# Patient Record
Sex: Male | Born: 1940 | Race: White | Hispanic: No | Marital: Married | Smoking: Former smoker
Health system: Southern US, Community
[De-identification: ages and names within clinical notes are randomized; demographics above are authoritative.]

## PROBLEM LIST (undated history)

## (undated) DIAGNOSIS — Z87442 Personal history of urinary calculi: Secondary | ICD-10-CM

## (undated) DIAGNOSIS — K760 Fatty (change of) liver, not elsewhere classified: Secondary | ICD-10-CM

## (undated) DIAGNOSIS — K635 Polyp of colon: Secondary | ICD-10-CM

## (undated) DIAGNOSIS — R0600 Dyspnea, unspecified: Secondary | ICD-10-CM

## (undated) DIAGNOSIS — K7689 Other specified diseases of liver: Secondary | ICD-10-CM

## (undated) DIAGNOSIS — N281 Cyst of kidney, acquired: Secondary | ICD-10-CM

## (undated) DIAGNOSIS — K56609 Unspecified intestinal obstruction, unspecified as to partial versus complete obstruction: Secondary | ICD-10-CM

## (undated) DIAGNOSIS — E042 Nontoxic multinodular goiter: Secondary | ICD-10-CM

## (undated) DIAGNOSIS — K648 Other hemorrhoids: Secondary | ICD-10-CM

## (undated) DIAGNOSIS — Z860101 Personal history of adenomatous and serrated colon polyps: Secondary | ICD-10-CM

## (undated) DIAGNOSIS — R109 Unspecified abdominal pain: Secondary | ICD-10-CM

## (undated) DIAGNOSIS — R600 Localized edema: Secondary | ICD-10-CM

## (undated) DIAGNOSIS — I2510 Atherosclerotic heart disease of native coronary artery without angina pectoris: Secondary | ICD-10-CM

## (undated) DIAGNOSIS — K5090 Crohn's disease, unspecified, without complications: Secondary | ICD-10-CM

## (undated) DIAGNOSIS — N200 Calculus of kidney: Secondary | ICD-10-CM

## (undated) DIAGNOSIS — I471 Supraventricular tachycardia, unspecified: Secondary | ICD-10-CM

## (undated) DIAGNOSIS — D751 Secondary polycythemia: Secondary | ICD-10-CM

## (undated) DIAGNOSIS — M1990 Unspecified osteoarthritis, unspecified site: Secondary | ICD-10-CM

## (undated) DIAGNOSIS — L539 Erythematous condition, unspecified: Secondary | ICD-10-CM

## (undated) DIAGNOSIS — K5790 Diverticulosis of intestine, part unspecified, without perforation or abscess without bleeding: Secondary | ICD-10-CM

## (undated) DIAGNOSIS — E782 Mixed hyperlipidemia: Secondary | ICD-10-CM

## (undated) DIAGNOSIS — D126 Benign neoplasm of colon, unspecified: Secondary | ICD-10-CM

## (undated) DIAGNOSIS — E559 Vitamin D deficiency, unspecified: Secondary | ICD-10-CM

## (undated) DIAGNOSIS — Z86010 Personal history of colonic polyps: Secondary | ICD-10-CM

## (undated) DIAGNOSIS — N476 Balanoposthitis: Secondary | ICD-10-CM

## (undated) DIAGNOSIS — R945 Abnormal results of liver function studies: Secondary | ICD-10-CM

## (undated) DIAGNOSIS — E785 Hyperlipidemia, unspecified: Secondary | ICD-10-CM

## (undated) DIAGNOSIS — I10 Essential (primary) hypertension: Secondary | ICD-10-CM

## (undated) DIAGNOSIS — K551 Chronic vascular disorders of intestine: Secondary | ICD-10-CM

## (undated) DIAGNOSIS — Z8546 Personal history of malignant neoplasm of prostate: Secondary | ICD-10-CM

## (undated) DIAGNOSIS — N183 Chronic kidney disease, stage 3 unspecified: Secondary | ICD-10-CM

## (undated) DIAGNOSIS — C61 Malignant neoplasm of prostate: Secondary | ICD-10-CM

## (undated) HISTORY — PX: RETROPUBIC PROSTATECTOMY: SUR1055

## (undated) HISTORY — PX: COLONOSCOPY WITH PROPOFOL: SHX5780

## (undated) HISTORY — DX: Diverticulosis of intestine, part unspecified, without perforation or abscess without bleeding: K57.90

## (undated) HISTORY — DX: Secondary polycythemia: D75.1

## (undated) HISTORY — DX: Malignant neoplasm of prostate: C61

## (undated) HISTORY — PX: APPENDECTOMY: SHX54

## (undated) HISTORY — DX: Other hemorrhoids: K64.8

## (undated) HISTORY — DX: Unspecified osteoarthritis, unspecified site: M19.90

## (undated) HISTORY — DX: Vitamin D deficiency, unspecified: E55.9

## (undated) HISTORY — DX: Abnormal results of liver function studies: R94.5

## (undated) HISTORY — DX: Fatty (change of) liver, not elsewhere classified: K76.0

## (undated) HISTORY — DX: Unspecified intestinal obstruction, unspecified as to partial versus complete obstruction: K56.609

## (undated) HISTORY — PX: PROSTATECTOMY: SHX69

## (undated) HISTORY — PX: LAPAROSCOPIC ASSISTED VENTRAL HERNIA REPAIR: SHX6312

## (undated) HISTORY — PX: EXPLORATORY LAPAROTOMY W/ BOWEL RESECTION: SHX1544

## (undated) HISTORY — DX: Polyp of colon: K63.5

## (undated) HISTORY — DX: Essential (primary) hypertension: I10

## (undated) HISTORY — PX: OTHER SURGICAL HISTORY: SHX169

## (undated) HISTORY — DX: Hyperlipidemia, unspecified: E78.5

## (undated) HISTORY — PX: VENTRAL HERNIA REPAIR: SHX424

## (undated) HISTORY — DX: Crohn's disease, unspecified, without complications: K50.90

## (undated) HISTORY — DX: Other specified diseases of liver: K76.89

## (undated) HISTORY — DX: Calculus of kidney: N20.0

## (undated) HISTORY — PX: TONSILLECTOMY: SUR1361

---
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
## 1978-10-15 HISTORY — PX: LITHOTRIPSY: SUR834

## 2001-10-15 DIAGNOSIS — Z8719 Personal history of other diseases of the digestive system: Secondary | ICD-10-CM

## 2001-10-15 HISTORY — PX: EXPLORATORY LAPAROTOMY W/ BOWEL RESECTION: SHX1544

## 2001-10-15 HISTORY — PX: APPENDECTOMY: SHX54

## 2001-10-15 HISTORY — DX: Personal history of other diseases of the digestive system: Z87.19

## 2002-05-20 ENCOUNTER — Encounter (INDEPENDENT_AMBULATORY_CARE_PROVIDER_SITE_OTHER): Payer: Self-pay | Admitting: Specialist

## 2002-05-20 ENCOUNTER — Encounter: Payer: Self-pay | Admitting: Internal Medicine

## 2002-05-20 ENCOUNTER — Inpatient Hospital Stay (HOSPITAL_COMMUNITY): Admission: RE | Admit: 2002-05-20 | Discharge: 2002-05-30 | Payer: Self-pay | Admitting: Gastroenterology

## 2002-05-21 ENCOUNTER — Encounter: Payer: Self-pay | Admitting: Gastroenterology

## 2002-05-25 ENCOUNTER — Encounter: Payer: Self-pay | Admitting: General Surgery

## 2002-11-29 ENCOUNTER — Encounter: Payer: Self-pay | Admitting: Internal Medicine

## 2002-11-30 ENCOUNTER — Encounter: Payer: Self-pay | Admitting: Surgery

## 2002-11-30 ENCOUNTER — Ambulatory Visit (HOSPITAL_COMMUNITY): Admission: RE | Admit: 2002-11-30 | Discharge: 2002-11-30 | Payer: Self-pay | Admitting: Surgery

## 2002-11-30 ENCOUNTER — Encounter: Payer: Self-pay | Admitting: Internal Medicine

## 2003-05-10 ENCOUNTER — Encounter: Payer: Self-pay | Admitting: Surgery

## 2003-05-13 ENCOUNTER — Inpatient Hospital Stay (HOSPITAL_COMMUNITY): Admission: RE | Admit: 2003-05-13 | Discharge: 2003-05-14 | Payer: Self-pay | Admitting: Surgery

## 2004-11-16 ENCOUNTER — Inpatient Hospital Stay (HOSPITAL_COMMUNITY): Admission: RE | Admit: 2004-11-16 | Discharge: 2004-11-19 | Payer: Self-pay | Admitting: Urology

## 2004-11-16 ENCOUNTER — Encounter (INDEPENDENT_AMBULATORY_CARE_PROVIDER_SITE_OTHER): Payer: Self-pay | Admitting: Specialist

## 2005-07-16 ENCOUNTER — Ambulatory Visit: Payer: Self-pay | Admitting: Internal Medicine

## 2006-07-01 ENCOUNTER — Ambulatory Visit: Payer: Self-pay | Admitting: Internal Medicine

## 2007-08-26 ENCOUNTER — Ambulatory Visit: Payer: Self-pay | Admitting: Internal Medicine

## 2007-08-26 LAB — CONVERTED CEMR LAB
Basophils Absolute: 0 10*3/uL (ref 0.0–0.1)
Eosinophils Absolute: 0.1 10*3/uL (ref 0.0–0.6)
Eosinophils Relative: 1.3 % (ref 0.0–5.0)
HCT: 37.8 % — ABNORMAL LOW (ref 39.0–52.0)
Lymphocytes Relative: 18.1 % (ref 12.0–46.0)
MCHC: 34.8 g/dL (ref 30.0–36.0)
MCV: 109.3 fL — ABNORMAL HIGH (ref 78.0–100.0)
Monocytes Relative: 6.8 % (ref 3.0–11.0)
Neutrophils Relative %: 72.9 % (ref 43.0–77.0)
Platelets: 203 10*3/uL (ref 150–400)
RBC: 3.46 M/uL — ABNORMAL LOW (ref 4.22–5.81)
RDW: 17.9 % — ABNORMAL HIGH (ref 11.5–14.6)

## 2008-01-14 ENCOUNTER — Encounter: Admission: RE | Admit: 2008-01-14 | Discharge: 2008-01-14 | Payer: Self-pay | Admitting: General Surgery

## 2008-01-14 ENCOUNTER — Encounter (INDEPENDENT_AMBULATORY_CARE_PROVIDER_SITE_OTHER): Payer: Self-pay | Admitting: *Deleted

## 2008-02-06 ENCOUNTER — Ambulatory Visit: Payer: Self-pay | Admitting: Internal Medicine

## 2008-02-11 ENCOUNTER — Encounter (INDEPENDENT_AMBULATORY_CARE_PROVIDER_SITE_OTHER): Payer: Self-pay | Admitting: *Deleted

## 2008-02-12 ENCOUNTER — Inpatient Hospital Stay (HOSPITAL_COMMUNITY): Admission: AD | Admit: 2008-02-12 | Discharge: 2008-02-13 | Payer: Self-pay | Admitting: Surgery

## 2008-02-26 ENCOUNTER — Telehealth: Payer: Self-pay | Admitting: Internal Medicine

## 2008-05-17 ENCOUNTER — Encounter: Payer: Self-pay | Admitting: Internal Medicine

## 2008-05-20 ENCOUNTER — Encounter: Payer: Self-pay | Admitting: Internal Medicine

## 2008-05-20 ENCOUNTER — Telehealth: Payer: Self-pay | Admitting: Internal Medicine

## 2008-05-31 ENCOUNTER — Ambulatory Visit: Payer: Self-pay | Admitting: Internal Medicine

## 2008-06-02 LAB — CONVERTED CEMR LAB
Bilirubin, Direct: 0.2 mg/dL (ref 0.0–0.3)
Total Bilirubin: 1.5 mg/dL — ABNORMAL HIGH (ref 0.3–1.2)
Total Protein: 6.4 g/dL (ref 6.0–8.3)

## 2008-07-15 ENCOUNTER — Encounter: Admission: RE | Admit: 2008-07-15 | Discharge: 2008-07-15 | Payer: Self-pay | Admitting: Surgery

## 2008-07-15 ENCOUNTER — Encounter: Payer: Self-pay | Admitting: Internal Medicine

## 2008-07-28 DIAGNOSIS — K5730 Diverticulosis of large intestine without perforation or abscess without bleeding: Secondary | ICD-10-CM | POA: Insufficient documentation

## 2008-07-28 DIAGNOSIS — Z8719 Personal history of other diseases of the digestive system: Secondary | ICD-10-CM | POA: Insufficient documentation

## 2008-07-28 DIAGNOSIS — M109 Gout, unspecified: Secondary | ICD-10-CM | POA: Insufficient documentation

## 2008-07-28 DIAGNOSIS — Z8546 Personal history of malignant neoplasm of prostate: Secondary | ICD-10-CM | POA: Insufficient documentation

## 2008-07-28 DIAGNOSIS — I10 Essential (primary) hypertension: Secondary | ICD-10-CM | POA: Insufficient documentation

## 2008-07-28 DIAGNOSIS — E785 Hyperlipidemia, unspecified: Secondary | ICD-10-CM | POA: Insufficient documentation

## 2008-07-28 DIAGNOSIS — K5090 Crohn's disease, unspecified, without complications: Secondary | ICD-10-CM | POA: Insufficient documentation

## 2008-07-28 DIAGNOSIS — Z87442 Personal history of urinary calculi: Secondary | ICD-10-CM | POA: Insufficient documentation

## 2008-07-28 DIAGNOSIS — C61 Malignant neoplasm of prostate: Secondary | ICD-10-CM

## 2008-08-03 ENCOUNTER — Ambulatory Visit: Payer: Self-pay | Admitting: Internal Medicine

## 2008-08-10 LAB — CONVERTED CEMR LAB
AST: 44 units/L — ABNORMAL HIGH (ref 0–37)
Monocytes Relative: 11.2 % (ref 3.0–12.0)
Neutro Abs: 6 10*3/uL (ref 1.4–7.7)
Neutrophils Relative %: 71.3 % (ref 43.0–77.0)
RBC: 4.99 M/uL (ref 4.22–5.81)
Total Protein: 7 g/dL (ref 6.0–8.3)

## 2008-09-03 ENCOUNTER — Ambulatory Visit: Payer: Self-pay | Admitting: Internal Medicine

## 2008-09-03 LAB — CONVERTED CEMR LAB
AST: 48 units/L — ABNORMAL HIGH (ref 0–37)
Albumin: 3.5 g/dL (ref 3.5–5.2)
Bilirubin, Direct: 0.2 mg/dL (ref 0.0–0.3)
Eosinophils Absolute: 0.1 10*3/uL (ref 0.0–0.7)
HCT: 49.7 % (ref 39.0–52.0)
Lymphocytes Relative: 20.4 % (ref 12.0–46.0)
MCHC: 34.4 g/dL (ref 30.0–36.0)
MCV: 98.5 fL (ref 78.0–100.0)
Monocytes Relative: 10.8 % (ref 3.0–12.0)
Neutro Abs: 4 10*3/uL (ref 1.4–7.7)
Platelets: 129 10*3/uL — ABNORMAL LOW (ref 150–400)
RBC: 5.05 M/uL (ref 4.22–5.81)
Total Bilirubin: 1.6 mg/dL — ABNORMAL HIGH (ref 0.3–1.2)

## 2008-09-06 ENCOUNTER — Ambulatory Visit: Payer: Self-pay | Admitting: Internal Medicine

## 2008-09-22 ENCOUNTER — Ambulatory Visit: Payer: Self-pay | Admitting: Internal Medicine

## 2008-09-22 ENCOUNTER — Encounter: Payer: Self-pay | Admitting: Internal Medicine

## 2008-09-24 DIAGNOSIS — Z862 Personal history of diseases of the blood and blood-forming organs and certain disorders involving the immune mechanism: Secondary | ICD-10-CM | POA: Insufficient documentation

## 2008-09-24 DIAGNOSIS — Z8639 Personal history of other endocrine, nutritional and metabolic disease: Secondary | ICD-10-CM

## 2008-09-24 LAB — CONVERTED CEMR LAB
ALT: 39 units/L (ref 0–53)
AST: 42 units/L — ABNORMAL HIGH (ref 0–37)
Albumin: 3.6 g/dL (ref 3.5–5.2)
Total Protein: 7.4 g/dL (ref 6.0–8.3)

## 2008-09-29 ENCOUNTER — Ambulatory Visit (HOSPITAL_COMMUNITY): Admission: RE | Admit: 2008-09-29 | Discharge: 2008-09-29 | Payer: Self-pay | Admitting: Internal Medicine

## 2008-09-30 ENCOUNTER — Encounter: Payer: Self-pay | Admitting: Internal Medicine

## 2008-10-05 ENCOUNTER — Encounter: Payer: Self-pay | Admitting: Internal Medicine

## 2008-11-03 ENCOUNTER — Ambulatory Visit: Payer: Self-pay | Admitting: Internal Medicine

## 2008-11-03 DIAGNOSIS — D751 Secondary polycythemia: Secondary | ICD-10-CM | POA: Insufficient documentation

## 2008-11-29 ENCOUNTER — Encounter: Payer: Self-pay | Admitting: Internal Medicine

## 2008-12-16 ENCOUNTER — Ambulatory Visit: Payer: Self-pay | Admitting: Internal Medicine

## 2008-12-17 LAB — CONVERTED CEMR LAB
ALT: 32 units/L (ref 0–53)
Albumin: 3.4 g/dL — ABNORMAL LOW (ref 3.5–5.2)
Bilirubin, Direct: 0.2 mg/dL (ref 0.0–0.3)
Total Protein: 6.6 g/dL (ref 6.0–8.3)

## 2009-03-22 ENCOUNTER — Ambulatory Visit: Payer: Self-pay | Admitting: Internal Medicine

## 2009-04-26 ENCOUNTER — Ambulatory Visit: Payer: Self-pay | Admitting: Internal Medicine

## 2009-06-07 ENCOUNTER — Encounter: Payer: Self-pay | Admitting: Cardiology

## 2009-07-15 LAB — HM COLONOSCOPY

## 2009-08-01 ENCOUNTER — Ambulatory Visit: Payer: Self-pay | Admitting: Internal Medicine

## 2009-08-01 ENCOUNTER — Ambulatory Visit (HOSPITAL_COMMUNITY): Admission: RE | Admit: 2009-08-01 | Discharge: 2009-08-01 | Payer: Self-pay | Admitting: Internal Medicine

## 2009-08-01 ENCOUNTER — Ambulatory Visit: Payer: Self-pay

## 2009-08-01 ENCOUNTER — Encounter: Payer: Self-pay | Admitting: Family Medicine

## 2009-09-28 ENCOUNTER — Ambulatory Visit: Payer: Self-pay | Admitting: Internal Medicine

## 2009-09-28 LAB — CONVERTED CEMR LAB
ALT: 23 units/L (ref 0–53)
AST: 31 units/L (ref 0–37)
Albumin: 3 g/dL — ABNORMAL LOW (ref 3.5–5.2)
Alkaline Phosphatase: 50 units/L (ref 39–117)
Bilirubin, Direct: 0.3 mg/dL (ref 0.0–0.3)
Total Bilirubin: 2 mg/dL — ABNORMAL HIGH (ref 0.3–1.2)

## 2009-09-30 LAB — CONVERTED CEMR LAB
Alkaline Phosphatase: 50 units/L (ref 39–117)
Total Bilirubin: 1.9 mg/dL — ABNORMAL HIGH (ref 0.3–1.2)

## 2009-10-19 ENCOUNTER — Encounter: Payer: Self-pay | Admitting: Cardiology

## 2009-11-02 ENCOUNTER — Encounter: Payer: Self-pay | Admitting: Internal Medicine

## 2009-11-11 ENCOUNTER — Telehealth: Payer: Self-pay | Admitting: Internal Medicine

## 2009-11-28 ENCOUNTER — Ambulatory Visit: Payer: Self-pay | Admitting: Internal Medicine

## 2009-12-02 ENCOUNTER — Ambulatory Visit: Payer: Self-pay | Admitting: Cardiology

## 2010-03-07 ENCOUNTER — Encounter: Payer: Self-pay | Admitting: Internal Medicine

## 2010-03-13 IMAGING — CT CT ABD-PELV W/O CM
2 of 4 series · 17 of 46 positions shown, 19 images · non-contrast
Comparison: 07/15/2008

CLINICAL DATA: Crohn's disease.  History of small bowel
obstruction.  Renal insufficiency.

CT ABDOMEN AND PELVIS WITHOUT CONTRAST
TECHNIQUE: Multidetector CT imaging of the abdomen and pelvis was
performed following the standard protocol without intravenous
contrast.

[Series 2: ap without · axial · non-contrast · 0.90mm/px · z∈[-461,-1]mm · 14 of 102 slices shown, 16 images]
[im 5/102  soft-tissue]
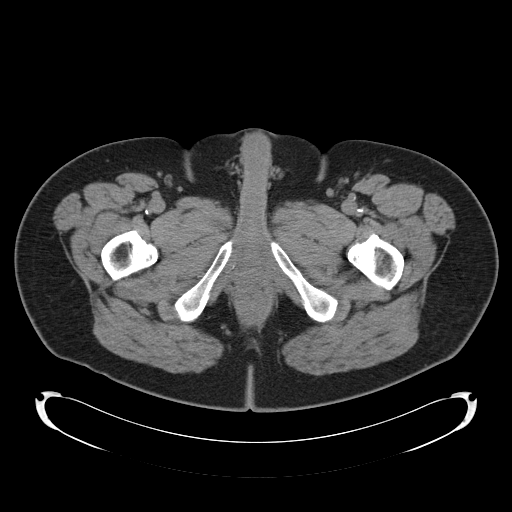
[im 5/102  bone]
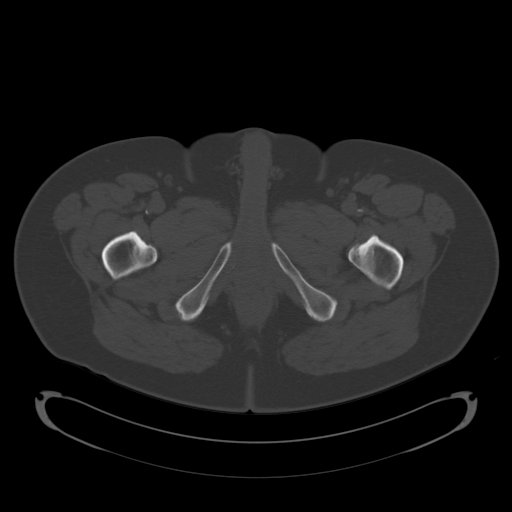
[im 14/102  soft-tissue]
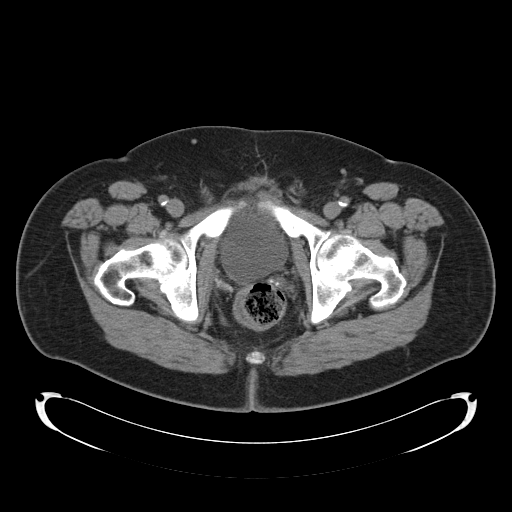
[im 18/102  soft-tissue]
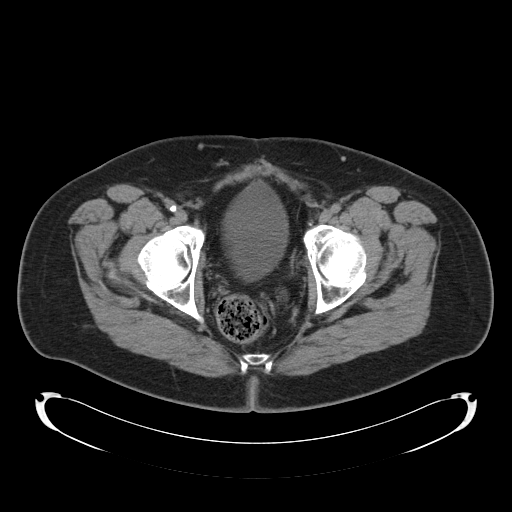
[im 27/102  soft-tissue]
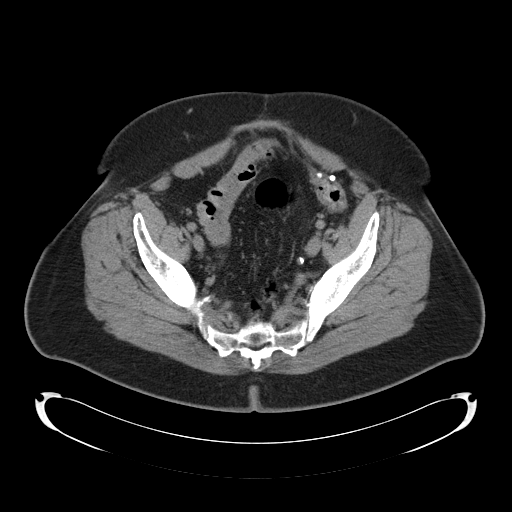
[im 36/102  soft-tissue]
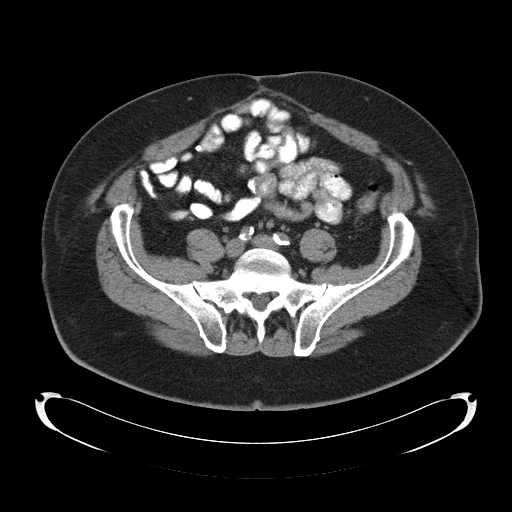
[im 40/102  soft-tissue]
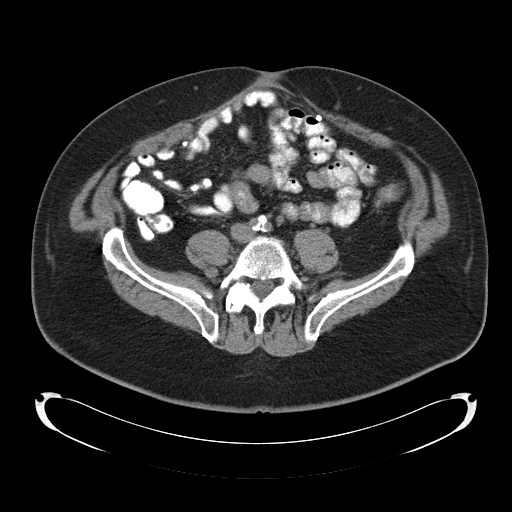
[im 49/102  soft-tissue]
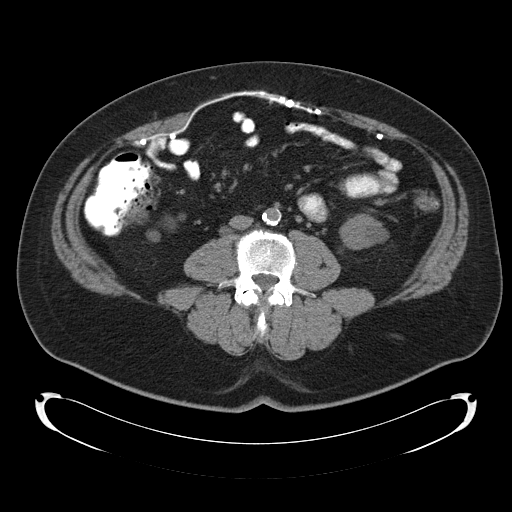
[im 53/102  soft-tissue]
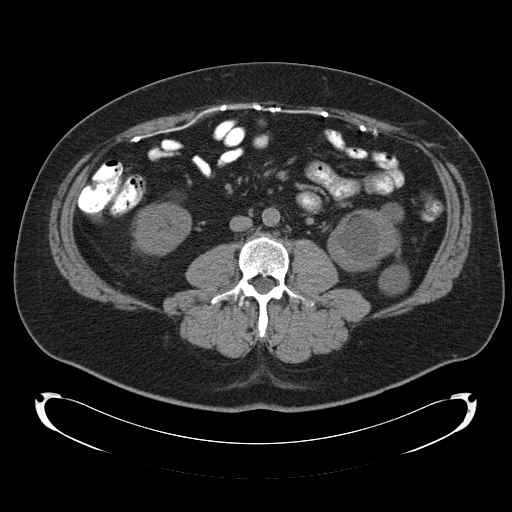
[im 62/102  soft-tissue]
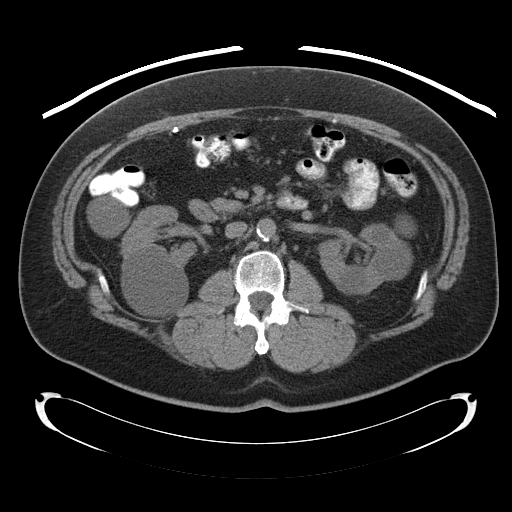
[im 62/102  bone]
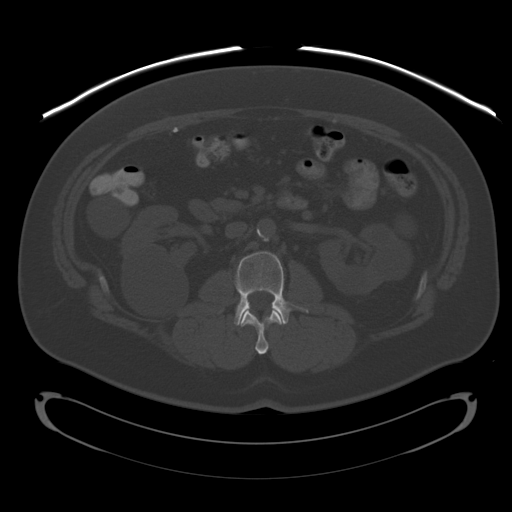
[im 66/102  soft-tissue]
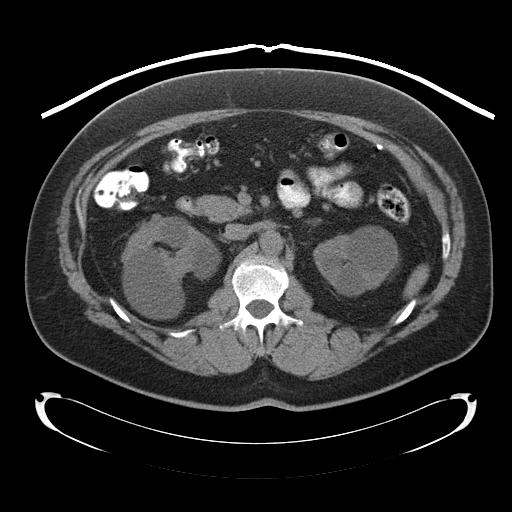
[im 75/102  soft-tissue]
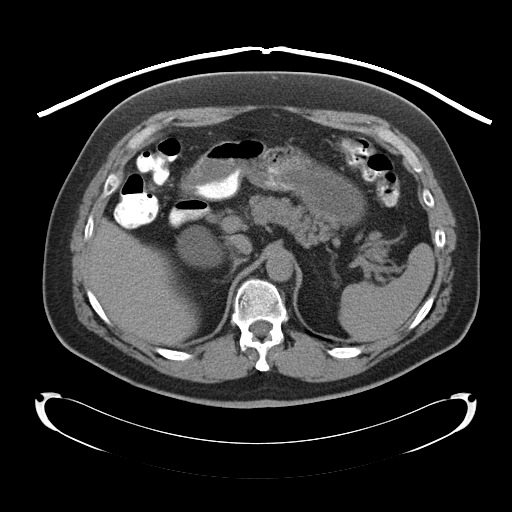
[im 84/102  soft-tissue]
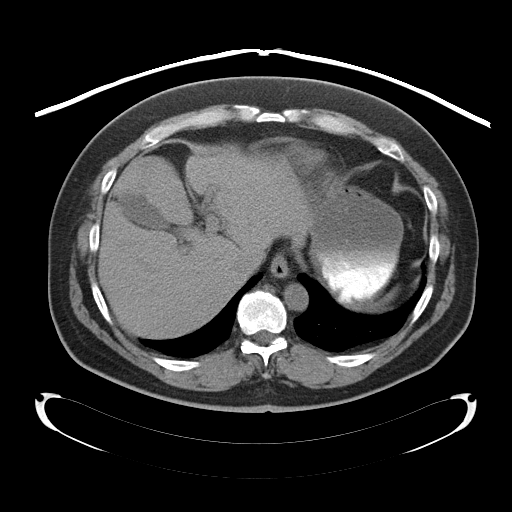
[im 88/102  soft-tissue]
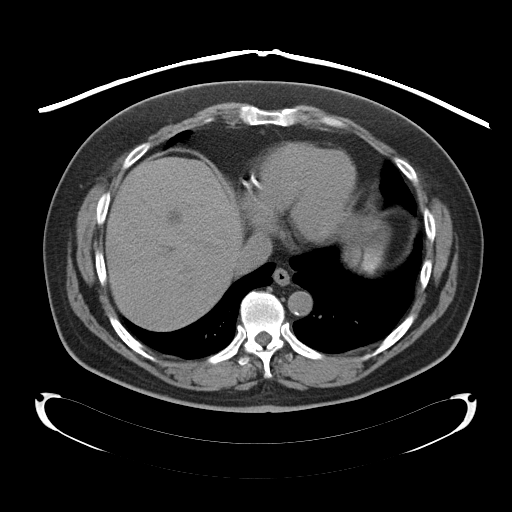
[im 97/102  soft-tissue]
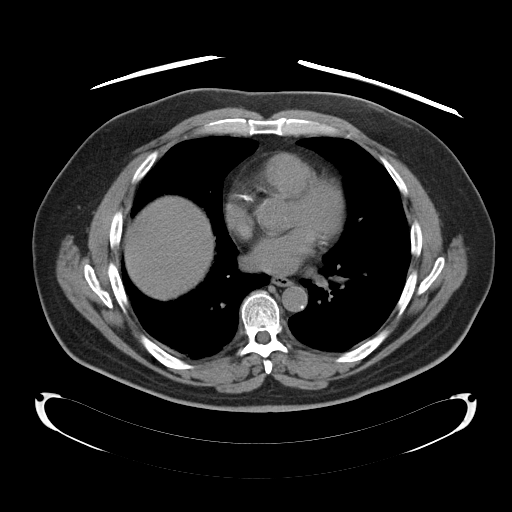

[Series 602: <mpr range> · coronal · 1.02mm/px · 3 of 168 slices shown]
[im 56/168  soft-tissue]
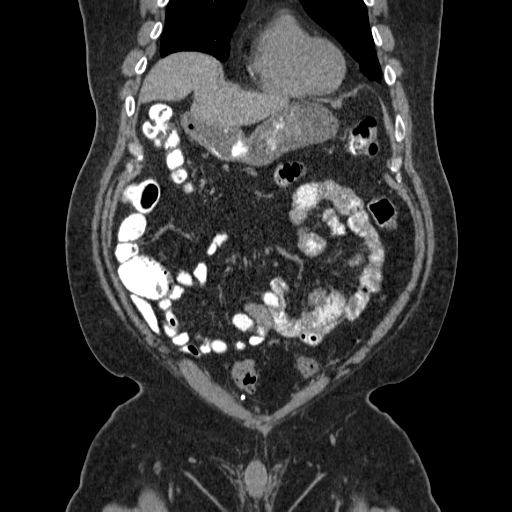
[im 75/168  soft-tissue]
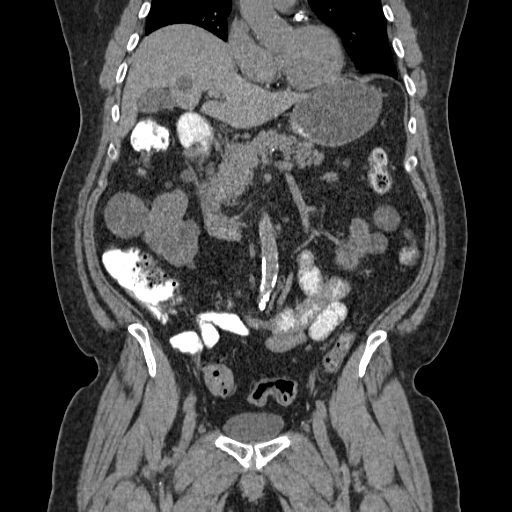
[im 93/168  soft-tissue]
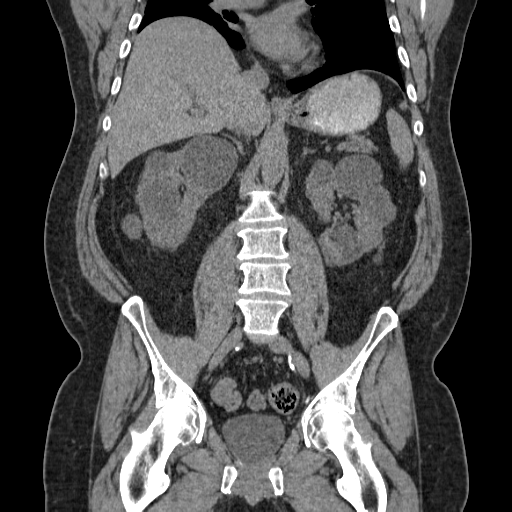

[17 of 46 positions shown; findings below may reference images not displayed]

FINDINGS: Two small liver cysts are unchanged.  The stomach,
duodenum, the pancreas, and adrenal glands are normal.  Tiny
calcified stone is seen in the fundus of the gallbladder.  Multiple
bilateral renal cysts are again identified without substantial
change.

No abdominal aortic aneurysm.  Ventral mesh is unchanged.  No free
fluid in the peritoneal cavity.  No intraperitoneal or
retroperitoneal lymphadenopathy.  No evidence for bowel
obstruction.

Despite the reported history of Crohn's disease, there is no
evidence for small bowel wall thickening.  The colon is
unremarkable aside from some scattered diverticulosis in the
sigmoid segment.

Bone windows reveal no worrisome lytic or sclerotic osseous
lesions.
IMPRESSION: Stable exam without new or acute interval findings.  Cysts are
identified in the liver and in both kidneys.  No bowel obstruction
or abnormal bowel wall thickening.  No intraperitoneal free fluid
or mesenteric edema.

## 2010-03-15 ENCOUNTER — Telehealth: Payer: Self-pay | Admitting: Internal Medicine

## 2010-03-27 ENCOUNTER — Telehealth: Payer: Self-pay | Admitting: Internal Medicine

## 2010-03-31 ENCOUNTER — Encounter (INDEPENDENT_AMBULATORY_CARE_PROVIDER_SITE_OTHER): Payer: Self-pay | Admitting: *Deleted

## 2010-05-04 ENCOUNTER — Ambulatory Visit: Payer: Self-pay | Admitting: Internal Medicine

## 2010-08-09 ENCOUNTER — Encounter: Admission: RE | Admit: 2010-08-09 | Discharge: 2010-08-09 | Payer: Self-pay | Admitting: General Surgery

## 2010-08-09 ENCOUNTER — Other Ambulatory Visit: Admission: RE | Admit: 2010-08-09 | Discharge: 2010-08-09 | Payer: Self-pay | Admitting: Diagnostic Radiology

## 2010-11-14 NOTE — Progress Notes (Signed)
Summary: TRIAGE-Abd. Pain   Phone Note Call from Patient Call back at [facility] Phone [phone]   Caller: Patient Call For: Dr. Olevia Perches Summary of Call: Pt is having a flare-up with his Crohn's. Initial call taken by: Webb Laws,  November 11, 2009 1:08 PM  Follow-up for Phone Call        On Wednesday he had an episode of severe abd. pain, mild pain today, constipation-no BM since Wed., low grade temp, but not today, some nausea and vomited x1 on wed. Denies blood. Takes 54m 25 mg daily.  Takes Hydrocodone as needed and started Cipro 2569mtwo times a day on Wednesday.   1) Full liquids x24 hours. Advance to soft,bland diet x2-4 days. Advanced as tolerated. 2) Continue above meds, try to alternate Hydrocodone w/ Tylenol 3) Milk of magnesia now. Glass of warm prune juice at bedtime.If no BM by the morning, then repeat dose of MOM. 4) Heating pad to abdomen as needed. 5) F/U w/Dr.Jodine Muchmore on 11-28-09 at 10am 6) If symptoms become worse call back immediately or go to ER. 7) I will call pt., if new orders, after MD reviews.    Follow-up by: DeVivia EwingPN,  January 28, 207681:56 PM  Additional Follow-up for Phone Call Additional follow up Details #1::        reviewed and agree. Cipro 250 mg by mouth two times a day. Additional Follow-up by: DoLafayette DragonD,  November 11, 2009 9:57 PM

## 2010-11-14 NOTE — Progress Notes (Signed)
Summary: VB12   ---- Converted from flag ---- ---- 03/15/2010 1:21 PM, Lafayette Dragon MD wrote: Hilda Blades, Mr Barcomb's B12 is  at the low limits of normal. Please , make sure he has been taking his B12 monthly. He should go weekly x 4 weeks, then monthly. Repeat 6 months. ------------------------------  Phone Note Outgoing Call Call back at [facility] Phone [phone]   Call placed by: Vivia Ewing LPN,  March 15, 7182 6:72 PM Call placed to: Patient Summary of Call: Above MD orders reviewed with patient.  Pt. wants to get VB12 injections at [facility] office [place]. I have spoken with Dr.Moore's nurse, Joaquim Lai, and given her the instructions by phone. Pt. advised to go to the office tomorrow for his 1st shot. Pt. instructed to call back as needed.  Initial call taken by: Vivia Ewing LPN,  March 15, 5499 1:64 PM

## 2010-11-14 NOTE — Letter (Signed)
Summary: Office Visit Letter  [facility]  [address] [place], [place] [address]   Phone: [phone]  Fax: [phone]      March 31, 2010 MRN: [id number]   STEWARD SAMES [address] [place] [place], [place]  [address]   Dear Mr. Guldin,   According to our records, it is time for you to schedule a follow-up office visit with [place].   At your convenience, please call [phone] (option #2)to schedule an office visit. If you have any questions, concerns, or feel that this letter is in error, we would appreciate your call.   Sincerely,  Lowella Bandy. Olevia Perches, M.D.  [facility] [phone]

## 2010-11-14 NOTE — Assessment & Plan Note (Signed)
Summary: annual f/u...as.    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 Biopsies of the terminal ileum showed ileitis. He has intermittent partial small bowel obstructions which occur every 3-4 months. They usually resolve with one hydrocodone and bowel rest. A CT scan of the abdomen and pelvis in February 2011 did not show any signs of obstruction. He has been on a low residue diet. His vitamin B12 level was low at 242 on May 2011 on blood work from Dr. Laurance Flatten. He has been on B12 supplements 1000 mcg IM monthly and we are planning to recheck the B12 level in 6 months. He is on 6MP 25 mg daily. His hemoglobin has been normal but his platelet count has been low at 113,000. He has a white cell count of 5300 most likely related to the 6 MP. He has 2 loose bowel movements a day. His weight has steadily increased. He is followed for prostate cancer by Dr.Wrenn.   GI Review of Systems    Reports abdominal pain.      Denies acid reflux, belching, bloating, chest pain, dysphagia with liquids, dysphagia with solids, heartburn, loss of appetite, nausea, vomiting, vomiting blood, weight loss, and  weight gain.        Denies anal fissure, black tarry stools, change in bowel habit, constipation, diarrhea, diverticulosis, fecal incontinence, heme positive stool, hemorrhoids, irritable bowel syndrome, jaundice, light color stool, liver problems, rectal bleeding, and  rectal pain.    Current Medications (verified): 1)  Benicar Hct 40-12.5 Mg Tabs (Olmesartan Medoxomil-Hctz) .... Take 1 Tablet By Mouth Once A  Day 2)  Aspirin 81 Mg Tbec (Aspirin) .... Take 1 Tablet By Mouth Once A Day 3)  Lipitor 10 Mg Tabs (Atorvastatin Calcium) .... One Tablet By Mouth Once Daily 4)  Mercaptopurine 50 Mg Tabs (Mercaptopurine) .... Take 1/2  Tablet By Mouth Once A Day 5)  Hydrocodone-Acetaminophen 10-650 Mg Tabs (Hydrocodone-Acetaminophen) .... Take 1 Tablet By Mouth Eveyr 4-6 Hours As Needed For Severe Pain 6)  Fish Oil  Oil (Fish Oil) .... 4000 Units Per Day    2 in The Am and 2 in The Pm 7)  Vitamin D 1000 Unit Tabs (Cholecalciferol) .Marland Kitchen.. 1 By Mouth Once Daily 8)  Cyanocobalamin 1000 Mcg/ml Soln (Cyanocobalamin) .Marland Kitchen.. 1 Injection Monthy  Allergies (verified): 1)  ! * Colchicine  Past History:  Past Medical History: Last updated: 07/28/2008 Current Problems:  PROSTATE CANCER, PERSONAL HX (ICD-V10.46) HYPERLIPIDEMIA (ICD-272.4) HYPERTENSION (ICD-401.9) DIVERTICULOSIS, COLON (ICD-562.10) NEPHROLITHIASIS, HX OF (ICD-V13.01) GOUT, UNSPECIFIED (ICD-274.9) SMALL BOWEL OBSTRUCTION, HX OF (ICD-V12.79) CROHN'S DISEASE (ICD-555.9)  Past Surgical History: Last updated: 07/28/2008 Tonsillectomy Ventral Hernia Repair Lithotripsy exploratory laparotomy with small bowel resection  Family History: Last updated: 04/26/2009 Family History of Heart Disease: Mother, Father, Maternal Uncle Family History of Cervical Cancer: Mother No FH of Colon Cancer:  Social History: Last updated: 11/03/2008 Patient is a former smoker.  Alcohol Use - no Illicit Drug Use - no Patient gets regular exercise.  Review of Systems       The patient complains of arthritis/joint pain.  The patient denies allergy/sinus, anemia, anxiety-new,  back pain, blood in urine, breast changes/lumps, change in vision, confusion, cough, coughing up blood, depression-new, fainting, fatigue, fever, headaches-new, hearing problems, heart murmur, heart rhythm changes, itching, menstrual pain, muscle pains/cramps, night sweats, nosebleeds, pregnancy  symptoms, shortness of breath, skin rash, sleeping problems, sore throat, swelling of feet/legs, swollen lymph glands, thirst - excessive , urination - excessive , urination changes/pain, urine leakage, vision changes, and voice change.         Pertinent positive and negative review of systems were noted in the above HPI. All other ROS was otherwise negative.   Vital Signs:  Patient profile:   70 year old male Height:      69 inches Weight:      235 pounds BMI:     34.83 BSA:     2.21 Pulse rate:   76 / minute Pulse rhythm:   regular BP sitting:   124 / 64  (left arm)  Vitals Entered By: [place] ([place]) (May 04, 2010 8:30 AM)  Physical Exam  General:  Alert, oriented and overweight. Eyes:  Nonicteric. Neck:  Supple; no masses or thyromegaly. Lungs:  Clear throughout to auscultation. Heart:  Regular rate and rhythm; no murmurs, rubs,  or bruits. Abdomen:  soft obese abdomen which is nontender and has normoactive bowel sounds. There is a well-healed vertical surgical scar below the umbilicus. There is no fluid wave. No palpable mass. Extremities:  No clubbing, cyanosis, edema or deformities noted. Skin:  Intact without significant lesions or rashes.   Impression & Recommendations:  Problem # 1:  CROHN'S DISEASE (ICD-555.9) Patient has Crohn's disease at the ileocolic anastomosis and is status post terminal ileal resection in 2003. He has episodes of selflimited  partial small bowel obstructions which usually resolve spontaneously without radiographic evidence of obstruction. He will continue on 6-MP and a low-residue diet. His blood tests have been monitored by Dr. Laurance Flatten. I will see him in 6 months.  Problem # 2:  LIVER FUNCTION TESTS, ABNORMAL, HX OF (ICD-V12.2) Patient's last liver function tests were normal with the exception of a mild elevation of total bilirubin at 1.4.His liver looked normal on recent CT scan, also  spleen was normal size. 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 He is followed by Dr.Wrenn.  Problem # 4:  DIVERTICULOSIS, COLON (ICD-562.10) Patient will be due for a recall colonoscopy in December 2012.  Patient Instructions: 1)  Continue 6 MP 25 mg daily. 2)  Low residue diet. 3)  Recall colonoscopy, December 2013. 4)  Continue Vitamin B12 1,000 mcg IM monthly then we will repeat B12 levels in January 2012. 5)  Followup with Dr. Laurance Flatten for general medical care and blood tests. 6)  Copy sent to : Dr Morrie Sheldon 7)  The medication list was reviewed and reconciled.  All changed / newly prescribed medications were explained.  A complete medication list was provided to the patient / caregiver.

## 2010-11-14 NOTE — Progress Notes (Signed)
Summary: med ?'s   Phone Note Call from Patient Call back at [facility] Phone [phone]   Caller: Patient Call For: Dr. Olevia Perches Reason for Call: Talk to Nurse Summary of Call: questions regarding meds for GOUT Initial call taken by: Lucien Mons,  March 27, 2010 12:03 PM  Follow-up for Phone Call        Patient has been advised that he should follow up with his primary care physician regarding his gout. Dr Kizzie Furnish prescribed indomethacin as an alternative for allopurinol since it interferes with 38m. However, he needs to discuss his gout with his PCP and advise him of the interaction potential for allopurinol and 663mand let him know that indomethacin should be okay for him to take. I have explained to him that his PCP would probably like to follow up on his uric acid count etc. Patient states he will do that. Follow-up by: DoMadlyn FrankelMA (ADeborra Medina  March 27, 2010 12:33 PM

## 2010-11-14 NOTE — Assessment & Plan Note (Signed)
Summary: F/U FROM CROHN'S FLARE              [place]    History of Present Illness Visit Type: follow up  Primary GI MD: [name] MD Primary Provider: Redge Gainer, MD Requesting Provider: n/a Chief Complaint: F/u from crohns flare. Pt denies any GI complaints  History of Present Illness:   This is a 70 year old white male with Crohn's disease at the ileocolic anastomosis. He is status post terminal ileal resection in 2003 for an obstruction. His last colonoscopy was in December 2007. He has had an episode of severe abdominal pain which lasted 12 hours and resolved spontaneously. He had one episode of vomiting and did not have any bowel movements for several days. following the acute episode. He is now back to normal. There were 2 similar episodes in the last year. Patient is status post ventral hernia repair in April 2009 with placement of mesh. The biopsies from the terminal ileum showed ileitis.   GI Review of Systems      Denies abdominal pain, acid reflux, belching, bloating, chest pain, dysphagia with liquids, dysphagia with solids, heartburn, loss of appetite, nausea, vomiting, vomiting blood, weight loss, and  weight gain.        Denies anal fissure, black tarry stools, change in bowel habit, constipation, diarrhea, diverticulosis, fecal incontinence, heme positive stool, hemorrhoids, irritable bowel syndrome, jaundice, light color stool, liver problems, rectal bleeding, and  rectal pain.    Current Medications (verified): 1)  Benicar Hct 40-12.5 Mg Tabs (Olmesartan Medoxomil-Hctz) .... Take 1 Tablet By Mouth Once A Day 2)  Aspirin 81 Mg Tbec (Aspirin) .... Take 1 Tablet By Mouth Once A Day 3)  Lipitor 10 Mg Tabs (Atorvastatin Calcium) .... One Tablet By Mouth Once Daily 4)  Mercaptopurine 50 Mg Tabs (Mercaptopurine) .... Take 1/2 Tablet By Mouth Once Daily  Allergies (verified): 1)  ! * Colchicine  Past History:  Past Medical History: Reviewed history from 07/28/2008  and no changes required. Current Problems:  PROSTATE CANCER, PERSONAL HX (ICD-V10.46) HYPERLIPIDEMIA (ICD-272.4) HYPERTENSION (ICD-401.9) DIVERTICULOSIS, COLON (ICD-562.10) NEPHROLITHIASIS, HX OF (ICD-V13.01) GOUT, UNSPECIFIED (ICD-274.9) SMALL BOWEL OBSTRUCTION, HX OF (ICD-V12.79) CROHN'S DISEASE (ICD-555.9)  Past Surgical History: Reviewed history from 07/28/2008 and no changes required. Tonsillectomy Ventral Hernia Repair Lithotripsy exploratory laparotomy with small bowel resection  Family History: Reviewed history from 04/26/2009 and no changes required. Family History of Heart Disease: Mother, Father, Maternal Uncle Family History of Cervical Cancer: Mother No FH of Colon Cancer:  Social History: Reviewed history from 11/03/2008 and no changes required. Patient is a former smoker.  Alcohol Use - no Illicit Drug Use - no Patient gets regular exercise.  Review of Systems  The patient denies allergy/sinus, anemia, anxiety-new, arthritis/joint pain, back pain, blood in urine, breast changes/lumps, change in vision, confusion, cough, coughing up blood, depression-new, fainting, fatigue, fever, headaches-new, hearing problems, heart murmur, heart rhythm changes, itching, muscle pains/cramps, night sweats, nosebleeds, shortness of breath, skin rash, sleeping problems, sore throat, swelling of feet/legs, swollen lymph glands, thirst - excessive, urination - excessive, urination changes/pain, urine leakage, vision changes, and voice change.         Pertinent positive and negative review of systems were noted in the above HPI. All other ROS was otherwise negative.   Vital Signs:  Patient profile:   70 year old male Height:      69 inches Weight:      227 pounds BMI:     33.64 BSA:  2.18 Pulse rate:   64 / minute Pulse rhythm:   regular BP sitting:   124 / 72  (left arm) Cuff size:   regular  Vitals Entered By: [place] (November 28, 2009 9:45 AM)  Physical  Exam  General:  mildly overweight, alert, oriented. Eyes:  PERRLA, no icterus. Mouth:  No deformity or lesions, dentition normal. Neck:  Supple; no masses or thyromegaly. Lungs:  Clear throughout to auscultation. Heart:  Regular rate and rhythm; no murmurs, rubs,  or bruits. Abdomen:  soft obese abdomen with minimal tenderness around the umbilical area and well-healed surgical scar with minimal weakening of the abdominal wall but no definite hernia. Bowel sounds are normoactive. Liver edge at costal margin. Extremities:  No clubbing, cyanosis, edema or deformities noted. Skin:  Intact without significant lesions or rashes. Psych:  Alert and cooperative. Normal mood and affect.   Impression & Recommendations:  Problem # 1:  LIVER FUNCTION TESTS, ABNORMAL, HX OF (ICD-V12.2) Patient had normal liver function tests as of January 21st of this year.  Problem # 2:  SMALL BOWEL OBSTRUCTION, HX OF (ICD-V12.79) Patient's abdominal pain is most likely due to a partial small bowel obstruction. This was a third episode in the last year. It resolved spontaneously. I have instructed the patient on a low fiber diet. He is to use small frequent feedings and avoid high roughage foods. He is due for a repeat colonoscopy in December 2012.Obtain CT scan of the abdomen.  Problem # 3:  CROHN'S DISEASE (ICD-555.9) Patient has an active disease at the ileocolic anastomosis. Patient will continue on 6-MP 50 mg daily and a low residue diet. We will do a CT Scan of the abdomen and pelvis to look for Crohn's disease. Orders: TLB-BUN (Urea Nitrogen) ([id number]-BUN) TLB-Creatinine, Blood ([id number]-CREA)  Patient Instructions: 1)  Renal function today to repeat creatinine which was 1.6 to see if we can use IV contrast. 2)  CT Scan of the abdomen and pelvis to rule out Crohn's disease or small bowel obstruction. 3)  Low residue diet with small frequent feedings. 4)  Continue 6 MP 50 mg daily. 5)  Refill on his pain  medications to use p.r.n. for bowel obstruction. 6)  Copy sent to : Dr Morrie Sheldon 7)  The medication list was reviewed and reconciled.  All changed / newly prescribed medications were explained.  A complete medication list was provided to the patient / caregiver. Prescriptions: HYDROCODONE-ACETAMINOPHEN 10-650 MG TABS (HYDROCODONE-ACETAMINOPHEN) Take 1 tablet by mouth eveyr 4-6 hours as needed for severe pain  #20 x 0   Entered by:   Awilda Bill CMA ([place])   Authorized by:   Lafayette Dragon MD   Signed by:   Awilda Bill CMA ([place]) on 11/28/2009   Method used:   Printed then faxed to ...       [facility] [phone]* (retail)       [place], [place]  [address]       Ph: [phone] or [phone]       Fax: [phone]   RxID:   [id number] MERCAPTOPURINE 50 MG TABS (MERCAPTOPURINE) Take 1 tablet by mouth once a day  #90 x 3   Entered by:   Awilda Bill CMA ([place])   Authorized by:   Lafayette Dragon MD   Signed by:   Awilda Bill CMA ([place]) on 11/28/2009  Method used:   Electronically to        [facility] [phone]* (retail)       [address]       [place], [place]  [address]       Ph: [phone] or [phone]       Fax: [phone]   RxID:   [id number]

## 2010-11-14 NOTE — Op Note (Signed)
Summary: Lysis of Adhesions   NAME:  Luis Deleon, Luis Deleon                ACCOUNT NO.:  [id number]      MEDICAL RECORD NO.:  [id number]          PATIENT TYPE:  AMB      LOCATION:  DAY                          FACILITY:  [facility]      PHYSICIAN:  Adin Hector, MD     DATE OF BIRTH:  1941-01-09      DATE OF PROCEDURE:  02/11/2008   DATE OF DISCHARGE:                                  OPERATIVE REPORT      PRIMARY CARE PHYSICIAN:  Dr. Dennard Schaumann of  [facility]   and also followed by Chipper Herb, M.D. in [place], [place]   and Dr. [name] in [place], [place] with gastroenterology.      SURGEON:  Adin Hector, MD      ASSISTANT:  None.      PREOPERATIVE DIAGNOSES:  1. Abdominal pain.   2. Prior ventral hernia repair, questions of hernia recurrence.      POSTOPERATIVE DIAGNOSES:   1. Numerous interloop and anterior abdominal wall small bowel and       omental adhesions.   2. Recurrent ventral hernia.      PROCEDURE PERFORMED:   1. Laparoscopic lysis of adhesions x2-1/2 hours, which was two thirds       of the case.   2. Laparoscopic ventral hernia repair with 20 x 30 cm dual sided mesh       (Proceed equals ultra light weight polypropylene/cellulose       nonadherent barrier dual sided mesh).      SPECIMEN:  None.      DRAINS:  None.      ESTIMATED BLOOD LOSS:  25 mL.      COMPLICATIONS:  None apparent.      ANESTHESIA:   1. General anesthesia.   2. Local anesthetic in a field block around all port sites.   3. On-Q pain pump using 0.25% bupivacaine to block the lower halves of       the right and left lower quadrants.      INDICATIONS:  Luis Deleon is a pleasant 58-year gentleman who has had   three prior abdominal surgeries including urgent surgery for   ileocecectomy for Crohn surgery for radical prostatectomy and he had to   have a hernia repair done with Gore-Tex mesh.  He has always had a   moderate amount of bulging around his  abdomen.  He had a workup for   abdominal pain which was negative was sent to [place] for surgical   evaluation.  He was initially seen by my partner Dr. Ronnald Collum who   based on concerns of his prior intra-abdominal surgeries wished a second   opinion with myself.  CT scan showed very thin wall near the   periumbilical region concerning for ventral hernia.  He did not improve   on conservative measures of nonsteroidals, ice pack and decreased   activity.  Based on his concerns, options were discussed and   recommendations made for diagnostic laparoscopy with  lysis of adhesions   and possible ventral hernia repair.  Risks, benefits and alternatives   were discussed in detail.  Questions were answered.  He agreed to   proceed.      On the day of surgery, he noted his pain had decreased.  We had offered   to cancel the case, but he felt strongly that he was worried that it   would come back since this has happened before and he wished to go ahead   and proceed with surgery and I agreed.      OPERATIVE FINDINGS:  He had dense adhesions of small bowel, especially   as well as omentum to his Gore-Tex mesh, especially around the rims of   the mesh but also within the center of it as well.  He had numerous   interloop small bowel adhesions as well going down to his pelvis.  He   had some dense adhesions of his omentum to his small bowel as well.  The   rest of his abdomen was unremarkable.  He did have any obvious ventral   hernia recurrence and diastases of the lower part of his abdomen   infraumbilically involving a fair amount of his anterior abdominal wall.      DESCRIPTION OF PROCEDURE:  Informed consent was confirmed.  The patient   underwent general anesthesia without any difficulty.  He had a Foley   catheter sterilely placed.  He had 2 g IV cefazolin given just prior to   surgery.  He had sequential compression devices or SCDs active during   entire case.  His abdomen was clipped,  prepped and draped in sterile   fashion.      A 5 mm laparoscopic portal was placed in the left upper quadrant of the   patient using optical entry technique with the patient in steep reverse   Trendelenburg position.  A capnoperitoneum to 15 mmHg provided good   intra-abdominal insufflation.  Camera inspection revealed no obvious   intra-abdominal injury.  Under direct visualization a 12 mm port was   placed in left flank.  A final port was placed slightly inferior to   this.  Later in the case a 5 mm port was placed in the right upper   quadrant.      Diagnostic laparoscopy revealed dense adhesions of numerous loops of   small bowel, some colon and especially omentum to the anterior abdominal   wall.  These were carefully freed off using cold scissors primarily but   occasionally some harmonic scalpel on the dense omental adhesions once   we were sure that small bowel was freed off.  This took some time and I   think freed everything off the anterior abdominal wall.  I then started   to run the small bowel from the ileocolonic anastomosis to the ligament   of Treitz.  Doing that there were numerous interloop adhesions that were   rather dense as well as adhesions down, especially in the right lower   quadrant from his prior ileocecectomy and down to his pelvis.  These   were carefully freed off using cold scissors.  Ultimately everything was   completely untwisted and the mesentery was allowed to lay better.  His   greater omentum was freed off small bowel as well.      I ran the small bowel again and there was no enterotomies or significant   serosal tears or any evidence of injury.  Diagnostic  laparoscopy   revealed the anterior abdominal wall defects especially   infraumbilically.  I chose to err on the side of using a larger piece of   mesh since he had failed with a rather moderate size Gore-Tex mesh.   Alternating #1 Ethibond and Novofil stitches were placed in the rough    side of the mesh x 12.  The mesh was rolled rough side inwards, placed   through the 12 port site defect, unrolled and attached to the anterior   abdominal wall using a laparoscopic suture passer.  The mesh laid well   and had good circumferential coverage.  There was minimal tension.  The   ties were tied down loosely with functional air-knots to avoid any   trapped or painful nerves.  Inspection again and a tacker was used to   help tack the edges of the mesh together as well as the middle part of   the mesh as well.  This provided good coverage.      Reinspection was made again and there is no evidence of significant   bleeding or leak of small bowel or enterotomy or other injury.  The   greater omentum was allowed to fall down and reach down in the pelvis   well.  The 12 mm fascial defect was closed using #1 Novofil using   laparoscopic suture passer to good results.  Capnoperitoneum was   completely evacuated.  Ports were removed.  Note that On-Q pain pump   sheath had been passed from the right flank down towards the right   suprapubic region as well as the same on the left side in a mirror-image   fashion.  This was done during diagnostic laparoscopy to make sure the   sheath was going into the preperitoneal plane.  After capnoperitoneum   was evacuated and ports had been removed, skin defects were closed using   4-0 Monocryl stitch on the port sites and Steri-Strips in the small stab   incisions for the fascial stitches and Steri-Strips used to help the On-   Q, brown catheters were passed in the sheaths and the sheaths were   peeled away.  Left side had about 40 cm On-Q and the right had about 20.   The catheter was secured to the skin using Steri-Strips and sterile   dressings and On-Q was started.      I am about to explain the operative findings to the patient's daughter.   I suspect he will need to stay at least overnight if not longer.               Adin Hector, MD     Electronically Signed            SCG/MEDQ  D:  02/11/2008  T:  02/11/2008  Job:  [id number]      cc:   Chipper Herb, M.D.   Fax: [phone]      Lowella Bandy. Olevia Perches, [place]   [place] [address]

## 2010-11-20 ENCOUNTER — Encounter: Payer: Self-pay | Admitting: Internal Medicine

## 2010-11-29 ENCOUNTER — Other Ambulatory Visit: Payer: Self-pay | Admitting: Family Medicine

## 2010-11-29 DIAGNOSIS — M542 Cervicalgia: Secondary | ICD-10-CM

## 2010-12-05 ENCOUNTER — Ambulatory Visit
Admission: RE | Admit: 2010-12-05 | Discharge: 2010-12-05 | Disposition: A | Discharge status: Home / Self Care | Payer: Medicare Other | Source: Ambulatory Visit | Attending: Family Medicine | Admitting: Family Medicine

## 2010-12-05 DIAGNOSIS — M542 Cervicalgia: Secondary | ICD-10-CM

## 2011-01-22 ENCOUNTER — Encounter: Payer: Self-pay | Admitting: Family Medicine

## 2011-01-25 ENCOUNTER — Other Ambulatory Visit: Payer: Self-pay | Admitting: Internal Medicine

## 2011-01-26 ENCOUNTER — Other Ambulatory Visit: Payer: Self-pay | Admitting: Internal Medicine

## 2011-01-26 MED ORDER — MERCAPTOPURINE 50 MG PO TABS: ORAL_TABLET | ORAL | Status: DC

## 2011-01-26 NOTE — Telephone Encounter (Signed)
rx sent to patient's pharmacy.

## 2011-02-23 ENCOUNTER — Telehealth: Payer: Self-pay | Admitting: Internal Medicine

## 2011-02-23 NOTE — Telephone Encounter (Signed)
Spoke with patient's wife. She states the patient was in pain all last night and did not sleep. He is vomiting this AM. States patient and her daughter left going to the [facility] ED about 20 minutes ago. Nicoletta Ba, PA made aware of patient and pending arrival at ED.

## 2011-02-27 NOTE — Op Note (Signed)
NAMEALMANDO, Luis Deleon NO.:  [id number]   MEDICAL RECORD NO.:  [id number]          PATIENT TYPE:  AMB   LOCATION:  DAY                          FACILITY:  [facility]   PHYSICIAN:  Adin Hector, MD     DATE OF BIRTH:  [birth year]   DATE OF PROCEDURE:  02/11/2008  DATE OF DISCHARGE:                               OPERATIVE REPORT   PRIMARY CARE PHYSICIAN:  Dr. Dennard Schaumann of  [facility]  and also followed by Chipper Herb, M.D. in [place], [place]  and Dr. [name] in [place], [place] with gastroenterology.   SURGEON:  Adin Hector, MD   ASSISTANT:  None.   PREOPERATIVE DIAGNOSES:  1. Abdominal pain.  2. Prior ventral hernia repair, questions of hernia recurrence.   POSTOPERATIVE DIAGNOSES:  1. Numerous interloop and anterior abdominal wall small bowel and      omental adhesions.  2. Recurrent ventral hernia.   PROCEDURE PERFORMED:  1. Laparoscopic lysis of adhesions x2-1/2 hours, which was two thirds      of the case.  2. Laparoscopic ventral hernia repair with 20 x 30 cm dual sided mesh      (Proceed equals ultra light weight polypropylene/cellulose      nonadherent barrier dual sided mesh).   SPECIMEN:  None.   DRAINS:  None.   ESTIMATED BLOOD LOSS:  25 mL.   COMPLICATIONS:  None apparent.   ANESTHESIA:  1. General anesthesia.  2. Local anesthetic in a field block around all port sites.  3. On-Q pain pump using 0.25% bupivacaine to block the lower halves of      the right and left lower quadrants.   INDICATIONS:  Mr. Baby is a pleasant 36-year gentleman who has had  three prior abdominal surgeries including urgent surgery for  ileocecectomy for Crohn surgery for radical prostatectomy and he had to  have a hernia repair done with Gore-Tex mesh.  He has always had a  moderate amount of bulging around his abdomen.  He had a workup for  abdominal pain which was negative was sent to [place] for surgical  evaluation.   He was initially seen by my partner Dr. Ronnald Collum who  based on concerns of his prior intra-abdominal surgeries wished a second  opinion with myself.  CT scan showed very thin wall near the  periumbilical region concerning for ventral hernia.  He did not improve  on conservative measures of nonsteroidals, ice pack and decreased  activity.  Based on his concerns, options were discussed and  recommendations made for diagnostic laparoscopy with lysis of adhesions  and possible ventral hernia repair.  Risks, benefits and alternatives  were discussed in detail.  Questions were answered.  He agreed to  proceed.   On the day of surgery, he noted his pain had decreased.  We had offered  to cancel the case, but he felt strongly that he was worried that it  would come back since this has happened before and he wished to go ahead  and proceed with surgery and I agreed.  OPERATIVE FINDINGS:  He had dense adhesions of small bowel, especially  as well as omentum to his Gore-Tex mesh, especially around the rims of  the mesh but also within the center of it as well.  He had numerous  interloop small bowel adhesions as well going down to his pelvis.  He  had some dense adhesions of his omentum to his small bowel as well.  The  rest of his abdomen was unremarkable.  He did have any obvious ventral  hernia recurrence and diastases of the lower part of his abdomen  infraumbilically involving a fair amount of his anterior abdominal wall.   DESCRIPTION OF PROCEDURE:  Informed consent was confirmed.  The patient  underwent general anesthesia without any difficulty.  He had a Foley  catheter sterilely placed.  He had 2 g IV cefazolin given just prior to  surgery.  He had sequential compression devices or SCDs active during  entire case.  His abdomen was clipped, prepped and draped in sterile  fashion.   A 5 mm laparoscopic portal was placed in the left upper quadrant of the  patient using optical entry  technique with the patient in steep reverse  Trendelenburg position.  A capnoperitoneum to 15 mmHg provided good  intra-abdominal insufflation.  Camera inspection revealed no obvious  intra-abdominal injury.  Under direct visualization a 12 mm port was  placed in left flank.  A final port was placed slightly inferior to  this.  Later in the case a 5 mm port was placed in the right upper  quadrant.   Diagnostic laparoscopy revealed dense adhesions of numerous loops of  small bowel, some colon and especially omentum to the anterior abdominal  wall.  These were carefully freed off using cold scissors primarily but  occasionally some harmonic scalpel on the dense omental adhesions once  we were sure that small bowel was freed off.  This took some time and I  think freed everything off the anterior abdominal wall.  I then started  to run the small bowel from the ileocolonic anastomosis to the ligament  of Treitz.  Doing that there were numerous interloop adhesions that were  rather dense as well as adhesions down, especially in the right lower  quadrant from his prior ileocecectomy and down to his pelvis.  These  were carefully freed off using cold scissors.  Ultimately everything was  completely untwisted and the mesentery was allowed to lay better.  His  greater omentum was freed off small bowel as well.   I ran the small bowel again and there was no enterotomies or significant  serosal tears or any evidence of injury.  Diagnostic laparoscopy  revealed the anterior abdominal wall defects especially  infraumbilically.  I chose to err on the side of using a larger piece of  mesh since he had failed with a rather moderate size Gore-Tex mesh.  Alternating #1 Ethibond and Novofil stitches were placed in the rough  side of the mesh x 12.  The mesh was rolled rough side inwards, placed  through the 12 port site defect, unrolled and attached to the anterior  abdominal wall using a laparoscopic  suture passer.  The mesh laid well  and had good circumferential coverage.  There was minimal tension.  The  ties were tied down loosely with functional air-knots to avoid any  trapped or painful nerves.  Inspection again and a tacker was used to  help tack the edges of the mesh together as  well as the middle part of  the mesh as well.  This provided good coverage.   Reinspection was made again and there is no evidence of significant  bleeding or leak of small bowel or enterotomy or other injury.  The  greater omentum was allowed to fall down and reach down in the pelvis  well.  The 12 mm fascial defect was closed using #1 Novofil using  laparoscopic suture passer to good results.  Capnoperitoneum was  completely evacuated.  Ports were removed.  Note that On-Q pain pump  sheath had been passed from the right flank down towards the right  suprapubic region as well as the same on the left side in a mirror-image  fashion.  This was done during diagnostic laparoscopy to make sure the  sheath was going into the preperitoneal plane.  After capnoperitoneum  was evacuated and ports had been removed, skin defects were closed using  4-0 Monocryl stitch on the port sites and Steri-Strips in the small stab  incisions for the fascial stitches and Steri-Strips used to help the On-  Q, brown catheters were passed in the sheaths and the sheaths were  peeled away.  Left side had about 40 cm On-Q and the right had about 20.  The catheter was secured to the skin using Steri-Strips and sterile  dressings and On-Q was started.   I am about to explain the operative findings to the patient's daughter.  I suspect he will need to stay at least overnight if not longer.      Adin Hector, MD  Electronically Signed     SCG/MEDQ  D:  02/11/2008  T:  02/11/2008  Job:  [id number]   cc:   Chipper Herb, M.D.  Fax: [phone]   Lowella Bandy. Olevia Perches, [place]  [place] [address]

## 2011-02-27 NOTE — Assessment & Plan Note (Signed)
[place]                         GASTROENTEROLOGY OFFICE NOTE   Luis Deleon, Luis Deleon                   MRN:          [id number]  DATE:08/26/2007                            DOB:          11/09/1940    Luis Deleon is a very nice 70 year old gentleman with Crohn's disease of  the terminal ileum since 1984.  Status post terminal ileal resection in  September 2003 after acute small bowel obstruction.  Last colonoscopy  was done in February 2004.  Next colonoscopy February 2009.  I have not  seen him for a year.  He has done quite well.  Crohn's disease has been  inactive.  He denies diarrhea, except for maybe once every 2 weeks.  He  is on regular diet.  His weight has increased.  He denies abdominal pain  or rectal bleeding.   MEDICATIONS:  1. Benicar hydrochlorothiazide 40/12.5 one p.o. daily.  2. Vytorin 10/40 one p.o. daily.  3. 6-Mercaptopurine 50 mg p.o. daily.  4. Aspirin 81 mg p.o. daily.  5. Niaspan.  6. Lovaza.  7. Folnate Plus.  8. Allopurinol 300 mg p.o. daily.   PHYSICAL EXAM:  Blood pressure 100/60, pulse 76, weight 222 pounds.  He was alert and oriented, no distress.  LUNGS:  Clear to auscultation.  COR:  Normal S1, normal S2.  ABDOMEN:  Soft and nontender with normoactive bowel sounds.  Well-healed  surgical scar.  No tenderness in right lower quadrant.  RECTAL:  Normal rectal tone.  Stool was Hemoccult negative.  The patient has a history of recent onset of gout.   His lab data recently showed hemoglobin 13.2, hematocrit 37.8, with MCV  of 109, platelet count 203,000, white cell count 5,000.  His creatine  was elevated at 1.4.  B12 level was normal at 704.   IMPRESSION:  66. A 70 year old gentleman with stable Crohn's disease status post      terminal ileal resection doing well.  2. New onset gout.  The patient unable to tolerate colchicine because      of severe diarrhea.   PLAN:  1. Substitute indomethacin 25 mg p.o.  t.i.d. p.r.n. acute onset of      gout.  2. Continue allopurinol 300 mg a day.  3. Due for colonoscopy in February 2009.  4. Decrease 6-Mercaptopurine to 25 mg daily.  5. Another alternative to indomethacin would be low-dose prednisone.     Luis Deleon. Luis Perches, MD  Electronically Signed    DMB/MedQ  DD: 08/26/2007  DT: 08/26/2007  Job #: [id number]   cc:   Luis Deleon, M.D.

## 2011-03-02 ENCOUNTER — Other Ambulatory Visit (INDEPENDENT_AMBULATORY_CARE_PROVIDER_SITE_OTHER): Payer: Medicare Other

## 2011-03-02 ENCOUNTER — Encounter: Payer: Self-pay | Admitting: Internal Medicine

## 2011-03-02 ENCOUNTER — Ambulatory Visit (INDEPENDENT_AMBULATORY_CARE_PROVIDER_SITE_OTHER): Payer: Medicare Other | Admitting: Internal Medicine

## 2011-03-02 VITALS — BP 144/82 | HR 68 | Ht 69.0 in | Wt 232.0 lb

## 2011-03-02 DIAGNOSIS — K5080 Crohn's disease of both small and large intestine without complications: Secondary | ICD-10-CM

## 2011-03-02 DIAGNOSIS — K5090 Crohn's disease, unspecified, without complications: Secondary | ICD-10-CM

## 2011-03-02 DIAGNOSIS — K56609 Unspecified intestinal obstruction, unspecified as to partial versus complete obstruction: Secondary | ICD-10-CM

## 2011-03-02 LAB — COMPREHENSIVE METABOLIC PANEL
BUN: 15 mg/dL (ref 6–23)
CO2: 30 mEq/L (ref 19–32)
GFR: 50.71 mL/min — ABNORMAL LOW (ref >60.00)
Glucose, Bld: 81 mg/dL (ref 70–99)
Sodium: 140 mEq/L (ref 135–145)
Total Bilirubin: 1.7 mg/dL — ABNORMAL HIGH (ref 0.3–1.2)
Total Protein: 7.3 g/dL (ref 6.0–8.3)

## 2011-03-02 LAB — CBC WITH DIFFERENTIAL/PLATELET
Basophils Relative: 0.2 % (ref 0.0–3.0)
Eosinophils Relative: 1.5 % (ref 0.0–5.0)
HCT: 53 % — ABNORMAL HIGH (ref 39.0–52.0)
Lymphs Abs: 1.5 10*3/uL (ref 0.7–4.0)
Monocytes Relative: 11.5 % (ref 3.0–12.0)
Neutrophils Relative %: 67.9 % (ref 43.0–77.0)
Platelets: 188 10*3/uL (ref 150.0–400.0)
RBC: 5.55 Mil/uL (ref 4.22–5.81)
WBC: 7.8 10*3/uL (ref 4.5–10.5)

## 2011-03-02 MED ORDER — OMEPRAZOLE 20 MG PO CPDR: DELAYED_RELEASE_CAPSULE | ORAL | Status: DC

## 2011-03-02 MED ORDER — MERCAPTOPURINE 50 MG PO TABS: ORAL_TABLET | ORAL | Status: DC

## 2011-03-02 MED ORDER — CIPROFLOXACIN HCL 500 MG PO TABS: ORAL_TABLET | ORAL | Status: DC

## 2011-03-02 NOTE — Discharge Summary (Signed)
Luis Deleon, Luis Deleon                ACCOUNT NO.:  [id number]   MEDICAL RECORD NO.:  [id number]          PATIENT TYPE:  INP   LOCATION:  3295                         FACILITY:  [facility]   PHYSICIAN:  Marshall Cork. Jeffie Pollock, M.D.    DATE OF BIRTH:  1941-07-28   DATE OF ADMISSION:  11/16/2004  DATE OF DISCHARGE:  11/19/2004                                 DISCHARGE SUMMARY   ADMITTING DIAGNOSES:  Prostate cancer.   DISCHARGE DIAGNOSES:  Prostate cancer.   PROCEDURES PERFORMED:  Bilateral nerve sparing radical retropubic  prostatectomy with bilateral pelvic lymph node dissection (November 16, 2004).   BRIEF HISTORY OF PRESENT ILLNESS:  Luis Deleon is a pleasant 70 year old male  who was initially evaluated for an elevated PSA.  A subsequent transrectal  ultrasound guided prostate biopsy showed presence of prostate cancer.  Several options for therapy were discussed with the patient including  watchful waiting, cryo surgery, chemotherapy, hormonal ablation, external  beam therapy, and brachytherapy.  The patient wishes to proceed with  surgical extirpation of his prostate.  He is admitted at this time to  undergo the aforementioned procedure.   HOSPITAL COURSE:  Following admission the patient was taken to the operating  room where he underwent a bilateral nerve sparing radical retropubic  prostatectomy with bilateral pelvic lymph node dissection.  The patient  tolerated the procedure well and there were no complications.  For a full  summary of the surgical procedure please see the dictated operative note  dated November 16, 2004.  Postoperatively the patient's hemoglobin was 13.5.  He had some tachycardia in the postanesthesia care unit which was documented  as sinus tachycardia.  Patient was transferred to the floor on telemetry  bed.  On postoperative day #1 the patient's creatinine was stable at 1.1 and  his hemoglobin was again 13.4.  He was hemodynamically stable and his  tachycardia had  improved somewhat with his heart rate in the low 100s.  The  patient was given Lasix to help him diurese.  On postoperative day #1 he was  tolerating a clear liquid diet.  Patient was ambulating well.  By  postoperative day #2 his JP drainage output was minimal at 20 mL.  His JP  drain was discontinued on postoperative day #3 and he was tolerating a p.o.  diet.  He had had a bowel movement.  The remainder of the hospital course  was uneventful.   DISCHARGE MEDICATIONS:  1.  Vicodin.  2.  Cipro.  3.  Colace.   DIET AND ACTIVITY:  The patient is discharged to home on a regular diet.  Activity level is restricted to include no bending, stooping, lifting,  moving heavy objects or doing strenuous activity.   CONDITION ON DISCHARGE/FOLLOW-UP:  On the day of discharge patient is able  to tolerate a p.o. diet, ambulate without assistance.  Has good urine output  through his Foley catheter.  He will follow up with Dr. Jeffie Pollock in one week.   He has been asked to call Dr. Ralene Muskrat office or the urology physician on-  call with any  further questions or concerns.  This includes a temperature  greater than 101.5 as well as any erythema, drainage, or opening of the  patient's surgical wounds.      EG/MEDQ  D:  11/30/2004  T:  11/30/2004  Job:  [id number]

## 2011-03-02 NOTE — Op Note (Signed)
Luis Deleon, Luis Deleon                ACCOUNT NO.:  [id number]   MEDICAL RECORD NO.:  [id number]          PATIENT TYPE:  INP   LOCATION:  3762                         FACILITY:  [facility]   PHYSICIAN:  Marshall Cork. Jeffie Pollock, M.D.    DATE OF BIRTH:  1941/07/28   DATE OF PROCEDURE:  11/16/2004  DATE OF DISCHARGE:                                 OPERATIVE REPORT   PREOPERATIVE DIAGNOSIS:  Adenocarcinoma of the prostate.   POSTOPERATIVE DIAGNOSIS:  Adenocarcinoma of the prostate.   OPERATION/PROCEDURE:  Radical retropubic prostatectomy with bilateral pelvic  lymph node dissection.   SURGEON:  Marshall Cork. Jeffie Pollock, M.D.   RESIDENT SURGEON:  Lynford Citizen, M.D.   ANESTHESIA:  General endotracheal anesthesia.   ESTIMATED BLOOD LOSS:  300 mL.   DRAINS:  22-French Foley catheter to straight drain and a 10-French Blake  drain to bulb suction.   COMPLICATIONS:  None.   SPECIMENS:  1.  Prostate.  2.  Bilateral pelvic lymph node packets.   INDICATIONS FOR PROCEDURE:  Mr. Grave is a 70 year old white male initially  evaluated for a prosthetic nodule on physical examination.  The patient had  a PSA value of 1.4 which has been stable over the last few years.  Given his  prostatic nodule, he underwent a transrectal ultrasound-guided prostate  biopsy which showed a focus of Gleason's 6 (3 + 3 adenocarcinoma of the  prostate in less than 5% of the left prostate needle biopsies).  The patient  has no lower urinary tract symptoms.  Given the patient's prostate cancer,  several treatment options have been discussed including watchful waiting,  surgical extirpation, radiation therapy including external beam and seed  implantation and cryo ablation.  The patient has elected to proceed with  surgical therapy.  All risks, benefits and alternatives of the procedure  have been described in detail and the patient is willing to proceed.   DESCRIPTION OF PROCEDURE:  Following identification by arm bracelet, the  patient  was brought to the operating room and placed in the supine position.  Here a small bump was placed beneath the patient's lumbar region.  He then  underwent successful induction of general endotracheal anesthesia and  received preoperative IV antibiotics.  His lower abdomen was then shaved.  He was prepped from above his umbilicus to his genitalia with Betadine and  draped in the usual sterile fashion.  A 22-French Foley catheter was then  inserted into the bladder and the bladder was drained.   A lower midline incision was then made approximately 8 cm in length from the  pubis to midway below the umbilicus.  Bovie electrocautery was used to carry  the incision through the subcutaneous tissues.  The anterior rectus fascia  was encountered and incised in its midline.  The rectus muscle was then  identified and parted in the midline.  The transversalis fascia was then  opened and blunt dissection was used to expose both the right and the left  pelvic fossa.  Of note, there were rather large circumflex iliac veins  noted.  We then exposed the right iliac  fossa using malleable retractors  with the Buchwalter self-retaining unit.  We then initiated our dissection  of the right-sided lymph node packet over the iliac vein.  The node packet  was rather small and there was no evidence of gross nodal disease.  The  limits of the node dissection included the external iliac vein, the  obturator nerve, the circumflex iliac vein, and the bifurcation of the iliac  artery.  Large Hem-o-Lok clips were used to control vascular and lymphatic  channels.  The obturator nerve was in plain sight and protected throughout  the dissection.  Again, there was no gross evidence of nodal disease. We  then turned our attention to the left-sided lymph node dissection which was  performed in an identical fashion, again using large clips for control of  lymphatic and vascular channels.   We then began our dissection of the  prostate.  The retractors were  readjusted and the endopelvic fascia was identified and punctured on both  the right and the left.  A right angle was used to elevate the endopelvic  fascia towards the bladder neck, further releasing the neurovascular bundles  posteriorly.  In addition, a tonsil was used to closely adhere to the  lateral aspects of the prostate and develop a plain beneath the prostate,  thereby further allowing the neurovascular bundles to fall away from our  surgical specimen.  The fat overlying the dorsal venous complex was then  gently teased away to narrow the area.  The puboprostatic ligaments were  then gently taken down at their extreme lateral attachments to the pubis.  Those in the midline were left intact.  The Hohenfellner clamp was then  passed beneath the dorsal venous complex.  A #1 Vicryl tie was passed and  tied around the dorsal venous complex.  The needle was left on the suture  which was then placed in the periosteum just above the dorsal venous complex  and tied using this to tent the complex anteriorly.  An Allis clamp was then  used to grasp the edges of the endopelvic fascia and approximate them over  the prostate near the bladder neck.  A 2-0 Vicryl suture on a UR5 needle was  then used in a figure-of-eight fashion to control any backbleeding vessels.  The Hohenfellner clamp was then again placed below the dorsal venous  complex.  The Bovie was then used to divide the dorsal venous complex.  However, during this part of the division, the Foley catheter was visible  anteriorly.  We reset the Hohenfellner clamp below the dorsal venous complex  and continued its division.  This showed that the urethra near the apex of  the prostate was in fact open showing the Foley catheter within.  It  appeared the suture used to control the dorsal venous complex had a portion of the urethra.  This suture was used to provide traction as the Foley was  then removed from  the urethra after the catheter was lubricated and brought  into the wound.  Catheter was then used to provide cephalad traction on the  prostate during the proceeding dissection.  With the urethra nicely visible  at this point, we placed our 10 and 2 o'clock anastomotic sutures in the  urethral stump from outside to inside using the [place] device in the  urethra.  These were set aside for use later.  The remainder of the urethra  was then cut using Metzenbaum scissors.  The rectourethralis attachments  were then  taken down bluntly.  Blunt dissection as well as careful  Metzenbaum scissors dissection to find the proper plane were then used to  dissect the prostate from the rectum bluntly.  This allowed excellent  exposure of the lateral pedicles.  The lateral pedicles were then taken down  successively using right angle dissection and large right angle Hem-o-Lok  clips.  Once the prostate had been sufficiently elevated, the anterior leaf  of Denonvilliers fascia was incised overlying the seminal vesicles.  This  allowed delivery of the ampulla of the vas which were dissected out using a  right angle clamp and then ligated using large Hem-o-Lok clips.  The seminal  vesicles were dissected out in a similar fashion and ligated using large  clips.   We then turned our attention anteriorly where the bladder neck was grasped  between two Allis clamps.  Using Bovie electrocautery the bladder neck was  defined at its entry to the prostate.  A tonsil clamp was used to further  dissect out the bladder neck fibers.  The urethra was then divided at its  entry into the bladder and the Foley catheter balloon was visualized.  The  catheter balloon was deflated and the Foley catheter brought from the  bladder and used to provide traction on the prostate.  Both ureteral  orifices were then easily identified, well away from the bladder neck, and  seemed to be effluxing blue urine from an early  administration of indigo  carmine.  The posterior bladder neck was then divided along with any  remaining posterior prostatic attachments.  We then everted the bladder neck  mucosa using several interrupted 4-0 chromic sutures.  The bladder neck was  then reapproximated in a tennis racquet fashion using a running 2-0 chromic  suture.  The residual bladder neck was large enough to admit the tip of the  fifth finger of the surgeon.  We then copiously irrigated the wound.  Any  further active bleeding was controlled using the Bovie and small Hem-o-Lok  clips.  At this point the prostatic specimen had been passed from the  operative field and sent for further pathologic analysis.  The [place]  device was then again passed into the urethra.  The 12, 7, and 5 o'clock  anastomotic sutures were then placed in the urethral stump from outside to  in.  The sutures were then placed in a successive fashion at their corresponding areas on the bladder neck in an inside to out fashion.  A new  22-French Foley catheter was then placed through the urethra and guided into  the bladder.  The balloon was inflated with 30 mL of sterile fluid.  We  again copiously irrigated the wound.  There was no evidence of further  active bleeding.  The bladder neck was then securely positioned against the  urethral stump, removing all slack from the anastomotic sutures.  The  sutures were then tied and trimmed.  The bladder was irrigated.  The urine  was found to be pink and the anastomosis watertight.  We placed #10 flat,  fluted Blake drain through a separate stab wound lateral to the lower left  aspect of the abdominal incision.  This was positioned over the anastomotic  area as well as the right and left pelvic fossa.  We then closed the fascia  with a running #1 PDS.  The subcutaneous tissues were irrigated with sterile  saline and the skin was closed using surgical clips.  Foley catheter was  irrigated once again and  no bleeding was noted.  The catheter was placed to  straight drainage and the Jackson-Pratt drain to bulb suction.  All sponge,  needle and instrument counts were correct x2.  The patient tolerated the  procedure well and there were no complications.   Please note that Dr. Jeffie Pollock was present and participated in the entire  procedure as he was the responsible surgeon.   DISPOSITION:  After awaking from general anesthesia, the patient was  transferred from the post anesthesia care unit in stable condition.  From  here he would be transferred to the floor for further postoperative  management.      EG/MEDQ  D:  11/18/2004  T:  11/19/2004  Job:  [id number]

## 2011-03-02 NOTE — Op Note (Signed)
Luis Deleon, DEGANTE NO.:  [id number]   MEDICAL RECORD NO.:  [id number]                   PATIENT TYPE:  INP   LOCATION:  0263                                 FACILITY:  [facility]   PHYSICIAN:  Douglas A. Ninfa Linden, M.D.           DATE OF BIRTH:  April 10, 1941   DATE OF PROCEDURE:  05/22/2002  DATE OF DISCHARGE:                                 OPERATIVE REPORT   PREOPERATIVE DIAGNOSES:  Small bowel obstruction, Crohn's disease.   POSTOPERATIVE DIAGNOSES:  Small bowel obstruction, Crohn's disease.   PROCEDURES:  1. Exploratory laparotomy.  2. Small bowel resection.   SURGEON:  Douglas A. Ninfa Linden, M.D.   ASSISTANT:  Merri Ray. Grandville Silos, MD   ANESTHESIA:  General endotracheal.   ESTIMATED BLOOD LOSS:  Minimal.   INDICATIONS:  The patient is a 70 year old gentleman with known Crohn's  disease and history of bowel obstruction in the past, who present with  another SBO.  This has failed conservative management, and therefore the  decision has been made to proceed to the operating room for exploration.   FINDINGS:  The patient was found to have about one and a half feet of distal  ileum which had multiple areas of stricturing and creeping fat causing the  obstruction.  The rest of the abdominal cavity appeared free of disease.   PROCEDURE IN DETAIL:  The patient was brought to the operating room and  identified correctly.  He was placed supine on the operating table, and  general anesthesia was induced.  His abdomen was then prepped and draped in  the usual sterile fashion.  Using a 10 blade, a small midline incision then  created.  Incision was carried down through the fascia with the  electrocautery.  The peritoneum was then opened the entire length of the  incision.  Upon entering the abdomen the patient was found to have multiple  dilated loops of small bowel as well as free intra-abdominal fluid.  The  small bowel was eviscerated, and the area of  disease was found in the distal  ileum.  The patient was found to have creeping fat with areas of stricturing  in about the last foot and one half of distal ileum to the ileocecal valve.  No other intra-abdominal pathology was identified.  The entire small bowel  was run from the ligament of Treitz to the terminal ileum and again, no  further evidence of Crohn's disease was identified.  At this point, decision  was made to proceed with a small bowel resection.  The cecum and right colon  was mobilized along the white line of Toldt.  An area of ileum just proximal  to the area of obstruction was then identified and transected with GIA 75  stapler.  An area of the cecum was then identified and transected with the  GIA 75 stapler as well.  The mesentery was then taken down with Claiborne Billings  clamps  and 2-0 silk ties.  The entire specimen of small bowel, cecum, and appendix  were then entirely removed and sent to pathology for identification.  Again,  all bleeding in the mesentery was easily controlled.  Next, an enterotomy  was created, and a large amount of fluid was sucked out of the proximal  small intestines.  Next, a side-to-side ileocolonic anastomosis was created  by first creating a colotomy and then approximating the two pieces of bowel  together with the GIA 75 stapler, creating the anastomosis.  The staple line  was examined and found to be free of any bleeding.  A TA 60 was then used to  close the remaining open end of bowel.  A large anastomosis appeared to be  created.  The mesenteric defect was then closed with interrupted 3-0 silks  sutures.  The abdomen was then copiously irrigated with normal saline.  Again, hemostasis appeared to be achieved.  The midline fascia was then  closed with running #1 PDS suture.  Skin was then irrigated and closed with  skin staples.  The patient tolerated the procedure well.  All sponge,  needle, and instrument counts were correct at the end of the  procedure.  The  patient was then extubated in the operating room and taken in stable  condition to the recovery room.                                               Douglas A. Ninfa Linden, M.D.    DAB/MEDQ  D:  05/22/2002  T:  05/25/2002  Job:  [id number]

## 2011-03-02 NOTE — H&P (Signed)
NAMEJASMOND, Luis Deleon                ACCOUNT NO.:  [id number]   MEDICAL RECORD NO.:  [id number]          PATIENT TYPE:  INP   LOCATION:  4917                         [facility]ACILITY:  [facility]   PHYSICIAN:  Marshall Cork. Jeffie Pollock, M.D.    DATE OF BIRTH:  10-11-1941   DATE OF ADMISSION:  11/16/2004  DATE OF DISCHARGE:                                HISTORY & PHYSICAL   CHIEF COMPLAINT:  Prostate cancer.   HISTORY OF PRESENT ILLNESS:  This is a pleasant, 70 year old male initially  seen in evaluation for a prostate nodule.  The patient's serum PSA was 1.4  and has been stable over the last few years.  The patient denies any  irritative or obstructive voiding symptoms.  There is no family history of  prostate cancer and the patient has never had genitourinary surgery.   PAST MEDICAL HISTORY:  Significant for Crohn's disease for which he has  occasional bloody stools and diarrhea.  He denies any fevers or chills,  nausea or vomiting or night sweats.  1.  Hypertension.  2.  Crohn's disease.  3.  Nephrolithiasis (status post lithotripsy).  4.  Hypercholesterolemia.   PAST SURGICAL HISTORY:  1.  Exploratory laparotomy for small bowel obstruction (August of 2003).  2.  Inguinal hernia repair (July of 2004).   REVIEW OF SYSTEMS:  As per the history of present illness.  All other  systems were reviewed and negative.   FAMILY HISTORY:  The patient denies any family history of genitourinary  malignancy.  Family history is pertinent for heart disease and hypertension.   SOCIAL HISTORY:  The patient was a 30 pack-year smoker.  He quit in 1993.  He denies any use of alcohol.  He works in a [employer].   MEDICATIONS:  1.  Aspirin 81 mg q day.  2.  Mercaptopurine 50 mg q day.  3.  FA-Cyancoba-Pyridoxine.  4.  Vytorin 10/40 p.o. q day.  5.  Niaspan 1000 mg q day.  6.  Benicar 40/12.5 mg p.o. q day.   ALLERGIES:  No known drug allergies.   PHYSICAL EXAMINATION:  VITAL SIGNS:   Temperature 98.6, heart rate 83, blood pressure 134/80.  GENERAL:  A well-developed, well-nourished, in no apparent distress.  HEENT:  Normocephalic, atraumatic.  Pupils equal, round, react to light and  accommodation.  Extraocular movements are intact.  NECK:  Supple.  No lymphadenopathy.  There is no JVD.  There are no carotid  bruits.  CARDIOVASCULAR:  Regular rate and rhythm.  No murmurs, gallops or rubs.  PULMONARY:  Clear to auscultation bilaterally.  ABDOMEN:  Soft, non-tender, nondistended, positive bowel sounds.  There is  no hepatosplenomegaly.  BACK:  No CVA tenderness bilaterally.  GENITOURINARY:  Normal-appearing uncircumcised male phallus.  The testicles  are bilaterally descended without masses or nodules.  The epididymis are  unremarkable.  DIGITAL RECTAL EXAM:  1-2+ prostate with a small nodule in the right mid  portion of the gland.  The seminal vesicles are not palpable.  Sphincter  tone is normal.  EXTREMITIES:  No cyanosis, clubbing  or edema.  NEUROLOGIC:  Cranial nerves II-XII are intact.  Sensation is grossly  nonfocal.  Motor is 5/5 throughout.  SKIN:  Warm and dry.   IMPRESSION:  This is a pleasant 70 year old male with a prostatic nodule.   PROSTATE BIOPSY RESULTS:  Prostate biopsy performed in December of 2005  shows 5% of the left biopsy is consistent with a Gleason 6 (3 + 3)  adenocarcinoma.  There is also some atypia on the right.  This represents a  stage TIIA disease.   IMPRESSION:  This is a 69 year old male with a Gleason 6 (3 + 3)  adenocarcinoma of the prostate involving approximately 5% of the left-sided  biopsies.  Several forms of therapy have been discussed with [name]  including radiation, seed implantation, cryoablation as well as watchful  waiting.  [name] has decided to proceed with surgical management of his  disease.  He understands all of the risks and benefits and alternatives of  proceeding with a radical retropubic  prostatectomy and is willing to  proceed.  He is admitted at this time to undergo the aforementioned  procedure.   Dictated by Lynford Citizen, MD for Marshall Cork. Jeffie Pollock, M.D.      EG/MEDQ  D:  11/16/2004  T:  11/16/2004  Job:  [id number]

## 2011-03-02 NOTE — Progress Notes (Signed)
Luis Deleon 06/06/1941 MRN [id number]     History of Present Illness:  This is a 70 year old white male with Crohn's disease of the terminal ileum who is status post terminal ileal resection and recurrence of Crohn's disease at the ileocolic anastomoses as of  last colonoscopy in December 2007. He has a low-grade small bowel obstruction which usually resolves spontaneously. He had a severe attack of abdominal pain last week which was associated with vomiting and hiccups. He has been on full liquids and feels improved today. There has been no fever or rectal bleeding. He has chronic diarrhea. His medications include 6-MP 25 mg daily. There is a history of a ventral hernia repair. He had prostate cancer and kidney stones. His last CT scan of the abdomen in February 2011 showed a ventral hernia with mesh. The small bowel was normal.   Past Medical History  Diagnosis Date  . Prostate cancer   . Hyperlipemia   . Hypertension   . Diverticulosis   . Nephrolithiasis   . Gout   . Small bowel obstruction   . Crohn disease   . Polycythemia   . Abnormal LFTs (liver function tests)    Past Surgical History  Procedure Date  . Tonsillectomy   . Ventral hernia repair   . Lithotripsy   . Exploratory laparotomy w/ bowel resection     reports that he has quit smoking. He does not have any smokeless tobacco history on file. He reports that he does not drink alcohol or use illicit drugs. family history includes Cervical cancer in his mother and Heart disease in his father, maternal uncle, and mother.  There is no history of Colon cancer. Allergies  Allergen Reactions  . Colchicine     REACTION: nausea, diarrhea and interactions with 82m        Review of Systems: Positive for gastroesophageal reflux and hiccups, negative for shortness of breath or chest pain. Positive for diarrhea.  The remainder of the 10  point ROS is negative except as outlined in H&P   Physical Exam: General  appearance  Well developed, in no distress, overweight. Eyes- non icteric. HEENT nontraumatic, normocephalic. Mouth no lesions, tongue papillated, no cheilosis. Neck supple without adenopathy, thyroid not enlarged, no carotid bruits, no JVD. Lungs Clear to auscultation bilaterally. Cor normal S1 normal S2, regular rhythm , no murmur,  quiet precordium. Abdomen soft abdomen with tenderness to the right of the umbilicus. No mass affect. Liver edge at costal margin. Rectal: Soft Hemoccult negative stool. Extremities no pedal edema. Skin no lesions. Neurological alert and oriented x 3. Psychological normal mood and affect.  Assessment and Plan:   Problem #1 Acute abdominal pain which has now resolved consistent with a partial small bowel obstruction. I suspect either due to  recurrent Crohn's disease at the neoileum  or adhesive disease due to a prior ventral hernia repair. We will proceed with a CT scan of the abdomen and pelvis. He will need a complete metabolic panel and blood count prior to the exam. We will start him on Cipro 500 mg twice a day for one week. He would like to hold off on using steroids until we have the results of the CT scan. He will be due for a repeat colonoscopy later this year.  Problem #2 Crohn's disease at the ileocolic anastomosis. He is due for a colonoscopy 09/2011..Marland KitchenHe needs to continue 6-MP 25 mg daily. We may consider Entocort 9 mg daily although it may not be covered  by his insurance. Therefore, we will hold off.     03/02/2011 [name]

## 2011-03-02 NOTE — Patient Instructions (Addendum)
We will schedule your CT scan of the abdomen and pelvis. Please follow written instructions given to you. We have sent 28m to your pharmacy. Please continue 1/2 tablet by mouth once daily. Please take Prilosec 20 mg by mouth twice daily. We have sent a prescription to your pharmacy for this. We have sent a prescription for Cipro 500 mg to your pharmacy. Please take 1 tablet by mouth twice daily x 14 days. Your physician has requested that you go to the basement for the following lab work before leaving today: CMET, CBC, Sedimentation rate  DR D.MLaurance Flatten

## 2011-03-02 NOTE — Assessment & Plan Note (Signed)
 HEALTHCARE                           GASTROENTEROLOGY OFFICE NOTE   DINH, AYOTTE                   MRN:          [id number]  DATE:07/01/2006                            DOB:          [birth year]    Mr. 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  He has been on 6-  mecaptopurine 50 mg a day on daily basis without any side effects.  Last  appointment one year ago.  He is here today for a 1-year followup.  He  denies and abdominal pain.  He has occasional diarrhea once or twice a week,  no rectal bleeding.  His weight has been stable.  He has been able to work.   MEDICATIONS:  1. Benicar 40/12.5 daily.  2. Niaspan 1000 mg daily.  3. Vytorin 10/40 one p.o. daily.  4. Purinethol 50 mg p.o. daily.  5. Pyridoxine.  6. Aspirin 81 mg p.o. daily.   PHYSICAL EXAMINATION:  Blood pressure 106/72, pulse 72 and weight 222  pounds.  Patient was not examined.   We discussed his Crohn's disease, which currently is in remission.  He needs  to have blood tests at least every year, because he is on 6-MP, specifically  his B-12 level and his CBC.  He sees Dr. Laurance Flatten every 3 months.  Next time he  sees [place] in November, I have written him a prescription of the blood tests he  ought to have, with the results to be faxed to our office at [phone].  I  will see him again in one year.                                   Lowella Bandy. Olevia Perches, MD   DMB/MedQ  DD:  07/01/2006  DT:  07/02/2006  Job #:  [id number]   cc:   Chipper Herb, M.D.

## 2011-03-02 NOTE — Op Note (Signed)
NAME:  Luis Deleon, Luis Deleon                          ACCOUNT NO.:  [id number]   MEDICAL RECORD NO.:  [id number]                   PATIENT TYPE:  OBV   LOCATION:  1423                                 FACILITY:  [facility]   PHYSICIAN:  Coralie Keens, M.D.              DATE OF BIRTH:  23-Apr-1941   DATE OF PROCEDURE:  05/12/2003  DATE OF DISCHARGE:                                 OPERATIVE REPORT   PREOPERATIVE DIAGNOSIS:  Ventral incisional hernia.   POSTOPERATIVE DIAGNOSIS:  Ventral incisional hernia.   PROCEDURE:  Laparoscopic ventral hernia repair with mesh.   SURGEON:  Douglas A. Ninfa Linden, M.D.   ANESTHESIA:  General endotracheal.   ESTIMATED BLOOD LOSS:  Minimal.   FINDINGS:  The patient was found to have a moderate-sized ventral hernia in  the upper abdomen.  It was repaired by an 18 x 24 cm piece of Gore-Tex dual  mesh.  It was bolstered on the left lateral edge with 10 x 5 cm piece of  mesh.   PROCEDURE IN DETAIL:  The patient was brought to the operating room and  identified as Luis Deleon.  He was placed supine on the operating table,  and general anesthesia was induced.  His abdomen was then prepped and draped  in the usual sterile fashion.  Using a #15 blade, a small transverse  incision was made in the patient's left flank.  The incision was then  carried down to the fascia which was then opened with a scalpel.  The  underlying muscle fibers were then dissected bluntly, and the peritoneum was  identified and opened further with the scalpel.  Next, a 0 Vicryl  pursestring suture was placed around the fascial opening.  The Hasson port  was placed into the opening and insufflation of the abdomen was begun.  The  patient was found to have a moderate-sized ventral hernia containing some  omentum and small bowel.  A 5 mm port was then placed in the patient's left  upper quadrant under direct vision.  Using blunt dissection, the omentum and  bowel were easily pulled out of the  hernia defect.  The edges of the fascial  defect were then identified circumferentially.  Next, a separate 5 mm port  was placed in the patient's right lower quadrant.  An 18 x 24 piece of Gore-  Tex dual mesh was then brought onto the field and fashioned.  Four separate  0 Novofil pop-off sutures were placed in the corners of the mesh.  The mesh  was then rolled up and placed into the abdomen through the camera port.  The  mesh was then unrolled under direct vision.  A suture passer was then used  to pull each of the sutures in the four quadrants up through the abdominal  wall and fascia and out through the skin.  This was done under direct vision  after incision with a #  11 blade.  Once all of the pieces of suture were  pulled up, the sutures were pulled tight, and the mesh was pulled up to the  abdominal wall covering the fascial defect.  The sutures were then tied in  place.  A surgical tacker was then used to circumferentially tack the mesh  to the abdominal wall.  __________ the second 5 mm port was placed in the  patient's right upper quadrant under direct vision.  Once the mesh was  tacked in place circumferentially, it was found to be closer than expected  to the left lateral fascial edge.  A piece of 8 x 5 cm left over piece of  Gore-Tex dual mesh was then brought onto the field, placed through the port,  and then tacked up to this left lateral edge to bolster the edge more.  At  this point, excellent coverage of the hernia defect appeared to be achieved.  The abdomen was then slowly deflated as the 5 mm ports were removed.  The  mesh appeared to collapse appropriately with the abdominal wall.  The Hasson  port was then removed, and then the 0 Vicryl was tied in place there,  closing the fascial defect tightly.  All port sites and incisions were  anesthetized with 0.25% Marcaine and then closed with 4-0 Monocryl  subcuticular sutures.  Steri-Strips, gauze, and tape were then  applied.  The  patient tolerated the procedure well.  All sponge, needle, and instrument  counts were correct at the end of the procedure.  The patient was then  extubated in the operating room and taken in stable condition to the  recovery room.                                               Coralie Keens, M.D.    DB/MEDQ  D:  05/12/2003  T:  05/12/2003  Job:  [id number]

## 2011-03-02 NOTE — Discharge Summary (Signed)
NAME:  Luis Deleon, Luis Deleon                          ACCOUNT NO.:  [id number]   MEDICAL RECORD NO.:  [id number]                   PATIENT TYPE:  INP   LOCATION:  5409                                 FACILITY:  [facility]   PHYSICIAN:  Douglas A. Ninfa Linden, M.D.           DATE OF BIRTH:  12/03/1940   DATE OF ADMISSION:  05/20/2002  DATE OF DISCHARGE:  05/30/2002                                 DISCHARGE SUMMARY   DISCHARGE DIAGNOSIS:  Small bowel obstruction from Crohn's disease, status  post exploratory laparotomy and small bowel resection.   HISTORY OF PRESENT ILLNESS:  The patient is a 70 year old gentleman known to  Dr. [name] as well as Dr. Morrie Sheldon with a history of Crohn's ileitis  who presented with abdominal pain, nausea, and vomiting.  He was admitted by  Dr. Michela Pitcher with a diagnosis of small bowel obstruction.  He was  admitted from Dr. Cleaster Corin office.  He was then seen by Dr. Scarlette Shorts.  Nasogastric tube was placed, and surgical consultation was obtained.  Dr.  Neldon Mc saw the patient on 05/21/02.  The decision at that time was  made to proceed to the operating room for exploration if the small bowel  obstruction did not improve.   HOSPITAL COURSE:  The patient was admitted.  NG tube was placed, and  nasogastric suctioning was instituted.  By 05/22/02, the patient showed  worsening of the small bowel obstruction on plain x-rays.  So the decision  was made to proceed to the operating room.  Dr. Margot Chimes passed the patient  off to myself who took the patient to the operating room and did an  exploratory laparotomy with a small bowel resection.  The patient tolerated  the procedure well, was taken in stable condition to the surgical floor.  By  postoperative day #1, he had no nausea, he did have a few bowel sounds, and  at that time he was started on sips and chips.  Again, a nasogastric tube  actually was not placed secondary to deviated nasal septum.  On  postoperative day #3, he did have a temperature of 102 and increased white  blood cell count.  He also had some erythema around his wound, so his wound  was opened up and a small wound infection was found.  On postoperative day  #4, after changing to Unasyn his white blood cell count and temperatures  were down.  Dressing changes again were instituted, and he was started on a  clear liquid diet.  On postoperative day #5, he was continuing to tolerate  clear liquids without problems, although he was having no flatus.  Over the  next several days he was advanced to a full liquid diet, but did not begin  passing flatus until late on postoperative day #7, after having  suppositories placed.  He indicated having multiple bowel movements.  By  postoperative day #  8, was doing much better.  His wound drainage was being  controlled with dry dressing changes.  He was having bowel movements.  The  decision was made to discharge the patient home.   DISCHARGE DIAGNOSIS:  Small bowel obstruction, status post exploratory  laparotomy with small bowel resection for Crohn's disease.   DIET:  Regular.   ACTIVITY:  He is to do no heavy lifting greater than 20 pounds for six  weeks.  He may shower.   WOUND CARE:  He will be undergoing b.i.d. wet-to-dry dressing changes.   DISCHARGE MEDICATIONS:  1. Keflex one p.o. t.i.d. x5 days.  2. Tylox p.r.n. pain.  3.     Ibuprofen p.r.n.  4. He will resume his home medications.   FOLLOWUP:  He will follow up in my office in one week post discharge.                                               Douglas A. Ninfa Linden, M.D.    DAB/MEDQ  D:  06/18/2002  T:  06/19/2002  Job:  [id number]

## 2011-03-02 NOTE — Consult Note (Signed)
NAME:  Luis Deleon, Luis Deleon                          ACCOUNT NO.:  [id number]   MEDICAL RECORD NO.:  [id number]                   PATIENT TYPE:  INP   LOCATION:  5409                                 FACILITY:  [facility]   PHYSICIAN:  Haywood Lasso, M.D.           DATE OF BIRTH:  1941-04-08   DATE OF CONSULTATION:  05/21/2002  DATE OF DISCHARGE:                           GENERAL SURGERY CONSULTATION   REASON FOR CONSULTATION:  Small bowel obstruction.   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  About a week ago he  developed abdominal pain which was crampy, across his entire abdomen, but  more in the middle, would wax and wane, and was associated with some nausea,  but no significant vomiting.  He really quit having bowel movements shortly  after his pain began, he has not had any since then, but has not eaten  anything except for a few clear liquids since the onset of his pain.  He  presented to the [place] office yesterday, and was admitted with  abdominal distention and what was thought to be a small bowel obstruction.   The patient feels a little bit better today.  Attempted placement of a NG  tube was not successful.  He has passed a little bit of gas overnight.   PAST MEDICAL HISTORY:  1. Tonsillectomy.  2. Lithotripsy.   CURRENT MEDICATIONS:  1. Hytrin.  2. Zocor.  3. Vitamin E.  4. Aspirin.   ALLERGIES:  No known drug allergies.   REVIEW OF SYMPTOMS:  Positive for hypertension and hyperlipidemia, in  addition to his kidney stones.   FAMILY HISTORY:  Father dying of congestive failure.  Mother has also had  heart disease.   PHYSICAL EXAMINATION:  GENERAL:  The patient is a healthy-appearing  gentleman who does not appear  acutely ill today.  VITAL SIGNS:  Stable since admission.  He has been afebrile.  HEENT:  Normocephalic.  Eyes are nonicteric.  Pupils are round and regular.  NECK:  Supple, no masses or thyromegaly.  LUNGS:  Clear to auscultation.  HEART:  Regular, no murmurs, rubs, or gallops.  ABDOMEN:  Soft, but perhaps a little distended.  He has minimal tenderness  and this seems to be just a little bit to the left of the umbilicus.  There  is no rebound.  Bowel sounds are a little bit hyperactive at times, and  other times quiet.  RECTAL:  Not done today.  EXTREMITIES:  No cyanosis or edema.   LABORATORY DATA:  White blood cell count of 8000.  His initial hemoglobin is  18, suggesting some volume depletion.  Potassium today has drifted down to  2.9.  I reviewed the films, and he has marked small bowel distention on  yesterday's film, perhaps a little bit more small bowel distention and a  little less colon gas on today's films.  No perforations.   IMPRESSION:  1. Small bowel obstruction, probably secondary to Crohn's.  2. A long history of Crohn's.  3. Volume depletion secondary to #1.  4. Hypokalemia secondary to #1.  5. History of hypertension.  6. History of hyperlipidemia.  7. History of kidney stones.   RECOMMENDATIONS:  I think at this point this man is going to need surgical  intervention.  My assumption is that his obstruction is secondary to his  Crohn's disease, although he could have another cause, either neoplastic or  another area of Crohn's different from what had been previously diagnosed in  the terminal ileum.  His hypokalemia is currently being treated.  We could  also offer him some non-operative therapy for a day or two to see if he  improves, but I think overall he is going to require some sort of surgical  intervention, and it would be appropriate to think about proceeding in the  next day or two once his potassium is stabilized.  He is agreeable to doing  that,  and I will follow up with the GI service prior to making a final  decision to see if there is any thought that he ought to be treated  preoperatively with some steroids or other anti-Crohn's therapy prior to  embarking on a surgical intervention.                                               Haywood Lasso, M.D.    CJS/MEDQ  D:  05/21/2002  T:  05/26/2002  Job:  [id number]   cc:   Clarene Reamer, M.D. [facility]

## 2011-03-02 NOTE — H&P (Signed)
NAME:  Luis Deleon, Luis Deleon                          ACCOUNT NO.:  [id number]   MEDICAL RECORD NO.:  [id number]                   PATIENT TYPE:  INP   LOCATION:  2774                                 FACILITY:  [facility]   PHYSICIAN:  Clarene Reamer, M.D.             DATE OF BIRTH:  30-Jun-1941   DATE OF ADMISSION:  05/20/2002  DATE OF DISCHARGE:                                HISTORY & PHYSICAL   CHIEF COMPLAINT:  Acute abdominal pain mid abdomen times six days with  nausea.   HISTORY OF PRESENT ILLNESS:  The patient is a 70 year old white male well  known to Dr. [name] and Dr. Morrie Sheldon with history of Crohn's ileitis  who was hospitalized twice in 1984 with partial small bowel obstructions,  but did not require surgery. He also has a history of ureterolithiasis and  is status post lithotripsy and ureteral stenting in late 1980s. The patient  was last seen by Dr. [name] approximately three years ago and has been  in remission and off medication over the past three years from a Crohn's  standpoint. His last colonoscopy was in 1995; this was consistent with mild  active terminal ileitis. He also had small bowel follow-through in 1995  which showed an abnormal segment approximately 25 cm in the distal ileum  with narrowing, felt secondary to burned out Crohn's. The terminal ileum  itself was also narrowed at the ileocecal valve.   The patient had been feeling well; says that his energy level has been good.  He has had no ongoing complaints of abdominal pain. He normally has 2-3  bowel movements per day. He had the acute onset of current symptoms on  Friday, August 1st with midabdominal crampy pain which became progressively  more severe and wave-like varying in intensity. He initially had some  diarrhea that day, no melena or hematochezia and has not had a bowel  movement since that time. He has been having some sweats off and on, but no  documented fever. He says he has been  nauseated but has not had any vomiting  and has not been eating over the past five days, just drinking small amounts  of water and one Frosty.   The patient was seen in Dr. Timmothy Sours Moore's office two days ago and then seen as  an urgent add-on in our office and noted to be acutely ill, and admitted for  further diagnostic workup and hydration with probable small bowel  obstruction.   CURRENT MEDICATIONS:  1. Hytrin 5 mg p.o. q.d.  2. Zocor 20 mg p.o. q.d.  3. Vitamin E daily.  4. Aspirin 81 mg q.d.  5. Vicodin p.r.n. over the past few days.   ALLERGIES:  No known drug allergies.   PAST MEDICAL HISTORY:  1. Pertinent for remote tonsillectomy.  2. Ureterolithiasis.  3. Crohn's ileitis.  4. Hypertension.  5. Hyperlipidemia.   PAST  SURGICAL HISTORY:  No prior surgeries.   FAMILY HISTORY:  Father deceased at 94 with congestive heart failure.  Paternal uncle is also deceased with heart disease.   SOCIAL HISTORY:  The patient is married. He is employed currently with [employer] in [place]. He is a retired Engineer, structural. He is an ex-  smoker over the past ten years; no ETOH.   REVIEW OF SYMPTOMS:  CARDIOVASCULAR:  He denies any chest pain or anginal  symptoms. PULMONARY:  Negative for cough, shortness of breath or sputum  production. GENITOURINARY:  Denies any dysuria, urgency or frequency.  MUSCULOSKELETAL:  Denies any myalgias or arthralgias.  GI:  As above.   PHYSICAL EXAMINATION:  GENERAL:  Well-developed white male, ill-appearing,  alert and oriented x3.  VITAL SIGNS:  Temperature is 98.4, blood pressure 122/80, pulse in the 90s.  HEENT:  Nontraumatic, normocephalic. EOMI. PERRLA. Sclerae anicteric. Buccal  mucosa is dry.  CARDIOVASCULAR:  Regular rate and rhythm with S1 and S2, no murmur, rub or  gallop.  PULMONARY:  Clear to A&P.  ABDOMEN:  Abdomen is somewhat distended, bowel sounds are high pitched with  occasional rushes. He is tender rather diffusely  especially in the mid  abdomen. He does have rebound and early guarding. No palpable mass or  hepatosplenomegaly.  RECTAL:  Exam not done today.  EXTREMITIES:  No clubbing, cyanosis or edema.  SKIN:  Turgor is poor.  NEUROLOGIC:  Grossly nonfocal.   LABORATORY AND ACCESSORY DATA:  Laboratory studies were pending on  admission. Labs per Dr. Zachery Dauer office on May 15, 2002 showed a WBC of  8.2, hemoglobin 18.2, hematocrit 54.6 and an MCV of 94.7.   IMPRESSION:  1. 70 year old white male with acute abdominal pain times six days     associated with nausea and anorexia, rule out small bowel obstruction     secondary to active Crohn's versus stricture.  2. Dehydration secondary to above.  3. History of Crohn's disease since 1984, no prior surgeries, in remission     times three years.  4. History of ureterolithiasis.  5. Hypertension.  6. Hyperlipidemia.   PLAN:  The patient is admitted to the service of Dr. Scarlette Shorts who is  covering the hospital. He will be rehydrated. Will obtain baseline labs,  keep him NPO. Check abdominal films and possible CT of the abdomen and  pelvis. I suspect he will require surgical intervention. For details, please  see the orders.        Nicoletta Ba, [facility]                    Clarene Reamer, M.D.    AE/MEDQ  D:  05/22/2002  T:  05/27/2002  Job:  [id number]

## 2011-03-05 ENCOUNTER — Telehealth: Payer: Self-pay | Admitting: *Deleted

## 2011-03-05 LAB — SEDIMENTATION RATE: Sed Rate: 7 mm/hr (ref 0–22)

## 2011-03-05 NOTE — Telephone Encounter (Signed)
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 I think I sent him to somebody, if not, then please set up an appointment for himt to see a hematologist.

## 2011-03-05 NOTE — Telephone Encounter (Signed)
Received a fax from [facility] that patient is scheduled with Dr. Jamse Arn on 03/07/11 at 10:30 AM for appointment  Re: increased RBC

## 2011-03-05 NOTE — Telephone Encounter (Signed)
Spoke with patient and gave him lab results. Per patient he has not seen a hematologist. Referred and records sent to [facility]

## 2011-03-06 ENCOUNTER — Ambulatory Visit (INDEPENDENT_AMBULATORY_CARE_PROVIDER_SITE_OTHER)
Admission: RE | Admit: 2011-03-06 | Discharge: 2011-03-06 | Disposition: A | Discharge status: Home / Self Care | Payer: Medicare Other | Source: Ambulatory Visit | Attending: Internal Medicine | Admitting: Internal Medicine

## 2011-03-06 DIAGNOSIS — K5090 Crohn's disease, unspecified, without complications: Secondary | ICD-10-CM

## 2011-03-06 MED ORDER — IOHEXOL 300 MG/ML  SOLN
100.0000 mL | Freq: Once | INTRAMUSCULAR | Status: AC | PRN
  Administered 2011-03-06: 100 mL via INTRAVENOUS

## 2011-03-06 NOTE — Telephone Encounter (Signed)
Reviewed and agree.

## 2011-03-07 ENCOUNTER — Other Ambulatory Visit: Payer: Self-pay | Admitting: Hematology and Oncology

## 2011-03-07 ENCOUNTER — Encounter: Payer: Medicare Other | Admitting: Hematology and Oncology

## 2011-03-07 ENCOUNTER — Encounter (HOSPITAL_BASED_OUTPATIENT_CLINIC_OR_DEPARTMENT_OTHER): Payer: Medicare Other | Admitting: Hematology and Oncology

## 2011-03-07 DIAGNOSIS — K5000 Crohn's disease of small intestine without complications: Secondary | ICD-10-CM

## 2011-03-07 DIAGNOSIS — D751 Secondary polycythemia: Secondary | ICD-10-CM

## 2011-03-07 DIAGNOSIS — Z9049 Acquired absence of other specified parts of digestive tract: Secondary | ICD-10-CM

## 2011-03-07 DIAGNOSIS — Z8546 Personal history of malignant neoplasm of prostate: Secondary | ICD-10-CM

## 2011-03-07 LAB — CBC WITH DIFFERENTIAL/PLATELET
Basophils Absolute: 0 10*3/uL (ref 0.0–0.1)
EOS%: 0.9 % (ref 0.0–7.0)
HCT: 49.7 % (ref 38.4–49.9)
HGB: 17.2 g/dL — ABNORMAL HIGH (ref 13.0–17.1)
MCH: 32.7 pg (ref 27.2–33.4)
MCV: 94.5 fL (ref 79.3–98.0)
NEUT%: 83 % — ABNORMAL HIGH (ref 39.0–75.0)
lymph#: 0.9 10*3/uL (ref 0.9–3.3)

## 2011-03-07 LAB — MORPHOLOGY: PLT EST: ADEQUATE

## 2011-03-08 ENCOUNTER — Encounter (INDEPENDENT_AMBULATORY_CARE_PROVIDER_SITE_OTHER): Payer: Self-pay | Admitting: General Surgery

## 2011-03-08 LAB — COMPREHENSIVE METABOLIC PANEL
ALT: 28 U/L (ref 0–53)
Alkaline Phosphatase: 49 U/L (ref 39–117)
Creatinine, Ser: 1.66 mg/dL — ABNORMAL HIGH (ref 0.40–1.50)
Sodium: 141 mEq/L (ref 135–145)
Total Bilirubin: 1.2 mg/dL (ref 0.3–1.2)
Total Protein: 6.5 g/dL (ref 6.0–8.3)

## 2011-03-08 LAB — FERRITIN: Ferritin: 118 ng/mL (ref 22–322)

## 2011-03-08 LAB — IRON AND TIBC
%SAT: 19 % — ABNORMAL LOW (ref 20–55)
TIBC: 383 ug/dL (ref 215–435)
UIBC: 309 ug/dL

## 2011-03-08 LAB — LACTATE DEHYDROGENASE: LDH: 132 U/L (ref 94–250)

## 2011-03-09 ENCOUNTER — Telehealth: Payer: Self-pay | Admitting: *Deleted

## 2011-03-09 ENCOUNTER — Other Ambulatory Visit: Payer: Self-pay | Admitting: Hematology and Oncology

## 2011-03-09 ENCOUNTER — Encounter (HOSPITAL_BASED_OUTPATIENT_CLINIC_OR_DEPARTMENT_OTHER): Payer: Medicare Other | Admitting: Hematology and Oncology

## 2011-03-09 DIAGNOSIS — D751 Secondary polycythemia: Secondary | ICD-10-CM

## 2011-03-09 NOTE — Telephone Encounter (Signed)
Message copied by Hulan Saas on Fri Mar 09, 2011  9:31 AM ------      Message from: Lafayette Dragon      Created: Thu Mar 08, 2011 10:19 PM       CT scan shows long segment of sigmoid colon which appears thickened, ? Crohn's vs spasm?. Please call pt with results and my recommendation to proceed with a colonoscopy to  Evaluate  abnormal appearing sigmoid colon.

## 2011-03-09 NOTE — Telephone Encounter (Signed)
Spoke with patient's wife and gave her the results. She states the patient will call back next week to schedule his colonoscopy.

## 2011-03-14 LAB — JAK-2 V617F

## 2011-03-15 ENCOUNTER — Telehealth: Payer: Self-pay | Admitting: *Deleted

## 2011-03-15 DIAGNOSIS — R933 Abnormal findings on diagnostic imaging of other parts of digestive tract: Secondary | ICD-10-CM

## 2011-03-15 NOTE — Telephone Encounter (Signed)
Spoke with patient's wife and scheduled the colonoscopy on 03/22/11 with 9:30 AM arrival for 10:30 AM procedure. Pre visit on 03/16/11 at 2:00 PM.

## 2011-03-15 NOTE — Telephone Encounter (Signed)
Message copied by Hulan Saas on Thu Mar 15, 2011  9:48 AM ------      Message from: Hulan Saas      Created: Fri Mar 09, 2011  9:35 AM       Did patient call and schedule colonoscopy?

## 2011-03-19 ENCOUNTER — Ambulatory Visit (AMBULATORY_SURGERY_CENTER): Payer: Medicare Other | Admitting: *Deleted

## 2011-03-19 VITALS — Ht 69.0 in | Wt 235.1 lb

## 2011-03-19 DIAGNOSIS — R933 Abnormal findings on diagnostic imaging of other parts of digestive tract: Secondary | ICD-10-CM

## 2011-03-19 MED ORDER — PEG-KCL-NACL-NASULF-NA ASC-C 100 G PO SOLR: ORAL | Status: DC

## 2011-03-22 ENCOUNTER — Encounter: Payer: Self-pay | Admitting: Internal Medicine

## 2011-03-22 ENCOUNTER — Ambulatory Visit (AMBULATORY_SURGERY_CENTER): Payer: Medicare Other | Admitting: Internal Medicine

## 2011-03-22 VITALS — HR 73 | Temp 97.9°F | Resp 14 | Ht 69.0 in | Wt 230.0 lb

## 2011-03-22 DIAGNOSIS — K620 Anal polyp: Secondary | ICD-10-CM

## 2011-03-22 DIAGNOSIS — K5010 Crohn's disease of large intestine without complications: Secondary | ICD-10-CM

## 2011-03-22 DIAGNOSIS — K5730 Diverticulosis of large intestine without perforation or abscess without bleeding: Secondary | ICD-10-CM

## 2011-03-22 DIAGNOSIS — D126 Benign neoplasm of colon, unspecified: Secondary | ICD-10-CM

## 2011-03-22 DIAGNOSIS — R109 Unspecified abdominal pain: Secondary | ICD-10-CM

## 2011-03-22 DIAGNOSIS — K5000 Crohn's disease of small intestine without complications: Secondary | ICD-10-CM

## 2011-03-22 MED ORDER — SODIUM CHLORIDE 0.9 % IV SOLN: 500.0000 mL | INTRAVENOUS | Status: DC

## 2011-03-22 NOTE — Progress Notes (Signed)
Pressure applied to the abdomen to reach the cecum

## 2011-03-23 ENCOUNTER — Telehealth: Payer: Self-pay

## 2011-03-23 NOTE — Telephone Encounter (Signed)

## 2011-03-27 ENCOUNTER — Encounter: Payer: Self-pay | Admitting: Internal Medicine

## 2011-04-02 ENCOUNTER — Other Ambulatory Visit: Payer: Self-pay | Admitting: Internal Medicine

## 2011-07-10 LAB — BASIC METABOLIC PANEL
BUN: 14
CO2: 27
Calcium: 9.2
Creatinine, Ser: 1.21
GFR calc non Af Amer: >60
Glucose, Bld: 98

## 2011-08-15 ENCOUNTER — Encounter (HOSPITAL_BASED_OUTPATIENT_CLINIC_OR_DEPARTMENT_OTHER): Payer: Medicare Other | Admitting: Hematology and Oncology

## 2011-08-15 ENCOUNTER — Other Ambulatory Visit: Payer: Self-pay | Admitting: Hematology and Oncology

## 2011-08-15 DIAGNOSIS — Z8546 Personal history of malignant neoplasm of prostate: Secondary | ICD-10-CM

## 2011-08-15 DIAGNOSIS — D751 Secondary polycythemia: Secondary | ICD-10-CM

## 2011-08-15 LAB — CBC WITH DIFFERENTIAL/PLATELET
BASO%: 0 % (ref 0.0–2.0)
HCT: 49.4 % (ref 38.4–49.9)
LYMPH%: 16.3 % (ref 14.0–49.0)
MCH: 31.9 pg (ref 27.2–33.4)
MCHC: 33.8 g/dL (ref 32.0–36.0)
MCV: 94.3 fL (ref 79.3–98.0)
MONO#: 0.8 10*3/uL (ref 0.1–0.9)
MONO%: 11.2 % (ref 0.0–14.0)
NEUT%: 71.2 % (ref 39.0–75.0)
Platelets: 172 10*3/uL (ref 140–400)
RBC: 5.24 10*6/uL (ref 4.20–5.82)
WBC: 7.4 10*3/uL (ref 4.0–10.3)

## 2011-08-22 ENCOUNTER — Telehealth (INDEPENDENT_AMBULATORY_CARE_PROVIDER_SITE_OTHER): Payer: Self-pay

## 2011-08-22 ENCOUNTER — Other Ambulatory Visit (INDEPENDENT_AMBULATORY_CARE_PROVIDER_SITE_OTHER): Payer: Self-pay

## 2011-08-22 DIAGNOSIS — E042 Nontoxic multinodular goiter: Secondary | ICD-10-CM

## 2011-08-22 NOTE — Telephone Encounter (Signed)
PT NOTIFIED THAT PER LAST NOTE PT DUE FOR THYROID ULTRASOUND PRIOR TO 12-10 OV. ORDER PLACED AND SENT TO [facility] TO SCHEDULE.

## 2011-09-19 ENCOUNTER — Ambulatory Visit
Admission: RE | Admit: 2011-09-19 | Discharge: 2011-09-19 | Disposition: A | Discharge status: Home / Self Care | Payer: Medicare Other | Source: Ambulatory Visit | Attending: General Surgery | Admitting: General Surgery

## 2011-09-19 DIAGNOSIS — E042 Nontoxic multinodular goiter: Secondary | ICD-10-CM

## 2011-09-24 ENCOUNTER — Encounter (INDEPENDENT_AMBULATORY_CARE_PROVIDER_SITE_OTHER): Payer: Self-pay | Admitting: General Surgery

## 2011-09-24 ENCOUNTER — Ambulatory Visit (INDEPENDENT_AMBULATORY_CARE_PROVIDER_SITE_OTHER): Payer: Medicare Other | Admitting: General Surgery

## 2011-09-24 VITALS — BP 132/88 | HR 94 | Temp 98.8°F | Resp 16 | Ht 69.0 in | Wt 240.4 lb

## 2011-09-24 DIAGNOSIS — E042 Nontoxic multinodular goiter: Secondary | ICD-10-CM

## 2011-09-24 NOTE — Patient Instructions (Signed)
Your thyroid ultrasound shows that the nodules in your thyroid gland are stable or slightly smaller in size. None of the nodules has enlarged. 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 There is no indication for surgery at this point.  Return to see me in 2 years and we will get blood work and a thyroid ultrasound at that time. Please tell me or Dr. Laurance Flatten if you develop any increasing pain or pressure in your neck.

## 2011-09-24 NOTE — Progress Notes (Signed)
Patient ID: Luis Deleon, male   DOB: [birth year], 70 y.o.   MRN: [id number]  Chief Complaint  Patient presents with  . Follow-up    f/u thyroid    HPI Luis Deleon is a 70 y.o. male.  He is followed by Dr. Morrie Sheldon in [facility]. I am seeing him for a one-year long-term followup regarding multinodular goiter.  Since I last saw Mr. Formisano, there are no new neck or thyroid symptoms. He occasionally feels a little bit of discomfort in his neck, but has noticed no pressure on his trachea or esophagus. He is swallowing normally. He has normal voice.  Followup ultrasound of the thyroid was performed on September 19, 2011. This shows multiple bilateral thyroid nodules which are stable. The larger lesion in the left lobe measures 3.5 x 1.6 x 1.7 cm which is minimally enlarged. A solid nodule right measures 1.9 x 1.4 x 1.5 cm which is perhaps slightly smaller. No adenopathy is visualized.  In terms of his other medical problems he continues to get periodic colonoscopy and followup with Dr. [name] regarding his Crohn's disease. This is not causing much problems at present.  He also apparently has been seen by Dr. Jamse Arn for polycythemia and has donated blood a couple times for this and  his blood count has come back to normal.   Dr. Laurance Flatten has adjusted his Benicar dose downward but otherwise usual noted medications. HPI  Past Medical History  Diagnosis Date  . Prostate cancer   . Hyperlipemia   . Hypertension   . Diverticulosis   . Nephrolithiasis   . Gout   . Small bowel obstruction   . Crohn disease   . Polycythemia   . Abnormal LFTs (liver function tests)   . Arthritis     Past Surgical History  Procedure Date  . Tonsillectomy   . Ventral hernia repair     2 times  . Lithotripsy   . Exploratory laparotomy w/ bowel resection   . Prostatectomy   . Appendectomy     Family History  Problem Relation Age of Onset  . Heart disease Mother   . Cervical cancer  Mother   . Cancer Mother     cervical  . Heart disease Father   . Heart disease Maternal Uncle   . Colon cancer Neg Hx     Social History History  Substance Use Topics  . Smoking status: Former Smoker    Quit date: 03/19/1991  . Smokeless tobacco: Current User    Types: Chew  . Alcohol Use: No    Allergies  Allergen Reactions  . Colchicine     REACTION: nausea, diarrhea and interactions with 7m    Current Outpatient Prescriptions  Medication Sig Dispense Refill  . aspirin 81 MG EC tablet Take 81 mg by mouth daily.        . Cholecalciferol (VITAMIN D3) 1000 UNITS CAPS Take 1 capsule by mouth daily.        .Marland Kitchengemfibrozil (LOPID) 600 MG tablet Take 600 mg by mouth. One every three days       . LIPITOR 40 MG tablet 20 mg daily.       . mercaptopurine (PURINETHOL) 50 MG tablet 25 mg daily. Take 1/2 tablet by mouth daily.       .Marland Kitchenolmesartan-hydrochlorothiazide (BENICAR HCT) 40-12.5 MG per tablet Take 0.5 tablets by mouth daily.        . potassium chloride SA (K-DUR,KLOR-CON) 20 MEQ tablet  Take 20 mEq by mouth daily.         Current Facility-Administered Medications  Medication Dose Route Frequency Provider Last Rate Last Dose  . 0.9 %  sodium chloride infusion  500 mL Intravenous Continuous Lafayette Dragon, MD        Review of Systems Review of Systems  Constitutional: Negative for fever, chills and unexpected weight change.  HENT: Positive for neck pain. Negative for hearing loss, congestion, sore throat, trouble swallowing and voice change.   Eyes: Negative for visual disturbance.  Respiratory: Negative for cough and wheezing.   Cardiovascular: Negative for chest pain, palpitations and leg swelling.  Gastrointestinal: Negative for nausea, vomiting, abdominal pain, diarrhea, constipation, blood in stool, abdominal distention, anal bleeding and rectal pain.  Genitourinary: Negative for hematuria and difficulty urinating.  Musculoskeletal: Negative for arthralgias.  Skin:  Negative for rash and wound.  Neurological: Negative for seizures, syncope, weakness and headaches.  Hematological: Negative for adenopathy. Does not bruise/bleed easily.  Psychiatric/Behavioral: Negative for confusion.    Blood pressure 132/88, pulse 94, temperature 98.8 F (37.1 C), temperature source Temporal, resp. rate 16, height 5' 9"  (1.753 m), weight 240 lb 6.4 oz (109.045 kg).  Physical Exam Physical Exam  Constitutional: He is oriented to person, place, and time. He appears well-developed and well-nourished. No distress.  HENT:  Head: Normocephalic.  Nose: Nose normal.  Neck: Normal range of motion. Neck supple. No JVD present. No tracheal deviation present. No thyromegaly present.  Cardiovascular: Normal rate, regular rhythm, normal heart sounds and intact distal pulses.   No murmur heard. Pulmonary/Chest: Effort normal and breath sounds normal. No stridor. No respiratory distress. He has no wheezes. He has no rales. He exhibits no tenderness.  Abdominal: Soft.  Lymphadenopathy:    He has no cervical adenopathy.  Neurological: He is alert and oriented to person, place, and time. He has normal reflexes.  Skin: Skin is warm and dry. No rash noted. He is not diaphoretic. No erythema. No pallor.  Psychiatric: He has a normal mood and affect. His behavior is normal. Judgment and thought content normal.    Data Reviewed I reviewed all of my old records. I have reviewed his recent thyroid ultrasound.  Assessment    Nontoxic, benign multinodular goiter.  Physical exam and thyroid ultrasound stable. There is no indication for surgical intervention.  Prior fine needle aspiration cytology benign.  Hypertension  Crohn's disease.    Plan    The patient will return to see me in 2 years, and we will perform physical exam, TSH level, and thyroid ultrasound at that time.       Aixa Corsello M 09/24/2011, 10:00 AM

## 2011-10-16 DIAGNOSIS — C801 Malignant (primary) neoplasm, unspecified: Secondary | ICD-10-CM

## 2011-10-16 HISTORY — DX: Malignant (primary) neoplasm, unspecified: C80.1

## 2011-11-23 ENCOUNTER — Other Ambulatory Visit: Payer: Self-pay | Admitting: Internal Medicine

## 2011-11-27 ENCOUNTER — Other Ambulatory Visit: Payer: Self-pay | Admitting: Internal Medicine

## 2011-11-27 DIAGNOSIS — K5090 Crohn's disease, unspecified, without complications: Secondary | ICD-10-CM

## 2011-11-28 ENCOUNTER — Encounter: Payer: Self-pay | Admitting: *Deleted

## 2011-12-07 ENCOUNTER — Ambulatory Visit: Payer: Medicare Other | Admitting: Internal Medicine

## 2011-12-18 ENCOUNTER — Other Ambulatory Visit (INDEPENDENT_AMBULATORY_CARE_PROVIDER_SITE_OTHER): Payer: Medicare Other

## 2011-12-18 ENCOUNTER — Ambulatory Visit (INDEPENDENT_AMBULATORY_CARE_PROVIDER_SITE_OTHER): Payer: Medicare Other | Admitting: Internal Medicine

## 2011-12-18 ENCOUNTER — Encounter: Payer: Self-pay | Admitting: Internal Medicine

## 2011-12-18 DIAGNOSIS — K56609 Unspecified intestinal obstruction, unspecified as to partial versus complete obstruction: Secondary | ICD-10-CM

## 2011-12-18 DIAGNOSIS — K5090 Crohn's disease, unspecified, without complications: Secondary | ICD-10-CM

## 2011-12-18 DIAGNOSIS — K56600 Partial intestinal obstruction, unspecified as to cause: Secondary | ICD-10-CM

## 2011-12-18 DIAGNOSIS — D849 Immunodeficiency, unspecified: Secondary | ICD-10-CM

## 2011-12-18 MED ORDER — MERCAPTOPURINE 50 MG PO TABS: 50.0000 mg | ORAL_TABLET | ORAL | Status: DC

## 2011-12-18 MED ORDER — HYDROMORPHONE HCL 2 MG PO TABS: 2.0000 mg | ORAL_TABLET | Freq: Four times a day (QID) | ORAL | Status: AC | PRN

## 2011-12-18 NOTE — Progress Notes (Signed)
Luis Deleon 10-20-1940 MRN [id number]   History of Present Illness:  This is a 72 year old white male with episodes of severe cramping, abdominal pain which come and goes and lasts about 12 hours, suggestive of partial small bowel obstruction. He has a history of Crohn's disease since 69. He is status post terminal ileal resection in 2001, status post repair of ventral abdominal hernia with mesh in 2003 and again in 2011 by Dr Luis Deleon. The abdominal pain is severe and is associated with nausea but no vomiting. He usually stops having bowel movements and then has diarrhea as the obstruction gets relieved. His last colonoscopy in December 2007 and again in June 2012 showed a widely open ileocolic anastomosis with no evidence of active Crohn's disease in the small bowel or in the colon. He had a hyperplastic polyp. He has a history of polycythemia followed by Dr.Oodogwu. He receives phlebotomies every 3 months. He had prostate cancer and a multi nodular goiter. He is on 6 MP 25 mg daily. He has remained on a low roughage diet with small frequent feedings. He does not associate his attacks with any specific food.   Past Medical History  Diagnosis Date  . Prostate cancer   . Hyperlipemia   . Hypertension   . Diverticulosis   . Nephrolithiasis   . Gout   . Small bowel obstruction   . Crohn disease   . Polycythemia   . Abnormal LFTs (liver function tests)   . Arthritis   . Hyperplastic colon polyp 2012   Past Surgical History  Procedure Date  . Tonsillectomy   . Ventral hernia repair     2 times  . Lithotripsy   . Exploratory laparotomy w/ bowel resection   . Prostatectomy   . Appendectomy     reports that he quit smoking about 20 years ago. His smokeless tobacco use includes Chew. He reports that he does not drink alcohol or use illicit drugs. family history includes Cancer in his mother; Cervical cancer in his mother; and Heart disease in his father, maternal uncle, and mother.   There is no history of Colon cancer. Allergies  Allergen Reactions  . Colchicine     REACTION: nausea, diarrhea and interactions with 47m        Review of Systems: denies heartburn dysphagia odynophagia or weight loss negative for rectal bleeding  The remainder of the 10 point ROS is negative except as outlined in H&P   Physical Exam: General appearance  Well developed, in no distress. Mildly overweight Eyes- non icteric. HEENT nontraumatic, normocephalic. Mouth no lesions, tongue papillated, no cheilosis. Neck supple without adenopathy, thyroid not enlarged, no carotid bruits, no JVD. Lungs Clear to auscultation bilaterally. Cor normal S1, normal S2, regular rhythm, no murmur,  quiet precordium. Abdomen: Protuberant mildly obese abdomen with normal active bowel sounds. There is no tympany. There are normal bowel sounds. On deep pressure, right lower quadrant is tender but there is no fullness. Rectal: Not done. Extremities no pedal edema. Skin no lesions. Neurological alert and oriented x 3. Psychological normal mood and affect.  Assessment and Plan:  Problem #1 Crohn's disease of the terminal ileum. Patient is status post resection in the past with no current evidence of active Crohn's disease. He is to continue 6-MP as prescribed.  Problem #2 Abdominal pain likely related to adhesions suggesting partial small bowel obstruction. The episodes have been getting closer together and are becoming more severe. We will obtain a CT enterography. I  have spent a lot of time with him today instructing him in a low roughage diet with small frequent feedings. He is naturally reluctant to undergo another surgery. We will obtain a CT enterography to look for a transition point. I will give him Dilaudid 13m, #20 to take as needed for severe abdominal pain.  Problem #3 Polycythemia, followed by Dr.Odogwu.    12/18/2011 Luis Deleon

## 2011-12-18 NOTE — Patient Instructions (Addendum)
You have been given a separate informational sheet regarding your tobacco use, the importance of quitting and local resources to help you quit. We have sent the following medications to your pharmacy for you to pick up at your convenience: Dilaudid 84m  You have been scheduled for a CT enterography at [facility]([address]this is in the same building as [facility].   You are scheduled on 12/21/11 at 11:00 am. You should arrive 1 hour (10 am) prior to your appointment time for registration. Please follow the written instructions below on the day of your exam:  WARNING: IF YOU ARE ALLERGIC TO IODINE/X-RAY DYE, PLEASE NOTIFY RADIOLOGY IMMEDIATELY AT [phone] YOU WILL BE GIVEN A 13 HOUR PREMEDICATION PREP.  1) Do not eat or drink anything after 7:00 am (4 hours prior to your test)  You may take any medications as prescribed with a small amount of water except for the following: Metformin, Glucophage, Glucovance, Avandamet, Riomet, Fortamet, Actoplus Met, Janumet, Glumetza or Metaglip. The above medications must be held the day of the exam AND 48 hours after the exam.  Before your exam is started, you will be given a small amount of fluid to drink. Depending on your individual set of symptoms, you may also receive an intravenous injection of x-ray contrast/dye. [facility], depending on the type of exam you are having performed.  If you have any questions regarding your exam or if you need to reschedule, you may call the CT department at [phone] the hours of 8:00 am and 5:00 pm, Monday-Friday.  ________________________________________________________________________ Your physician has requested that you go to the basement for the following lab work before leaving today: BUN, Creatinine CC: Dr DRedge Gainer

## 2011-12-21 ENCOUNTER — Ambulatory Visit (INDEPENDENT_AMBULATORY_CARE_PROVIDER_SITE_OTHER)
Admission: RE | Admit: 2011-12-21 | Discharge: 2011-12-21 | Disposition: A | Discharge status: Home / Self Care | Payer: Medicare Other | Source: Ambulatory Visit | Attending: Internal Medicine | Admitting: Internal Medicine

## 2011-12-21 DIAGNOSIS — K5090 Crohn's disease, unspecified, without complications: Secondary | ICD-10-CM

## 2011-12-21 DIAGNOSIS — K56609 Unspecified intestinal obstruction, unspecified as to partial versus complete obstruction: Secondary | ICD-10-CM

## 2011-12-21 DIAGNOSIS — K56600 Partial intestinal obstruction, unspecified as to cause: Secondary | ICD-10-CM

## 2011-12-21 MED ORDER — IOHEXOL 300 MG/ML  SOLN
100.0000 mL | Freq: Once | INTRAMUSCULAR | Status: AC | PRN
  Administered 2011-12-21: 100 mL via INTRAVENOUS

## 2012-04-29 ENCOUNTER — Telehealth: Payer: Self-pay | Admitting: Hematology and Oncology

## 2012-04-29 NOTE — Telephone Encounter (Signed)
Moved 7/24 appt to 7/30 w/J[facility] per LO (PAL). lmonvm for pt and mailed schedule.

## 2012-05-07 ENCOUNTER — Other Ambulatory Visit: Payer: Medicare Other | Admitting: Lab

## 2012-05-07 ENCOUNTER — Ambulatory Visit: Payer: Medicare Other | Admitting: Hematology and Oncology

## 2012-05-13 ENCOUNTER — Other Ambulatory Visit (HOSPITAL_BASED_OUTPATIENT_CLINIC_OR_DEPARTMENT_OTHER): Payer: Medicare Other | Admitting: Lab

## 2012-05-13 ENCOUNTER — Ambulatory Visit (HOSPITAL_BASED_OUTPATIENT_CLINIC_OR_DEPARTMENT_OTHER): Payer: Medicare Other | Admitting: Physician Assistant

## 2012-05-13 ENCOUNTER — Ambulatory Visit: Payer: Medicare Other | Admitting: Family

## 2012-05-13 ENCOUNTER — Telehealth: Payer: Self-pay | Admitting: Hematology and Oncology

## 2012-05-13 ENCOUNTER — Encounter: Payer: Self-pay | Admitting: Physician Assistant

## 2012-05-13 ENCOUNTER — Other Ambulatory Visit: Payer: Self-pay | Admitting: Physician Assistant

## 2012-05-13 VITALS — BP 128/78 | HR 78 | Temp 96.7°F | Ht 69.0 in | Wt 231.9 lb

## 2012-05-13 DIAGNOSIS — Z8546 Personal history of malignant neoplasm of prostate: Secondary | ICD-10-CM

## 2012-05-13 DIAGNOSIS — D751 Secondary polycythemia: Secondary | ICD-10-CM

## 2012-05-13 DIAGNOSIS — K5090 Crohn's disease, unspecified, without complications: Secondary | ICD-10-CM

## 2012-05-13 DIAGNOSIS — F17200 Nicotine dependence, unspecified, uncomplicated: Secondary | ICD-10-CM

## 2012-05-13 LAB — CBC WITH DIFFERENTIAL/PLATELET
BASO%: 0.4 % (ref 0.0–2.0)
Eosinophils Absolute: 0.1 10*3/uL (ref 0.0–0.5)
HCT: 48 % (ref 38.4–49.9)
LYMPH%: 16.4 % (ref 14.0–49.0)
MCHC: 34.2 g/dL (ref 32.0–36.0)
MCV: 93.8 fL (ref 79.3–98.0)
MONO#: 0.8 10*3/uL (ref 0.1–0.9)
MONO%: 12.8 % (ref 0.0–14.0)
NEUT%: 69.3 % (ref 39.0–75.0)
Platelets: 168 10*3/uL (ref 140–400)
WBC: 6.5 10*3/uL (ref 4.0–10.3)

## 2012-05-13 LAB — BASIC METABOLIC PANEL
CO2: 27 mEq/L (ref 19–32)
Calcium: 9.5 mg/dL (ref 8.4–10.5)
Creatinine, Ser: 1.46 mg/dL — ABNORMAL HIGH (ref 0.50–1.35)
Glucose, Bld: 82 mg/dL (ref 70–99)

## 2012-05-13 NOTE — Progress Notes (Signed)
No images are attached to the encounter. No scans are attached to the encounter. No scans are attached to the encounter. [facility] OFFICE PROGRESS NOTE  Redge Gainer, MD [place] [id number]  DIAGNOSIS: Polycythemia  PRIOR THERAPY: None  CURRENT THERAPY: 1. Phlebotomy via [facility] 2. Observation  INTERVAL HISTORY: Luis Deleon 71 y.o. male returns for a scheduled regular office visit for followup of his polycythemia. He states that he is feeling fine today. He denies any problems with decreased mentation or headaches. He is a former smoker, smoking a pack of cigarettes daily from approximately age 15 until he was 65 were 71 years old. He no longer smokes but he does chew tobacco 2-3 times a day. He is followed by Dr. [name] for his Crohn's disease which is currently stable. He has been on periodic prednisone tapers for his Crohn's disease but not recently. His biggest issue is that of periodic pain thought to be related to a revised hernia repair with intermittent severe pain and cramping. He was instructed by Dr. Jamse Arn to give blood on a regular basis and he last gave blood to [facility] approximately 6-8 weeks ago.  MEDICAL HISTORY: Past Medical History  Diagnosis Date  . Prostate cancer   . Hyperlipemia   . Hypertension   . Diverticulosis   . Nephrolithiasis   . Gout   . Small bowel obstruction   . Crohn disease   . Polycythemia   . Abnormal LFTs (liver function tests)   . Arthritis   . Hyperplastic colon polyp 2012    ALLERGIES:  is allergic to colchicine.  MEDICATIONS:  Current Outpatient Prescriptions  Medication Sig Dispense Refill  . aspirin 81 MG EC tablet Take 81 mg by mouth daily.        . Cholecalciferol (VITAMIN D3) 1000 UNITS CAPS Take 1 capsule by mouth daily.        Marland Kitchen gemfibrozil (LOPID) 600 MG tablet Take 600 mg by mouth. One every three days       . LIPITOR 40 MG tablet 20 mg  daily.       . mercaptopurine (PURINETHOL) 50 MG tablet Take 1 tablet (50 mg total) by mouth as directed. Give on an empty stomach 1 hour before or 2 hours after meals. Caution: Chemotherapy.  30 tablet  2  . olmesartan-hydrochlorothiazide (BENICAR HCT) 40-12.5 MG per tablet Take 0.5 tablets by mouth daily.        . OMEGA-3 1000 MG CAPS Take 2 capsules by mouth 2 (two) times daily.      . potassium chloride SA (K-DUR,KLOR-CON) 20 MEQ tablet Take 20 mEq by mouth daily.         Current Facility-Administered Medications  Medication Dose Route Frequency Provider Last Rate Last Dose  . 0.9 %  sodium chloride infusion  500 mL Intravenous Continuous Lafayette Dragon, MD        SURGICAL HISTORY:  Past Surgical History  Procedure Date  . Tonsillectomy   . Ventral hernia repair     2 times  . Lithotripsy   . Exploratory laparotomy w/ bowel resection   . Prostatectomy   . Appendectomy     REVIEW OF SYSTEMS:  Pertinent items are noted in HPI.   PHYSICAL EXAMINATION: General appearance: alert, cooperative, appears stated age, no distress and moderately obese Head: Normocephalic, without obvious abnormality, atraumatic Neck: no adenopathy, no carotid bruit, no JVD, supple,  symmetrical, trachea midline and thyroid not enlarged, symmetric, no tenderness/mass/nodules Lymph nodes: Cervical, supraclavicular, and axillary nodes normal. Resp: clear to auscultation bilaterally Cardio: regular rate and rhythm, S1, S2 normal, no murmur, click, rub or gallop GI: soft, non-tender; bowel sounds normal; no masses,  no organomegaly and Obese Extremities: extremities normal, atraumatic, no cyanosis or edema Neurologic: Alert and oriented X 3, normal strength and tone. Normal symmetric reflexes. Normal coordination and gait  ECOG PERFORMANCE STATUS: 0 - Asymptomatic  There were no vitals taken for this visit.  LABORATORY DATA: Lab Results  Component Value Date   WBC 6.5 05/13/2012   HGB 16.4 05/13/2012   HCT  48.0 05/13/2012   MCV 93.8 05/13/2012   PLT 168 05/13/2012      Chemistry      Component Value Date/Time   NA 143 05/13/2012 1104   K 3.8 05/13/2012 1104   CL 107 05/13/2012 1104   CO2 27 05/13/2012 1104   BUN 16 05/13/2012 1104   CREATININE 1.46* 05/13/2012 1104      Component Value Date/Time   CALCIUM 9.5 05/13/2012 1104   ALKPHOS 49 03/07/2011 1132   ALKPHOS 49 03/07/2011 1132   ALKPHOS 49 03/07/2011 1132   ALKPHOS 49 03/07/2011 1132   AST 28 03/07/2011 1132   AST 28 03/07/2011 1132   AST 28 03/07/2011 1132   AST 28 03/07/2011 1132   ALT 28 03/07/2011 1132   ALT 28 03/07/2011 1132   ALT 28 03/07/2011 1132   ALT 28 03/07/2011 1132   BILITOT 1.2 03/07/2011 1132   BILITOT 1.2 03/07/2011 1132   BILITOT 1.2 03/07/2011 1132   BILITOT 1.2 03/07/2011 1132       RADIOGRAPHIC STUDIES:  No results found.   ASSESSMENT/PLAN: The patient is a pleasant 71 year old white male with history of polycythemia. This is being well controlled with periodic voluntary blood donations to the [employer]. Patient was discussed with Dr. Jamse Arn. He'll continue with periodic schedule blood donations to [facility] as advised by Dr. Jamse Arn. He will followup with Dr. Jamse Arn in 9 months on 01/08/2003 team with repeat CBC differential. He was encouraged to discontinue chewing tobacco.    Wynetta Emery, Mozelle Remlinger E, PA-C     All questions were answered. The patient knows to call the clinic with any problems, questions or concerns. We can certainly see the patient much sooner if necessary.  I spent 20 minutes counseling the patient face to face. The total time spent in the appointment was 30 minutes.

## 2012-07-21 ENCOUNTER — Other Ambulatory Visit: Payer: Self-pay | Admitting: Internal Medicine

## 2012-07-21 NOTE — Telephone Encounter (Signed)
-----   Message -----    From: Lafayette Dragon, MD    Sent: 07/21/2012   2:44 PM      To: Larina Bras, CMA  Last CBC 05/13/2012 was normal. Please refill 6MP 50 mg po qd, #30 ----- Message -----    From: Larina Bras, CMA    Sent: 07/21/2012  11:09 AM      To: Lafayette Dragon, MD  Dr Olevia Perches- Mr Takilma requests refills on 89m. However, per our records, he should have been out 03/19/12 if taking 50 mg daily as prescribed. Does he need labs before I send refills or is he okay to continue getting refills without?

## 2012-10-28 ENCOUNTER — Telehealth: Payer: Self-pay | Admitting: Internal Medicine

## 2012-10-29 ENCOUNTER — Telehealth: Payer: Self-pay | Admitting: *Deleted

## 2012-10-29 NOTE — Telephone Encounter (Signed)
Received a voice mail from [place] at Dr. Tawanna Sat office ([place]) [phone]. Patient is having a gout flare. He is on 6 MP for Crohn's. They would like to give him Allopurinol. Allopurinol interacts with 6 MP(increases toxicity) and they want to know if patient can decrease his 6 MP. Please, advise

## 2012-10-29 NOTE — Telephone Encounter (Signed)
Spoke with Dr Laurance Flatten, stop 6MP,

## 2012-10-29 NOTE — Telephone Encounter (Signed)
I have spoken to Dr Laurance Flatten, pt will stop 6MP  So he can start on gout medication ( Allopurinol)

## 2012-10-29 NOTE — Telephone Encounter (Signed)
Received a voice mail from [place] at Dr. Tawanna Sat office ([place]) [phone]. Patient is having a gout flare. He is on 6 MP for Crohn's. They would like to give him Allopurinol. Allopurinol interacts with 6 MP(increases toxicity) and they want to know if patient can decrease his 6 MP. Please, advise

## 2013-01-01 ENCOUNTER — Other Ambulatory Visit (INDEPENDENT_AMBULATORY_CARE_PROVIDER_SITE_OTHER): Payer: Medicare Other

## 2013-01-01 DIAGNOSIS — E785 Hyperlipidemia, unspecified: Secondary | ICD-10-CM

## 2013-01-01 DIAGNOSIS — M109 Gout, unspecified: Secondary | ICD-10-CM

## 2013-01-01 LAB — HEPATIC FUNCTION PANEL
Albumin: 3.7 g/dL (ref 3.5–5.2)
Total Bilirubin: 1.2 mg/dL (ref 0.3–1.2)
Total Protein: 6.2 g/dL (ref 6.0–8.3)

## 2013-01-01 NOTE — Progress Notes (Signed)
Patient came in for labs only.

## 2013-01-07 ENCOUNTER — Ambulatory Visit: Payer: Medicare Other | Admitting: Hematology and Oncology

## 2013-01-07 ENCOUNTER — Other Ambulatory Visit: Payer: Self-pay | Admitting: Family Medicine

## 2013-01-07 ENCOUNTER — Other Ambulatory Visit (HOSPITAL_BASED_OUTPATIENT_CLINIC_OR_DEPARTMENT_OTHER): Payer: Medicare Other | Admitting: Lab

## 2013-01-07 DIAGNOSIS — D751 Secondary polycythemia: Secondary | ICD-10-CM

## 2013-01-07 LAB — CBC WITH DIFFERENTIAL/PLATELET
Eosinophils Absolute: 0.1 10*3/uL (ref 0.0–0.5)
MONO#: 0.8 10*3/uL (ref 0.1–0.9)
MONO%: 11.4 % (ref 0.0–14.0)
NEUT#: 4.7 10*3/uL (ref 1.5–6.5)
RBC: 5.29 10*6/uL (ref 4.20–5.82)
RDW: 14.7 % — ABNORMAL HIGH (ref 11.0–14.6)
WBC: 7 10*3/uL (ref 4.0–10.3)
lymph#: 1.4 10*3/uL (ref 0.9–3.3)
nRBC: 0 % (ref 0–0)

## 2013-01-15 ENCOUNTER — Telehealth: Payer: Self-pay | Admitting: Family Medicine

## 2013-01-15 ENCOUNTER — Telehealth: Payer: Self-pay | Admitting: *Deleted

## 2013-01-15 DIAGNOSIS — M2550 Pain in unspecified joint: Secondary | ICD-10-CM

## 2013-01-15 NOTE — Telephone Encounter (Signed)
Pt called with complaints of redness and swelling in the L great toe. Currently taking Allopurinol. Please call in RX for gout to [facility]

## 2013-01-15 NOTE — Telephone Encounter (Signed)
This patient has not been seen in a good while in this office. At least have him come in get a CBC, BMP, and uric acid Find out what other medications he is taking He really probably should come in for a quick visit

## 2013-01-16 ENCOUNTER — Encounter: Payer: Self-pay | Admitting: Family Medicine

## 2013-01-16 ENCOUNTER — Ambulatory Visit (INDEPENDENT_AMBULATORY_CARE_PROVIDER_SITE_OTHER): Payer: Medicare Other | Admitting: Family Medicine

## 2013-01-16 VITALS — BP 82/54 | HR 107 | Temp 97.8°F | Ht 68.0 in | Wt 238.0 lb

## 2013-01-16 DIAGNOSIS — M109 Gout, unspecified: Secondary | ICD-10-CM

## 2013-01-16 DIAGNOSIS — M2550 Pain in unspecified joint: Secondary | ICD-10-CM

## 2013-01-16 DIAGNOSIS — K5090 Crohn's disease, unspecified, without complications: Secondary | ICD-10-CM

## 2013-01-16 DIAGNOSIS — I10 Essential (primary) hypertension: Secondary | ICD-10-CM

## 2013-01-16 LAB — POCT CBC
HCT, POC: 46.7 % (ref 43.5–53.7)
Lymph, poc: 1.2 (ref 0.6–3.4)
MCHC: 33.6 g/dL (ref 31.8–35.4)
POC Granulocyte: 8.6 — AB (ref 2–6.9)
POC LYMPH PERCENT: 12.5 %L (ref 10–50)
RDW, POC: 15.2 %
WBC: 9.9 10*3/uL (ref 4.6–10.2)

## 2013-01-16 LAB — BASIC METABOLIC PANEL WITH GFR
CO2: 27 mEq/L (ref 19–32)
Calcium: 8.9 mg/dL (ref 8.4–10.5)
Creat: 1.51 mg/dL — ABNORMAL HIGH (ref 0.50–1.35)

## 2013-01-16 LAB — URIC ACID: Uric Acid, Serum: 5.9 mg/dL (ref 4.0–6.0)

## 2013-01-16 NOTE — Patient Instructions (Signed)
Elevate foot Take Tylenol for aches pains and fever Take prescribed medications as directed Patient was forewarned that Colcrys could cause diarrhea He is to call back in the morning with some additional blood pressure readings

## 2013-01-16 NOTE — Telephone Encounter (Signed)
Pt will come in for OV

## 2013-01-16 NOTE — Telephone Encounter (Signed)
Canceled due to reorder in OV

## 2013-01-16 NOTE — Progress Notes (Signed)
  Subjective:    Patient ID: Luis Deleon, male    DOB: 1941-03-13, 72 y.o.   MRN: [id number]  HPI Luis Deleon comes in today with an episode of acute gout going on for 48 hours. This is in the left dorsal foot and great toe   Review of Systems  Constitutional: Positive for fever and chills.  Respiratory: Negative for shortness of breath and wheezing.   Cardiovascular: Negative for chest pain.  Gastrointestinal: Positive for diarrhea. Negative for abdominal pain.       He has a history of Crohn's disease with intermittent diarrhea. This has been stable.  Musculoskeletal: Positive for myalgias, joint swelling, arthralgias and gait problem.       Since the onset of gout he has had myalgias, chills, and swollen left foot over the great toe.  Skin: Positive for color change.       Skin color over the great toe is red and inflamed  Neurological: Negative for dizziness.       Objective:   Physical Exam  Vitals reviewed. Constitutional: He is oriented to person, place, and time. He appears well-developed and well-nourished.  HENT:  Head: Normocephalic and atraumatic.  Eyes: Conjunctivae are normal.  Neck: Normal range of motion. Neck supple.  Cardiovascular: Regular rhythm.   Musculoskeletal: He exhibits edema and tenderness.  He is limping because of the pain and swelling in his left foot. Has edema and tenderness over his great toe.  Neurological: He is alert and oriented to person, place, and time.  Skin: Skin is warm and dry. There is erythema (left great toe).  Psychiatric: He has a normal mood and affect. His behavior is normal. Thought content normal.          Assessment & Plan:    Acute gout attack History of Crohn's disease hypotension  CBC, uric acid, and BMP were drawn He was started on prednisone 30m taper #20,                                colcrys .623m# 30, 2 stat then 1 more pill in 1 hour, then 1 daily Continue allopurinol 100 mg,  Two daily Elevate foot as  much as possible and take Tylenol for pain

## 2013-01-22 ENCOUNTER — Other Ambulatory Visit (INDEPENDENT_AMBULATORY_CARE_PROVIDER_SITE_OTHER): Payer: Medicare Other

## 2013-01-22 DIAGNOSIS — E876 Hypokalemia: Secondary | ICD-10-CM

## 2013-01-22 DIAGNOSIS — M109 Gout, unspecified: Secondary | ICD-10-CM

## 2013-01-22 LAB — BASIC METABOLIC PANEL
BUN: 20 mg/dL (ref 6–23)
Calcium: 9.4 mg/dL (ref 8.4–10.5)
Glucose, Bld: 79 mg/dL (ref 70–99)
Sodium: 141 mEq/L (ref 135–145)

## 2013-01-22 LAB — URIC ACID: Uric Acid, Serum: 5 mg/dL (ref 4.0–6.0)

## 2013-01-22 NOTE — Progress Notes (Unsigned)
Labs only

## 2013-01-28 ENCOUNTER — Encounter: Payer: Self-pay | Admitting: Internal Medicine

## 2013-01-28 ENCOUNTER — Ambulatory Visit (HOSPITAL_BASED_OUTPATIENT_CLINIC_OR_DEPARTMENT_OTHER): Payer: Medicare Other | Admitting: Internal Medicine

## 2013-01-28 VITALS — BP 138/90 | HR 86 | Temp 97.2°F | Resp 18 | Ht 68.0 in | Wt 238.1 lb

## 2013-01-28 DIAGNOSIS — D751 Secondary polycythemia: Secondary | ICD-10-CM

## 2013-01-28 NOTE — Patient Instructions (Signed)
Hemoglobin and hematocrit are normal today. Follow up with your primary care physician as previously scheduled. Followup with me on as-needed basis.

## 2013-01-28 NOTE — Progress Notes (Signed)
[place] [phone]   [phone]  OFFICE PROGRESS NOTE  Redge Gainer, MD [place] [address]  DIAGNOSIS: Polycythemia   PRIOR THERAPY: None   CURRENT THERAPY:  1. Phlebotomy via scheduled blood donations [facility] 2. Observation  INTERVAL HISTORY: HAYZEN LORENSON 72 y.o. male returns to the clinic today for followup visit. He is a former patient Dr. Jamse Arn who is  seeing me today to establish care after she left the practice. 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 He is feeling fine today with no specific complaints. He denied having any significant headache or bleeding vision. He denied having any neurological abnormalities. Has no weight loss or night sweats. He has no chest pain, shortness breath, cough or hemoptysis. CBC performed recently showed normal hemoglobin and hematocrit.  MEDICAL HISTORY: Past Medical History  Diagnosis Date  . Prostate cancer   . Hyperlipemia   . Hypertension   . Diverticulosis   . Nephrolithiasis   . Gout   . Small bowel obstruction   . Crohn disease   . Polycythemia   . Abnormal LFTs (liver function tests)   . Arthritis   . Hyperplastic colon polyp 2012    ALLERGIES:  is allergic to colchicine.  MEDICATIONS:  Current Outpatient Prescriptions  Medication Sig Dispense Refill  . allopurinol (ZYLOPRIM) 100 MG tablet 200 mg daily.      Marland Kitchen amLODipine (NORVASC) 5 MG tablet 10 mg daily.      Marland Kitchen aspirin 81 MG EC tablet Take 81 mg by mouth daily.        Marland Kitchen atorvastatin (LIPITOR) 40 MG tablet TAKE ONE TABLET BY MOUTH EVERY DAY AS DIRECTED  30 tablet  1  . Cholecalciferol (VITAMIN D3) 1000 UNITS CAPS Take 1 capsule by mouth daily.        Marland Kitchen gemfibrozil (LOPID) 600 MG tablet Take 600 mg by mouth. One every three days       . olmesartan-hydrochlorothiazide (BENICAR HCT) 40-12.5 MG  per tablet Take 0.5 tablets by mouth daily.        . OMEGA-3 1000 MG CAPS Take 2 capsules by mouth 2 (two) times daily.      . potassium chloride SA (K-DUR,KLOR-CON) 20 MEQ tablet Take 20 mEq by mouth daily.        Marland Kitchen COLCRYS 0.6 MG tablet 0.6 mg.       Current Facility-Administered Medications  Medication Dose Route Frequency Provider Last Rate Last Dose  . 0.9 %  sodium chloride infusion  500 mL Intravenous Continuous Lafayette Dragon, MD        SURGICAL HISTORY:  Past Surgical History  Procedure Laterality Date  . Tonsillectomy    . Ventral hernia repair      2 times  . Lithotripsy    . Exploratory laparotomy w/ bowel resection    . Prostatectomy    . Appendectomy      REVIEW OF SYSTEMS:  A comprehensive review of systems was negative.   PHYSICAL EXAMINATION: General appearance: alert, cooperative and no distress Head: Normocephalic, without obvious abnormality, atraumatic Neck: no adenopathy Lymph nodes: Cervical, supraclavicular, and axillary nodes normal. Resp: clear to auscultation bilaterally Cardio: regular rate and rhythm, S1, S2 normal, no murmur, click, rub or gallop GI: soft, non-tender; bowel sounds normal; no masses,  no organomegaly Extremities: extremities normal,  atraumatic, no cyanosis or edema  ECOG PERFORMANCE STATUS: 0 - Asymptomatic  Blood pressure 138/90, pulse 86, temperature 97.2 F (36.2 C), temperature source Oral, resp. rate 18, height 5' 8"  (1.727 m), weight 238 lb 1.6 oz (108.001 kg).  LABORATORY DATA: Lab Results  Component Value Date   WBC 9.9 01/16/2013   HGB 15.7 01/16/2013   HCT 46.7 01/16/2013   MCV 93.1 01/16/2013   PLT 160 01/07/2013      Chemistry      Component Value Date/Time   NA 141 01/22/2013 1237   K 4.2 01/22/2013 1237   CL 105 01/22/2013 1237   CO2 29 01/22/2013 1237   BUN 20 01/22/2013 1237   CREATININE 1.19 01/22/2013 1237   CREATININE 1.46* 05/13/2012 1104      Component Value Date/Time   CALCIUM 9.4 01/22/2013 1237   ALKPHOS  62 01/01/2013 0937   AST 24 01/01/2013 0937   ALT 21 01/01/2013 0937   BILITOT 1.2 01/01/2013 0937       RADIOGRAPHIC STUDIES: No results found.  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   PLAN: I discussed the lab result with the patient today. I recommended for him to continue on observation with routine followup visit with his primary care physician. I would see the patient as-needed basis at this point.  All questions were answered. The patient knows to call the clinic with any problems, questions or concerns. We can certainly see the patient much sooner if necessary.

## 2013-03-02 ENCOUNTER — Other Ambulatory Visit: Payer: Self-pay | Admitting: Family Medicine

## 2013-03-17 ENCOUNTER — Other Ambulatory Visit: Payer: Self-pay | Admitting: Family Medicine

## 2013-03-31 NOTE — Telephone Encounter (Signed)
Done on 01/22/2013.

## 2013-04-08 ENCOUNTER — Ambulatory Visit (INDEPENDENT_AMBULATORY_CARE_PROVIDER_SITE_OTHER): Payer: Medicare Other | Admitting: Family Medicine

## 2013-04-08 ENCOUNTER — Encounter: Payer: Self-pay | Admitting: Family Medicine

## 2013-04-08 VITALS — BP 123/78 | HR 91 | Temp 97.6°F | Ht 68.0 in | Wt 240.0 lb

## 2013-04-08 DIAGNOSIS — E785 Hyperlipidemia, unspecified: Secondary | ICD-10-CM

## 2013-04-08 DIAGNOSIS — D751 Secondary polycythemia: Secondary | ICD-10-CM

## 2013-04-08 DIAGNOSIS — Z8546 Personal history of malignant neoplasm of prostate: Secondary | ICD-10-CM

## 2013-04-08 DIAGNOSIS — M109 Gout, unspecified: Secondary | ICD-10-CM

## 2013-04-08 DIAGNOSIS — H6123 Impacted cerumen, bilateral: Secondary | ICD-10-CM

## 2013-04-08 DIAGNOSIS — I10 Essential (primary) hypertension: Secondary | ICD-10-CM

## 2013-04-08 DIAGNOSIS — E559 Vitamin D deficiency, unspecified: Secondary | ICD-10-CM

## 2013-04-08 DIAGNOSIS — H6120 Impacted cerumen, unspecified ear: Secondary | ICD-10-CM

## 2013-04-08 DIAGNOSIS — K5090 Crohn's disease, unspecified, without complications: Secondary | ICD-10-CM

## 2013-04-08 LAB — BASIC METABOLIC PANEL WITH GFR
Calcium: 9.3 mg/dL (ref 8.4–10.5)
GFR, Est African American: 55 mL/min — ABNORMAL LOW
GFR, Est Non African American: 48 mL/min — ABNORMAL LOW
Potassium: 4.1 mEq/L (ref 3.5–5.3)
Sodium: 139 mEq/L (ref 135–145)

## 2013-04-08 LAB — HEPATIC FUNCTION PANEL
ALT: 18 U/L (ref 0–53)
Bilirubin, Direct: <0.1 mg/dL (ref 0.0–0.3)
Total Bilirubin: 0.7 mg/dL (ref 0.3–1.2)

## 2013-04-08 LAB — POCT CBC
HCT, POC: 46 % (ref 43.5–53.7)
Hemoglobin: 16.3 g/dL (ref 14.1–18.1)
MPV: 9.3 fL (ref 0–99.8)
POC Granulocyte: 4.6 (ref 2–6.9)
POC LYMPH PERCENT: 24.6 %L (ref 10–50)
RBC: 5 M/uL (ref 4.69–6.13)

## 2013-04-08 LAB — URIC ACID: Uric Acid, Serum: 5.1 mg/dL (ref 4.0–6.0)

## 2013-04-08 MED ORDER — COLCHICINE 0.6 MG PO TABS: 0.6000 mg | ORAL_TABLET | Freq: Every day | ORAL | Status: DC

## 2013-04-08 NOTE — Patient Instructions (Signed)
Continue current medications. Continue aggressive therapeutic lifestyle changes May consider using Imodium when taking colcrys Depending upon uric acid level may consider increasing allopurinol to 300 mg daily Because of persistent ear cerumen consider periodic use of debrox to keep your wax softer

## 2013-04-08 NOTE — Addendum Note (Signed)
Addended by: Angelina Ok on: 04/08/2013 08:57 AM   Modules accepted: Orders

## 2013-04-08 NOTE — Progress Notes (Signed)
  Subjective:    Patient ID: Luis Deleon, male    DOB: May 28, 1941, 72 y.o.   MRN: [id number]  HPI Patient returns to clinic for followup of chronic medical problems. These include hypertension hyperlipidemia gout and polycythemia. He has a history of prostate cancer and has had a radical prostatectomy. He sees Dr. Elicia Lamp for his Crohn's disease. Also see the review of systems. Health maintenance is up-to-date except for the shingles shot . We usually do his annual prostate/rectal exam. He no longer sees the hematologist for his polycythemia.   Review of Systems  Constitutional: Negative.   HENT: Negative.   Eyes: Positive for pain (burning late in the afternoon).  Respiratory: Negative.   Cardiovascular: Negative.   Gastrointestinal: Positive for diarrhea (intermitent).  Endocrine: Negative.   Genitourinary: Negative.   Musculoskeletal: Positive for arthralgias (L thumb).  Skin: Negative.   Allergic/Immunologic: Negative.   Neurological: Negative.   Hematological: Negative.   Psychiatric/Behavioral: Negative.        Objective:   Physical Exam  BP 123/78  Pulse 91  Temp(Src) 97.6 F (36.4 C) (Oral)  Ht 5' 8"  (1.727 m)  Wt 240 lb (108.863 kg)  BMI 36.5 kg/m2  The patient appeared well nourished and normally developed, slightly overweight. He was alert and oriented to time and place. Speech, behavior and judgement appear normal. Vital signs as documented.  Head exam is unremarkable. No scleral icterus or pallor noted. There is some nasal congestion bilaterally. He has bilateral ear cerumen. This obscures the eardrums from being visualized appear Neck is without jugular venous distension, thyromegally, or carotid bruits. Carotid upstrokes are brisk bilaterally. No cervical adenopathy. Lungs are clear anteriorly and posteriorly to auscultation. Normal respiratory effort. Cardiac exam reveals regular rate and rhythm at 84 per minute. First and second heart sounds normal.  No  murmurs, rubs or gallops.  Abdominal exam reveals obesity, normal bowl sounds, no masses, no organomegaly and no aortic enlargement. No inguinal adenopathy. There was generalized tenderness but no different from the past. Extremities are nonedematous and both femoral and pedal pulses are normal. Skin without pallor or jaundice.  Warm and more dry than usual, without rash. Neurologic exam reveals normal deep tendon reflexes and normal sensation.          Assessment & Plan:  1. CROHN'S DISEASE  2. HYPERLIPIDEMIA - NMR Lipoprofile with Lipids; Standing - Hepatic function panel; Standing  3. HYPERTENSION - BASIC METABOLIC PANEL WITH GFR; Standing  4. PROSTATE CANCER, PERSONAL HX -We will recheck this in November  5. POLYCYTHEMIA - POCT CBC; Standing  6. Gout, unspecified -Refill: Colcrys - Uric acid -May need to increase allopurinol based on uric acid level  7. Vitamin D deficiency disease - Vitamin D 25 hydroxy; Standing  Patient Instructions  Continue current medications. Continue aggressive therapeutic lifestyle changes May consider using Imodium when taking colcrys Depending upon uric acid level may consider increasing allopurinol to 300 mg daily Because of persistent ear cerumen consider periodic use of debrox to keep your wax softer

## 2013-04-09 LAB — VITAMIN D 25 HYDROXY (VIT D DEFICIENCY, FRACTURES): Vit D, 25-Hydroxy: 42 ng/mL (ref 30–89)

## 2013-04-09 LAB — NMR LIPOPROFILE WITH LIPIDS
Cholesterol, Total: 140 mg/dL (ref <200)
HDL Size: 8.4 nm — ABNORMAL LOW (ref >=9.2)
HDL-C: 33 mg/dL — ABNORMAL LOW (ref >=40)
LDL Particle Number: 1236 nmol/L — ABNORMAL HIGH (ref <1000)
LP-IR Score: 71 — ABNORMAL HIGH (ref <=45)
Triglycerides: 202 mg/dL — ABNORMAL HIGH (ref <150)
VLDL Size: 45.1 nm (ref <=46.6)

## 2013-04-10 ENCOUNTER — Telehealth: Payer: Self-pay | Admitting: *Deleted

## 2013-04-10 NOTE — Telephone Encounter (Signed)
Message copied by Marin Olp on Fri Apr 10, 2013 12:47 PM ------      Message from: Chipper Herb      Created: Thu Apr 09, 2013  1:57 PM       Uric acid one day ago was excellent at 5.1.      The. total LDL particle number was elevated at 1236. Triglycerides are elevated at 202. HDL particle number is low at 27.2.      Vitamin D is good at 54.      Electrolytes were good. Blood sugar was good at 87. Creatinine slightly elevated at 1.47. It has been in this range in the past. We will continue to monitor the creatinine in future blood draws.      All reported liver function tests were within normal limits.            Please confirm current cholesterol medications. Question atorvastatin, question gemfibrozil, question fish oil, also the doses of each of these medications??? ------

## 2013-04-10 NOTE — Telephone Encounter (Signed)
Pt's wife notified of results Pt has cut down on fish oil due to cost, currently only taking 2 daily instead of 4 a day as in the past

## 2013-05-15 ENCOUNTER — Other Ambulatory Visit: Payer: Self-pay | Admitting: Family Medicine

## 2013-05-18 ENCOUNTER — Other Ambulatory Visit: Payer: Self-pay | Admitting: Family Medicine

## 2013-06-29 ENCOUNTER — Other Ambulatory Visit: Payer: Self-pay | Admitting: Family Medicine

## 2013-07-23 ENCOUNTER — Ambulatory Visit: Payer: Medicare Other

## 2013-08-04 ENCOUNTER — Ambulatory Visit (INDEPENDENT_AMBULATORY_CARE_PROVIDER_SITE_OTHER): Payer: Medicare Other

## 2013-08-04 ENCOUNTER — Encounter: Payer: Self-pay | Admitting: Family Medicine

## 2013-08-04 ENCOUNTER — Ambulatory Visit (INDEPENDENT_AMBULATORY_CARE_PROVIDER_SITE_OTHER): Payer: Medicare Other | Admitting: Family Medicine

## 2013-08-04 ENCOUNTER — Encounter (INDEPENDENT_AMBULATORY_CARE_PROVIDER_SITE_OTHER): Payer: Self-pay

## 2013-08-04 VITALS — BP 137/76 | HR 89 | Temp 97.7°F | Ht 68.0 in | Wt 238.0 lb

## 2013-08-04 DIAGNOSIS — R059 Cough, unspecified: Secondary | ICD-10-CM

## 2013-08-04 DIAGNOSIS — R0609 Other forms of dyspnea: Secondary | ICD-10-CM

## 2013-08-04 DIAGNOSIS — R0600 Dyspnea, unspecified: Secondary | ICD-10-CM

## 2013-08-04 DIAGNOSIS — Z23 Encounter for immunization: Secondary | ICD-10-CM

## 2013-08-04 DIAGNOSIS — R0989 Other specified symptoms and signs involving the circulatory and respiratory systems: Secondary | ICD-10-CM

## 2013-08-04 DIAGNOSIS — R05 Cough: Secondary | ICD-10-CM

## 2013-08-04 DIAGNOSIS — R0602 Shortness of breath: Secondary | ICD-10-CM

## 2013-08-04 DIAGNOSIS — D751 Secondary polycythemia: Secondary | ICD-10-CM

## 2013-08-04 DIAGNOSIS — I10 Essential (primary) hypertension: Secondary | ICD-10-CM

## 2013-08-04 LAB — POCT CBC
Hemoglobin: 16.1 g/dL (ref 14.1–18.1)
Lymph, poc: 1.7 (ref 0.6–3.4)
MCH, POC: 30.1 pg (ref 27–31.2)
MCHC: 33 g/dL (ref 31.8–35.4)
MCV: 91.1 fL (ref 80–97)
MPV: 8.9 fL (ref 0–99.8)
POC Granulocyte: 5.4 (ref 2–6.9)
Platelet Count, POC: 189 10*3/uL (ref 142–424)
RBC: 5.4 M/uL (ref 4.69–6.13)
WBC: 7.4 10*3/uL (ref 4.6–10.2)

## 2013-08-04 NOTE — Progress Notes (Signed)
Subjective:    Patient ID: Luis Deleon, male    DOB: 1941-09-22, 72 y.o.   MRN: [id number]  HPI Patient here today due to shortness of breath for 2 weeks, worse with exertion. Patient comes in today with his daughter. Patient typically has slept in a recliner at night time so this is nothing new for him even though these problems of shortness of breath had been noticed for just the past couple of weeks. He has had a recent chest x-ray but this was not done during the time he has had more trouble with his breathing. He denies any chest pain or discomfort. He describes his symptoms of shortness of breath occurring every day. The night spells are more sporadic. His daughter who is with him today says he has excessive snoring. He describes these shortness of breath episodes as just hard to take a deep breath. He indicates when he has the episodes at nighttime that he feels like he wakes up and he is drowning.    Patient Active Problem List   Diagnosis Date Noted  . 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 POLYCYTHEMIA 11/03/2008  . LIVER FUNCTION TESTS, ABNORMAL, HX OF 09/24/2008  . HYPERLIPIDEMIA 07/28/2008  . GOUT, UNSPECIFIED 07/28/2008  . HYPERTENSION 07/28/2008  . CROHN'S DISEASE 07/28/2008  . DIVERTICULOSIS, COLON 07/28/2008  . PROSTATE CANCER, PERSONAL HX 07/28/2008  . SMALL BOWEL OBSTRUCTION, HX OF 07/28/2008  . NEPHROLITHIASIS, HX OF 07/28/2008   Outpatient Encounter Prescriptions as of 08/04/2013  Medication Sig Dispense Refill  . allopurinol (ZYLOPRIM) 100 MG tablet 200 mg daily.      Marland Kitchen amLODipine (NORVASC) 5 MG tablet Take 5 mg by mouth daily.       Marland Kitchen aspirin 81 MG EC tablet Take 81 mg by mouth daily.        Marland Kitchen atorvastatin (LIPITOR) 40 MG tablet TAKE 1 TABLET EVERY DAY AS DIRECTED  30 tablet  2  . BENICAR HCT 40-12.5 MG per tablet TAKE 1 TABLET BY MOUTH ONCE A DAY  30 tablet  4  . Cholecalciferol (VITAMIN D3) 1000 UNITS CAPS Take 1 capsule by mouth daily.        .  OMEGA-3 1000 MG CAPS Take 2 capsules by mouth 2 (two) times daily.      . potassium chloride SA (K-DUR,KLOR-CON) 20 MEQ tablet One po qd  30 tablet  2  . [DISCONTINUED] gemfibrozil (LOPID) 600 MG tablet TAKE 1 TABLET BY MOUTH EVERY DAY AS DIRECTED  30 tablet  4  . colchicine (COLCRYS) 0.6 MG tablet Take 1 tablet (0.6 mg total) by mouth daily.  30 tablet  11  . [DISCONTINUED] 0.9 %  sodium chloride infusion        No facility-administered encounter medications on file as of 08/04/2013.    Review of Systems  Constitutional: Negative.  Negative for fatigue.  HENT: Negative.   Eyes: Negative.   Respiratory: Positive for cough (little) and shortness of breath (worse with excertion / x 2 weeks).   Cardiovascular: Negative.   Gastrointestinal: Negative.   Endocrine: Negative.   Genitourinary: Negative.   Musculoskeletal: Negative.   Skin: Negative.   Allergic/Immunologic: Negative.   Neurological: Negative.  Negative for dizziness and headaches.  Hematological: Negative.   Psychiatric/Behavioral: Negative.        Objective:   Physical Exam  Nursing note and vitals reviewed. Constitutional: He is oriented to person, place, and time. He appears well-developed and well-nourished. No distress.  HENT:  Head: Normocephalic and  atraumatic.  Right Ear: External ear normal.  Left Ear: External ear normal.  Nose: Nose normal.  Mouth/Throat: Oropharynx is clear and moist. No oropharyngeal exudate.  There is some nasal congestion bilaterally.  Eyes: Conjunctivae and EOM are normal. Right eye exhibits no discharge. Left eye exhibits no discharge. No scleral icterus.  Neck: Normal range of motion. Neck supple. No thyromegaly present.  Cardiovascular: Normal rate, regular rhythm, normal heart sounds and intact distal pulses.  Exam reveals no gallop and no friction rub.   No murmur heard. At 84 per minute  Pulmonary/Chest: Effort normal and breath sounds normal. No respiratory distress. He has no  wheezes. He has no rales. He exhibits no tenderness.  Abdominal: Soft. Bowel sounds are normal. He exhibits no mass. There is no tenderness. There is no rebound and no guarding.  Musculoskeletal: Normal range of motion. He exhibits no edema and no tenderness.  Lymphadenopathy:    He has no cervical adenopathy.  Neurological: He is alert and oriented to person, place, and time. No cranial nerve deficit.  Skin: Skin is warm and dry. No rash noted. He is not diaphoretic.  Psychiatric: He has a normal mood and affect. His behavior is normal. Judgment and thought content normal.   BP 137/76  Pulse 89  Temp(Src) 97.7 F (36.5 C) (Oral)  Ht 5' 8"  (1.727 m)  Wt 238 lb (107.956 kg)  BMI 36.2 kg/m2  SpO2 98%  Pulse ox 98% [facility] reading (PRIMARY) by  Dr. Laurance Flatten: Chest x-ray    --- the chest x-ray was unchanged from the previous exam and within normal                                    Assessment & Plan:   1. SOB (shortness of breath)   2. Need for prophylactic vaccination and inoculation against influenza   3. Cough   4. POLYCYTHEMIA   5. HYPERTENSION   6. DOE (dyspnea on exertion)    Orders Placed This Encounter  Procedures  . DG Chest 2 View    Standing Status: Future     Number of Occurrences: 1     Standing Expiration Date: 10/04/2014    Order Specific Question:  Reason for Exam (SYMPTOM  OR DIAGNOSIS REQUIRED)    Answer:  cough, sob    Order Specific Question:  Preferred imaging location?    Answer:  Internal  . D-dimer, quantitative  . BMP8+EGFR  . Ambulatory referral to Pulmonology    Referral Priority:  Urgent    Referral Type:  Consultation    Referral Reason:  Specialty Services Required    Requested Specialty:  Pulmonary Disease    Number of Visits Requested:  1  . POCT CBC  . EKG 12-Lead    We will also schedule a 24-hour Holter monitor and do a referral to the cardiologist for further evaluation with possibly a stress test and or an echo  Patient Instructions   Shortness of Breath Shortness of breath means you have trouble breathing. Shortness of breath may indicate that you have a medical problem. You should seek immediate medical care for shortness of breath. CAUSES   Not enough oxygen in the air (as with high altitudes or a smoke-filled room).  Short-term (acute) lung disease, including:  Infections, such as pneumonia.  Fluid in the lungs, such as heart failure.  A blood clot in the lungs (pulmonary embolism).  Long-term (chronic) lung diseases.  Heart disease (heart attack, angina, heart failure, and others).  Low red blood cells (anemia).  Poor physical fitness. This can cause shortness of breath when you exercise.  Chest or back injuries or stiffness.  Being overweight.  Smoking.  Anxiety. This can make you feel like you are not getting enough air. DIAGNOSIS  Serious medical problems can usually be found during your physical exam. Tests may also be done to determine why you are having shortness of breath. Tests may include:  Chest X-rays.  Lung function tests.  Blood tests.  Electrocardiography.  Exercise testing.  Echocardiography.  Imaging scans. Your caregiver may not be able to find a cause for your shortness of breath after your exam. In this case, it is important to have a follow-up exam with your caregiver as directed.  TREATMENT  Treatment for shortness of breath depends on the cause of your symptoms and can vary greatly. HOME CARE INSTRUCTIONS   Do not smoke. Smoking is a common cause of shortness of breath. If you smoke, ask for help to quit.  Avoid being around chemicals or things that may bother your breathing, such as paint fumes and dust.  Rest as needed. Slowly resume your usual activities.  If medicines were prescribed, take them as directed for the full length of time directed. This includes oxygen and any inhaled medicines.  Keep all follow-up appointments as directed by your  caregiver. SEEK MEDICAL CARE IF:   Your condition does not improve in the time expected.  You have a hard time doing your normal activities even with rest.  You have any side effects or problems with the medicines prescribed.  You develop any new symptoms. SEEK IMMEDIATE MEDICAL CARE IF:   Your shortness of breath gets worse.  You feel lightheaded, faint, or develop a cough not controlled with medicines.  You start coughing up blood.  You have pain with breathing.  You have chest pain or pain in your arms, shoulders, or abdomen.  You have a fever.  You are unable to walk up stairs or exercise the way you normally do. MAKE SURE YOU:  Understand these instructions.  Will watch your condition.  Will get help right away if you are not doing well or get worse. Document Released: 06/26/2001 Document Revised: 04/01/2012 Document Reviewed: 12/17/2011 [facility] Patient Information 2014 [place].   We will make arrangements for you to see the pulmonologist and be evaluated for possible sleep apnea Also do a referral to the cardiologist where he may want to do a stress test and a echocardiogram. Prior to the visit with the cardiologist we will do a 24-hour Holter monitor Results of the lab is done today In a couple weeks     Arrie Senate MD

## 2013-08-04 NOTE — Patient Instructions (Addendum)
Shortness of Breath Shortness of breath means you have trouble breathing. Shortness of breath may indicate that you have a medical problem. You should seek immediate medical care for shortness of breath. CAUSES   Not enough oxygen in the air (as with high altitudes or a smoke-filled room).  Short-term (acute) lung disease, including:  Infections, such as pneumonia.  Fluid in the lungs, such as heart failure.  A blood clot in the lungs (pulmonary embolism).  Long-term (chronic) lung diseases.  Heart disease (heart attack, angina, heart failure, and others).  Low red blood cells (anemia).  Poor physical fitness. This can cause shortness of breath when you exercise.  Chest or back injuries or stiffness.  Being overweight.  Smoking.  Anxiety. This can make you feel like you are not getting enough air. DIAGNOSIS  Serious medical problems can usually be found during your physical exam. Tests may also be done to determine why you are having shortness of breath. Tests may include:  Chest X-rays.  Lung function tests.  Blood tests.  Electrocardiography.  Exercise testing.  Echocardiography.  Imaging scans. Your caregiver may not be able to find a cause for your shortness of breath after your exam. In this case, it is important to have a follow-up exam with your caregiver as directed.  TREATMENT  Treatment for shortness of breath depends on the cause of your symptoms and can vary greatly. HOME CARE INSTRUCTIONS   Do not smoke. Smoking is a common cause of shortness of breath. If you smoke, ask for help to quit.  Avoid being around chemicals or things that may bother your breathing, such as paint fumes and dust.  Rest as needed. Slowly resume your usual activities.  If medicines were prescribed, take them as directed for the full length of time directed. This includes oxygen and any inhaled medicines.  Keep all follow-up appointments as directed by your caregiver. SEEK  MEDICAL CARE IF:   Your condition does not improve in the time expected.  You have a hard time doing your normal activities even with rest.  You have any side effects or problems with the medicines prescribed.  You develop any new symptoms. SEEK IMMEDIATE MEDICAL CARE IF:   Your shortness of breath gets worse.  You feel lightheaded, faint, or develop a cough not controlled with medicines.  You start coughing up blood.  You have pain with breathing.  You have chest pain or pain in your arms, shoulders, or abdomen.  You have a fever.  You are unable to walk up stairs or exercise the way you normally do. MAKE SURE YOU:  Understand these instructions.  Will watch your condition.  Will get help right away if you are not doing well or get worse. Document Released: 06/26/2001 Document Revised: 04/01/2012 Document Reviewed: 12/17/2011 [facility] Patient Information 2014 [place].   We will make arrangements for you to see the pulmonologist and be evaluated for possible sleep apnea Also do a referral to the cardiologist where he may want to do a stress test and a echocardiogram. Prior to the visit with the cardiologist we will do a 24-hour Holter monitor Results of the lab is done today In a couple weeks

## 2013-08-04 NOTE — Addendum Note (Signed)
Addended by: Zannie Cove on: 08/04/2013 12:55 PM   Modules accepted: Orders

## 2013-08-04 NOTE — Progress Notes (Signed)
24 hour holter monitor applied to pt today at office visit. Placed at 1:45 today- be be removed tomorrow at 1:45 pm.

## 2013-08-05 LAB — BMP8+EGFR
BUN/Creatinine Ratio: 9 — ABNORMAL LOW (ref 10–22)
Calcium: 9.3 mg/dL (ref 8.6–10.2)
Creatinine, Ser: 1.26 mg/dL (ref 0.76–1.27)
GFR calc non Af Amer: 58 mL/min/{1.73_m2} — ABNORMAL LOW (ref >59)
Potassium: 4.2 mmol/L (ref 3.5–5.2)
Sodium: 144 mmol/L (ref 134–144)

## 2013-08-05 LAB — D-DIMER, QUANTITATIVE: D-Dimer: 0.39 mg/L FEU (ref 0.00–0.49)

## 2013-08-07 ENCOUNTER — Telehealth: Payer: Self-pay | Admitting: Family Medicine

## 2013-08-10 ENCOUNTER — Ambulatory Visit: Payer: Medicare Other

## 2013-08-11 ENCOUNTER — Encounter: Payer: Self-pay | Admitting: Cardiovascular Disease

## 2013-08-11 ENCOUNTER — Encounter: Payer: Self-pay | Admitting: *Deleted

## 2013-08-11 ENCOUNTER — Other Ambulatory Visit: Payer: Self-pay | Admitting: *Deleted

## 2013-08-11 ENCOUNTER — Ambulatory Visit (INDEPENDENT_AMBULATORY_CARE_PROVIDER_SITE_OTHER): Payer: Medicare Other | Admitting: Cardiovascular Disease

## 2013-08-11 VITALS — BP 110/69 | HR 89 | Ht 69.0 in | Wt 239.0 lb

## 2013-08-11 DIAGNOSIS — R0602 Shortness of breath: Secondary | ICD-10-CM

## 2013-08-11 DIAGNOSIS — R0609 Other forms of dyspnea: Secondary | ICD-10-CM | POA: Insufficient documentation

## 2013-08-11 DIAGNOSIS — R0600 Dyspnea, unspecified: Secondary | ICD-10-CM | POA: Insufficient documentation

## 2013-08-11 DIAGNOSIS — I10 Essential (primary) hypertension: Secondary | ICD-10-CM

## 2013-08-11 DIAGNOSIS — R0989 Other specified symptoms and signs involving the circulatory and respiratory systems: Secondary | ICD-10-CM

## 2013-08-11 DIAGNOSIS — E785 Hyperlipidemia, unspecified: Secondary | ICD-10-CM

## 2013-08-11 DIAGNOSIS — R5383 Other fatigue: Secondary | ICD-10-CM

## 2013-08-11 DIAGNOSIS — R5381 Other malaise: Secondary | ICD-10-CM

## 2013-08-11 LAB — BRAIN NATRIURETIC PEPTIDE: Brain Natriuretic Peptide: <2 pg/mL (ref 0.0–100.0)

## 2013-08-11 NOTE — Patient Instructions (Addendum)
Your physician recommends that you schedule a follow-up appointment in: [place] has requested that you have an echocardiogram. Echocardiography is a painless test that uses sound waves to create images of your heart. It provides your doctor with information about the size and shape of your heart and how well your heart's chambers and valves are working. This procedure takes approximately one hour. There are no restrictions for this procedure.  Your physician has requested that you have en exercise stress myoview. For further information please visit [email or web address]. Please follow instruction sheet, as given.  Your physician recommends that you return for lab work in: TODAY (Secretary D-DIMER, BNP)  WE WILL CALL YOU WITH YOUR TEST RESULTS/INSTRUCTIONS/NEXT STEPS ONCE RECEIVED BY THE PROVIDER  PLEASE BE ADVISED YOU WILL STILL NEED TO KEEP YOUR FOLLOW UP APPOINTMENT TO DISCUSS FURTHER DETAILS OF YOUR TEST RESULTS/NEXT STEPS/FUTURE PLAN OF CARE WITH YOUR PROVIDER DESPITE THE FACT THAT YOU MAY HAVE NORMAL TEST RESULTS

## 2013-08-11 NOTE — Telephone Encounter (Signed)
Pt called yesterday

## 2013-08-11 NOTE — Progress Notes (Signed)
Patient ID: Luis Deleon, male   DOB: [birth year], 72 y.o.   MRN: [id number]       CARDIOLOGY CONSULT NOTE  Patient ID: Luis Deleon MRN: [id number] DOB/AGE: 08-May-1941 72 y.o.  Admit date: (Not on file) Primary Physician Redge Gainer, MD  Reason for Consultation: dyspnea on exertion  HPI: [name] is a 72 yr old male with HTN, hyperlipidemia, polycythemia, and recent onset of dyspnea on exertion. A chest xray was normal and an ECG on 10/21 showed normal sinus rhythm. He has noticed for the past 2 weeks, he's had difficulty taking a deep breath. He denies associated palpitations, chest discomfort, lightheadedness, leg swelling, orthopnea, and PND per se. He does say that once every 6 months, he has a dream where he feels like he can't breathe and then he wakes up "gasping for air". However, this is exceedingly infrequent. He denies a fever, chills, and cough. He does a lot of yardwork and has had no limitations whatsoever. He did notice that about 2 weeks ago, he was walking uphill and felt quite fatigued and was short of breath.  2 yrs ago, he would walk 1 mile daily on a treadmill but has stopped due to "laziness", not symptoms.  SocHx: used to smoke 1 ppd from age 36 until 70 years ago.    Allergies  Allergen Reactions  . Colchicine     REACTION: nausea, diarrhea and interactions with 4m    Current Outpatient Prescriptions  Medication Sig Dispense Refill  . allopurinol (ZYLOPRIM) 100 MG tablet 200 mg daily.      .Marland KitchenamLODipine (NORVASC) 5 MG tablet Take 10 mg by mouth daily.       .Marland Kitchenaspirin 81 MG EC tablet Take 81 mg by mouth daily.        .Marland Kitchenatorvastatin (LIPITOR) 40 MG tablet TAKE 1 TABLET EVERY DAY AS DIRECTED  30 tablet  2  . Cholecalciferol (VITAMIN D3) 1000 UNITS CAPS Take 1 capsule by mouth daily.        . colchicine (COLCRYS) 0.6 MG tablet Take 1 tablet (0.6 mg total) by mouth daily.  30 tablet  11  . gemfibrozil (LOPID) 600 MG tablet Take 600 mg by mouth 3  (three) times a week.       . olmesartan-hydrochlorothiazide (BENICAR HCT) 40-12.5 MG per tablet TAKE HALF TABLET BY MOUTH ONCE A DAY      . OMEGA-3 1000 MG CAPS Take 2 capsules by mouth 2 (two) times daily.      . potassium chloride SA (K-DUR,KLOR-CON) 20 MEQ tablet One po qd  30 tablet  2   No current facility-administered medications for this visit.    Past Medical History  Diagnosis Date  . Prostate cancer   . Hyperlipemia   . Hypertension   . Diverticulosis   . Nephrolithiasis   . Gout   . Small bowel obstruction   . Crohn disease   . Polycythemia   . Abnormal LFTs (liver function tests)   . Arthritis   . Hyperplastic colon polyp 2012    Past Surgical History  Procedure Laterality Date  . Tonsillectomy    . Ventral hernia repair      2 times  . Lithotripsy    . Exploratory laparotomy w/ bowel resection    . Prostatectomy    . Appendectomy      History   Social History  . Marital Status: Married    Spouse Name: N/A    Number of  Children: N/A  . Years of Education: N/A   Occupational History  . Not on file.   Social History Main Topics  . Smoking status: Former Smoker -- 1.00 packs/day for 38 years    Quit date: 03/19/1991  . Smokeless tobacco: Current User    Types: Chew  . Alcohol Use: No  . Drug Use: No  . Sexual Activity: Not on file   Other Topics Concern  . Not on file   Social History Narrative  . No narrative on file     Family History  Problem Relation Age of Onset  . Heart disease Mother   . Cervical cancer Mother   . Cancer Mother     cervical  . Heart disease Father   . Heart disease Maternal Uncle   . Colon cancer Neg Hx      Prior to Admission medications   Medication Sig Start Date End Date Taking? Authorizing Provider  allopurinol (ZYLOPRIM) 100 MG tablet 200 mg daily. 01/22/13  Yes Historical Provider, MD  amLODipine (NORVASC) 5 MG tablet Take 10 mg by mouth daily.  01/22/13  Yes Historical Provider, MD  aspirin 81 MG EC  tablet Take 81 mg by mouth daily.     Yes Historical Provider, MD  atorvastatin (LIPITOR) 40 MG tablet TAKE 1 TABLET EVERY DAY AS DIRECTED 05/18/13  Yes Chipper Herb, MD  Cholecalciferol (VITAMIN D3) 1000 UNITS CAPS Take 1 capsule by mouth daily.     Yes Historical Provider, MD  colchicine (COLCRYS) 0.6 MG tablet Take 1 tablet (0.6 mg total) by mouth daily. 04/08/13  Yes Chipper Herb, MD  gemfibrozil (LOPID) 600 MG tablet Take 600 mg by mouth 3 (three) times a week.  07/20/13  Yes Historical Provider, MD  olmesartan-hydrochlorothiazide (BENICAR HCT) 40-12.5 MG per tablet TAKE HALF TABLET BY MOUTH ONCE A DAY 03/02/13  Yes Chipper Herb, MD  OMEGA-3 1000 MG CAPS Take 2 capsules by mouth 2 (two) times daily.   Yes Historical Provider, MD  potassium chloride SA (K-DUR,KLOR-CON) 20 MEQ tablet One po qd 06/29/13  Yes Chipper Herb, MD     Review of systems complete and found to be negative unless listed above in HPI     Physical exam Blood pressure 110/69, pulse 89, height 5' 9"  (1.753 m), weight 239 lb (108.41 kg). General: NAD, obese Neck: No JVD, no thyromegaly or thyroid nodule.  Lungs: Clear to auscultation bilaterally with normal respiratory effort. CV: Nondisplaced PMI.  Heart regular S1/S2, no S3/S4, no murmur.  No peripheral edema.  No carotid bruit.  Normal pedal pulses.  Abdomen: Soft, nontender, no hepatosplenomegaly, no distention.  Skin: Intact without lesions or rashes.  Neurologic: Alert and oriented x 3.  Psych: Normal affect. Extremities: No clubbing or cyanosis.  HEENT: Normal.   Labs:   Lab Results  Component Value Date   WBC 7.4 08/04/2013   HGB 16.1 08/04/2013   HCT 48.9 08/04/2013   MCV 91.1 08/04/2013   PLT 160 01/07/2013    Recent Labs Lab 08/04/13 1227  NA 144  K 4.2  CL 103  CO2 26  BUN 11  CREATININE 1.26  CALCIUM 9.3  GLUCOSE 81   No results found for this basename: CKTOTAL, CKMB, CKMBINDEX, TROPONINI    No results found for this basename:  CHOL   No results found for this basename: HDL   Lab Results  Component Value Date   LDLCALC 67 04/08/2013   Lab Results  Component  Value Date   TRIG 202* 04/08/2013   No results found for this basename: CHOLHDL   No results found for this basename: LDLDIRECT       EKG: See HPI   ASSESSMENT AND PLAN: 1. Dyspnea on exertion and fatigue: other than the one episode of walking uphill and experiencing fatigue and SOB, his other symptoms are atypical for both heart failure and CAD. His risk factors for occult ischemic heart disease are HTN and hyperlipidemia. I will obtain an echocardiogram to evaluate for structural heart disease, as well as a treadmill Cardiolite stress test to evaluate for inducible ischemia. I will additionally obtain a BNP and a d-dimer.  Dispo: f/u 1 month.  Signed: Kate Sable, M.D., F.A.C.C.  08/11/2013, 8:28 AM

## 2013-08-12 ENCOUNTER — Encounter: Payer: Self-pay | Admitting: *Deleted

## 2013-08-18 ENCOUNTER — Telehealth: Payer: Self-pay | Admitting: *Deleted

## 2013-08-18 NOTE — Telephone Encounter (Signed)
Pt is not able to have stress test or 2-d echo done. He has canceled them along with f/u appt for Dr Bronson Ing. Preauthorization was got for testing and his out of pocket expense was going to be over $700 he can not afford this.

## 2013-08-18 NOTE — Telephone Encounter (Signed)
FYI

## 2013-08-20 ENCOUNTER — Encounter (HOSPITAL_COMMUNITY)
Admission: RE | Admit: 2013-08-20 | Discharge: 2013-08-20 | Disposition: A | Discharge status: Home / Self Care | Payer: Medicare Other | Source: Ambulatory Visit | Attending: Cardiovascular Disease | Admitting: Cardiovascular Disease

## 2013-08-20 ENCOUNTER — Encounter (HOSPITAL_COMMUNITY): Payer: Self-pay

## 2013-08-20 ENCOUNTER — Encounter (HOSPITAL_COMMUNITY): Payer: Medicare Other

## 2013-08-20 ENCOUNTER — Ambulatory Visit (HOSPITAL_COMMUNITY)
Admission: RE | Admit: 2013-08-20 | Discharge: 2013-08-20 | Disposition: A | Discharge status: Home / Self Care | Payer: Medicare Other | Source: Ambulatory Visit | Attending: Cardiovascular Disease | Admitting: Cardiovascular Disease

## 2013-08-20 ENCOUNTER — Inpatient Hospital Stay (HOSPITAL_COMMUNITY): Admission: RE | Admit: 2013-08-20 | Payer: Medicare Other | Source: Ambulatory Visit

## 2013-08-20 DIAGNOSIS — I10 Essential (primary) hypertension: Secondary | ICD-10-CM

## 2013-08-20 DIAGNOSIS — R0600 Dyspnea, unspecified: Secondary | ICD-10-CM

## 2013-08-20 DIAGNOSIS — R0609 Other forms of dyspnea: Secondary | ICD-10-CM | POA: Insufficient documentation

## 2013-08-20 DIAGNOSIS — R0602 Shortness of breath: Secondary | ICD-10-CM

## 2013-08-20 DIAGNOSIS — R0989 Other specified symptoms and signs involving the circulatory and respiratory systems: Secondary | ICD-10-CM | POA: Insufficient documentation

## 2013-08-20 DIAGNOSIS — R5381 Other malaise: Secondary | ICD-10-CM | POA: Insufficient documentation

## 2013-08-20 DIAGNOSIS — I319 Disease of pericardium, unspecified: Secondary | ICD-10-CM

## 2013-08-20 DIAGNOSIS — E785 Hyperlipidemia, unspecified: Secondary | ICD-10-CM | POA: Insufficient documentation

## 2013-08-20 DIAGNOSIS — Z87891 Personal history of nicotine dependence: Secondary | ICD-10-CM | POA: Insufficient documentation

## 2013-08-20 DIAGNOSIS — D45 Polycythemia vera: Secondary | ICD-10-CM | POA: Insufficient documentation

## 2013-08-20 MED ORDER — REGADENOSON 0.4 MG/5ML IV SOLN
INTRAVENOUS | Status: AC
  Filled 2013-08-20: qty 5

## 2013-08-20 MED ORDER — TECHNETIUM TC 99M SESTAMIBI - CARDIOLITE
10.0000 | Freq: Once | INTRAVENOUS | Status: AC | PRN
  Administered 2013-08-20: 10 via INTRAVENOUS

## 2013-08-20 MED ORDER — SODIUM CHLORIDE 0.9 % IJ SOLN
INTRAMUSCULAR | Status: AC
  Administered 2013-08-20: 10 mL via INTRAVENOUS
  Filled 2013-08-20: qty 10

## 2013-08-20 MED ORDER — TECHNETIUM TC 99M SESTAMIBI GENERIC - CARDIOLITE
30.0000 | Freq: Once | INTRAVENOUS | Status: AC | PRN
  Administered 2013-08-20: 30 via INTRAVENOUS

## 2013-08-20 NOTE — Progress Notes (Signed)
Stress Lab Nurses Notes - [facility] 08/20/2013 Reason for doing test: DOE Type of test: Stress Cardiolite Nurse performing test: Gerrit Halls, RN Nuclear Medicine Tech: Dyanne Carrel Echo Tech: Not Applicable MD performing test: Koneswaran/K.Lawrence NP Family MD: Laurance Flatten Test explained and consent signed: yes IV started: 22g jelco, Saline lock flushed, No redness or edema and Saline lock started in radiology Symptoms: SOB & Fatigue Treatment/Intervention: None Reason test stopped: fatigue and reached target HR After recovery IV was: Discontinued via X-ray tech and No redness or edema Patient to return to [facility]. Med at : 11:00 Patient discharged: Home Patient's Condition upon discharge was: stable Comments: During test peak BP 166/106 & HR 142 .  Recovery BP 96/59 & HR 99.  Symptoms resolved in recovery. Geanie Cooley T

## 2013-08-20 NOTE — Progress Notes (Signed)
*  PRELIMINARY RESULTS* Echocardiogram 2D Echocardiogram has been performed.  [place], [place] 08/20/2013, 9:20 AM

## 2013-08-21 ENCOUNTER — Telehealth: Payer: Self-pay | Admitting: *Deleted

## 2013-08-21 NOTE — Telephone Encounter (Signed)
Pt left message with the front desk to advise someone from our office left him a vm yesterday but he could not make out who the message was left by, this nurse contacted the pt after chart review to find the pt echo was normal and HD had left him a message to advise, PT WIFE ADVISED RESULTS AND WILL INFORM PT

## 2013-08-24 ENCOUNTER — Other Ambulatory Visit: Payer: Self-pay | Admitting: Family Medicine

## 2013-08-31 ENCOUNTER — Encounter: Payer: Self-pay | Admitting: Cardiovascular Disease

## 2013-08-31 ENCOUNTER — Ambulatory Visit (INDEPENDENT_AMBULATORY_CARE_PROVIDER_SITE_OTHER): Payer: Medicare Other | Admitting: Cardiovascular Disease

## 2013-08-31 VITALS — BP 157/84 | HR 95 | Ht 69.0 in | Wt 240.0 lb

## 2013-08-31 DIAGNOSIS — R5383 Other fatigue: Secondary | ICD-10-CM

## 2013-08-31 DIAGNOSIS — R0600 Dyspnea, unspecified: Secondary | ICD-10-CM

## 2013-08-31 DIAGNOSIS — R0989 Other specified symptoms and signs involving the circulatory and respiratory systems: Secondary | ICD-10-CM

## 2013-08-31 DIAGNOSIS — E785 Hyperlipidemia, unspecified: Secondary | ICD-10-CM

## 2013-08-31 DIAGNOSIS — I10 Essential (primary) hypertension: Secondary | ICD-10-CM

## 2013-08-31 DIAGNOSIS — IMO0001 Reserved for inherently not codable concepts without codable children: Secondary | ICD-10-CM

## 2013-08-31 DIAGNOSIS — R0602 Shortness of breath: Secondary | ICD-10-CM

## 2013-08-31 DIAGNOSIS — R0609 Other forms of dyspnea: Secondary | ICD-10-CM

## 2013-08-31 DIAGNOSIS — R5381 Other malaise: Secondary | ICD-10-CM

## 2013-08-31 DIAGNOSIS — Z136 Encounter for screening for cardiovascular disorders: Secondary | ICD-10-CM

## 2013-08-31 NOTE — Progress Notes (Signed)
Patient ID: Luis Deleon, male   DOB: 1941/01/30, 72 y.o.   MRN: [id number]      SUBJECTIVE: The patient is here to followup on the results of cardiovascular testing. He had a d-dimer and a BNP which were both normal. A nuclear cardiac stress test was normal as well, with no evidence of ischemia or scar. The calculated LVEF was 67%. An echocardiogram revealed normal left ventricular systolic function with an ejection fraction of 60-65%. Wall motion was normal. There was grade 1 diastolic dysfunction and a small posterior pericardial effusion. He's had no further recurrences of dyspnea. He also denies chest pain. He mulches his leaves with a push mower on a very routine basis without any limitations whatsoever. He lost his way getting here and got stuck in traffic, and blames his blood pressure elevation today on this.    Allergies  Allergen Reactions  . Colchicine     REACTION: nausea, diarrhea and interactions with 35m    Current Outpatient Prescriptions  Medication Sig Dispense Refill  . allopurinol (ZYLOPRIM) 100 MG tablet 200 mg daily.      .Marland KitchenamLODipine (NORVASC) 5 MG tablet Take 10 mg by mouth daily.       .Marland Kitchenaspirin 81 MG EC tablet Take 81 mg by mouth daily.        .Marland Kitchenatorvastatin (LIPITOR) 40 MG tablet TAKE 1 TABLET EVERY DAY AS DIRECTED  30 tablet  4  . Cholecalciferol (VITAMIN D3) 1000 UNITS CAPS Take 1 capsule by mouth daily.        . colchicine (COLCRYS) 0.6 MG tablet Take 1 tablet (0.6 mg total) by mouth daily.  30 tablet  11  . gemfibrozil (LOPID) 600 MG tablet Take 600 mg by mouth 3 (three) times a week.       . olmesartan-hydrochlorothiazide (BENICAR HCT) 40-12.5 MG per tablet TAKE HALF TABLET BY MOUTH ONCE A DAY      . OMEGA-3 1000 MG CAPS Take 2 capsules by mouth 2 (two) times daily.      . potassium chloride SA (K-DUR,KLOR-CON) 20 MEQ tablet One po qd  30 tablet  2   No current facility-administered medications for this visit.    Past Medical History  Diagnosis  Date  . Prostate cancer   . Hyperlipemia   . Hypertension   . Diverticulosis   . Nephrolithiasis   . Gout   . Small bowel obstruction   . Crohn disease   . Polycythemia   . Abnormal LFTs (liver function tests)   . Arthritis   . Hyperplastic colon polyp 2012    Past Surgical History  Procedure Laterality Date  . Tonsillectomy    . Ventral hernia repair      2 times  . Lithotripsy    . Exploratory laparotomy w/ bowel resection    . Prostatectomy    . Appendectomy      History   Social History  . Marital Status: Married    Spouse Name: N/A    Number of Children: N/A  . Years of Education: N/A   Occupational History  . Not on file.   Social History Main Topics  . Smoking status: Former Smoker -- 1.00 packs/day for 38 years    Quit date: 03/19/1991  . Smokeless tobacco: Current User    Types: Chew  . Alcohol Use: No  . Drug Use: No  . Sexual Activity: Not on file   Other Topics Concern  . Not on file  Social History Narrative  . No narrative on file     Filed Vitals:   08/31/13 0832  BP: 157/84  Pulse: 95  Height: 5' 9"  (1.753 m)  Weight: 240 lb (108.863 kg)    PHYSICAL EXAM General: NAD Neck: No JVD, no thyromegaly or thyroid nodule.  Lungs: Clear to auscultation bilaterally with normal respiratory effort. CV: Nondisplaced PMI.  Heart regular S1/S2, no S3/S4, no murmur.  No peripheral edema.  No carotid bruit.  Normal pedal pulses.  Abdomen: Soft, nontender, no hepatosplenomegaly, no distention.  Neurologic: Alert and oriented x 3.  Psych: Normal affect. Extremities: No clubbing or cyanosis.   ECG: reviewed and available in electronic records.   - Left ventricle: Mild to moderate concentric left ventricular hypertrophy. Systolic function was normal. The estimated ejection fraction was in the range of 60% to 65%. Wall motion was normal; there were no regional wall motion abnormalities. Doppler parameters are consistent with abnormal left  ventricular relaxation (grade 1 diastolic dysfunction). - Aortic valve: Trileaflet; mildly thickened, mildly calcified leaflets. There was no stenosis. - Mitral valve: Calcified annulus. - Pericardium, extracardiac: A small posterior pericardial effusion was identified. No evidence of hemodynamic compromise.    ASSESSMENT AND PLAN: 1. Dyspnea on exertion and fatigue: other than the one episode of walking uphill and experiencing fatigue and SOB, his other symptoms are atypical for both heart failure and CAD. His risk factors for occult ischemic heart disease are HTN and hyperlipidemia. His stress test was normal and his echocardiogram showed normal left ventricle systolic function and no regional wall motion abnormalities. I would recommend that he proceed with pulmonary function testing to exclude a pulmonary etiology. He did tell me that he is being scheduled for a sleep study as well. He has a very low likelihood of having ischemic heart disease based on the aforementioned testing. If he were to have a recurrence of symptoms, I have asked him to contact me to see if any further cardiovascular testing is indicated.   Kate Sable, M.D., F.A.C.C.

## 2013-08-31 NOTE — Patient Instructions (Signed)
Your physician wants you to follow-up as needed

## 2013-09-02 ENCOUNTER — Ambulatory Visit (INDEPENDENT_AMBULATORY_CARE_PROVIDER_SITE_OTHER): Payer: Medicare Other | Admitting: Family Medicine

## 2013-09-02 ENCOUNTER — Other Ambulatory Visit: Payer: Self-pay | Admitting: Family Medicine

## 2013-09-02 VITALS — BP 129/69 | HR 95 | Temp 98.7°F | Ht 69.0 in | Wt 237.8 lb

## 2013-09-02 DIAGNOSIS — E559 Vitamin D deficiency, unspecified: Secondary | ICD-10-CM

## 2013-09-02 DIAGNOSIS — Z23 Encounter for immunization: Secondary | ICD-10-CM

## 2013-09-02 DIAGNOSIS — I10 Essential (primary) hypertension: Secondary | ICD-10-CM

## 2013-09-02 DIAGNOSIS — R0600 Dyspnea, unspecified: Secondary | ICD-10-CM

## 2013-09-02 DIAGNOSIS — R0609 Other forms of dyspnea: Secondary | ICD-10-CM

## 2013-09-02 DIAGNOSIS — E785 Hyperlipidemia, unspecified: Secondary | ICD-10-CM

## 2013-09-02 DIAGNOSIS — K5090 Crohn's disease, unspecified, without complications: Secondary | ICD-10-CM

## 2013-09-02 DIAGNOSIS — M109 Gout, unspecified: Secondary | ICD-10-CM

## 2013-09-02 DIAGNOSIS — R0989 Other specified symptoms and signs involving the circulatory and respiratory systems: Secondary | ICD-10-CM

## 2013-09-02 DIAGNOSIS — D751 Secondary polycythemia: Secondary | ICD-10-CM

## 2013-09-02 DIAGNOSIS — Z8546 Personal history of malignant neoplasm of prostate: Secondary | ICD-10-CM

## 2013-09-02 LAB — POCT CBC
Granulocyte percent: 64.2 %G (ref 37–80)
HCT, POC: 49.5 % (ref 43.5–53.7)
Hemoglobin: 16.1 g/dL (ref 14.1–18.1)
MCH, POC: 29.8 pg (ref 27–31.2)
MCHC: 32.6 g/dL (ref 31.8–35.4)
POC LYMPH PERCENT: 29.2 %L (ref 10–50)
Platelet Count, POC: 152 10*3/uL (ref 142–424)
RDW, POC: 15.5 %
WBC: 6.6 10*3/uL (ref 4.6–10.2)

## 2013-09-02 LAB — BASIC METABOLIC PANEL WITH GFR
CO2: 27 mEq/L (ref 19–32)
Creat: 1.56 mg/dL — ABNORMAL HIGH (ref 0.50–1.35)
Glucose, Bld: 89 mg/dL (ref 70–99)
Potassium: 3.6 mEq/L (ref 3.5–5.3)
Sodium: 140 mEq/L (ref 135–145)

## 2013-09-02 LAB — HEPATIC FUNCTION PANEL
ALT: 24 U/L (ref 0–53)
AST: 28 U/L (ref 0–37)
Bilirubin, Direct: 0.2 mg/dL (ref 0.0–0.3)
Indirect Bilirubin: 1 mg/dL — ABNORMAL HIGH (ref 0.0–0.9)

## 2013-09-03 ENCOUNTER — Encounter: Payer: Self-pay | Admitting: Family Medicine

## 2013-09-03 LAB — NMR LIPOPROFILE WITH LIPIDS
Cholesterol, Total: 128 mg/dL (ref <200)
LDL Particle Number: 1122 nmol/L — ABNORMAL HIGH (ref <1000)
Large HDL-P: <1.3 umol/L — ABNORMAL LOW (ref >=4.8)
Large VLDL-P: 4.2 nmol/L — ABNORMAL HIGH (ref <=2.7)
Triglycerides: 205 mg/dL — ABNORMAL HIGH (ref <150)

## 2013-09-03 NOTE — Patient Instructions (Signed)
Continue current medication Keep followup visit with the pulmonologist, Always be careful and not putting herself at risk for falling We will let you know when the lab results or back We will make sure that Dr. Jeffie Pollock gets a copy of all of this information You will get your Prevnar shot today

## 2013-09-03 NOTE — Progress Notes (Signed)
Subjective:    Patient ID: Luis Deleon, male    DOB: Jul 04, 1941, 72 y.o.   MRN: [id number]  HPI The patient comes in today for followup from his episode of shortness of breath. First episode lasted for 4-5 days and fortunately now has resolved. He did see the cardiologist and was evaluated by him and was told that everything was okay. In this evaluation he had a Holter monitor, echocardiogram, and a stress test. All of these were good. He has continued to monitor his home blood pressures and they have been good He does have a plan to see the pulmonologist. He is doing well with his Crohn's disease. He also comes in to have a rectal exam and PSA for which these results will be sent to Dr. Jeffie Pollock.   Review of Systems Review of systems. He has had no fever fatigue or weakness. He's had no visual problems head congestion ear pain or sore throat. He shortness of breath is better. He denies coughing wheezing or sputum production. He denies chest pain or palpitations. With his GI system he has no complaints and the diarrhea seems to be under good control. He denies anxiety. He denies trouble passing his water. He did have a prostatectomy in the past the cause of prostate cancer and he wears a pad because of incontinence. He has had no dizziness or numbness or tingling. He denies any kind of skin rash.    Objective:   Physical Exam  Nursing note and vitals reviewed. Constitutional: He is oriented to person, place, and time. He appears well-developed and well-nourished. No distress.  HENT:  Head: Normocephalic and atraumatic.  Right Ear: External ear normal.  Left Ear: External ear normal.  Nose: Nose normal.  Mouth/Throat: Oropharynx is clear and moist. No oropharyngeal exudate.  Eyes: Conjunctivae and EOM are normal. Pupils are equal, round, and reactive to light. Right eye exhibits no discharge. Left eye exhibits no discharge. No scleral icterus.  Neck: Normal range of motion. Neck supple. No  tracheal deviation present. No thyromegaly present.  Cardiovascular: Normal rate, regular rhythm, normal heart sounds and intact distal pulses.  Exam reveals no gallop and no friction rub.   No murmur heard. At 84 per minute  Pulmonary/Chest: Effort normal and breath sounds normal. No respiratory distress. He has no wheezes. He has no rales. He exhibits no tenderness.  Abdominal: Soft. Bowel sounds are normal. He exhibits no mass. There is no tenderness. There is no rebound and no guarding.  Genitourinary: Rectum normal and penis normal.  Rectal exam the prostate vault was empty and there were no signs of any lumps or masses per rectum. There were no rectal masses. The testicles were normal without any hernia. And there were no inguinal nodes.  Musculoskeletal: Normal range of motion. He exhibits no edema and no tenderness.  Lymphadenopathy:    He has no cervical adenopathy.  Neurological: He is alert and oriented to person, place, and time. He has normal reflexes. No cranial nerve deficit.  Skin: Skin is warm and dry. No rash noted. No erythema. No pallor.  Psychiatric: He has a normal mood and affect. His behavior is normal. Judgment and thought content normal.          Assessment & Plan:   1. HYPERTENSION   2. POLYCYTHEMIA   3. HYPERLIPIDEMIA   4. Vitamin D deficiency disease   5. Gout, unspecified   6. DOE (dyspnea on exertion)   7. CROHN'S DISEASE   8. History  of prostate cancer    Orders Placed This Encounter  Procedures  . Thyroid Panel With TSH  . PSA, total and free   In addition it looks like he also had a CBC advanced lipid testing liver function test BMP and vitamin D as well as a PSA  Patient Instructions  Continue current medication Keep followup visit with the pulmonologist, Always be careful and not putting herself at risk for falling We will let you know when the lab results or back We will make sure that Dr. Jeffie Pollock gets a copy of all of this  information You will get your Prevnar shot today   Arrie Senate MD

## 2013-09-04 LAB — THYROID PANEL WITH TSH
Free Thyroxine Index: 3.3 (ref 1.0–3.9)
T3 Uptake: 35.9 % (ref 22.5–37.0)
T4, Total: 9.1 ug/dL (ref 5.0–12.5)
TSH: 2.535 u[IU]/mL (ref 0.350–4.500)

## 2013-09-04 LAB — PSA, TOTAL AND FREE

## 2013-09-05 LAB — PSA, TOTAL AND FREE

## 2013-09-07 ENCOUNTER — Ambulatory Visit: Payer: Medicare Other | Admitting: Cardiovascular Disease

## 2013-09-09 ENCOUNTER — Telehealth: Payer: Self-pay | Admitting: Family Medicine

## 2013-09-09 NOTE — Telephone Encounter (Signed)
Patient aware.

## 2013-09-21 ENCOUNTER — Telehealth (INDEPENDENT_AMBULATORY_CARE_PROVIDER_SITE_OTHER): Payer: Self-pay | Admitting: *Deleted

## 2013-09-21 ENCOUNTER — Other Ambulatory Visit: Payer: Self-pay | Admitting: Family Medicine

## 2013-09-21 ENCOUNTER — Other Ambulatory Visit (INDEPENDENT_AMBULATORY_CARE_PROVIDER_SITE_OTHER): Payer: Self-pay

## 2013-09-21 DIAGNOSIS — E042 Nontoxic multinodular goiter: Secondary | ICD-10-CM

## 2013-09-21 NOTE — Telephone Encounter (Signed)
LMOM for pt to return my call.  I was calling to notify him of his appt for his follow up thyroid US on 12/9 with an arrival time of 1:30pm at [id number].

## 2013-09-21 NOTE — Telephone Encounter (Signed)
Pt called back and I gave him appt information below.

## 2013-09-22 ENCOUNTER — Other Ambulatory Visit: Payer: Medicare Other

## 2013-09-22 ENCOUNTER — Ambulatory Visit
Admission: RE | Admit: 2013-09-22 | Discharge: 2013-09-22 | Disposition: A | Discharge status: Home / Self Care | Payer: Medicare Other | Source: Ambulatory Visit | Attending: General Surgery | Admitting: General Surgery

## 2013-09-22 DIAGNOSIS — E042 Nontoxic multinodular goiter: Secondary | ICD-10-CM

## 2013-09-24 ENCOUNTER — Encounter (INDEPENDENT_AMBULATORY_CARE_PROVIDER_SITE_OTHER): Payer: Self-pay

## 2013-09-24 ENCOUNTER — Ambulatory Visit (INDEPENDENT_AMBULATORY_CARE_PROVIDER_SITE_OTHER): Payer: Medicare Other | Admitting: General Surgery

## 2013-09-24 ENCOUNTER — Encounter (INDEPENDENT_AMBULATORY_CARE_PROVIDER_SITE_OTHER): Payer: Self-pay | Admitting: General Surgery

## 2013-09-24 VITALS — BP 126/78 | HR 78 | Temp 97.0°F | Resp 12 | Ht 69.0 in | Wt 237.5 lb

## 2013-09-24 DIAGNOSIS — E042 Nontoxic multinodular goiter: Secondary | ICD-10-CM

## 2013-09-24 NOTE — Patient Instructions (Signed)
Examination of your thyroid gland today is unchanged. There is no significant enlargement and no abnormal lymph nodes.  Your recent ultrasound shows that your thyroid nodules on the right and the  left remain unchanged. This is very reassuring.  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 We have biopsied this and followed  this for 3 years. This is a very low risk finding. Nothing further needs to be done.  Return to see Dr. [name] as needed.

## 2013-09-24 NOTE — Progress Notes (Signed)
Patient ID: Luis Deleon, male   DOB: 03-24-1941, 72 y.o.   MRN: [id number]  History: This gentleman returns for long-term followup regarding multinodular goiter. He was initially evaluated in 2011 for a nodule on the right and the nodule the left. Fine needle aspiration cytology revealed a benign nonneoplastic goiter bilaterally, no atypia. I last saw him in 2012 at which time his ultrasound was stable. He had a recent ultrasound which is also stable. The nodule the right is 1.9 cm in maximum dimension. The nodule on the left is 3.4 cm in dimension. He's had recent thyroid function tests which are normal. He's had no change in his voice or swallowing.  Past history, family history, social history, and review of systems are documented on the chart, unchanged and noncontributory except as noted above  Exam: Patient is very pleasant. Friendly. No distress. Neck reveals no adenopathy anteriorly or posteriorly. Thyroid gland may be slightly enlarged but no discrete nodules detected. Lungs clear to auscultation bilaterally Heart regular rate and rhythm. No ectopy. No tachycardia. No murmur  Assessment: Benign, nontoxic, multinodular goiter. This has remained stable and no further active followup is necessary at other than annual physical exam  Plan: Continue annual thyroid exam as part of his annual physical with Dr. Laurance Flatten Return to see me as needed.   Luis Deleon. Luis Deleon, M.D., [facility] General and Minimally invasive Surgery Breast and Colorectal Surgery Office:   [phone] Pager:   [phone]

## 2013-09-25 ENCOUNTER — Ambulatory Visit: Payer: Medicare Other | Admitting: Cardiovascular Disease

## 2013-09-28 ENCOUNTER — Ambulatory Visit (INDEPENDENT_AMBULATORY_CARE_PROVIDER_SITE_OTHER): Payer: Medicare Other | Admitting: Nurse Practitioner

## 2013-09-28 ENCOUNTER — Institutional Professional Consult (permissible substitution): Payer: Medicare Other | Admitting: Internal Medicine

## 2013-09-28 VITALS — BP 142/81 | HR 105 | Temp 99.0°F | Ht 69.0 in | Wt 235.0 lb

## 2013-09-28 DIAGNOSIS — J019 Acute sinusitis, unspecified: Secondary | ICD-10-CM

## 2013-09-28 DIAGNOSIS — J209 Acute bronchitis, unspecified: Secondary | ICD-10-CM

## 2013-09-28 MED ORDER — AZITHROMYCIN 250 MG PO TABS: ORAL_TABLET | ORAL | Status: DC

## 2013-09-28 MED ORDER — HYDROCODONE-HOMATROPINE 5-1.5 MG/5ML PO SYRP: 5.0000 mL | ORAL_SOLUTION | Freq: Three times a day (TID) | ORAL | Status: DC | PRN

## 2013-09-28 NOTE — Patient Instructions (Signed)

## 2013-09-28 NOTE — Progress Notes (Signed)
   Subjective:    Patient ID: Luis Deleon, male    DOB: 04/02/1941, 72 y.o.   MRN: [id number]  HPI Patient here today with C/O cough and congestion- started 5 days ago- OTC meds include mucinex with very little relief.    Review of Systems  Constitutional: Positive for fever (low grade). Negative for chills and appetite change.  HENT: Positive for congestion, postnasal drip, rhinorrhea, sinus pressure and sore throat. Negative for ear pain and trouble swallowing.   Respiratory: Positive for cough (productive- off white and thick).   Cardiovascular: Negative.   Gastrointestinal: Negative.   Genitourinary: Negative.   Musculoskeletal: Negative.   All other systems reviewed and are negative.       Objective:   Physical Exam  Constitutional: He appears well-developed and well-nourished. No distress.  HENT:  Right Ear: Hearing, tympanic membrane, external ear and ear canal normal.  Left Ear: Hearing, tympanic membrane, external ear and ear canal normal.  Nose: Mucosal edema and rhinorrhea present. Right sinus exhibits maxillary sinus tenderness (mild). Right sinus exhibits no frontal sinus tenderness. Left sinus exhibits maxillary sinus tenderness (mild).  Mouth/Throat: Uvula is midline, oropharynx is clear and moist and mucous membranes are normal.  Eyes: Conjunctivae and EOM are normal. Pupils are equal, round, and reactive to light.  Neck: Normal range of motion. Neck supple.  Cardiovascular: Normal rate, regular rhythm and normal heart sounds.   Pulmonary/Chest: Breath sounds normal. No respiratory distress. He has no wheezes.  Deep dry cough  Lymphadenopathy:    He has no cervical adenopathy.  Skin: Skin is warm. He is not diaphoretic.  Psychiatric: He has a normal mood and affect. His behavior is normal. Judgment and thought content normal.    BP 142/81  Pulse 105  Temp(Src) 99 F (37.2 C) (Oral)  Ht 5' 9"  (1.753 m)  Wt 235 lb (106.595 kg)  BMI 34.69 kg/m2         Assessment & Plan:   1. Bronchitis, acute   2. Sinusitis, acute    Meds ordered this encounter  Medications  . azithromycin (ZITHROMAX Z-PAK) 250 MG tablet    Sig: As directed    Dispense:  6 each    Refill:  0    Order Specific Question:  Supervising Provider    Answer:  Chipper Herb [[id number]]  . HYDROcodone-homatropine (HYCODAN) 5-1.5 MG/5ML syrup    Sig: Take 5 mLs by mouth every 8 (eight) hours as needed for cough.    Dispense:  120 mL    Refill:  0    Order Specific Question:  Supervising Provider    Answer:  Chipper Herb [[id number]]   1. Take meds as prescribed 2. Use a cool mist humidifier especially during the winter months and when heat has  been humid. 3. Use saline nose sprays frequently 4. Saline irrigations of the nose can be very helpful if done frequently.  * 4X daily for 1 week*  * Use of a nettie pot can be helpful with this. Follow directions with this* 5. Drink plenty of fluids 6. Keep thermostat turn down low 7.For any cough or congestion  Use plain Mucinex- regular strength or max strength is fine   * Children- consult with Pharmacist for dosing 8. For fever or aces or pains- take tylenol or ibuprofen appropriate for age and weight.  * for fevers greater than 101 orally you may alternate ibuprofen and tylenol every  3 hours.   Mary-Margaret Hassell Done, FNP

## 2013-12-09 ENCOUNTER — Encounter: Payer: Self-pay | Admitting: Family Medicine

## 2013-12-09 ENCOUNTER — Ambulatory Visit (INDEPENDENT_AMBULATORY_CARE_PROVIDER_SITE_OTHER): Payer: Medicare Other | Admitting: Family Medicine

## 2013-12-09 ENCOUNTER — Telehealth: Payer: Self-pay | Admitting: Internal Medicine

## 2013-12-09 VITALS — BP 138/80 | HR 85 | Temp 97.1°F | Ht 69.0 in | Wt 238.0 lb

## 2013-12-09 DIAGNOSIS — K130 Diseases of lips: Secondary | ICD-10-CM

## 2013-12-09 DIAGNOSIS — I10 Essential (primary) hypertension: Secondary | ICD-10-CM

## 2013-12-09 DIAGNOSIS — Z8546 Personal history of malignant neoplasm of prostate: Secondary | ICD-10-CM

## 2013-12-09 DIAGNOSIS — H6123 Impacted cerumen, bilateral: Secondary | ICD-10-CM

## 2013-12-09 DIAGNOSIS — E785 Hyperlipidemia, unspecified: Secondary | ICD-10-CM

## 2013-12-09 DIAGNOSIS — K5090 Crohn's disease, unspecified, without complications: Secondary | ICD-10-CM

## 2013-12-09 DIAGNOSIS — M109 Gout, unspecified: Secondary | ICD-10-CM

## 2013-12-09 DIAGNOSIS — E559 Vitamin D deficiency, unspecified: Secondary | ICD-10-CM

## 2013-12-09 DIAGNOSIS — H6120 Impacted cerumen, unspecified ear: Secondary | ICD-10-CM

## 2013-12-09 LAB — POCT CBC
GRANULOCYTE PERCENT: 66.9 % (ref 37–80)
HCT, POC: 49.4 % (ref 43.5–53.7)
HEMOGLOBIN: 15.9 g/dL (ref 14.1–18.1)
Lymph, poc: 2.2 (ref 0.6–3.4)
MCH, POC: 29.7 pg (ref 27–31.2)
MCHC: 32.2 g/dL (ref 31.8–35.4)
MCV: 92.1 fL (ref 80–97)
MPV: 8.7 fL (ref 0–99.8)
PLATELET COUNT, POC: 160 10*3/uL (ref 142–424)
POC Granulocyte: 5.5 (ref 2–6.9)
POC LYMPH PERCENT: 26.5 %L (ref 10–50)
RBC: 5.4 M/uL (ref 4.69–6.13)
RDW, POC: 15.8 %
WBC: 8.2 10*3/uL (ref 4.6–10.2)

## 2013-12-09 MED ORDER — OLMESARTAN MEDOXOMIL-HCTZ 40-12.5 MG PO TABS: ORAL_TABLET | ORAL | Status: DC

## 2013-12-09 NOTE — Telephone Encounter (Signed)
It is OK for him to take Shingles injection now. He is no longer on 6MP

## 2013-12-09 NOTE — Patient Instructions (Addendum)
Continue current medications. Continue good therapeutic lifestyle changes which include good diet and exercise. Fall precautions discussed with patient. If an FOBT was given today- please return it to our front desk. If you are over 71 years old - you may need Prevnar 67 or the adult Pneumonia vaccine. Called with the results of the lab work once these results are available. Please arrange a visit with the dentist to evaluate the recent dental work to see if this could be causing irritation with the lower lid up Use saline nose spray frequently for the nasal congestion Take Mucinex maximum strength one twice daily for cough and congestion over-the-counter You can purchase and earwax softener, debrox, over-the-counter and use this nightly for 3 nights in her low in the right ear canal. Repeat this process in one week, this will soften the ear wax so that it will come out of the ear canal without causing impaction

## 2013-12-09 NOTE — Telephone Encounter (Signed)
PCP wants to know if patient can have shingles shot. Please, advise.

## 2013-12-09 NOTE — Telephone Encounter (Signed)
Spoke with Roselyn Reef at Dr. Tawanna Sat office and gave her Dr. Nichola Sizer recommendation.

## 2013-12-09 NOTE — Progress Notes (Signed)
Subjective:    Patient ID: Luis Deleon, male    DOB: 1941/04/09, 73 y.o.   MRN: [id number]  HPI Pt here for follow up and management of chronic medical problems. The patient indicates he has had several calls this winter. And he is recovering from one nail. He also describes this irritation on the inner surface of the lower lip on the left date first became noticeable after some dental work. It has come and gone for several weeks. The patient also questions his need for a shingle shot and this was deferred in the past because of weak faint some medication that he was taking which he is no longer taking. We will check with the gastroenterologist on the shingle shot and whether he is a candidate for this.      Patient Active Problem List   Diagnosis Date Noted  . DOE (dyspnea on exertion) 08/11/2013  . 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 POLYCYTHEMIA 11/03/2008  . LIVER FUNCTION TESTS, ABNORMAL, HX OF 09/24/2008  . HYPERLIPIDEMIA 07/28/2008  . GOUT, UNSPECIFIED 07/28/2008  . HYPERTENSION 07/28/2008  . CROHN'S DISEASE 07/28/2008  . DIVERTICULOSIS, COLON 07/28/2008  . PROSTATE CANCER, PERSONAL HX 07/28/2008  . SMALL BOWEL OBSTRUCTION, HX OF 07/28/2008  . NEPHROLITHIASIS, HX OF 07/28/2008   Outpatient Encounter Prescriptions as of 12/09/2013  Medication Sig  . allopurinol (ZYLOPRIM) 100 MG tablet 200 mg daily.  Marland Kitchen amLODipine (NORVASC) 5 MG tablet TAKE 2 TABLETS BY MOUTH DAILY AS DIRECTED  . aspirin 81 MG EC tablet Take 81 mg by mouth daily.    Marland Kitchen atorvastatin (LIPITOR) 40 MG tablet TAKE 1 TABLET EVERY DAY AS DIRECTED  . Cholecalciferol (VITAMIN D3) 1000 UNITS CAPS Take 1 capsule by mouth daily.    . colchicine (COLCRYS) 0.6 MG tablet Take 1 tablet (0.6 mg total) by mouth daily.  Marland Kitchen gemfibrozil (LOPID) 600 MG tablet Take 600 mg by mouth 3 (three) times a week.   Marland Kitchen KLOR-CON M20 20 MEQ tablet TAKE 1 TABLET EVERY DAY  . olmesartan-hydrochlorothiazide (BENICAR HCT) 40-12.5 MG per  tablet TAKE HALF TABLET BY MOUTH ONCE A DAY  . OMEGA-3 1000 MG CAPS Take 2 capsules by mouth 2 (two) times daily.  . [DISCONTINUED] azithromycin (ZITHROMAX Z-PAK) 250 MG tablet As directed  . [DISCONTINUED] HYDROcodone-homatropine (HYCODAN) 5-1.5 MG/5ML syrup Take 5 mLs by mouth every 8 (eight) hours as needed for cough.    Review of Systems  Constitutional: Negative.   HENT: Negative.   Eyes: Negative.   Respiratory: Negative.   Cardiovascular: Negative.   Gastrointestinal: Negative.   Endocrine: Negative.   Genitourinary: Negative.   Musculoskeletal: Negative.   Skin: Negative.        Blister on inside of lip  Allergic/Immunologic: Negative.   Neurological: Negative.   Hematological: Negative.   Psychiatric/Behavioral: Negative.        Objective:   Physical Exam  Nursing note and vitals reviewed. Constitutional: He is oriented to person, place, and time. He appears well-developed and well-nourished. No distress.  HENT:  Head: Normocephalic and atraumatic.  Right Ear: External ear normal.  Left Ear: External ear normal.  Mouth/Throat: No oropharyngeal exudate.  Small area of erythema left lower lip Turbinate swelling and edema laterally  Eyes: Conjunctivae and EOM are normal. Pupils are equal, round, and reactive to light. Right eye exhibits no discharge. Left eye exhibits no discharge. No scleral icterus.  Neck: Normal range of motion. Neck supple. No thyromegaly present.  Cardiovascular: Normal rate, regular rhythm, normal  heart sounds and intact distal pulses.  Exam reveals no gallop and no friction rub.   No murmur heard. At 84 per minute  Pulmonary/Chest: Effort normal and breath sounds normal. No respiratory distress. He has no wheezes. He has no rales. He exhibits no tenderness.  Slight upper airway irritation with coughing  Abdominal: Soft. Bowel sounds are normal. He exhibits no mass. There is tenderness (slightabdominal tenderness secondary to previous surgery).  There is no rebound and no guarding.  Musculoskeletal: Normal range of motion. He exhibits no edema and no tenderness.  Bilateral heel calluses  Lymphadenopathy:    He has no cervical adenopathy.  Neurological: He is alert and oriented to person, place, and time. He has normal reflexes. No cranial nerve deficit.  Skin: Skin is warm and dry. No rash noted. No erythema. No pallor.  Psychiatric: He has a normal mood and affect. His behavior is normal. Judgment and thought content normal.   BP 138/80  Pulse 85  Temp(Src) 97.1 F (36.2 C) (Oral)  Ht 5' 9"  (1.753 m)  Wt 238 lb (107.956 kg)  BMI 35.13 kg/m2        Assessment & Plan:   1. HYPERLIPIDEMIA - POCT CBC - NMR, lipoprofile  2. HYPERTENSION - POCT CBC - BMP8+EGFR - Hepatic function panel  3. PROSTATE CANCER, PERSONAL HX - POCT CBC  4. Vitamin D deficiency - Vit D  25 hydroxy (rtn osteoporosis monitoring)  5. Inflammation of lips -Make appointment with the dentist  6. Gout, unspecified -Continue current management  7. Impacted cerumen of both ears -Debrox  8. Regional enteritis of unspecified site -Continue to followup with a gastroenterologist  Meds ordered this encounter  Medications  . olmesartan-hydrochlorothiazide (BENICAR HCT) 40-12.5 MG per tablet    Sig: TAKE HALF TABLET BY MOUTH ONCE A DAY    Dispense:  30 tablet    Refill:  6     Patient Instructions  Continue current medications. Continue good therapeutic lifestyle changes which include good diet and exercise. Fall precautions discussed with patient. If an FOBT was given today- please return it to our front desk. If you are over 43 years old - you may need Prevnar 90 or the adult Pneumonia vaccine. Called with the results of the lab work once these results are available. Please arrange a visit with the dentist to evaluate the recent dental work to see if this could be causing irritation with the lower lid up Use saline nose spray frequently  for the nasal congestion Take Mucinex maximum strength one twice daily for cough and congestion over-the-counter You can purchase and earwax softener, debrox, over-the-counter and use this nightly for 3 nights in her low in the right ear canal. Repeat this process in one week, this will soften the ear wax so that it will come out of the ear canal without causing impaction     Arrie Senate MD

## 2013-12-11 LAB — HEPATIC FUNCTION PANEL
ALBUMIN: 3.9 g/dL (ref 3.5–4.8)
ALT: 27 IU/L (ref 0–44)
AST: 31 IU/L (ref 0–40)
Alkaline Phosphatase: 69 IU/L (ref 39–117)
Bilirubin, Direct: 0.26 mg/dL (ref 0.00–0.40)
TOTAL PROTEIN: 6.5 g/dL (ref 6.0–8.5)
Total Bilirubin: 1.3 mg/dL — ABNORMAL HIGH (ref 0.0–1.2)

## 2013-12-11 LAB — BMP8+EGFR
BUN/Creatinine Ratio: 11 (ref 10–22)
BUN: 16 mg/dL (ref 8–27)
CO2: 26 mmol/L (ref 18–29)
CREATININE: 1.42 mg/dL — AB (ref 0.76–1.27)
Calcium: 9.5 mg/dL (ref 8.6–10.2)
Chloride: 101 mmol/L (ref 97–108)
GFR calc Af Amer: 57 mL/min/{1.73_m2} — ABNORMAL LOW (ref >59)
GFR calc non Af Amer: 50 mL/min/{1.73_m2} — ABNORMAL LOW (ref >59)
GLUCOSE: 93 mg/dL (ref 65–99)
Potassium: 3.4 mmol/L — ABNORMAL LOW (ref 3.5–5.2)
SODIUM: 143 mmol/L (ref 134–144)

## 2013-12-11 LAB — VITAMIN D 25 HYDROXY (VIT D DEFICIENCY, FRACTURES): VIT D 25 HYDROXY: 31.2 ng/mL (ref 30.0–100.0)

## 2013-12-11 LAB — NMR, LIPOPROFILE
Cholesterol: 138 mg/dL (ref <200)
HDL CHOLESTEROL BY NMR: 39 mg/dL — AB (ref >=40)
HDL Particle Number: 27.8 umol/L — ABNORMAL LOW (ref >=30.5)
LDL Particle Number: 1041 nmol/L — ABNORMAL HIGH (ref <1000)
LDL Size: 20.4 nm — ABNORMAL LOW (ref >20.5)
LDLC SERPL CALC-MCNC: 63 mg/dL (ref <100)
LP-IR Score: 62 — ABNORMAL HIGH (ref <=45)
Small LDL Particle Number: 582 nmol/L — ABNORMAL HIGH (ref <=527)
Triglycerides by NMR: 178 mg/dL — ABNORMAL HIGH (ref <150)

## 2013-12-14 ENCOUNTER — Telehealth: Payer: Self-pay | Admitting: Family Medicine

## 2013-12-14 NOTE — Telephone Encounter (Signed)
Luis Deleon, please call Dr. Nichola Sizer office and make sure that the shingle shot is okay to give to him and let patient know this

## 2013-12-14 NOTE — Telephone Encounter (Signed)
Did you find out if it was okay to get his Zostavax shot from Dr.Brody?

## 2013-12-14 NOTE — Telephone Encounter (Signed)
Pt aware brodie approves zostavax

## 2013-12-21 ENCOUNTER — Other Ambulatory Visit: Payer: Self-pay | Admitting: Family Medicine

## 2013-12-24 ENCOUNTER — Ambulatory Visit (INDEPENDENT_AMBULATORY_CARE_PROVIDER_SITE_OTHER): Payer: Medicare Other | Admitting: *Deleted

## 2013-12-24 DIAGNOSIS — Z23 Encounter for immunization: Secondary | ICD-10-CM

## 2013-12-24 NOTE — Patient Instructions (Signed)
Shingles Vaccine What You Need to Know WHAT IS SHINGLES?  Shingles is a painful skin rash, often with blisters. It is also called Herpes Zoster or just Zoster.  A shingles rash usually appears on one side of the face or body and lasts from 2 to 4 weeks. Its main symptom is pain, which can be quite severe. Other symptoms of shingles can include fever, headache, chills, and upset stomach. Very rarely, a shingles infection can lead to pneumonia, hearing problems, blindness, brain inflammation (encephalitis), or death.  For about 1 person in 5, severe pain can continue even after the rash clears up. This is called post-herpetic neuralgia.  Shingles is caused by the Varicella Zoster virus. This is the same virus that causes chickenpox. Only someone who has had a case of chickenpox or rarely, has gotten chickenpox vaccine, can get shingles. The virus stays in your body. It can reappear many years later to cause a case of shingles.  You cannot catch shingles from another person with shingles. However, a person who has never had chickenpox (or chickenpox vaccine) could get chickenpox from someone with shingles. This is not very common.  Shingles is far more common in people 18 and older than in younger people. It is also more common in people whose immune systems are weakened because of a disease such as cancer or drugs such as steroids or chemotherapy.  At least 1 million people get shingles per year in the [place]. SHINGLES VACCINE  A vaccine for shingles was licensed in 7673. In clinical trials, the vaccine reduced the risk of shingles by 50%. It can also reduce the pain in people who still get shingles after being vaccinated.  A single dose of shingles vaccine is recommended for adults 53 years of age and older. SOME PEOPLE SHOULD NOT GET SHINGLES VACCINE OR SHOULD WAIT A person should not get shingles vaccine if he or she:  Has ever had a life-threatening allergic reaction to gelatin, the  antibiotic neomycin, or any other component of shingles vaccine. Tell your caregiver if you have any severe allergies.  Has a weakened immune system because of current:  AIDS or another disease that affects the immune system.  Treatment with drugs that affect the immune system, such as prolonged use of high-dose steroids.  Cancer treatment, such as radiation or chemotherapy.  Cancer affecting the bone marrow or lymphatic system, such as leukemia or lymphoma.  Is pregnant, or might be pregnant. Women should not become pregnant until at least 4 weeks after getting shingles vaccine. Someone with a minor illness, such as a cold, may be vaccinated. Anyone with a moderate or severe acute illness should usually wait until he or she recovers before getting the vaccine. This includes anyone with a temperature of 101.3 F (38 C) or higher. WHAT ARE THE RISKS FROM SHINGLES VACCINE?  A vaccine, like any medicine, could possibly cause serious problems, such as severe allergic reactions. However, the risk of a vaccine causing serious harm, or death, is extremely small.  No serious problems have been identified with shingles vaccine. Mild Problems  Redness, soreness, swelling, or itching at the site of the injection (about 1 person in 3).  Headache (about 1 person in 50). Like all vaccines, shingles vaccine is being closely monitored for unusual or severe problems. WHAT IF THERE IS A MODERATE OR SEVERE REACTION? What should I look for? Any unusual condition, such as a severe allergic reaction or a high fever. If a severe allergic reaction  occurred, it would be within a few minutes to an hour after the shot. Signs of a serious allergic reaction can include difficulty breathing, weakness, hoarseness or wheezing, a fast heartbeat, hives, dizziness, paleness, or swelling of the throat. What should I do?  Call your caregiver, or get the person to a caregiver right away.  Tell the caregiver what  happened, the date and time it happened, and when the vaccination was given.  Ask the caregiver to report the reaction by filing a Vaccine Adverse Event Reporting System (VAERS) form. Or, you can file this report through the VAERS web site at www.vaers.SamedayNews.es or by calling 570-063-1670. VAERS does not provide medical advice. HOW CAN I LEARN MORE?  Ask your caregiver. He or she can give you the vaccine package insert or suggest other sources of information.  Contact the Centers for Disease Control and Prevention (CDC):  Call (938)486-5514 (1-800-CDC-INFO).  Visit the CDC website at http://hunter.com/ CDC Shingles Vaccine VIS (07/20/08) Document Released: 07/29/2006 Document Revised: 12/24/2011 Document Reviewed: 01/21/2013 [employer] Patient Information 2014 [place].

## 2013-12-24 NOTE — Progress Notes (Signed)
zostavax given and tolerated well

## 2014-01-04 ENCOUNTER — Telehealth: Payer: Self-pay | Admitting: Family Medicine

## 2014-01-04 ENCOUNTER — Other Ambulatory Visit (INDEPENDENT_AMBULATORY_CARE_PROVIDER_SITE_OTHER): Payer: Medicare Other

## 2014-01-04 DIAGNOSIS — R7989 Other specified abnormal findings of blood chemistry: Secondary | ICD-10-CM

## 2014-01-04 DIAGNOSIS — K1379 Other lesions of oral mucosa: Secondary | ICD-10-CM

## 2014-01-04 NOTE — Telephone Encounter (Signed)
Please arrange for the patient to see an oral surgeon because of this persistent lesion

## 2014-01-04 NOTE — Telephone Encounter (Signed)
dwm - i put in referral for oral surgeon in [place].

## 2014-01-05 LAB — POTASSIUM: Potassium: 3.4 mmol/L — ABNORMAL LOW (ref 3.5–5.2)

## 2014-01-07 ENCOUNTER — Other Ambulatory Visit: Payer: Self-pay | Admitting: *Deleted

## 2014-01-07 DIAGNOSIS — E876 Hypokalemia: Secondary | ICD-10-CM

## 2014-01-07 MED ORDER — POTASSIUM CHLORIDE CRYS ER 20 MEQ PO TBCR: 20.0000 meq | EXTENDED_RELEASE_TABLET | ORAL | Status: DC

## 2014-01-07 NOTE — Addendum Note (Signed)
Addended by: Ilean China on: 01/07/2014 10:31 AM   Modules accepted: Orders

## 2014-02-03 ENCOUNTER — Other Ambulatory Visit: Payer: Self-pay | Admitting: Family Medicine

## 2014-02-04 ENCOUNTER — Encounter: Payer: Self-pay | Admitting: *Deleted

## 2014-03-14 ENCOUNTER — Other Ambulatory Visit: Payer: Self-pay | Admitting: Family Medicine

## 2014-03-31 ENCOUNTER — Ambulatory Visit: Payer: Medicare Other | Admitting: Family Medicine

## 2014-04-02 ENCOUNTER — Encounter: Payer: Self-pay | Admitting: Family Medicine

## 2014-04-02 ENCOUNTER — Ambulatory Visit (INDEPENDENT_AMBULATORY_CARE_PROVIDER_SITE_OTHER): Payer: Medicare Other | Admitting: Family Medicine

## 2014-04-02 VITALS — BP 133/72 | HR 78 | Temp 99.0°F | Ht 69.0 in | Wt 239.0 lb

## 2014-04-02 DIAGNOSIS — I10 Essential (primary) hypertension: Secondary | ICD-10-CM

## 2014-04-02 DIAGNOSIS — M109 Gout, unspecified: Secondary | ICD-10-CM

## 2014-04-02 DIAGNOSIS — E785 Hyperlipidemia, unspecified: Secondary | ICD-10-CM

## 2014-04-02 DIAGNOSIS — E8881 Metabolic syndrome: Secondary | ICD-10-CM

## 2014-04-02 DIAGNOSIS — E559 Vitamin D deficiency, unspecified: Secondary | ICD-10-CM

## 2014-04-02 LAB — POCT GLYCOSYLATED HEMOGLOBIN (HGB A1C): HEMOGLOBIN A1C: 5.3

## 2014-04-02 LAB — POCT CBC
Granulocyte percent: 71 %G (ref 37–80)
HCT, POC: 49.7 % (ref 43.5–53.7)
Hemoglobin: 16.1 g/dL (ref 14.1–18.1)
LYMPH, POC: 1.8 (ref 0.6–3.4)
MCH, POC: 30.5 pg (ref 27–31.2)
MCHC: 32.4 g/dL (ref 31.8–35.4)
MCV: 94.2 fL (ref 80–97)
MPV: 8.7 fL (ref 0–99.8)
POC Granulocyte: 5 (ref 2–6.9)
POC LYMPH %: 25.6 % (ref 10–50)
Platelet Count, POC: 169 10*3/uL (ref 142–424)
RBC: 5.3 M/uL (ref 4.69–6.13)
RDW, POC: 14.9 %
WBC: 7 10*3/uL (ref 4.6–10.2)

## 2014-04-02 NOTE — Progress Notes (Signed)
Subjective:    Patient ID: Luis Deleon, male    DOB: January 03, 1941, 73 y.o.   MRN: [id number]  HPI Pt here for follow up and management of chronic medical problems. The patient is here today for routine followup. He is due to get lab work. He has no complaints. He is up-to-date on all of his health maintenance issues. No refills are necessary on his medication today. The patient denies chest pain shortness of breath or any problems with his Crohn's disease. He basically has no significant complaints appear       Patient Active Problem List   Diagnosis Date Noted  . Metabolic syndrome 99/24/2683  . DOE (dyspnea on exertion) 08/11/2013  . 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 POLYCYTHEMIA 11/03/2008  . LIVER FUNCTION TESTS, ABNORMAL, HX OF 09/24/2008  . HYPERLIPIDEMIA 07/28/2008  . GOUT, UNSPECIFIED 07/28/2008  . HYPERTENSION 07/28/2008  . CROHN'S DISEASE 07/28/2008  . DIVERTICULOSIS, COLON 07/28/2008  . PROSTATE CANCER, PERSONAL HX 07/28/2008  . SMALL BOWEL OBSTRUCTION, HX OF 07/28/2008  . NEPHROLITHIASIS, HX OF 07/28/2008   Outpatient Encounter Prescriptions as of 04/02/2014  Medication Sig  . allopurinol (ZYLOPRIM) 100 MG tablet TAKE 2 TABLETS DAILY AS DIRECTED  . amLODipine (NORVASC) 5 MG tablet TAKE 2 TABLETS BY MOUTH DAILY AS DIRECTED  . aspirin 81 MG EC tablet Take 81 mg by mouth daily.    Marland Kitchen atorvastatin (LIPITOR) 40 MG tablet TAKE 1 TABLET EVERY DAY AS DIRECTED  . Cholecalciferol (VITAMIN D3) 1000 UNITS CAPS Take 1 capsule by mouth daily.    Marland Kitchen gemfibrozil (LOPID) 600 MG tablet Take 600 mg by mouth 3 (three) times a week.   Marland Kitchen KLOR-CON M20 20 MEQ tablet 1 TAB TWICE DAILY ON MON, WED AND FRIDAY-DAILY EVERY OTHER DAY  . olmesartan-hydrochlorothiazide (BENICAR HCT) 40-12.5 MG per tablet TAKE HALF TABLET BY MOUTH ONCE A DAY  . OMEGA-3 1000 MG CAPS Take 2 capsules by mouth 2 (two) times daily.  . colchicine (COLCRYS) 0.6 MG tablet Take 1 tablet (0.6 mg total) by mouth  daily.    Review of Systems  Constitutional: Negative.   HENT: Negative.   Eyes: Negative.   Respiratory: Negative.   Cardiovascular: Negative.   Gastrointestinal: Negative.   Endocrine: Negative.   Genitourinary: Negative.   Musculoskeletal: Negative.   Skin: Negative.   Allergic/Immunologic: Negative.   Neurological: Negative.   Hematological: Negative.   Psychiatric/Behavioral: Negative.        Objective:   Physical Exam  Nursing note and vitals reviewed. Constitutional: He is oriented to person, place, and time. He appears well-developed and well-nourished. No distress.  HENT:  Head: Normocephalic and atraumatic.  Right Ear: External ear normal.  Left Ear: External ear normal.  Mouth/Throat: Oropharynx is clear and moist. No oropharyngeal exudate.  Nasal turbinate congestion and drainage  Eyes: Conjunctivae and EOM are normal. Pupils are equal, round, and reactive to light. Right eye exhibits no discharge. Left eye exhibits no discharge. No scleral icterus.  Neck: Normal range of motion. Neck supple. No tracheal deviation present. No thyromegaly present.  Carotid bruits are not present.  Cardiovascular: Normal rate, regular rhythm, normal heart sounds and intact distal pulses.  Exam reveals no gallop and no friction rub.   No murmur heard. At 72 per minute  Pulmonary/Chest: Effort normal and breath sounds normal. No respiratory distress. He has no wheezes. He has no rales. He exhibits no tenderness.  No axillary adenopathy  Abdominal: Soft. Bowel sounds are normal. He exhibits  no mass. There is no tenderness. There is no rebound and no guarding.  Obesity without masses or tenderness.  Musculoskeletal: Normal range of motion. He exhibits no edema and no tenderness.  Lymphadenopathy:    He has no cervical adenopathy.  Neurological: He is alert and oriented to person, place, and time. He has normal reflexes. No cranial nerve deficit.  Skin: Skin is warm and dry. Rash  noted. No erythema. No pallor.  Patient has tinea cruris right inguinal area, he is treating this with medication he has at home and indicates it is improving.  Psychiatric: He has a normal mood and affect. His behavior is normal. Judgment and thought content normal.   BP 133/72  Pulse 78  Temp(Src) 99 F (37.2 C) (Oral)  Ht 5' 9"  (1.753 m)  Wt 239 lb (108.41 kg)  BMI 35.28 kg/m2        Assessment & Plan:  1. HYPERLIPIDEMIA - POCT CBC - NMR, lipoprofile  2. HYPERTENSION - POCT CBC - BMP8+EGFR - Hepatic function panel  3. Gout, unspecified - POCT CBC  4. Metabolic syndrome - POCT CBC - POCT glycosylated hemoglobin (Hb A1C)  5. Vitamin D deficiency - Vit D  25 hydroxy (rtn osteoporosis monitoring)   Patient Instructions                       Medicare Annual Wellness Visit  [facility] and the medical providers at [facility] strive to bring you the best medical care.  In doing so we not only want to address your current medical conditions and concerns but also to detect new conditions early and prevent illness, disease and health-related problems.    Medicare offers a yearly Wellness Visit which allows our clinical staff to assess your need for preventative services including immunizations, lifestyle education, counseling to decrease risk of preventable diseases and screening for fall risk and other medical concerns.    This visit is provided free of charge (no copay) for all Medicare recipients. The clinical pharmacists at [facility] have begun to conduct these Wellness Visits which will also include a thorough review of all your medications.    As you primary medical [facility]ker[facility] recommend that you make an appointment for your Annual Wellness Visit if you have not done so already this year.  You may set up this appointment before you leave today or you may call back [phone]) and schedule an appointment.  Please make sure when  you call that you mention that you are scheduling your Annual Wellness Visit with the clinical pharmacist so that the appointment may be made for the proper length of time.      Continue current medications. Continue good therapeutic lifestyle changes which include good diet and exercise. Fall precautions discussed with patient. If an FOBT was given today- please return it to our front desk. If you are over 68 years old - you may need Prevnar 64 or the adult Pneumonia vaccine.     Arrie Senate MD

## 2014-04-02 NOTE — Patient Instructions (Signed)
Medicare Annual Wellness Visit  [facility] and the medical providers at [facility] strive to bring you the best medical care.  In doing so we not only want to address your current medical conditions and concerns but also to detect new conditions early and prevent illness, disease and health-related problems.    Medicare offers a yearly Wellness Visit which allows our clinical staff to assess your need for preventative services including immunizations, lifestyle education, counseling to decrease risk of preventable diseases and screening for fall risk and other medical concerns.    This visit is provided free of charge (no copay) for all Medicare recipients. The clinical pharmacists at [facility] have begun to conduct these Wellness Visits which will also include a thorough review of all your medications.    As you primary medical provider recommend that you make an appointment for your Annual Wellness Visit if you have not done so already this year.  You may set up this appointment before you leave today or you may call back [phone]) and schedule an appointment.  Please make sure when you call that you mention that you are scheduling your Annual Wellness Visit with the clinical pharmacist so that the appointment may be made for the proper length of time.      Continue current medications. Continue good therapeutic lifestyle changes which include good diet and exercise. Fall precautions discussed with patient. If an FOBT was given today- please return it to our front desk. If you are over 14 years old - you may need Prevnar 46 or the adult Pneumonia vaccine.

## 2014-04-03 LAB — BMP8+EGFR
BUN/Creatinine Ratio: 8 — ABNORMAL LOW (ref 10–22)
BUN: 12 mg/dL (ref 8–27)
CO2: 24 mmol/L (ref 18–29)
CREATININE: 1.56 mg/dL — AB (ref 0.76–1.27)
Calcium: 9.6 mg/dL (ref 8.6–10.2)
Chloride: 100 mmol/L (ref 97–108)
GFR calc non Af Amer: 44 mL/min/{1.73_m2} — ABNORMAL LOW (ref >59)
GFR, EST AFRICAN AMERICAN: 51 mL/min/{1.73_m2} — AB (ref >59)
Glucose: 89 mg/dL (ref 65–99)
Potassium: 4.3 mmol/L (ref 3.5–5.2)
Sodium: 142 mmol/L (ref 134–144)

## 2014-04-03 LAB — NMR, LIPOPROFILE
CHOLESTEROL: 128 mg/dL (ref 100–199)
HDL CHOLESTEROL BY NMR: 37 mg/dL — AB (ref >39)
HDL PARTICLE NUMBER: 27.9 umol/L — AB (ref >=30.5)
LDL Particle Number: 897 nmol/L (ref <1000)
LDL Size: 20.4 nm (ref >20.5)
LDLC SERPL CALC-MCNC: 59 mg/dL (ref 0–99)
LP-IR Score: 65 — ABNORMAL HIGH (ref <=45)
SMALL LDL PARTICLE NUMBER: 458 nmol/L (ref <=527)
Triglycerides by NMR: 158 mg/dL — ABNORMAL HIGH (ref 0–149)

## 2014-04-03 LAB — VITAMIN D 25 HYDROXY (VIT D DEFICIENCY, FRACTURES): Vit D, 25-Hydroxy: 33.6 ng/mL (ref 30.0–100.0)

## 2014-04-03 LAB — HEPATIC FUNCTION PANEL
ALBUMIN: 4.1 g/dL (ref 3.5–4.8)
ALK PHOS: 67 IU/L (ref 39–117)
ALT: 24 IU/L (ref 0–44)
AST: 32 IU/L (ref 0–40)
Bilirubin, Direct: 0.32 mg/dL (ref 0.00–0.40)
TOTAL PROTEIN: 6.5 g/dL (ref 6.0–8.5)
Total Bilirubin: 1.6 mg/dL — ABNORMAL HIGH (ref 0.0–1.2)

## 2014-05-10 ENCOUNTER — Other Ambulatory Visit: Payer: Self-pay | Admitting: Family Medicine

## 2014-05-23 ENCOUNTER — Other Ambulatory Visit: Payer: Self-pay | Admitting: Family Medicine

## 2014-06-04 ENCOUNTER — Encounter: Payer: Self-pay | Admitting: Internal Medicine

## 2014-06-06 ENCOUNTER — Other Ambulatory Visit: Payer: Self-pay | Admitting: Family Medicine

## 2014-06-16 ENCOUNTER — Other Ambulatory Visit: Payer: Self-pay | Admitting: Family Medicine

## 2014-07-16 ENCOUNTER — Encounter: Payer: Self-pay | Admitting: Internal Medicine

## 2014-07-19 ENCOUNTER — Ambulatory Visit (INDEPENDENT_AMBULATORY_CARE_PROVIDER_SITE_OTHER): Payer: Medicare Other | Admitting: Family Medicine

## 2014-07-19 ENCOUNTER — Encounter: Payer: Self-pay | Admitting: Family Medicine

## 2014-07-19 VITALS — BP 142/82 | HR 73 | Temp 97.9°F | Ht 69.0 in | Wt 237.0 lb

## 2014-07-19 DIAGNOSIS — E8881 Metabolic syndrome: Secondary | ICD-10-CM

## 2014-07-19 DIAGNOSIS — R1013 Epigastric pain: Secondary | ICD-10-CM

## 2014-07-19 DIAGNOSIS — G8929 Other chronic pain: Secondary | ICD-10-CM

## 2014-07-19 DIAGNOSIS — Z23 Encounter for immunization: Secondary | ICD-10-CM

## 2014-07-19 DIAGNOSIS — I10 Essential (primary) hypertension: Secondary | ICD-10-CM

## 2014-07-19 DIAGNOSIS — Z8546 Personal history of malignant neoplasm of prostate: Secondary | ICD-10-CM

## 2014-07-19 DIAGNOSIS — E785 Hyperlipidemia, unspecified: Secondary | ICD-10-CM

## 2014-07-19 DIAGNOSIS — E559 Vitamin D deficiency, unspecified: Secondary | ICD-10-CM

## 2014-07-19 DIAGNOSIS — K50919 Crohn's disease, unspecified, with unspecified complications: Secondary | ICD-10-CM

## 2014-07-19 LAB — POCT CBC
Granulocyte percent: 71.1 %G (ref 37–80)
HCT, POC: 48.8 % (ref 43.5–53.7)
HEMOGLOBIN: 16.1 g/dL (ref 14.1–18.1)
LYMPH, POC: 1.6 (ref 0.6–3.4)
MCH: 30 pg (ref 27–31.2)
MCHC: 33 g/dL (ref 31.8–35.4)
MCV: 90.9 fL (ref 80–97)
MPV: 9.9 fL (ref 0–99.8)
PLATELET COUNT, POC: 145 10*3/uL (ref 142–424)
POC Granulocyte: 4.6 (ref 2–6.9)
POC LYMPH PERCENT: 24.4 %L (ref 10–50)
RBC: 5.4 M/uL (ref 4.69–6.13)
RDW, POC: 15 %
WBC: 6.4 10*3/uL (ref 4.6–10.2)

## 2014-07-19 MED ORDER — OLMESARTAN MEDOXOMIL-HCTZ 40-12.5 MG PO TABS: 1.0000 | ORAL_TABLET | Freq: Every day | ORAL | Status: DC

## 2014-07-19 MED ORDER — AMLODIPINE BESYLATE 5 MG PO TABS: ORAL_TABLET | ORAL | Status: DC

## 2014-07-19 NOTE — Patient Instructions (Addendum)
Medicare Annual Wellness Visit  [facility] and the medical providers at [facility] strive to bring you the best medical care.  In doing so we not only want to address your current medical conditions and concerns but also to detect new conditions early and prevent illness, disease and health-related problems.    Medicare offers a yearly Wellness Visit which allows our clinical staff to assess your need for preventative services including immunizations, lifestyle education, counseling to decrease risk of preventable diseases and screening for fall risk and other medical concerns.    This visit is provided free of charge (no copay) for all Medicare recipients. The clinical pharmacists at [facility] have begun to conduct these Wellness Visits which will also include a thorough review of all your medications.    As you primary medical provider recommend that you make an appointment for your Annual Wellness Visit if you have not done so already this year.  You may set up this appointment before you leave today or you may call back ([phone]) and schedule an appointment.  Please make sure when you call that you mention that you are scheduling your Annual Wellness Visit with the clinical pharmacist so that the appointment may be made for the proper length of time.      Continue current medications. Continue good therapeutic lifestyle changes which include good diet and exercise. Fall precautions discussed with patient. If an FOBT was given today- please return it to our front desk. If you are over 5 years old - you may need Prevnar 14 or the adult Pneumonia vaccine.  Flu Shots will be available at our office starting mid- September. Please call and schedule a FLU CLINIC APPOINTMENT.   We will appointment for you in November to do a PSA and rectal exam Please call the gastroenterologist and arrange for an appointment to see her about  your abdominal pain

## 2014-07-19 NOTE — Progress Notes (Signed)
Subjective:    Patient ID: Luis Deleon, male    DOB: [birth year], 73 y.o.   MRN: [id number]  HPI Pt here for follow up and management of chronic medical problems. The patient's significant history is that he's had more problems with his Crohn's disease and he is being followed more closely by the gastroenterologist for this. He does request refills on Benicar and amlodipine. He needs an FOBT lab work and a flu shot today.        Patient Active Problem List   Diagnosis Date Noted  . Metabolic syndrome 17/00/1749  . DOE (dyspnea on exertion) 08/11/2013  . 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 POLYCYTHEMIA 11/03/2008  . LIVER FUNCTION TESTS, ABNORMAL, HX OF 09/24/2008  . Hyperlipidemia 07/28/2008  . GOUT, UNSPECIFIED 07/28/2008  . HTN (hypertension) 07/28/2008  . Regional enteritis 07/28/2008  . DIVERTICULOSIS, COLON 07/28/2008  . PROSTATE CANCER, PERSONAL HX 07/28/2008  . SMALL BOWEL OBSTRUCTION, HX OF 07/28/2008  . NEPHROLITHIASIS, HX OF 07/28/2008   Outpatient Encounter Prescriptions as of 07/19/2014  Medication Sig  . allopurinol (ZYLOPRIM) 100 MG tablet TAKE 2 TABLETS DAILY AS DIRECTED  . aspirin 81 MG EC tablet Take 81 mg by mouth daily.    Marland Kitchen atorvastatin (LIPITOR) 40 MG tablet TAKE 1 TABLET EVERY DAY AS DIRECTED  . Cholecalciferol (VITAMIN D3) 1000 UNITS CAPS Take 1 capsule by mouth daily.    . colchicine (COLCRYS) 0.6 MG tablet Take 1 tablet (0.6 mg total) by mouth daily.  Marland Kitchen gemfibrozil (LOPID) 600 MG tablet TAKE 1 TABLET BY MOUTH EVERY DAY AS DIRECTED  . KLOR-CON M20 20 MEQ tablet TAKE 1 TAB TWICE DAILY ON MON, WED AND FRIDAY-DAILY EVERY OTHER DAY  . olmesartan-hydrochlorothiazide (BENICAR HCT) 40-12.5 MG per tablet Take 1 tablet by mouth daily. As directed  . OMEGA-3 1000 MG CAPS Take 2 capsules by mouth 2 (two) times daily.  . [DISCONTINUED] gemfibrozil (LOPID) 600 MG tablet Take 600 mg by mouth 3 (three) times a week.   . [DISCONTINUED]  olmesartan-hydrochlorothiazide (BENICAR HCT) 40-12.5 MG per tablet TAKE HALF TABLET BY MOUTH ONCE A DAY  . amLODipine (NORVASC) 5 MG tablet TAKE 2 TABLETS BY MOUTH DAILY AS DIRECTED    Review of Systems  Constitutional: Negative.   HENT: Negative.   Eyes: Negative.   Respiratory: Negative.   Cardiovascular: Negative.   Gastrointestinal: Positive for abdominal pain (more frequent pains- Dr Olevia Perches).  Endocrine: Negative.   Genitourinary: Negative.   Musculoskeletal: Negative.   Skin: Negative.   Allergic/Immunologic: Negative.   Neurological: Negative.   Hematological: Negative.   Psychiatric/Behavioral: Negative.        Objective:   Physical Exam  Nursing note and vitals reviewed. Constitutional: He is oriented to person, place, and time. He appears well-developed and well-nourished. No distress.  The patient is alert and cooperative.  HENT:  Head: Normocephalic and atraumatic.  Right Ear: External ear normal.  Left Ear: External ear normal.  Mouth/Throat: Oropharynx is clear and moist. No oropharyngeal exudate.  There is nasal congestion bilateral  Eyes: Conjunctivae and EOM are normal. Pupils are equal, round, and reactive to light. Right eye exhibits no discharge. Left eye exhibits no discharge. No scleral icterus.  Neck: Normal range of motion. Neck supple. No thyromegaly present.  No carotid bruits or thyromegaly  Cardiovascular: Normal rate, regular rhythm, normal heart sounds and intact distal pulses.  Exam reveals no gallop and no friction rub.   No murmur heard. The heart has a regular rate  and rhythm at 72 per min  Pulmonary/Chest: Effort normal and breath sounds normal. No respiratory distress. He has no wheezes. He has no rales. He exhibits no tenderness.  Abdominal: Soft. Bowel sounds are normal. He exhibits no mass. There is tenderness. There is no rebound and no guarding.  There is slight epigastric tenderness, bowel sounds are normal, and there are no abdominal  bruits  Musculoskeletal: Normal range of motion. He exhibits no edema and no tenderness.  Lymphadenopathy:    He has no cervical adenopathy.  Neurological: He is alert and oriented to person, place, and time. He has normal reflexes. No cranial nerve deficit.  Skin: Skin is warm and dry. No rash noted. No erythema. No pallor.  Psychiatric: He has a normal mood and affect. His behavior is normal. Judgment and thought content normal.   BP 142/82  Pulse 73  Temp(Src) 97.9 F (36.6 C) (Oral)  Ht 5' 9"  (1.753 m)  Wt 237 lb (107.502 kg)  BMI 34.98 kg/m2        Assessment & Plan:  1. Regional enteritis, unspecified complication - POCT CBC  2. Hyperlipidemia - POCT CBC - NMR, lipoprofile  3. Essential hypertension - POCT CBC - BMP8+EGFR - Hepatic function panel  4. Metabolic syndrome - POCT CBC  5. Vitamin D deficiency - Vit D  25 hydroxy (rtn osteoporosis monitoring)  6. History of prostate cancer -Rectal exam is due in November  7. Abdominal pain, chronic, epigastric -Patient will make an appointment to see the gastroenterologist   Meds ordered this encounter  Medications  . DISCONTD: olmesartan-hydrochlorothiazide (BENICAR HCT) 40-12.5 MG per tablet    Sig: Take 1 tablet by mouth daily. As directed  . olmesartan-hydrochlorothiazide (BENICAR HCT) 40-12.5 MG per tablet    Sig: Take 1 tablet by mouth daily. As directed    Dispense:  30 tablet    Refill:  5  . amLODipine (NORVASC) 5 MG tablet    Sig: TAKE 2 TABLETS BY MOUTH DAILY AS DIRECTED    Dispense:  60 tablet    Refill:  5   Patient Instructions                       Medicare Annual Wellness Visit  [facility] and the medical providers at [facility] strive to bring you the best medical care.  In doing so we not only want to address your current medical conditions and concerns but also to detect new conditions early and prevent illness, disease and health-related problems.    Medicare  offers a yearly Wellness Visit which allows our clinical staff to assess your need for preventative services including immunizations, lifestyle education, counseling to decrease risk of preventable diseases and screening for fall risk and other medical concerns.    This visit is provided free of charge (no copay) for all Medicare recipients. The clinical pharmacists at [facility] have begun to conduct these Wellness Visits which will also include a thorough review of all your medications.    As you primary medical provider recommend that you make an appointment for your Annual Wellness Visit if you have not done so already this year.  You may set up this appointment before you leave today or you may call back ([phone]) and schedule an appointment.  Please make sure when you call that you mention that you are scheduling your Annual Wellness Visit with the clinical pharmacist so that the appointment may be made for  the proper length of time.      Continue current medications. Continue good therapeutic lifestyle changes which include good diet and exercise. Fall precautions discussed with patient. If an FOBT was given today- please return it to our front desk. If you are over 49 years old - you may need Prevnar 88 or the adult Pneumonia vaccine.  Flu Shots will be available at our office starting mid- September. Please call and schedule a FLU CLINIC APPOINTMENT.   We will appointment for you in November to do a PSA and rectal exam Please call the gastroenterologist and arrange for an appointment to see her about your abdominal pain   Arrie Senate MD

## 2014-07-20 LAB — HEPATIC FUNCTION PANEL
ALT: 28 IU/L (ref 0–44)
AST: 31 IU/L (ref 0–40)
Albumin: 4 g/dL (ref 3.5–4.8)
Alkaline Phosphatase: 62 IU/L (ref 39–117)
BILIRUBIN DIRECT: 0.24 mg/dL (ref 0.00–0.40)
Total Bilirubin: 1 mg/dL (ref 0.0–1.2)
Total Protein: 6.3 g/dL (ref 6.0–8.5)

## 2014-07-20 LAB — BMP8+EGFR
BUN / CREAT RATIO: 10 (ref 10–22)
BUN: 14 mg/dL (ref 8–27)
CO2: 24 mmol/L (ref 18–29)
Calcium: 9.3 mg/dL (ref 8.6–10.2)
Chloride: 99 mmol/L (ref 97–108)
Creatinine, Ser: 1.44 mg/dL — ABNORMAL HIGH (ref 0.76–1.27)
GFR calc Af Amer: 56 mL/min/{1.73_m2} — ABNORMAL LOW (ref >59)
GFR, EST NON AFRICAN AMERICAN: 49 mL/min/{1.73_m2} — AB (ref >59)
Glucose: 90 mg/dL (ref 65–99)
Potassium: 3.5 mmol/L (ref 3.5–5.2)
SODIUM: 139 mmol/L (ref 134–144)

## 2014-07-20 LAB — NMR, LIPOPROFILE
Cholesterol: 132 mg/dL (ref 100–199)
HDL Cholesterol by NMR: 33 mg/dL — ABNORMAL LOW (ref >39)
HDL Particle Number: 27.9 umol/L — ABNORMAL LOW (ref >=30.5)
LDL Particle Number: 864 nmol/L (ref <1000)
LDL SIZE: 20.1 nm (ref >20.5)
LDLC SERPL CALC-MCNC: 62 mg/dL (ref 0–99)
LP-IR SCORE: 65 — AB (ref <=45)
SMALL LDL PARTICLE NUMBER: 531 nmol/L — AB (ref <=527)
Triglycerides by NMR: 186 mg/dL — ABNORMAL HIGH (ref 0–149)

## 2014-07-20 LAB — VITAMIN D 25 HYDROXY (VIT D DEFICIENCY, FRACTURES): VIT D 25 HYDROXY: 29.8 ng/mL — AB (ref 30.0–100.0)

## 2014-08-04 ENCOUNTER — Other Ambulatory Visit: Payer: Medicare Other

## 2014-08-04 DIAGNOSIS — Z1212 Encounter for screening for malignant neoplasm of rectum: Secondary | ICD-10-CM

## 2014-08-05 LAB — FECAL OCCULT BLOOD, IMMUNOCHEMICAL: FECAL OCCULT BLD: NEGATIVE

## 2014-08-27 ENCOUNTER — Telehealth: Payer: Self-pay | Admitting: Internal Medicine

## 2014-08-27 NOTE — Telephone Encounter (Signed)
Patient calling to report he had an "attack" last night. States is was not as bad as he has had in the past. He had abdominal pain that is wave like cramping. He took a Dilaudid tablet and today he is feeling fine. He wants to schedule an OV with Dr. Olevia Perches since he has not seen her in several years. Offered OV with extender if he needs an urgent OV but patient declines. Scheduled with Dr. Olevia Perches on 09/28/14 at 1:45 PM. He understands to call us if he needs an earlier OV due to recurring symptoms.

## 2014-08-29 ENCOUNTER — Other Ambulatory Visit: Payer: Self-pay | Admitting: Family Medicine

## 2014-09-07 ENCOUNTER — Encounter: Payer: Self-pay | Admitting: *Deleted

## 2014-09-20 ENCOUNTER — Encounter: Payer: Self-pay | Admitting: Family Medicine

## 2014-09-20 ENCOUNTER — Ambulatory Visit (INDEPENDENT_AMBULATORY_CARE_PROVIDER_SITE_OTHER): Payer: Medicare Other | Admitting: Family Medicine

## 2014-09-20 VITALS — BP 145/76 | HR 90 | Temp 97.7°F | Ht 69.0 in | Wt 237.8 lb

## 2014-09-20 DIAGNOSIS — Z9079 Acquired absence of other genital organ(s): Secondary | ICD-10-CM

## 2014-09-20 DIAGNOSIS — Z9889 Other specified postprocedural states: Secondary | ICD-10-CM

## 2014-09-20 DIAGNOSIS — N400 Enlarged prostate without lower urinary tract symptoms: Secondary | ICD-10-CM

## 2014-09-20 DIAGNOSIS — N39498 Other specified urinary incontinence: Secondary | ICD-10-CM

## 2014-09-20 NOTE — Patient Instructions (Signed)
Continue current medications continue to drink plenty of fluids Continue follow-up with specialist as planned

## 2014-09-20 NOTE — Progress Notes (Signed)
   Subjective:    Patient ID: Luis Deleon, male    DOB: [birth year], 73 y.o.   MRN: [id number]  HPI Pt is here today for a prostate check and PSA. The patient has a history of a prostatectomy 8 or 9 years ago and was released by the urologist for [place] to just check his PSA yearly. He is having no problems except occasional incontinence mostly when he is tired.        Review of Systems  Constitutional: Negative.   HENT: Negative.   Eyes: Negative.   Respiratory: Negative.   Cardiovascular: Negative.   Gastrointestinal: Negative.   Endocrine: Negative.   Genitourinary: Negative.   Neurological: Negative.   Psychiatric/Behavioral: Negative.    Patient Active Problem List   Diagnosis Date Noted  . Metabolic syndrome 62/95/2841  . DOE (dyspnea on exertion) 08/11/2013  . 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 POLYCYTHEMIA 11/03/2008  . LIVER FUNCTION TESTS, ABNORMAL, HX OF 09/24/2008  . Hyperlipidemia 07/28/2008  . GOUT, UNSPECIFIED 07/28/2008  . HTN (hypertension) 07/28/2008  . Regional enteritis 07/28/2008  . DIVERTICULOSIS, COLON 07/28/2008  . PROSTATE CANCER, PERSONAL HX 07/28/2008  . SMALL BOWEL OBSTRUCTION, HX OF 07/28/2008  . NEPHROLITHIASIS, HX OF 07/28/2008   Outpatient Encounter Prescriptions as of 09/20/2014  Medication Sig  . allopurinol (ZYLOPRIM) 100 MG tablet TAKE 2 TABLETS DAILY AS DIRECTED  . amLODipine (NORVASC) 5 MG tablet TAKE 2 TABLETS BY MOUTH DAILY AS DIRECTED  . aspirin 81 MG EC tablet Take 81 mg by mouth daily.    Marland Kitchen atorvastatin (LIPITOR) 40 MG tablet TAKE 1 TABLET EVERY DAY AS DIRECTED  . Cholecalciferol (VITAMIN D3) 1000 UNITS CAPS Take 1 capsule by mouth daily.    . colchicine (COLCRYS) 0.6 MG tablet Take 1 tablet (0.6 mg total) by mouth daily.  Marland Kitchen gemfibrozil (LOPID) 600 MG tablet TAKE 1 TABLET BY MOUTH EVERY DAY AS DIRECTED  . KLOR-CON M20 20 MEQ tablet TAKE 1 TAB TWICE DAILY ON MON, WED AND FRIDAY-DAILY EVERY OTHER DAY  .  olmesartan-hydrochlorothiazide (BENICAR HCT) 40-12.5 MG per tablet Take 1 tablet by mouth daily. As directed  . OMEGA-3 1000 MG CAPS Take 2 capsules by mouth 2 (two) times daily.      Objective:   Physical Exam  Constitutional: He is oriented to person, place, and time. He appears well-developed and well-nourished. No distress.  Genitourinary: Rectum normal and penis normal.  The external genitalia were normal and there was no hernia. The epididymis is slightly prominent on the left side. The rectal exam was negative for rectal masses and the prostate vault was felt without any lumps or masses.  Musculoskeletal: Normal range of motion.  Neurological: He is alert and oriented to person, place, and time.  Psychiatric: He has a normal mood and affect. His behavior is normal. Thought content normal.  Nursing note and vitals reviewed.  BP 145/76 mmHg  Pulse 90  Temp(Src) 97.7 F (36.5 C) (Oral)  Ht 5' 9"  (1.753 m)  Wt 237 lb 12.8 oz (107.865 kg)  BMI 35.10 kg/m2        Assessment & Plan:  1. Prostate cancer - PSA, total and free  2. H/O prostatectomy  3. Other urinary incontinence Patient Instructions  Continue current medications continue to drink plenty of fluids Continue follow-up with specialist as planned    Arrie Senate MD

## 2014-09-21 LAB — PSA, TOTAL AND FREE
PSA, Free: 0.01 ng/mL
PSA: <0.1 ng/mL (ref 0.0–4.0)

## 2014-09-28 ENCOUNTER — Ambulatory Visit: Payer: Medicare Other | Admitting: Internal Medicine

## 2014-10-01 ENCOUNTER — Ambulatory Visit (INDEPENDENT_AMBULATORY_CARE_PROVIDER_SITE_OTHER): Payer: Medicare Other | Admitting: Internal Medicine

## 2014-10-01 ENCOUNTER — Encounter: Payer: Self-pay | Admitting: Internal Medicine

## 2014-10-01 VITALS — BP 130/76 | HR 76 | Ht 69.0 in | Wt 234.0 lb

## 2014-10-01 DIAGNOSIS — K50919 Crohn's disease, unspecified, with unspecified complications: Secondary | ICD-10-CM

## 2014-10-01 DIAGNOSIS — R109 Unspecified abdominal pain: Secondary | ICD-10-CM

## 2014-10-01 MED ORDER — HYDROMORPHONE HCL 2 MG PO TABS: ORAL_TABLET | ORAL | Status: DC

## 2014-10-01 MED ORDER — METRONIDAZOLE 250 MG PO TABS: 250.0000 mg | ORAL_TABLET | Freq: Three times a day (TID) | ORAL | Status: DC

## 2014-10-01 NOTE — Patient Instructions (Addendum)
You have been scheduled for a small bowel capsule endoscopy. Please follow written instructions given to you at your office visit today.  We have given you a prescription for Dilaudid to take to your pharmacy.  We have sent the following medications to your pharmacy for you to pick up at your convenience: Flagyl   CC:Dr Redge Gainer.

## 2014-10-01 NOTE — Progress Notes (Signed)
Luis Deleon 11/23/1940 [id number]  Note: This dictation was prepared with Dragon digital system. Any transcriptional errors that result from this procedure are unintentional.   History of Present Illness:  This is a 73 year old white male with Crohn's colitis since 1984. His last office visit  was in  March 2013. Pt underwent  terminal resection in 2001. He also has a history of prostate cancer, polycythemia and multinodular goiter. He had repair of a ventral hernia with mesh in 2003 and again in 2011. A prior colonoscopy in December 2009 showed active chronic ileitis. His last colonoscopy in June 2012 did not show any evidence of active Crohn's disease. He had a hyperplastic polyp removed at that time.. Patient was on 6-mercaptopurine until recently but this was d/c'ed  because of gout treatment ( allopurinol.) . He has mild chronic renal insufficiency. He has episodes of severe abdominal pain lasting 8-10 hours suggestive of a small bowel obstruction, last episode about a month ago.CT enterography in March 2013 showed no evidence of small bowel obstruction.     Past Medical History  Diagnosis Date  . Prostate cancer   . Hyperlipemia   . Hypertension   . Diverticulosis   . Gout   . Small bowel obstruction   . Crohn disease   . Polycythemia   . Abnormal LFTs (liver function tests)   . Arthritis   . Hyperplastic colon polyp 2012  . Nephrolithiasis   . Hepatic cyst     Past Surgical History  Procedure Laterality Date  . Tonsillectomy    . Ventral hernia repair      2 times  . Lithotripsy    . Exploratory laparotomy w/ bowel resection    . Prostatectomy    . Appendectomy      Allergies  Allergen Reactions  . Colchicine     REACTION: nausea, diarrhea and interactions with 4m    Family history and social history have been reviewed.  Review of Systems: Denies heartburn dysphagia positive for intermittent abdominal pain  The remainder of the 10 point ROS is negative  except as outlined in the H&P  Physical Exam: General Appearance Well developed, in no distress, overweight Eyes  Non icteric  HEENT  Non traumatic, normocephalic  Mouth No lesion, tongue papillated, no cheilosis Neck Supple without adenopathy, thyroid not enlarged, no carotid bruits, no JVD Lungs Clear to auscultation bilaterally COR Normal S1, normal S2, regular rhythm, no murmur, quiet precordium Abdomen protuberant soft, nontender. Normoactive bowel sounds. No tympany. Healed surgical scar Rectal not done Extremities  No pedal edema Skin No lesions Neurological Alert and oriented x 3 Psychological Normal mood and affect  Assessment and Plan:   Problem #[id number] old white male with Crohn's disease of the small bowel who had a prior terminal ileum resection and is having episodic severe abdominal  months resembling small bowel obstruction, but CT enteroclysis is negative for transition point.. We will proceed with a small bowel capsule endoscopy to look for active Crohn's disease. If positive, then I would consider Entocort or biologicals. He also needs a refill on Dilaudid 2 mg which he takes very sparingly for severe abdominal pain.    DDelfin Edis12/18/2015

## 2014-10-11 ENCOUNTER — Other Ambulatory Visit: Payer: Self-pay | Admitting: Family Medicine

## 2014-10-24 ENCOUNTER — Other Ambulatory Visit: Payer: Self-pay | Admitting: Family Medicine

## 2014-11-25 ENCOUNTER — Encounter: Payer: Self-pay | Admitting: Family Medicine

## 2014-11-25 ENCOUNTER — Ambulatory Visit (INDEPENDENT_AMBULATORY_CARE_PROVIDER_SITE_OTHER): Payer: Medicare Other

## 2014-11-25 ENCOUNTER — Ambulatory Visit (INDEPENDENT_AMBULATORY_CARE_PROVIDER_SITE_OTHER): Payer: Medicare Other | Admitting: Family Medicine

## 2014-11-25 VITALS — BP 128/80 | HR 88 | Temp 97.3°F | Ht 69.0 in | Wt 241.0 lb

## 2014-11-25 DIAGNOSIS — Z9889 Other specified postprocedural states: Secondary | ICD-10-CM | POA: Diagnosis not present

## 2014-11-25 DIAGNOSIS — E559 Vitamin D deficiency, unspecified: Secondary | ICD-10-CM

## 2014-11-25 DIAGNOSIS — J302 Other seasonal allergic rhinitis: Secondary | ICD-10-CM

## 2014-11-25 DIAGNOSIS — Z9079 Acquired absence of other genital organ(s): Secondary | ICD-10-CM

## 2014-11-25 DIAGNOSIS — E785 Hyperlipidemia, unspecified: Secondary | ICD-10-CM

## 2014-11-25 DIAGNOSIS — R109 Unspecified abdominal pain: Secondary | ICD-10-CM

## 2014-11-25 DIAGNOSIS — E8881 Metabolic syndrome: Secondary | ICD-10-CM

## 2014-11-25 DIAGNOSIS — M545 Low back pain: Secondary | ICD-10-CM

## 2014-11-25 DIAGNOSIS — N400 Enlarged prostate without lower urinary tract symptoms: Secondary | ICD-10-CM

## 2014-11-25 DIAGNOSIS — I700 Atherosclerosis of aorta: Secondary | ICD-10-CM | POA: Insufficient documentation

## 2014-11-25 DIAGNOSIS — I10 Essential (primary) hypertension: Secondary | ICD-10-CM

## 2014-11-25 DIAGNOSIS — M25551 Pain in right hip: Secondary | ICD-10-CM

## 2014-11-25 DIAGNOSIS — R10A1 Flank pain, right side: Secondary | ICD-10-CM

## 2014-11-25 LAB — POCT CBC
Granulocyte percent: 65.7 %G (ref 37–80)
HEMATOCRIT: 53.9 % — AB (ref 43.5–53.7)
Hemoglobin: 16.5 g/dL (ref 14.1–18.1)
Lymph, poc: 1.9 (ref 0.6–3.4)
MCH, POC: 28.2 pg (ref 27–31.2)
MCHC: 30.6 g/dL — AB (ref 31.8–35.4)
MCV: 92.3 fL (ref 80–97)
MPV: 8.7 fL (ref 0–99.8)
PLATELET COUNT, POC: 158 10*3/uL (ref 142–424)
POC Granulocyte: 4.5 (ref 2–6.9)
POC LYMPH PERCENT: 28.5 %L (ref 10–50)
RBC: 5.8 M/uL (ref 4.69–6.13)
RDW, POC: 15.3 %
WBC: 6.8 10*3/uL (ref 4.6–10.2)

## 2014-11-25 LAB — POCT URINALYSIS DIPSTICK
Bilirubin, UA: NEGATIVE
Glucose, UA: NEGATIVE
Ketones, UA: NEGATIVE
Leukocytes, UA: NEGATIVE
Nitrite, UA: NEGATIVE
PROTEIN UA: NEGATIVE
Spec Grav, UA: 1.02
UROBILINOGEN UA: NEGATIVE
pH, UA: 6

## 2014-11-25 LAB — POCT UA - MICROSCOPIC ONLY
Bacteria, U Microscopic: NEGATIVE
CASTS, UR, LPF, POC: NEGATIVE
Crystals, Ur, HPF, POC: NEGATIVE
Epithelial cells, urine per micros: NEGATIVE
MUCUS UA: NEGATIVE
RBC, urine, microscopic: NEGATIVE
Yeast, UA: NEGATIVE

## 2014-11-25 MED ORDER — LOSARTAN POTASSIUM 100 MG PO TABS
100.0000 mg | ORAL_TABLET | Freq: Every day | ORAL | Status: DC
Start: 2014-11-25 — End: 2015-05-17

## 2014-11-25 MED ORDER — LOSARTAN POTASSIUM 100 MG PO TABS: 100.0000 mg | ORAL_TABLET | Freq: Every day | ORAL | Status: DC

## 2014-11-25 MED ORDER — HYDROCHLOROTHIAZIDE 12.5 MG PO TABS: 12.5000 mg | ORAL_TABLET | Freq: Every day | ORAL | Status: DC

## 2014-11-25 NOTE — Progress Notes (Signed)
Subjective:    Patient ID: Luis Deleon, male    DOB: 1941/01/08, 74 y.o.   MRN: [id number]  HPI Pt here for follow up and management of chronic medical problems which includes history of prostate cancer, hypertension, hyperlipidemia. He is taking medications regularly. 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 He has a flare of this with severe pain every 3-4 months and has medication that he takes from her that settles this down. He is having some problems with right flank pain and he has had a history of kidney stones in the past. He has not had any burning with passing his water or has he seen any blood in the urine. The flank pain hits him and it is worse with movement. He does have a history of prostate cancer. But this is remote. He denies chest pain or shortness of breath and he does not check his blood pressures regularly at home. He is feeling well other than this flank pain and he is doing well with his regional enteritis other than the bouts every 3 or 4 months which she is used to having. He is up-to-date on his health maintenance issues. He did take his blood pressure medicine this morning.          Patient Active Problem List   Diagnosis Date Noted  . Metabolic syndrome 27/25/3664  . DOE (dyspnea on exertion) 08/11/2013  . 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 POLYCYTHEMIA 11/03/2008  . LIVER FUNCTION TESTS, ABNORMAL, HX OF 09/24/2008  . Hyperlipidemia 07/28/2008  . GOUT, UNSPECIFIED 07/28/2008  . HTN (hypertension) 07/28/2008  . Regional enteritis 07/28/2008  . DIVERTICULOSIS, COLON 07/28/2008  . PROSTATE CANCER, PERSONAL HX 07/28/2008  . SMALL BOWEL OBSTRUCTION, HX OF 07/28/2008  . NEPHROLITHIASIS, HX OF 07/28/2008   Outpatient Encounter Prescriptions as of 11/25/2014  Medication Sig  . allopurinol (ZYLOPRIM) 100 MG tablet TAKE 2 TABLETS DAILY AS DIRECTED  . amLODipine (NORVASC) 5 MG tablet TAKE 2 TABLETS BY MOUTH DAILY  AS DIRECTED  . aspirin 81 MG EC tablet Take 81 mg by mouth daily.    Marland Kitchen atorvastatin (LIPITOR) 40 MG tablet TAKE 1 TABLET EVERY DAY AS DIRECTED  . Cholecalciferol (VITAMIN D3) 1000 UNITS CAPS Take 1 capsule by mouth daily.    Marland Kitchen gemfibrozil (LOPID) 600 MG tablet TAKE 1 TABLET BY MOUTH EVERY DAY AS DIRECTED  . KLOR-CON M20 20 MEQ tablet TAKE 1 TAB TWICE DAILY ON MON, WED AND FRIDAY-DAILY EVERY OTHER DAY  . olmesartan-hydrochlorothiazide (BENICAR HCT) 40-12.5 MG per tablet Take 1 tablet by mouth daily. As directed  . OMEGA-3 1000 MG CAPS Take 2 capsules by mouth 2 (two) times daily.  . colchicine (COLCRYS) 0.6 MG tablet Take 1 tablet (0.6 mg total) by mouth daily. (Patient not taking: Reported on 11/25/2014)  . [DISCONTINUED] HYDROmorphone (DILAUDID) 2 MG tablet Take 1 tablet by mouth every 6-8 hours as needed for severe abdominal pain  . [DISCONTINUED] metroNIDAZOLE (FLAGYL) 250 MG tablet Take 1 tablet (250 mg total) by mouth 3 (three) times daily.    Review of Systems  Constitutional: Negative.   HENT: Negative.   Eyes: Negative.   Respiratory: Negative.   Cardiovascular: Negative.   Gastrointestinal: Negative.   Endocrine: Negative.   Genitourinary: Positive for flank pain (right side flank pain- has an occasional pain).  Musculoskeletal: Negative.   Skin: Negative.   Allergic/Immunologic: Negative.   Neurological: Negative.   Hematological: Negative.   Psychiatric/Behavioral: Negative.  Objective:   Physical Exam  Constitutional: He is oriented to person, place, and time. He appears well-developed and well-nourished. No distress.  The patient is pleasant and alert  HENT:  Head: Normocephalic and atraumatic.  Right Ear: External ear normal.  Left Ear: External ear normal.  Mouth/Throat: Oropharynx is clear and moist. No oropharyngeal exudate.  Nasal congestion bilaterally  Eyes: Conjunctivae and EOM are normal. Pupils are equal, round, and reactive to light. Right eye  exhibits no discharge. Left eye exhibits no discharge. No scleral icterus.  Neck: Normal range of motion. Neck supple. No thyromegaly present.  There is no anterior cervical adenopathy and no carotid bruits were audible  Cardiovascular: Normal rate, regular rhythm, normal heart sounds and intact distal pulses.  Exam reveals no gallop and no friction rub.   No murmur heard. The heart has a regular rate and rhythm at 96/m  Pulmonary/Chest: Effort normal and breath sounds normal. No respiratory distress. He has no wheezes. He has no rales. He exhibits no tenderness.  There is no axillary adenopathy. The chest is clear anteriorly and posteriorly  Abdominal: Soft. Bowel sounds are normal. He exhibits no mass. There is no tenderness. There is no rebound and no guarding.  There was no abdominal tenderness, bruits or masses. There was no inguinal adenopathy.  Musculoskeletal: Normal range of motion. He exhibits no edema or tenderness.  There was no obvious discomfort with laying on the table or sitting back up and leg movements appeared to be good without pain.  Lymphadenopathy:    He has no cervical adenopathy.  Neurological: He is alert and oriented to person, place, and time. He has normal reflexes. No cranial nerve deficit.  Skin: Skin is warm and dry. No rash noted. No erythema. No pallor.  Psychiatric: He has a normal mood and affect. His behavior is normal. Judgment and thought content normal.  Nursing note and vitals reviewed.  BP 146/91 mmHg  Pulse 88  Temp(Src) 97.3 F (36.3 C) (Oral)  Ht 5' 9" (1.753 m)  Wt 241 lb (109.317 kg)  BMI 35.57 kg/m2  Repeat blood pressure left arm 128/80   [facility] reading (PRIMARY) by  Dr. Governor Rooks hip and LS spine--- degenerative changes of LS spine and both hips                                 Results for orders placed or performed in visit on 11/25/14  POCT UA - Microscopic Only  Result Value Ref Range   WBC, Ur, HPF, POC 1-3    RBC, urine,  microscopic neg    Bacteria, U Microscopic neg    Mucus, UA neg    Epithelial cells, urine per micros neg    Crystals, Ur, HPF, POC neg    Casts, Ur, LPF, POC neg    Yeast, UA neg   POCT urinalysis dipstick  Result Value Ref Range   Color, UA yellow    Clarity, UA clear    Glucose, UA neg    Bilirubin, UA neg    Ketones, UA neg    Spec Grav, UA 1.020    Blood, UA small    pH, UA 6.0    Protein, UA neg    Urobilinogen, UA negative    Nitrite, UA neg    Leukocytes, UA Negative   POCT CBC  Result Value Ref Range   WBC 6.8 4.6 - 10.2 K/uL   Lymph,  poc 1.9 0.6 - 3.4   POC LYMPH PERCENT 28.5 10 - 50 %L   POC Granulocyte 4.5 2 - 6.9   Granulocyte percent 65.7 37 - 80 %G   RBC 5.8 4.69 - 6.13 M/uL   Hemoglobin 16.5 14.1 - 18.1 g/dL   HCT, POC 53.9 (A) 43.5 - 53.7 %   MCV 92.3 80 - 97 fL   MCH, POC 28.2 27 - 31.2 pg   MCHC 30.6 (A) 31.8 - 35.4 g/dL   RDW, POC 15.3 %   Platelet Count, POC 158.0 142 - 424 K/uL   MPV 8.7 0 - 99.8 fL        Assessment & Plan:  1. Prostate cancer -We will continue to monitor this with rectal exams at the appropriate time. - POCT UA - Microscopic Only - POCT urinalysis dipstick - Urine culture - POCT CBC  2. H/O prostatectomy -Rectal exams yearly - POCT UA - Microscopic Only - POCT urinalysis dipstick - Urine culture - POCT CBC  3. Hyperlipidemia -For now continue current treatment contingent upon blood work to be reviewed - POCT CBC - NMR, lipoprofile  4. Essential hypertension -Continue current treatment, when this treatment is completed we will change to losartan 100 and HCTZ 12.5 because of cost - POCT CBC - BMP8+EGFR - Hepatic function panel  5. Metabolic syndrome -Continue with aggressive therapeutic lifestyle changes which include diet and exercise - POCT CBC - BMP8+EGFR  6. Vitamin D deficiency -Continue with current treatment contingent upon lab work results her vitamin D today. - POCT CBC - Vit D  25 hydroxy (rtn  osteoporosis monitoring)  7. Right flank pain - POCT UA - Microscopic Only - POCT urinalysis dipstick - Urine culture - POCT CBC - DG Lumbar Spine 2-3 Views; Future - DG HIP UNILAT WITH PELVIS 2-3 VIEWS RIGHT; Future  8. Right hip pain - DG Lumbar Spine 2-3 Views; Future - DG HIP UNILAT WITH PELVIS 2-3 VIEWS RIGHT; Future  9. Low back pain, unspecified back pain laterality, with sciatica presence unspecified - DG Lumbar Spine 2-3 Views; Future - DG HIP UNILAT WITH PELVIS 2-3 VIEWS RIGHT; Future  10. Other seasonal allergic rhinitis -Nasal saline -Good pulmonary hygiene  Meds ordered this encounter  Medications  . losartan (COZAAR) 100 MG tablet    Sig: Take 1 tablet (100 mg total) by mouth daily. As directed    Dispense:  90 tablet    Refill:  3  . hydrochlorothiazide (HYDRODIURIL) 12.5 MG tablet    Sig: Take 1 tablet (12.5 mg total) by mouth daily. As directed    Dispense:  90 tablet    Refill:  3   Patient Instructions                       Medicare Annual Wellness Visit  [facility] and the medical providers at [facility] strive to bring you the best medical care.  In doing so we not only want to address your current medical conditions and concerns but also to detect new conditions early and prevent illness, disease and health-related problems.    Medicare offers a yearly Wellness Visit which allows our clinical staff to assess your need for preventative services including immunizations, lifestyle education, counseling to decrease risk of preventable diseases and screening for fall risk and other medical concerns.    This visit is provided free of charge (no copay) for all Medicare recipients. The clinical pharmacists at [facility] have begun to conduct these Wellness Visits which will also include a thorough review of all your medications.    As you primary medical provider recommend that you make an appointment for your  Annual Wellness Visit if you have not done so already this year.  You may set up this appointment before you leave today or you may call back ([phone]) and schedule an appointment.  Please make sure when you call that you mention that you are scheduling your Annual Wellness Visit with the clinical pharmacist so that the appointment may be made for the proper length of time.     Continue current medications. Continue good therapeutic lifestyle changes which include good diet and exercise. Fall precautions discussed with patient. If an FOBT was given today- please return it to our front desk. If you are over 52 years old - you may need Prevnar 9 or the adult Pneumonia vaccine.  Flu Shots are still available at our office. If you still haven't had one please call to set up a nurse visit to get one.   After your visit with [place] today you will receive a survey in the mail or online from [employer] regarding your care with [place]. Please take a moment to fill this out. Your feedback is very important to [place] as you can help [place] better understand your patient needs as well as improve your experience and satisfaction. WE CARE ABOUT YOU!!!   We will call you with the results of the x-rays of your back and right hip. You should watch your sodium intake closely and try to monitor your blood pressures more regularly at home Practice good pulmonary hygiene, use nasal saline for congestion and keep the house as cool as possible and drink as many fluids as possible and also use Mucinex if needed for cough and congestion When you finish your Benicar HCT, you can start the losartan 100 and the HCTZ 12.5 and he would take a half of each one of these pills. Once again please monitor your blood pressure before you go on this new medicine and after you go on this new medicine and bring those readings in when you come to see [place] to the next visit.   Arrie Senate MD

## 2014-11-25 NOTE — Patient Instructions (Addendum)
Medicare Annual Wellness Visit  [facility] and the medical providers at [facility] strive to bring you the best medical care.  In doing so we not only want to address your current medical conditions and concerns but also to detect new conditions early and prevent illness, disease and health-related problems.    Medicare offers a yearly Wellness Visit which allows our clinical staff to assess your need for preventative services including immunizations, lifestyle education, counseling to decrease risk of preventable diseases and screening for fall risk and other medical concerns.    This visit is provided free of charge (no copay) for all Medicare recipients. The clinical pharmacists at [facility] have begun to conduct these Wellness Visits which will also include a thorough review of all your medications.    As you primary medical provider recommend that you make an appointment for your Annual Wellness Visit if you have not done so already this year.  You may set up this appointment before you leave today or you may call back ([phone]) and schedule an appointment.  Please make sure when you call that you mention that you are scheduling your Annual Wellness Visit with the clinical pharmacist so that the appointment may be made for the proper length of time.     Continue current medications. Continue good therapeutic lifestyle changes which include good diet and exercise. Fall precautions discussed with patient. If an FOBT was given today- please return it to our front desk. If you are over 56 years old - you may need Prevnar 62 or the adult Pneumonia vaccine.  Flu Shots are still available at our office. If you still haven't had one please call to set up a nurse visit to get one.   After your visit with [place] today you will receive a survey in the mail or online from [employer] regarding your care with [place]. Please take a moment to  fill this out. Your feedback is very important to [place] as you can help [place] better understand your patient needs as well as improve your experience and satisfaction. WE CARE ABOUT YOU!!!   We will call you with the results of the x-rays of your back and right hip. You should watch your sodium intake closely and try to monitor your blood pressures more regularly at home Practice good pulmonary hygiene, use nasal saline for congestion and keep the house as cool as possible and drink as many fluids as possible and also use Mucinex if needed for cough and congestion When you finish your Benicar HCT, you can start the losartan 100 and the HCTZ 12.5 and he would take a half of each one of these pills. Once again please monitor your blood pressure before you go on this new medicine and after you go on this new medicine and bring those readings in when you come to see [place] to the next visit.

## 2014-11-26 LAB — NMR, LIPOPROFILE
Cholesterol: 136 mg/dL (ref 100–199)
HDL Cholesterol by NMR: 37 mg/dL — ABNORMAL LOW (ref >39)
HDL PARTICLE NUMBER: 29.7 umol/L — AB (ref >=30.5)
LDL PARTICLE NUMBER: 932 nmol/L (ref <1000)
LDL SIZE: 20.5 nm (ref >20.5)
LDL-C: 70 mg/dL (ref 0–99)
LP-IR SCORE: 62 — AB (ref <=45)
SMALL LDL PARTICLE NUMBER: 459 nmol/L (ref <=527)
Triglycerides by NMR: 144 mg/dL (ref 0–149)

## 2014-11-26 LAB — BMP8+EGFR
BUN / CREAT RATIO: 11 (ref 10–22)
BUN: 14 mg/dL (ref 8–27)
CHLORIDE: 102 mmol/L (ref 97–108)
CO2: 24 mmol/L (ref 18–29)
Calcium: 9.2 mg/dL (ref 8.6–10.2)
Creatinine, Ser: 1.32 mg/dL — ABNORMAL HIGH (ref 0.76–1.27)
GFR calc Af Amer: 62 mL/min/{1.73_m2} (ref >59)
GFR calc non Af Amer: 54 mL/min/{1.73_m2} — ABNORMAL LOW (ref >59)
Glucose: 91 mg/dL (ref 65–99)
Potassium: 4 mmol/L (ref 3.5–5.2)
SODIUM: 142 mmol/L (ref 134–144)

## 2014-11-26 LAB — VITAMIN D 25 HYDROXY (VIT D DEFICIENCY, FRACTURES): Vit D, 25-Hydroxy: 33.8 ng/mL (ref 30.0–100.0)

## 2014-11-26 LAB — HEPATIC FUNCTION PANEL
ALBUMIN: 3.8 g/dL (ref 3.5–4.8)
ALT: 25 IU/L (ref 0–44)
AST: 29 IU/L (ref 0–40)
Alkaline Phosphatase: 65 IU/L (ref 39–117)
BILIRUBIN, DIRECT: 0.2 mg/dL (ref 0.00–0.40)
Bilirubin Total: 0.9 mg/dL (ref 0.0–1.2)
Total Protein: 6.4 g/dL (ref 6.0–8.5)

## 2014-11-29 ENCOUNTER — Telehealth: Payer: Self-pay | Admitting: *Deleted

## 2014-11-29 NOTE — Telephone Encounter (Signed)
-----   Message from Chipper Herb, MD sent at 11/26/2014  9:43 PM EST ----- The blood sugar is good at 91. The creatinine, the most important kidney function test remains slightly elevated but less so than 4 months ago and this is good. It is now 1.32. The electrolytes including potassium are within normal limits. All liver function tests are within normal limits. Cholesterol numbers with advanced lipid testing have a total LDL particle number at 932. The LDL C is good at 70. The LDL particle number by the way is good and at goal of less than 1000. The triglycerides are good at 144 and this is improved from the last check whenever 186.------ the patient should continue with his current cholesterol treatment of atorvastatin and Lopid since he has been on this for a good while. The vitamin D level is good at 33.8 and he should continue with his current treatment

## 2014-11-29 NOTE — Telephone Encounter (Signed)
Pt notified of results Verbalizes understanding 

## 2014-11-29 NOTE — Telephone Encounter (Signed)
Labs- [email or web address]

## 2015-01-13 ENCOUNTER — Other Ambulatory Visit: Payer: Self-pay | Admitting: Family Medicine

## 2015-01-16 ENCOUNTER — Other Ambulatory Visit: Payer: Self-pay | Admitting: Family Medicine

## 2015-01-21 ENCOUNTER — Telehealth: Payer: Self-pay | Admitting: Family Medicine

## 2015-01-21 NOTE — Telephone Encounter (Signed)
HCTZ came in capsule form instead of tablet so he isn't able to take 1/2 like instructed. Dosage is 12.24m capsule. Advised that he can take a whole capsule and to monitor blood pressure. He will take 1/2 tablet of Losartan 1071mas prescribed.

## 2015-01-21 NOTE — Telephone Encounter (Signed)
Please call the patient in HCTZ 25 mg tablets one daily as directed and not capsules. That when he can take a half a tablet a day.

## 2015-01-26 ENCOUNTER — Ambulatory Visit: Payer: Medicare Other | Admitting: *Deleted

## 2015-02-02 ENCOUNTER — Encounter: Payer: Self-pay | Admitting: *Deleted

## 2015-02-02 ENCOUNTER — Ambulatory Visit (INDEPENDENT_AMBULATORY_CARE_PROVIDER_SITE_OTHER): Payer: Medicare Other | Admitting: *Deleted

## 2015-02-02 ENCOUNTER — Encounter (INDEPENDENT_AMBULATORY_CARE_PROVIDER_SITE_OTHER): Payer: Self-pay

## 2015-02-02 VITALS — BP 121/73 | HR 99 | Resp 20 | Ht 68.5 in | Wt 230.4 lb

## 2015-02-02 DIAGNOSIS — Z0000 Encounter for general adult medical examination without abnormal findings: Secondary | ICD-10-CM | POA: Diagnosis not present

## 2015-02-02 NOTE — Patient Instructions (Signed)
Advance Directive Advance directives are the legal documents that allow you to make choices about your health care and medical treatment if you cannot speak for yourself. Advance directives are a way for you to communicate your wishes to family, friends, and health care providers. The specified people can then convey your decisions about end-of-life care to avoid confusion if you should become unable to communicate. Ideally, the process of discussing and writing advance directives should happen over time rather than making decisions all at once. Advance directives can be modified as your situation changes, and you can change your mind at any time, even after you have signed the advance directives. Each state has its own laws regarding advance directives. You may want to check with your health care provider, attorney, or state representative about the law in your state. Below are some examples of advance directives. LIVING WILL A living will is a set of instructions documenting your wishes about medical care when you cannot care for yourself. It is used if you become:  Terminally ill.  Incapacitated.  Unable to communicate.  Unable to make decisions. Items to consider in your living will include:  The use or non-use of life-sustaining equipment, such as dialysis machines and breathing machines (ventilators).  A do not resuscitate (DNR) order, which is the instruction not to use cardiopulmonary resuscitation (CPR) if breathing or heartbeat stops.  Tube feeding.  Withholding of food and fluids.  Comfort (palliative) care when the goal becomes comfort rather than a cure.  Organ and tissue donation. A living will does not give instructions about distribution of your money and property if you should pass away. It is advisable to seek the expert advice of a lawyer in drawing up a will regarding your possessions. Decisions about taxes, beneficiaries, and asset distribution will be legally binding.  This process can relieve your family and friends of any burdens surrounding disputes or questions that may come up about the allocation of your assets. DO NOT RESUSCITATE (DNR) A do not resuscitate (DNR) order is a request to not have CPR in the event that your heart stops beating or you stop breathing. Unless given other instructions, a health care provider will try to help any patient whose heart has stopped or who has stopped breathing.  HEALTH CARE PROXY AND DURABLE POWER OF ATTORNEY FOR HEALTH CARE A health care proxy is a person (agent) appointed to make medical decisions for you if you cannot. Generally, people choose someone they know well and trust to represent their preferences when they can no longer do so. You should be sure to ask this person for agreement to act as your agent. An agent may have to exercise judgment in the event of a medical decision for which your wishes are not known. The durable power of attorney for health care is the legal document that names your health care proxy. Once written, it should be:  Signed.  Notarized.  Dated.  Copied.  Witnessed.  Incorporated into your medical record. You may also want to appoint someone to manage your financial affairs if you cannot. This is called a durable power of attorney for finances. It is a separate legal document from the durable power of attorney for health care. You may choose the same person or someone different from your health care proxy to act as your agent in financial matters. Document Released: 01/08/2008 Document Revised: 10/06/2013 Document Reviewed: 02/18/2013 [facility] Patient Information 2015 [place], [place]. This information is not intended to replace advice  given to you by your health care provider. Make sure you discuss any questions you have with your health care provider. DASH Eating Plan DASH stands for "Dietary Approaches to Stop Hypertension." The DASH eating plan is a healthy eating plan that has  been shown to reduce high blood pressure (hypertension). Additional health benefits may include reducing the risk of type 2 diabetes mellitus, heart disease, and stroke. The DASH eating plan may also help with weight loss. WHAT DO I NEED TO KNOW ABOUT THE DASH EATING PLAN? For the DASH eating plan, you will follow these general guidelines:  Choose foods with a percent daily value for sodium of less than 5% (as listed on the food label).  Use salt-free seasonings or herbs instead of table salt or sea salt.  Check with your health care provider or pharmacist before using salt substitutes.  Eat lower-sodium products, often labeled as "lower sodium" or "no salt added."  Eat fresh foods.  Eat more vegetables, fruits, and low-fat dairy products.  Choose whole grains. Look for the word "whole" as the first word in the ingredient list.  Choose fish and skinless chicken or [place] more often than red meat. Limit fish, poultry, and meat to 6 oz (170 g) each day.  Limit sweets, desserts, sugars, and sugary drinks.  Choose heart-healthy fats.  Limit cheese to 1 oz (28 g) per day.  Eat more home-cooked food and less restaurant, buffet, and fast food.  Limit fried foods.  Cook foods using methods other than frying.  Limit canned vegetables. If you do use them, rinse them well to decrease the sodium.  When eating at a restaurant, ask that your food be prepared with less salt, or no salt if possible. WHAT FOODS CAN I EAT? Seek help from a dietitian for individual calorie needs. Grains Whole grain or whole wheat bread. Brown rice. Whole grain or whole wheat pasta. Quinoa, bulgur, and whole grain cereals. Low-sodium cereals. Corn or whole wheat flour tortillas. Whole grain cornbread. Whole grain crackers. Low-sodium crackers. Vegetables Fresh or frozen vegetables (raw, steamed, roasted, or grilled). Low-sodium or reduced-sodium tomato and vegetable juices. Low-sodium or reduced-sodium tomato  sauce and paste. Low-sodium or reduced-sodium canned vegetables.  Fruits All fresh, canned (in natural juice), or frozen fruits. Meat and Other Protein Products Ground beef (85% or leaner), grass-fed beef, or beef trimmed of fat. Skinless chicken or [place]. Ground chicken or [place]. Pork trimmed of fat. All fish and seafood. Eggs. Dried beans, peas, or lentils. Unsalted nuts and seeds. Unsalted canned beans. Dairy Low-fat dairy products, such as skim or 1% milk, 2% or reduced-fat cheeses, low-fat ricotta or cottage cheese, or plain low-fat yogurt. Low-sodium or reduced-sodium cheeses. Fats and Oils Tub margarines without trans fats. Light or reduced-fat mayonnaise and salad dressings (reduced sodium). Avocado. Safflower, olive, or canola oils. Natural peanut or almond butter. Other Unsalted popcorn and pretzels. The items listed above may not be a complete list of recommended foods or beverages. Contact your dietitian for more options. WHAT FOODS ARE NOT RECOMMENDED? Grains White bread. White pasta. White rice. Refined cornbread. Bagels and croissants. Crackers that contain trans fat. Vegetables Creamed or fried vegetables. Vegetables in a cheese sauce. Regular canned vegetables. Regular canned tomato sauce and paste. Regular tomato and vegetable juices. Fruits Dried fruits. Canned fruit in light or heavy syrup. Fruit juice. Meat and Other Protein Products Fatty cuts of meat. Ribs, chicken wings, bacon, sausage, bologna, salami, chitterlings, fatback, hot dogs, bratwurst, and packaged luncheon meats. Salted  nuts and seeds. Canned beans with salt. Dairy Whole or 2% milk, cream, half-and-half, and cream cheese. Whole-fat or sweetened yogurt. Full-fat cheeses or blue cheese. Nondairy creamers and whipped toppings. Processed cheese, cheese spreads, or cheese curds. Condiments Onion and garlic salt, seasoned salt, table salt, and sea salt. Canned and packaged gravies. Worcestershire sauce. Tartar  sauce. Barbecue sauce. Teriyaki sauce. Soy sauce, including reduced sodium. Steak sauce. Fish sauce. Oyster sauce. Cocktail sauce. Horseradish. Ketchup and mustard. Meat flavorings and tenderizers. Bouillon cubes. Hot sauce. Tabasco sauce. Marinades. Taco seasonings. Relishes. Fats and Oils Butter, stick margarine, lard, shortening, ghee, and bacon fat. Coconut, palm kernel, or palm oils. Regular salad dressings. Other Pickles and olives. Salted popcorn and pretzels. The items listed above may not be a complete list of foods and beverages to avoid. Contact your dietitian for more information. WHERE CAN I FIND MORE INFORMATION? National [employer], [employer], [employer] Institute: travelstabloid.com Document Released: 09/20/2011 Document Revised: 02/15/2014 Document Reviewed: 08/05/2013 [facility] Patient Information 2015 [place], [place]. This information is not intended to replace advice given to you by your health care provider. Make sure you discuss any questions you have with your health care provider. Preventive Care for Adults A healthy lifestyle and preventive care can promote health and wellness. Preventive health guidelines for men include the following key practices:  A routine yearly physical is a good way to check with your health care provider about your health and preventative screening. It is a chance to share any concerns and updates on your health and to receive a thorough exam.  Visit your dentist for a routine exam and preventative care every 6 months. Brush your teeth twice a day and floss once a day. Good oral hygiene prevents tooth decay and gum disease.  The frequency of eye exams is based on your age, health, family medical history, use of contact lenses, and other factors. Follow your health care provider's recommendations for frequency of eye exams.  Eat a healthy diet. Foods such as vegetables, fruits, whole grains, low-fat dairy products, and lean  protein foods contain the nutrients you need without too many calories. Decrease your intake of foods high in solid fats, added sugars, and salt. Eat the right amount of calories for you.Get information about a proper diet from your health care provider, if necessary.  Regular physical exercise is one of the most important things you can do for your health. Most adults should get at least 150 minutes of moderate-intensity exercise (any activity that increases your heart rate and causes you to sweat) each week. In addition, most adults need muscle-strengthening exercises on 2 or more days a week.  Maintain a healthy weight. The body mass index (BMI) is a screening tool to identify possible weight problems. It provides an estimate of body fat based on height and weight. Your health care provider can find your BMI and can help you achieve or maintain a healthy weight.For adults 20 years and older:  A BMI below 18.5 is considered underweight.  A BMI of 18.5 to 24.9 is normal.  A BMI of 25 to 29.9 is considered overweight.  A BMI of 30 and above is considered obese.  Maintain normal blood lipids and cholesterol levels by exercising and minimizing your intake of saturated fat. Eat a balanced diet with plenty of fruit and vegetables. Blood tests for lipids and cholesterol should begin at age 55 and be repeated every 5 years. If your lipid or cholesterol levels are high, you are over 50,  or you are at high risk for heart disease, you may need your cholesterol levels checked more frequently.Ongoing high lipid and cholesterol levels should be treated with medicines if diet and exercise are not working.  If you smoke, find out from your health care provider how to quit. If you do not use tobacco, do not start.  Lung cancer screening is recommended for adults aged 18-80 years who are at high risk for developing lung cancer because of a history of smoking. A yearly low-dose CT scan of the lungs is  recommended for people who have at least a 30-pack-year history of smoking and are a current smoker or have quit within the past 15 years. A pack year of smoking is smoking an average of 1 pack of cigarettes a day for 1 year (for example: 1 pack a day for 30 years or 2 packs a day for 15 years). Yearly screening should continue until the smoker has stopped smoking for at least 15 years. Yearly screening should be stopped for people who develop a health problem that would prevent them from having lung cancer treatment.  If you choose to drink alcohol, do not have more than 2 drinks per day. One drink is considered to be 12 ounces (355 mL) of beer, 5 ounces (148 mL) of wine, or 1.5 ounces (44 mL) of liquor.  Avoid use of street drugs. Do not share needles with anyone. Ask for help if you need support or instructions about stopping the use of drugs.  High blood pressure causes heart disease and increases the risk of stroke. Your blood pressure should be checked at least every 1-2 years. Ongoing high blood pressure should be treated with medicines, if weight loss and exercise are not effective.  If you are 59-56 years old, ask your health care provider if you should take aspirin to prevent heart disease.  Diabetes screening involves taking a blood sample to check your fasting blood sugar level. This should be done once every 3 years, after age 29, if you are within normal weight and without risk factors for diabetes. Testing should be considered at a younger age or be carried out more frequently if you are overweight and have at least 1 risk factor for diabetes.  Colorectal cancer can be detected and often prevented. Most routine colorectal cancer screening begins at the age of 70 and continues through age 65. However, your health care provider may recommend screening at an earlier age if you have risk factors for colon cancer. On a yearly basis, your health care provider may provide home test kits to check  for hidden blood in the stool. Use of a small camera at the end of a tube to directly examine the colon (sigmoidoscopy or colonoscopy) can detect the earliest forms of colorectal cancer. Talk to your health care provider about this at age 37, when routine screening begins. Direct exam of the colon should be repeated every 5-10 years through age 8, unless early forms of precancerous polyps or small growths are found.  People who are at an increased risk for hepatitis B should be screened for this virus. You are considered at high risk for hepatitis B if:  You were born in a country where hepatitis B occurs often. Talk with your health care provider about which countries are considered high risk.  Your parents were born in a high-risk country and you have not received a shot to protect against hepatitis B (hepatitis B vaccine).  You have HIV  or AIDS.  You use needles to inject street drugs.  You live with, or have sex with, someone who has hepatitis B.  You are a man who has sex with other men (MSM).  You get hemodialysis treatment.  You take certain medicines for conditions such as cancer, organ transplantation, and autoimmune conditions.  Hepatitis C blood testing is recommended for all people born from [birth year] through 1963 and any individual with known risks for hepatitis C.  Practice safe sex. Use condoms and avoid high-risk sexual practices to reduce the spread of sexually transmitted infections (STIs). STIs include gonorrhea, chlamydia, syphilis, trichomonas, herpes, HPV, and human immunodeficiency virus (HIV). Herpes, HIV, and HPV are viral illnesses that have no cure. They can result in disability, cancer, and death.  If you are at risk of being infected with HIV, it is recommended that you take a prescription medicine daily to prevent HIV infection. This is called preexposure prophylaxis (PrEP). You are considered at risk if:  You are a man who has sex with other men (MSM) and have  other risk factors.  You are a heterosexual man, are sexually active, and are at increased risk for HIV infection.  You take drugs by injection.  You are sexually active with a partner who has HIV.  Talk with your health care provider about whether you are at high risk of being infected with HIV. If you choose to begin PrEP, you should first be tested for HIV. You should then be tested every 3 months for as long as you are taking PrEP.  A one-time screening for abdominal aortic aneurysm (AAA) and surgical repair of large AAAs by ultrasound are recommended for men ages 61 to 59 years who are current or former smokers.  Healthy men should no longer receive prostate-specific antigen (PSA) blood tests as part of routine cancer screening. Talk with your health care provider about prostate cancer screening.  Testicular cancer screening is not recommended for adult males who have no symptoms. Screening includes self-exam, a health care provider exam, and other screening tests. Consult with your health care provider about any symptoms you have or any concerns you have about testicular cancer.  Use sunscreen. Apply sunscreen liberally and repeatedly throughout the day. You should seek shade when your shadow is shorter than you. Protect yourself by wearing long sleeves, pants, a wide-brimmed hat, and sunglasses year round, whenever you are outdoors.  Once a month, do a whole-body skin exam, using a mirror to look at the skin on your back. Tell your health care provider about new moles, moles that have irregular borders, moles that are larger than a pencil eraser, or moles that have changed in shape or color.  Stay current with required vaccines (immunizations).  Influenza vaccine. All adults should be immunized every year.  Tetanus, diphtheria, and acellular pertussis (Td, Tdap) vaccine. An adult who has not previously received Tdap or who does not know his vaccine status should receive 1 dose of Tdap.  This initial dose should be followed by tetanus and diphtheria toxoids (Td) booster doses every 10 years. Adults with an unknown or incomplete history of completing a 3-dose immunization series with Td-containing vaccines should begin or complete a primary immunization series including a Tdap dose. Adults should receive a Td booster every 10 years.  Varicella vaccine. An adult without evidence of immunity to varicella should receive 2 doses or a second dose if he has previously received 1 dose.  Human papillomavirus (HPV) vaccine. Males aged 13-21  years who have not received the vaccine previously should receive the 3-dose series. Males aged 22-26 years may be immunized. Immunization is recommended through the age of 3 years for any male who has sex with males and did not get any or all doses earlier. Immunization is recommended for any person with an immunocompromised condition through the age of 78 years if he did not get any or all doses earlier. During the 3-dose series, the second dose should be obtained 4-8 weeks after the first dose. The third dose should be obtained 24 weeks after the first dose and 16 weeks after the second dose.  Zoster vaccine. One dose is recommended for adults aged 16 years or older unless certain conditions are present.  Measles, mumps, and rubella (MMR) vaccine. Adults born before [birth year] generally are considered immune to measles and mumps. Adults born in [birth year] or later should have 1 or more doses of MMR vaccine unless there is a contraindication to the vaccine or there is laboratory evidence of immunity to each of the three diseases. A routine second dose of MMR vaccine should be obtained at least 28 days after the first dose for students attending postsecondary schools, health care workers, or international travelers. People who received inactivated measles vaccine or an unknown type of measles vaccine during 1963-1967 should receive 2 doses of MMR vaccine. People who received  inactivated mumps vaccine or an unknown type of mumps vaccine before 1979 and are at high risk for mumps infection should consider immunization with 2 doses of MMR vaccine. Unvaccinated health care workers born before [birth year] who lack laboratory evidence of measles, mumps, or rubella immunity or laboratory confirmation of disease should consider measles and mumps immunization with 2 doses of MMR vaccine or rubella immunization with 1 dose of MMR vaccine.  Pneumococcal 13-valent conjugate (PCV13) vaccine. When indicated, a person who is uncertain of his immunization history and has no record of immunization should receive the PCV13 vaccine. An adult aged 18 years or older who has certain medical conditions and has not been previously immunized should receive 1 dose of PCV13 vaccine. This PCV13 should be followed with a dose of pneumococcal polysaccharide (PPSV23) vaccine. The PPSV23 vaccine dose should be obtained at least 8 weeks after the dose of PCV13 vaccine. An adult aged 16 years or older who has certain medical conditions and previously received 1 or more doses of PPSV23 vaccine should receive 1 dose of PCV13. The PCV13 vaccine dose should be obtained 1 or more years after the last PPSV23 vaccine dose.  Pneumococcal polysaccharide (PPSV23) vaccine. When PCV13 is also indicated, PCV13 should be obtained first. All adults aged 36 years and older should be immunized. An adult younger than age 66 years who has certain medical conditions should be immunized. Any person who resides in a nursing home or long-term care facility should be immunized. An adult smoker should be immunized. People with an immunocompromised condition and certain other conditions should receive both PCV13 and PPSV23 vaccines. People with human immunodeficiency virus (HIV) infection should be immunized as soon as possible after diagnosis. Immunization during chemotherapy or radiation therapy should be avoided. Routine use of PPSV23 vaccine is  not recommended for American Indians, [place] Natives, or people younger than 65 years unless there are medical conditions that require PPSV23 vaccine. When indicated, people who have unknown immunization and have no record of immunization should receive PPSV23 vaccine. One-time revaccination 5 years after the first dose of PPSV23 is recommended for people aged 19-64  years who have chronic kidney failure, nephrotic syndrome, asplenia, or immunocompromised conditions. People who received 1-2 doses of PPSV23 before age 53 years should receive another dose of PPSV23 vaccine at age 11 years or later if at least 5 years have passed since the previous dose. Doses of PPSV23 are not needed for people immunized with PPSV23 at or after age 27 years.  Meningococcal vaccine. Adults with asplenia or persistent complement component deficiencies should receive 2 doses of quadrivalent meningococcal conjugate (MenACWY-D) vaccine. The doses should be obtained at least 2 months apart. Microbiologists working with certain meningococcal bacteria, [place] recruits, people at risk during an outbreak, and people who travel to or live in countries with a high rate of meningitis should be immunized. A first-year college student up through age 67 years who is living in a residence hall should receive a dose if he did not receive a dose on or after his 16th birthday. Adults who have certain high-risk conditions should receive one or more doses of vaccine.  Hepatitis A vaccine. Adults who wish to be protected from this disease, have certain high-risk conditions, work with hepatitis A-infected animals, work in hepatitis A research labs, or travel to or work in countries with a high rate of hepatitis A should be immunized. Adults who were previously unvaccinated and who anticipate close contact with an international adoptee during the first 60 days after arrival in the [place] from a country with a high rate of hepatitis A should be  immunized.  Hepatitis B vaccine. Adults should be immunized if they wish to be protected from this disease, have certain high-risk conditions, may be exposed to blood or other infectious body fluids, are household contacts or sex partners of hepatitis B positive people, are clients or workers in certain care facilities, or travel to or work in countries with a high rate of hepatitis B.  Haemophilus influenzae type b (Hib) vaccine. A previously unvaccinated person with asplenia or sickle cell disease or having a scheduled splenectomy should receive 1 dose of Hib vaccine. Regardless of previous immunization, a recipient of a hematopoietic stem cell transplant should receive a 3-dose series 6-12 months after his successful transplant. Hib vaccine is not recommended for adults with HIV infection. Preventive Service / Frequency Ages 26 to 26  Blood pressure check.** / Every 1 to 2 years.  Lipid and cholesterol check.** / Every 5 years beginning at age 21.  Hepatitis C blood test.** / For any individual with known risks for hepatitis C.  Skin self-exam. / Monthly.  Influenza vaccine. / Every year.  Tetanus, diphtheria, and acellular pertussis (Tdap, Td) vaccine.** / Consult your health care provider. 1 dose of Td every 10 years.  Varicella vaccine.** / Consult your health care provider.  HPV vaccine. / 3 doses over 6 months, if 63 or younger.  Measles, mumps, rubella (MMR) vaccine.** / You need at least 1 dose of MMR if you were born in 1957 or later. You may also need a second dose.  Pneumococcal 13-valent conjugate (PCV13) vaccine.** / Consult your health care provider.  Pneumococcal polysaccharide (PPSV23) vaccine.** / 1 to 2 doses if you smoke cigarettes or if you have certain conditions.  Meningococcal vaccine.** / 1 dose if you are age 56 to 72 years and a Market researcher living in a residence hall, or have one of several medical conditions. You may also need additional  booster doses.  Hepatitis A vaccine.** / Consult your health care provider.  Hepatitis B vaccine.** /  Consult your health care provider.  Haemophilus influenzae type b (Hib) vaccine.** / Consult your health care provider. Ages 52 to 82  Blood pressure check.** / Every 1 to 2 years.  Lipid and cholesterol check.** / Every 5 years beginning at age 30.  Lung cancer screening. / Every year if you are aged 78-80 years and have a 30-pack-year history of smoking and currently smoke or have quit within the past 15 years. Yearly screening is stopped once you have quit smoking for at least 15 years or develop a health problem that would prevent you from having lung cancer treatment.  Fecal occult blood test (FOBT) of stool. / Every year beginning at age 51 and continuing until age 35. You may not have to do this test if you get a colonoscopy every 10 years.  Flexible sigmoidoscopy** or colonoscopy.** / Every 5 years for a flexible sigmoidoscopy or every 10 years for a colonoscopy beginning at age 54 and continuing until age 66.  Hepatitis C blood test.** / For all people born from [birth year] through 1963 and any individual with known risks for hepatitis C.  Skin self-exam. / Monthly.  Influenza vaccine. / Every year.  Tetanus, diphtheria, and acellular pertussis (Tdap/Td) vaccine.** / Consult your health care provider. 1 dose of Td every 10 years.  Varicella vaccine.** / Consult your health care provider.  Zoster vaccine.** / 1 dose for adults aged 24 years or older.  Measles, mumps, rubella (MMR) vaccine.** / You need at least 1 dose of MMR if you were born in 1957 or later. You may also need a second dose.  Pneumococcal 13-valent conjugate (PCV13) vaccine.** / Consult your health care provider.  Pneumococcal polysaccharide (PPSV23) vaccine.** / 1 to 2 doses if you smoke cigarettes or if you have certain conditions.  Meningococcal vaccine.** / Consult your health care provider.  Hepatitis A  vaccine.** / Consult your health care provider.  Hepatitis B vaccine.** / Consult your health care provider.  Haemophilus influenzae type b (Hib) vaccine.** / Consult your health care provider. Ages 92 and over  Blood pressure check.** / Every 1 to 2 years.  Lipid and cholesterol check.**/ Every 5 years beginning at age 60.  Lung cancer screening. / Every year if you are aged 86-80 years and have a 30-pack-year history of smoking and currently smoke or have quit within the past 15 years. Yearly screening is stopped once you have quit smoking for at least 15 years or develop a health problem that would prevent you from having lung cancer treatment.  Fecal occult blood test (FOBT) of stool. / Every year beginning at age 24 and continuing until age 11. You may not have to do this test if you get a colonoscopy every 10 years.  Flexible sigmoidoscopy** or colonoscopy.** / Every 5 years for a flexible sigmoidoscopy or every 10 years for a colonoscopy beginning at age 53 and continuing until age 40.  Hepatitis C blood test.** / For all people born from [birth year] through 1963 and any individual with known risks for hepatitis C.  Abdominal aortic aneurysm (AAA) screening.** / A one-time screening for ages 13 to 14 years who are current or former smokers.  Skin self-exam. / Monthly.  Influenza vaccine. / Every year.  Tetanus, diphtheria, and acellular pertussis (Tdap/Td) vaccine.** / 1 dose of Td every 10 years.  Varicella vaccine.** / Consult your health care provider.  Zoster vaccine.** / 1 dose for adults aged 19 years or older.  Pneumococcal 13-valent conjugate (PCV13)  vaccine.** / Consult your health care provider.  Pneumococcal polysaccharide (PPSV23) vaccine.** / 1 dose for all adults aged 23 years and older.  Meningococcal vaccine.** / Consult your health care provider.  Hepatitis A vaccine.** / Consult your health care provider.  Hepatitis B vaccine.** / Consult your health care  provider.  Haemophilus influenzae type b (Hib) vaccine.** / Consult your health care provider. **Family history and personal history of risk and conditions may change your health care provider's recommendations. Document Released: 11/27/2001 Document Revised: 10/06/2013 Document Reviewed: 02/26/2011 [facility] Patient Information 2015 [place], [place]. This information is not intended to replace advice given to you by your health care provider. Make sure you discuss any questions you have with your health care provider.

## 2015-02-02 NOTE — Progress Notes (Signed)
Subjective:   Luis Deleon is a 74 y.o. male who presents for an Initial Medicare Annual Wellness Visit. Luis Deleon is retired from [employer]. He is married and lives at home with his wife. He is alert and oriented to person,place, and time. He reports that he enjoys life and tries to stay busy and active. He has a history of Crohn's disease since 1984 and is followed by Dr Veleta Miners and does not report any problems at this time. He does report that he is monitoring his BP because Dr. Laurance Flatten has stopped 2 of his BP medications and started him on a new one, He plans to bring a record of his BP's soon and reports that he his tolerating the new medication well.  Review of Systems  Cardiac Risk Factors include: advanced age (>8mn, >>54women);dyslipidemia;hypertension;male gender;obesity (BMI >30kg/m2);smoking/ tobacco exposure    Objective:    Today's Vitals   02/02/15 0908  BP: 121/73  Pulse: 99  Resp: 20  Height: 5' 8.5" (1.74 m)  Weight: 230 lb 6.4 oz (104.509 kg)    Current Medications (verified) Outpatient Encounter Prescriptions as of 02/02/2015  Medication Sig  . allopurinol (ZYLOPRIM) 100 MG tablet TAKE 2 TABLETS DAILY AS DIRECTED  . aspirin 81 MG EC tablet Take 81 mg by mouth daily.    .Marland Kitchenatorvastatin (LIPITOR) 40 MG tablet TAKE 1 TABLET EVERY DAY AS DIRECTED  . Cholecalciferol (VITAMIN D3) 1000 UNITS CAPS Take 2 capsules by mouth daily.   .Marland Kitchengemfibrozil (LOPID) 600 MG tablet TAKE 1 TABLET BY MOUTH EVERY DAY AS DIRECTED  . hydrochlorothiazide (HYDRODIURIL) 12.5 MG tablet Take 1 tablet (12.5 mg total) by mouth daily. As directed  . HYDROmorphone (DILAUDID) 2 MG tablet Take 2 mg by mouth every 6 (six) hours as needed for severe pain.  .Marland KitchenKLOR-CON M20 20 MEQ tablet TAKE 1 TAB TWICE DAILY ON MON, WED AND FRIDAY-DAILY EVERY OTHER DAY  . losartan (COZAAR) 100 MG tablet Take 1 tablet (100 mg total) by mouth daily. As directed  . OMEGA-3 1000 MG CAPS Take 2 capsules by mouth 2  (two) times daily.  .Marland KitchenamLODipine (NORVASC) 5 MG tablet TAKE 2 TABLETS BY MOUTH DAILY AS DIRECTED (Patient not taking: Reported on 02/02/2015)  . colchicine (COLCRYS) 0.6 MG tablet Take 1 tablet (0.6 mg total) by mouth daily. (Patient not taking: Reported on 11/25/2014)  . KLOR-CON M20 20 MEQ tablet TAKE 1 TAB TWICE DAILY ON MON, WED AND FRIDAY-DAILY EVERY OTHER DAY (Patient not taking: Reported on 02/02/2015)  . olmesartan-hydrochlorothiazide (BENICAR HCT) 40-12.5 MG per tablet Take 1 tablet by mouth daily. As directed (Patient not taking: Reported on 02/02/2015)    Allergies (verified) Colchicine   History: Past Medical History  Diagnosis Date  . Hyperlipemia   . Hypertension   . Diverticulosis   . Gout   . Small bowel obstruction 2003  . Crohn disease   . Polycythemia   . Abnormal LFTs (liver function tests)   . Arthritis   . Hyperplastic colon polyp 2012  . Nephrolithiasis   . Hepatic cyst   . Prostate cancer     8 or 9 years   Past Surgical History  Procedure Laterality Date  . Tonsillectomy    . Ventral hernia repair      2 times  . Lithotripsy  1980  . Exploratory laparotomy w/ bowel resection  2003  . Prostatectomy    . Appendectomy  2003   Family History  Problem Relation Age of Onset  . Heart disease Mother   . Cervical cancer Mother   . Cancer Mother     cervical  . Heart disease Father   . Heart failure Father   . Heart disease Maternal Uncle   . Colon cancer Neg Hx   . Dislocations Daughter 8    Bilateral knee   Social History   Occupational History  . Retired      Event organiser   Social History Main Topics  . Smoking status: Former Smoker -- 1.00 packs/day for 38 years    Types: Cigarettes    Quit date: 03/19/1991  . Smokeless tobacco: Current User    Types: Chew  . Alcohol Use: Not on file  . Drug Use: No  . Sexual Activity: No   Tobacco Counseling Ready to quit: No Counseling given: No  Patient does chew tobacco and declined  information for cessation.   Activities of Daily Living In your present state of health, do you have any difficulty performing the following activities: 02/02/2015  Hearing? N  Vision? N  Difficulty concentrating or making decisions? N  Walking or climbing stairs? N  Dressing or bathing? N  Doing errands, shopping? N  Preparing Food and eating ? N  Using the Toilet? N  In the past six months, have you accidently leaked urine? Y  Do you have problems with loss of bowel control? N  Managing your Medications? N  Managing your Finances? N  Housekeeping or managing your Housekeeping? N    Immunizations and Health Maintenance Immunization History  Administered Date(s) Administered  . Influenza Whole 07/15/2010  . Influenza,inj,Quad PF,36+ Mos 08/04/2013, 07/19/2014  . Pneumococcal Conjugate-13 08/15/2013  . Pneumococcal Polysaccharide-23 08/08/2011  . Td 07/15/2005  . Tdap 08/20/2011  . Zoster 12/24/2013   There are no preventive care reminders to display for this patient.  Patient Care Team: Chipper Herb, MD as PCP - General (Family Medicine) Lafayette Dragon, MD as Consulting Physician (Gastroenterology) Curt Bears, MD as Consulting Physician (Oncology) Fanny Skates, MD as Consulting Physician (General Surgery) Herminio Commons, MD as Consulting Physician (Cardiology) Irine Seal, MD as Attending Physician (Urology)      Assessment:   This is a routine Annual wellness visit for Luis Deleon.   Hearing/Vision screen Pt reports no hearing deficits. He does wear glasses and his eye exam if up to date. He will call later this year for his routine eye appointment. He goes to [facility]. in [place].  Dietary issues and exercise activities discussed: Current Exercise Habits:: The patient does not participate in regular exercise at present (works out in the yard: mows )  Goals    None     Depression Screen PHQ 2/9 Scores 02/02/2015 11/25/2014 09/20/2014 07/19/2014  PHQ - 2 Score  0 0 0 0    Fall Risk Fall Risk  02/02/2015 11/25/2014 09/20/2014 07/19/2014 04/02/2014  Falls in the past year? No No No No No    Cognitive Function: MMSE - Mini Mental State Exam 02/02/2015  Orientation to time 5  Orientation to Place 5  Registration 3  Attention/ Calculation 5  Recall 3  Language- name 2 objects 2  Language- repeat 1  Language- follow 3 step command 3  Language- read & follow direction 1  Write a sentence 1  Copy design 1  Total score 30    Screening Tests Health Maintenance  Topic Date Due  . INFLUENZA VACCINE  05/16/2015  . COLON CANCER SCREENING  ANNUAL FOBT  10/02/2015  . COLONOSCOPY  03/21/2016  . TETANUS/TDAP  08/15/2021  . ZOSTAVAX  Completed  . PNA vac Low Risk Adult  Completed        Plan:  Continue with therapeutic lifestyle changes which include good diet and exercise. DASH diet information give to patient in his AVS. Continue to monitor BP and record . Please bring in your readings for Dr. Laurance Flatten to review. Encouraged patient to start walking 2-3 times per week for 15-20 minutes at a time. Patient is agreeable with this.   During the course of the visit Ivis was educated and counseled about the following appropriate screening and preventive services:   Vaccines to include Pneumoccal, Influenza, Tdap,  Zostavax : all are up to date  Electrocardiogram last noted 08/04/13  Colorectal cancer screening last colonoscopy 03/2011 per Dr. Olevia Perches. Due to history of Crohn's disease patient has one every 5 years. FOBT was negative 08/04/14. Encouraged yearly FOBT  Cardiovascular disease screening lab work completed 11/25/14  Diabetes screening has blood work at routine office visits last obtained 11/25/14  Glaucoma screening patient will call [facility] later this year to schedule an appointment  Nutrition counseling DASH diet recommended and info given  Prostate cancer screening patient has history of prostate cancer with a radical prostatectomy  11/2004. He has yearly PSA/DRE per Dr. Laurance Flatten. Last PSA 09/20/2014 was normal.  Smoking cessation counseling :patient does not smoke but does occasionally chew tobacco and does not wish to stop and declined information for cessation.   Patient Instructions (the written plan) were given to the patient.   Joneen Boers, RN   02/02/2015       I have reviewed and agree with the above AWV documentation.  Claretta Fraise, M.D.

## 2015-02-14 ENCOUNTER — Other Ambulatory Visit: Payer: Self-pay | Admitting: Nurse Practitioner

## 2015-02-14 NOTE — Telephone Encounter (Signed)
Next appt in June

## 2015-02-17 ENCOUNTER — Telehealth: Payer: Self-pay | Admitting: Family Medicine

## 2015-02-18 NOTE — Telephone Encounter (Signed)
I think that I reviewed these blood pressure readings and maybe no one call the patient please put them back on my desk for review if possible so the patient can be called

## 2015-02-19 NOTE — Telephone Encounter (Signed)
Pt aware of good readings.

## 2015-02-19 NOTE — Telephone Encounter (Signed)
bp readings on [facility] desk for review.

## 2015-03-20 ENCOUNTER — Other Ambulatory Visit: Payer: Self-pay | Admitting: Family Medicine

## 2015-04-05 ENCOUNTER — Encounter: Payer: Self-pay | Admitting: Family Medicine

## 2015-04-05 ENCOUNTER — Ambulatory Visit (INDEPENDENT_AMBULATORY_CARE_PROVIDER_SITE_OTHER): Payer: Medicare Other | Admitting: Family Medicine

## 2015-04-05 VITALS — BP 125/76 | HR 87 | Temp 97.7°F | Ht 68.5 in | Wt 236.0 lb

## 2015-04-05 DIAGNOSIS — E8881 Metabolic syndrome: Secondary | ICD-10-CM

## 2015-04-05 DIAGNOSIS — E559 Vitamin D deficiency, unspecified: Secondary | ICD-10-CM | POA: Diagnosis not present

## 2015-04-05 DIAGNOSIS — I10 Essential (primary) hypertension: Secondary | ICD-10-CM

## 2015-04-05 DIAGNOSIS — D696 Thrombocytopenia, unspecified: Secondary | ICD-10-CM | POA: Diagnosis not present

## 2015-04-05 DIAGNOSIS — N400 Enlarged prostate without lower urinary tract symptoms: Secondary | ICD-10-CM

## 2015-04-05 DIAGNOSIS — R070 Pain in throat: Secondary | ICD-10-CM

## 2015-04-05 DIAGNOSIS — I700 Atherosclerosis of aorta: Secondary | ICD-10-CM

## 2015-04-05 DIAGNOSIS — E785 Hyperlipidemia, unspecified: Secondary | ICD-10-CM | POA: Diagnosis not present

## 2015-04-05 LAB — POCT CBC
GRANULOCYTE PERCENT: 68.2 % (ref 37–80)
HCT, POC: 53.3 % (ref 43.5–53.7)
Hemoglobin: 17.1 g/dL (ref 14.1–18.1)
LYMPH, POC: 1.6 (ref 0.6–3.4)
MCH: 29.9 pg (ref 27–31.2)
MCHC: 32.1 g/dL (ref 31.8–35.4)
MCV: 93.1 fL (ref 80–97)
MPV: 8.9 fL (ref 0–99.8)
POC Granulocyte: 4.7 (ref 2–6.9)
POC LYMPH PERCENT: 23.1 %L (ref 10–50)
Platelet Count, POC: 137 10*3/uL — AB (ref 142–424)
RBC: 5.72 M/uL (ref 4.69–6.13)
RDW, POC: 15.2 %
WBC: 6.9 10*3/uL (ref 4.6–10.2)

## 2015-04-05 NOTE — Patient Instructions (Addendum)
Medicare Annual Wellness Visit  [facility] and the medical providers at [facility] strive to bring you the best medical care.  In doing so we not only want to address your current medical conditions and concerns but also to detect new conditions early and prevent illness, disease and health-related problems.    Medicare offers a yearly Wellness Visit which allows our clinical staff to assess your need for preventative services including immunizations, lifestyle education, counseling to decrease risk of preventable diseases and screening for fall risk and other medical concerns.    This visit is provided free of charge (no copay) for all Medicare recipients. The clinical pharmacists at [facility] have begun to conduct these Wellness Visits which will also include a thorough review of all your medications.    As you primary medical provider recommend that you make an appointment for your Annual Wellness Visit if you have not done so already this year.  You may set up this appointment before you leave today or you may call back ([phone]) and schedule an appointment.  Please make sure when you call that you mention that you are scheduling your Annual Wellness Visit with the clinical pharmacist so that the appointment may be made for the proper length of time.     Continue current medications. Continue good therapeutic lifestyle changes which include good diet and exercise. Fall precautions discussed with patient. If an FOBT was given today- please return it to our front desk. If you are over 92 years old - you may need Prevnar 66 or the adult Pneumonia vaccine.  Flu Shots are still available at our office. If you still haven't had one please call to set up a nurse visit to get one.   After your visit with [place] today you will receive a survey in the mail or online from [employer] regarding your care with [place]. Please take a moment to  fill this out. Your feedback is very important to [place] as you can help [place] better understand your patient needs as well as improve your experience and satisfaction. WE CARE ABOUT YOU!!!   Continue to follow-up with the gastroenterologist as needed Take Zantac 150 twice daily before breakfast and supper over-the-counter for one month If the neck discomfort does not resolve, return to clinic and get C-spine films and remind my nurse to have me call Dr. Sydell Axon bradycardia. Continue to watch sodium intake and watch diet as closely as possible to keep weight as low as possible Drink plenty of water He should use Flonase at nighttime, 1-2 sprays each nostril. He should use nasal saline 1 spray each nostril 3 or 4 times daily

## 2015-04-05 NOTE — Progress Notes (Signed)
Subjective:    Patient ID: Luis Deleon, male    DOB: 06-15-1941, 74 y.o.   MRN: [id number]  HPI  Pt here for follow up and management of chronic medical problems which includes hypertension and hyperlipidemia. He is taking medications regularly. The patient does have some dull aching in his neck and throat area. Also indicates his blood pressure seems to run higher at nighttime. The patient continues to have his ongoing problems with Crohn's disease but no change overall with this. He has mostly loose bowel movements usually about 3 a day and occasional formed bowel movement. He does not seem blood in the stool. He calls a gastroenterologist periodically when he needs to see her and she usually sees him at that time. He does complain of some aching in the throat but no increased heartburn. He denies chest pain or shortness of breath or trouble passing his water.       Patient Active Problem List   Diagnosis Date Noted  . Abdominal aortic atherosclerosis 11/25/2014  . Metabolic syndrome 38/45/3646  . DOE (dyspnea on exertion) 08/11/2013  . 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 POLYCYTHEMIA 11/03/2008  . LIVER FUNCTION TESTS, ABNORMAL, HX OF 09/24/2008  . Hyperlipidemia 07/28/2008  . GOUT, UNSPECIFIED 07/28/2008  . HTN (hypertension) 07/28/2008  . Regional enteritis 07/28/2008  . DIVERTICULOSIS, COLON 07/28/2008  . PROSTATE CANCER, PERSONAL HX 07/28/2008  . SMALL BOWEL OBSTRUCTION, HX OF 07/28/2008  . NEPHROLITHIASIS, HX OF 07/28/2008   Outpatient Encounter Prescriptions as of 04/05/2015  Medication Sig  . allopurinol (ZYLOPRIM) 100 MG tablet TAKE 2 TABLETS DAILY AS DIRECTED  . aspirin 81 MG EC tablet Take 81 mg by mouth daily.    Marland Kitchen atorvastatin (LIPITOR) 40 MG tablet TAKE 1 TABLET EVERY DAY AS DIRECTED  . Cholecalciferol (VITAMIN D3) 1000 UNITS CAPS Take 2 capsules by mouth daily.   . colchicine (COLCRYS) 0.6 MG tablet Take 1 tablet (0.6 mg total) by mouth daily.  (Patient not taking: Reported on 04/05/2015)  . gemfibrozil (LOPID) 600 MG tablet TAKE 1 TABLET BY MOUTH EVERY DAY AS DIRECTED  . hydrochlorothiazide (HYDRODIURIL) 12.5 MG tablet Take 1 tablet (12.5 mg total) by mouth daily. As directed  . HYDROmorphone (DILAUDID) 2 MG tablet Take 2 mg by mouth every 6 (six) hours as needed for severe pain.  Marland Kitchen KLOR-CON M20 20 MEQ tablet TAKE 1 TAB TWICE DAILY ON MON, WED AND FRIDAY-DAILY EVERY OTHER DAY  . losartan (COZAAR) 100 MG tablet Take 1 tablet (100 mg total) by mouth daily. As directed  . OMEGA-3 1000 MG CAPS Take 2 capsules by mouth 2 (two) times daily.  . [DISCONTINUED] amLODipine (NORVASC) 5 MG tablet TAKE 2 TABLETS BY MOUTH DAILY AS DIRECTED (Patient not taking: Reported on 02/02/2015)  . [DISCONTINUED] atorvastatin (LIPITOR) 40 MG tablet TAKE 1 TABLET EVERY DAY AS DIRECTED  . [DISCONTINUED] KLOR-CON M20 20 MEQ tablet TAKE 1 TAB TWICE DAILY ON MON, WED AND FRIDAY-DAILY EVERY OTHER DAY  . [DISCONTINUED] olmesartan-hydrochlorothiazide (BENICAR HCT) 40-12.5 MG per tablet Take 1 tablet by mouth daily. As directed (Patient not taking: Reported on 02/02/2015)   No facility-administered encounter medications on file as of 04/05/2015.     Review of Systems  Constitutional: Negative.   HENT: Negative.   Eyes: Negative.   Respiratory: Negative.   Cardiovascular: Negative.   Gastrointestinal: Negative.   Endocrine: Negative.   Genitourinary: Negative.   Musculoskeletal: Negative.        "Dull aching" - in neck and  throat area  Skin: Negative.   Allergic/Immunologic: Negative.   Neurological: Negative.   Hematological: Negative.   Psychiatric/Behavioral: Negative.        Objective:   Physical Exam  Constitutional: He is oriented to person, place, and time. He appears well-developed and well-nourished. No distress.  HENT:  Head: Normocephalic and atraumatic.  Right Ear: External ear normal.  Left Ear: External ear normal.  Mouth/Throat: Oropharynx  is clear and moist. No oropharyngeal exudate.  Nasal congestion bilaterally  Eyes: Conjunctivae and EOM are normal. Pupils are equal, round, and reactive to light. Right eye exhibits no discharge. Left eye exhibits no discharge. No scleral icterus.  Neck: Normal range of motion. Neck supple. No thyromegaly present.  No adenopathy, carotid bruits, or thyromegaly.  Cardiovascular: Normal rate, regular rhythm, normal heart sounds and intact distal pulses.   No murmur heard. At 72/m  Pulmonary/Chest: Effort normal and breath sounds normal. No respiratory distress. He has no wheezes. He has no rales. He exhibits no tenderness.  Clear anteriorly and posteriorly  Abdominal: Soft. Bowel sounds are normal. He exhibits no mass. There is no tenderness. There is no rebound and no guarding.  The abdomen was nontender and without organ enlargement or masses.  Musculoskeletal: Normal range of motion. He exhibits no edema or tenderness.  Lymphadenopathy:    He has no cervical adenopathy.  Neurological: He is alert and oriented to person, place, and time. He has normal reflexes. No cranial nerve deficit.  Skin: Skin is warm and dry. No rash noted. No erythema. No pallor.  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 His behavior is normal. Judgment and thought content normal.  Pleasant and cooperative  Nursing note and vitals reviewed.  BP 125/76 mmHg  Pulse 87  Temp(Src) 97.7 F (36.5 C) (Oral)  Ht 5' 8.5" (1.74 m)  Wt 236 lb (107.049 kg)  BMI 35.36 kg/m2        Assessment & Plan:  1. Prostate cancer -The patient is having no problems with voiding and is doing well with this at this time. - POCT CBC  2. Hyperlipidemia -He should continue with his atorvastatin and with as aggressive therapeutic lifestyle changes as possible - POCT CBC - NMR, lipoprofile  3. Essential hypertension -The blood pressure is stable today and he should  continue with his current treatment and sodium restriction - POCT CBC - BMP8+EGFR - Hepatic function panel  4. Metabolic syndrome -Continue working with diet and weight loss as much as possible - POCT CBC - BMP8+EGFR  5. Vitamin D deficiency -Continue current treatment pending results of lab work - POCT CBC - Vit D  25 hydroxy (rtn osteoporosis monitoring)  6. Throat pain -Take Zantac 150 twice daily before breakfast and supper and if the neck and throat aching are not better in 4 weeks he will need to have C-spine films and a possible referral for an endoscopy  Patient Instructions                       Medicare Annual Wellness Visit  [facility] and the medical providers at [facility] strive to bring you the best medical care.  In doing so we not only want to address your current medical conditions and concerns but also to detect new conditions early and prevent illness, disease and health-related problems.    Medicare offers a yearly Wellness Visit which allows  our clinical staff to assess your need for preventative services including immunizations, lifestyle education, counseling to decrease risk of preventable diseases and screening for fall risk and other medical concerns.    This visit is provided free of charge (no copay) for all Medicare recipients. The clinical pharmacists at [facility] have begun to conduct these Wellness Visits which will also include a thorough review of all your medications.    As you primary medical provider recommend that you make an appointment for your Annual Wellness Visit if you have not done so already this year.  You may set up this appointment before you leave today or you may call back ([phone]) and schedule an appointment.  Please make sure when you call that you mention that you are scheduling your Annual Wellness Visit with the clinical pharmacist so that the appointment may be made for the proper  length of time.     Continue current medications. Continue good therapeutic lifestyle changes which include good diet and exercise. Fall precautions discussed with patient. If an FOBT was given today- please return it to our front desk. If you are over 4 years old - you may need Prevnar 64 or the adult Pneumonia vaccine.  Flu Shots are still available at our office. If you still haven't had one please call to set up a nurse visit to get one.   After your visit with [place] today you will receive a survey in the mail or online from [employer] regarding your care with [place]. Please take a moment to fill this out. Your feedback is very important to [place] as you can help [place] better understand your patient needs as well as improve your experience and satisfaction. WE CARE ABOUT YOU!!!   Continue to follow-up with the gastroenterologist as needed Take Zantac 150 twice daily before breakfast and supper over-the-counter for one month If the neck discomfort does not resolve, return to clinic and get C-spine films and remind my nurse to have me call Dr. Sydell Axon bradycardia. Continue to watch sodium intake and watch diet as closely as possible to keep weight as low as possible Drink plenty of water He should use Flonase at nighttime, 1-2 sprays each nostril. He should use nasal saline 1 spray each nostril 3 or 4 times daily   Arrie Senate MD

## 2015-04-06 LAB — BMP8+EGFR
BUN/Creatinine Ratio: 10 (ref 10–22)
BUN: 14 mg/dL (ref 8–27)
CALCIUM: 9.4 mg/dL (ref 8.6–10.2)
CO2: 26 mmol/L (ref 18–29)
Chloride: 100 mmol/L (ref 97–108)
Creatinine, Ser: 1.35 mg/dL — ABNORMAL HIGH (ref 0.76–1.27)
GFR calc non Af Amer: 52 mL/min/{1.73_m2} — ABNORMAL LOW (ref >59)
GFR, EST AFRICAN AMERICAN: 61 mL/min/{1.73_m2} (ref >59)
GLUCOSE: 98 mg/dL (ref 65–99)
POTASSIUM: 3.8 mmol/L (ref 3.5–5.2)
Sodium: 141 mmol/L (ref 134–144)

## 2015-04-06 LAB — NMR, LIPOPROFILE
Cholesterol: 137 mg/dL (ref 100–199)
HDL Cholesterol by NMR: 34 mg/dL — ABNORMAL LOW
HDL Particle Number: 27.4 umol/L — ABNORMAL LOW
LDL Particle Number: 788 nmol/L
LDL Size: 20.1 nm
LDL-C: 60 mg/dL (ref 0–99)
LP-IR Score: 60 — ABNORMAL HIGH
Small LDL Particle Number: 468 nmol/L
Triglycerides by NMR: 215 mg/dL — ABNORMAL HIGH (ref 0–149)

## 2015-04-06 LAB — HEPATIC FUNCTION PANEL
ALT: 28 IU/L (ref 0–44)
AST: 34 IU/L (ref 0–40)
Albumin: 3.8 g/dL (ref 3.5–4.8)
Alkaline Phosphatase: 69 IU/L (ref 39–117)
Bilirubin Total: 1.2 mg/dL (ref 0.0–1.2)
Bilirubin, Direct: 0.27 mg/dL (ref 0.00–0.40)
Total Protein: 6.5 g/dL (ref 6.0–8.5)

## 2015-04-06 LAB — VITAMIN D 25 HYDROXY (VIT D DEFICIENCY, FRACTURES): Vit D, 25-Hydroxy: 32 ng/mL (ref 30.0–100.0)

## 2015-04-09 ENCOUNTER — Other Ambulatory Visit: Payer: Self-pay | Admitting: Family Medicine

## 2015-04-09 ENCOUNTER — Other Ambulatory Visit: Payer: Self-pay | Admitting: Nurse Practitioner

## 2015-04-10 ENCOUNTER — Other Ambulatory Visit: Payer: Self-pay | Admitting: Family Medicine

## 2015-04-14 ENCOUNTER — Other Ambulatory Visit: Payer: Self-pay

## 2015-04-14 MED ORDER — ATORVASTATIN CALCIUM 40 MG PO TABS: ORAL_TABLET | ORAL | Status: DC

## 2015-05-02 ENCOUNTER — Telehealth: Payer: Self-pay | Admitting: Family Medicine

## 2015-05-02 NOTE — Telephone Encounter (Signed)
Patient aware that it was probably the [facility]

## 2015-05-17 ENCOUNTER — Telehealth: Payer: Self-pay | Admitting: Pharmacist

## 2015-05-17 NOTE — Telephone Encounter (Signed)
Received communication from patient's insurance - [employer] that compliance with RAS / losartan is only about 54%.  Called patient to verify how he is taking losartan and he has been instructed to take 1/2 tablet daily and has been doing so for several months.  Patient's medication record updated.

## 2015-07-08 ENCOUNTER — Other Ambulatory Visit: Payer: Self-pay | Admitting: Family Medicine

## 2015-08-15 ENCOUNTER — Other Ambulatory Visit: Payer: Self-pay | Admitting: Family Medicine

## 2015-08-22 ENCOUNTER — Other Ambulatory Visit: Payer: Self-pay | Admitting: Family Medicine

## 2015-08-22 ENCOUNTER — Telehealth: Payer: Self-pay | Admitting: Family Medicine

## 2015-08-22 ENCOUNTER — Other Ambulatory Visit: Payer: Self-pay | Admitting: *Deleted

## 2015-08-22 ENCOUNTER — Ambulatory Visit (INDEPENDENT_AMBULATORY_CARE_PROVIDER_SITE_OTHER): Payer: Medicare Other | Admitting: Family Medicine

## 2015-08-22 ENCOUNTER — Ambulatory Visit (INDEPENDENT_AMBULATORY_CARE_PROVIDER_SITE_OTHER): Payer: Medicare Other

## 2015-08-22 ENCOUNTER — Encounter: Payer: Self-pay | Admitting: Family Medicine

## 2015-08-22 VITALS — BP 96/62 | HR 82 | Temp 97.5°F | Ht 68.5 in | Wt 236.0 lb

## 2015-08-22 DIAGNOSIS — Z0000 Encounter for general adult medical examination without abnormal findings: Secondary | ICD-10-CM | POA: Diagnosis not present

## 2015-08-22 DIAGNOSIS — Z8546 Personal history of malignant neoplasm of prostate: Secondary | ICD-10-CM

## 2015-08-22 DIAGNOSIS — E559 Vitamin D deficiency, unspecified: Secondary | ICD-10-CM

## 2015-08-22 DIAGNOSIS — R05 Cough: Secondary | ICD-10-CM | POA: Diagnosis not present

## 2015-08-22 DIAGNOSIS — J329 Chronic sinusitis, unspecified: Secondary | ICD-10-CM | POA: Diagnosis not present

## 2015-08-22 DIAGNOSIS — E785 Hyperlipidemia, unspecified: Secondary | ICD-10-CM | POA: Diagnosis not present

## 2015-08-22 DIAGNOSIS — E8881 Metabolic syndrome: Secondary | ICD-10-CM | POA: Diagnosis not present

## 2015-08-22 DIAGNOSIS — I10 Essential (primary) hypertension: Secondary | ICD-10-CM | POA: Diagnosis not present

## 2015-08-22 DIAGNOSIS — R059 Cough, unspecified: Secondary | ICD-10-CM

## 2015-08-22 DIAGNOSIS — D696 Thrombocytopenia, unspecified: Secondary | ICD-10-CM

## 2015-08-22 DIAGNOSIS — N400 Enlarged prostate without lower urinary tract symptoms: Secondary | ICD-10-CM

## 2015-08-22 DIAGNOSIS — I700 Atherosclerosis of aorta: Secondary | ICD-10-CM

## 2015-08-22 MED ORDER — NYSTATIN-TRIAMCINOLONE 100000-0.1 UNIT/GM-% EX CREA: 1.0000 "application " | TOPICAL_CREAM | Freq: Two times a day (BID) | CUTANEOUS | Status: DC

## 2015-08-22 MED ORDER — AMOXICILLIN-POT CLAVULANATE 875-125 MG PO TABS: 1.0000 | ORAL_TABLET | Freq: Two times a day (BID) | ORAL | Status: DC

## 2015-08-22 NOTE — Patient Instructions (Addendum)
Medicare Annual Wellness Visit  [facility] and the medical providers at [facility] strive to bring you the best medical care.  In doing so we not only want to address your current medical conditions and concerns but also to detect new conditions early and prevent illness, disease and health-related problems.    Medicare offers a yearly Wellness Visit which allows our clinical staff to assess your need for preventative services including immunizations, lifestyle education, counseling to decrease risk of preventable diseases and screening for fall risk and other medical concerns.    This visit is provided free of charge (no copay) for all Medicare recipients. The clinical pharmacists at [facility] have begun to conduct these Wellness Visits which will also include a thorough review of all your medications.    As you primary medical provider recommend that you make an appointment for your Annual Wellness Visit if you have not done so already this year.  You may set up this appointment before you leave today or you may call back ([phone]) and schedule an appointment.  Please make sure when you call that you mention that you are scheduling your Annual Wellness Visit with the clinical pharmacist so that the appointment may be made for the proper length of time.     Continue current medications. Continue good therapeutic lifestyle changes which include good diet and exercise. Fall precautions discussed with patient. If an FOBT was given today- please return it to our front desk. If you are over 67 years old - you may need Prevnar 50 or the adult Pneumonia vaccine.  **Flu shots are available--- please call and schedule a FLU-CLINIC appointment**  After your visit with [place] today you will receive a survey in the mail or online from [employer] regarding your care with [place]. Please take a moment to fill this out. Your feedback is very  important to [place] as you can help [place] better understand your patient needs as well as improve your experience and satisfaction. WE CARE ABOUT YOU!!!   The patient will take his antibiotic as directed He will use nasal saline for nasal congestion frequently in each nostril through the day He will take Mucinex, blue and white in color, maximum strength 1 twice daily with a large glass of water He will follow-up with the gastroenterologist as planned for his colonoscopy repeat

## 2015-08-22 NOTE — Telephone Encounter (Signed)
Ins. Will only cover nystatin only, if you want mycolog, we must do an appeal, let me know

## 2015-08-22 NOTE — Telephone Encounter (Signed)
Please do feel because Mycolog is the only thing that works for this patient

## 2015-08-22 NOTE — Telephone Encounter (Signed)
Please refill Mycolog prescription

## 2015-08-22 NOTE — Progress Notes (Signed)
Subjective:    Patient ID: Luis Deleon, male    DOB: 1941/09/06, 74 y.o.   MRN: [id number]  HPI Pt here for Annual Physical and follow up and management of chronic medical problems which includes hyperlipidemia and hypertension. He is taking medications regularly. The patient is doing well overall he does have increased problems with cough and congestion. He has a history of metabolic syndrome hypertension hyperlipidemia and gout. The patient is due a colonoscopy soon because of his Crohn's disease. This has been stable other than some loose bowel movements which she always has. He denies chest pain shortness of breath trouble swallowing and heartburn indigestion nausea and vomiting or blood in the stool. He does have a cough and congestion and he says the sputum is clear. Started with a really bad sore throat and he has continued to cough and this is been going on for about 2 weeks. He had fever initially. With the coughing because of his prostatectomy for prostate cancer he does have some leakage and drainage with coughing because of incontinence.      Patient Active Problem List   Diagnosis Date Noted  . Thrombocytopenia ([place]) 04/05/2015  . Abdominal aortic atherosclerosis ([place]) 11/25/2014  . Metabolic syndrome 97/41/6384  . DOE (dyspnea on exertion) 08/11/2013  . 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 POLYCYTHEMIA 11/03/2008  . LIVER FUNCTION TESTS, ABNORMAL, HX OF 09/24/2008  . Hyperlipidemia 07/28/2008  . GOUT, UNSPECIFIED 07/28/2008  . HTN (hypertension) 07/28/2008  . Regional enteritis ([place]) 07/28/2008  . DIVERTICULOSIS, COLON 07/28/2008  . PROSTATE CANCER, PERSONAL HX 07/28/2008  . SMALL BOWEL OBSTRUCTION, HX OF 07/28/2008  . NEPHROLITHIASIS, HX OF 07/28/2008   Outpatient Encounter Prescriptions as of 08/22/2015  Medication Sig  . allopurinol (ZYLOPRIM) 100 MG tablet TAKE 2 TABLETS DAILY AS DIRECTED  . aspirin 81 MG EC tablet Take 81 mg by mouth daily.    Marland Kitchen  atorvastatin (LIPITOR) 40 MG tablet TAKE 1 TABLET EVERY DAY AS DIRECTED  . Cholecalciferol (VITAMIN D3) 1000 UNITS CAPS Take 2 capsules by mouth daily.   . colchicine (COLCRYS) 0.6 MG tablet Take 1 tablet (0.6 mg total) by mouth daily.  Marland Kitchen gemfibrozil (LOPID) 600 MG tablet TAKE 1 TABLET BY MOUTH EVERY DAY AS DIRECTED  . hydrochlorothiazide (HYDRODIURIL) 12.5 MG tablet Take 1 tablet (12.5 mg total) by mouth daily. As directed  . HYDROmorphone (DILAUDID) 2 MG tablet Take 2 mg by mouth every 6 (six) hours as needed for severe pain.  Marland Kitchen KLOR-CON M20 20 MEQ tablet TAKE 1 TAB TWICE DAILY ON MON, WED AND FRIDAY-DAILY EVERY OTHER DAY  . losartan (COZAAR) 100 MG tablet Take 0.5 tablets (50 mg total) by mouth daily. As directed  . OMEGA-3 1000 MG CAPS Take 2 capsules by mouth 2 (two) times daily.  . [DISCONTINUED] KLOR-CON M20 20 MEQ tablet TAKE 1 TAB TWICE DAILY ON MON, WED AND FRIDAY-DAILY EVERY OTHER DAY   No facility-administered encounter medications on file as of 08/22/2015.      Review of Systems  Constitutional: Negative.   HENT: Positive for congestion.   Eyes: Negative.   Respiratory: Positive for cough.   Cardiovascular: Negative.   Gastrointestinal: Negative.   Endocrine: Negative.   Genitourinary: Negative.   Musculoskeletal: Negative.   Skin: Negative.   Allergic/Immunologic: Negative.   Neurological: Negative.   Hematological: Negative.   Psychiatric/Behavioral: Negative.        Objective:   Physical Exam  Constitutional: He is oriented to person, place, and time.  He appears well-developed and well-nourished. No distress.  The patient is pleasant and alert and obviously has nasal congestion.  HENT:  Head: Normocephalic and atraumatic.  Right Ear: External ear normal.  Left Ear: External ear normal.  Mouth/Throat: No oropharyngeal exudate.  Nasal turbinate congestion bilaterally left greater than right and throat red posteriorly.  Eyes: Conjunctivae and EOM are normal.  Pupils are equal, round, and reactive to light. Right eye exhibits no discharge. Left eye exhibits no discharge. No scleral icterus.  Neck: Normal range of motion. Neck supple. No thyromegaly present.  No anterior cervical adenopathy  Cardiovascular: Normal rate, regular rhythm, normal heart sounds and intact distal pulses.  Exam reveals no gallop and no friction rub.   No murmur heard. At 84/m with a regular rate and rhythm  Pulmonary/Chest: Effort normal and breath sounds normal. No respiratory distress. He has no wheezes. He has no rales. He exhibits no tenderness.  Minimal upper airway congestion with coughing with a slightly tight cough and no rales or rhonchi or wheezes  Abdominal: Soft. Bowel sounds are normal. He exhibits no mass. There is no tenderness. There is no rebound and no guarding.  No tenderness no inguinal adenopathy no liver or spleen enlargement  Genitourinary: Rectum normal and penis normal.  The rectal exam was negative for masses. There were no inguinal hernias palpated. The external genitalia were within normal limits. The prostate vault was empty.  Musculoskeletal: Normal range of motion. He exhibits no edema.  Lymphadenopathy:    He has no cervical adenopathy.  Neurological: He is alert and oriented to person, place, and time. He has normal reflexes. No cranial nerve deficit.  Skin: Skin is warm and dry. Rash noted.  The patient does have an inguinal rash bilaterally and he has in the past used Mycolog for this  Psychiatric: He has a normal mood and affect. His behavior is normal. Judgment and thought content normal.  Nursing note and vitals reviewed.  BP 96/62 mmHg  Pulse 82  Temp(Src) 97.5 F (36.4 C) (Oral)  Ht 5' 8.5" (1.74 m)  Wt 236 lb (107.049 kg)  BMI 35.36 kg/m2  [facility] reading (PRIMARY) by  Dr.Lakethia Coppess-chest x-ray- no active disease                                                                        Assessment & Plan:  1. Hyperlipidemia -The  patient should continue with current treatment pending results of lab work - [employer] 2 View; Future - CBC with Differential/Platelet - NMR, lipoprofile  2. Essential hypertension -The blood pressure today is slightly decreased but there will be no change in treatment because he does have this cold and congestion. - DG Chest 2 View; Future - BMP8+EGFR - CBC with Differential/Platelet - Hepatic function panel  3. Metabolic syndrome -He will continue to work on his weight with exercise and diet - BMP8+EGFR - CBC with Differential/Platelet  4. Vitamin D deficiency -Continue current treatment pending results of lab work - CBC with Differential/Platelet - Vit D  25 hydroxy (rtn osteoporosis monitoring)  5. Prostate cancer -The prostate cancer was about 10 years ago and his only issues now are that he has occasional incontinence especially now with coughing and congestion -  CBC with Differential/Platelet - PSA  6. Thrombocytopenia ([place]) -There've been no problems with bleeding or increased bruising. - CBC with Differential/Platelet  7. Abdominal aortic atherosclerosis ([place]) -Continue to follow-up aggressive therapeutic lifestyle changes and take statin drug - DG Chest 2 View; Future - CBC with Differential/Platelet  8. Annual physical exam -He will be given an FOBT to return. -He indicates that he is due soon for his annual screening for his Crohn's disease with a colonoscopy but he has not heard about this at this point in time. From his gastroenterologist. - DG Chest 2 View; Future - BMP8+EGFR - CBC with Differential/Platelet - Hepatic function panel - NMR, lipoprofile - Vit D  25 hydroxy (rtn osteoporosis monitoring) - PSA  9. PROSTATE CANCER, PERSONAL HX -The only problems with having had the prostate cancer is that he does have occasional incontinence.  10. Rhinosinusitis -Drink plenty of fluids, use nasal saline as directed and take Mucinex twice daily with a large  glass of water - amoxicillin-clavulanate (AUGMENTIN) 875-125 MG tablet; Take 1 tablet by mouth 2 (two) times daily.  Dispense: 20 tablet; Refill: 0  11. Cough -Take Mucinex as directed - amoxicillin-clavulanate (AUGMENTIN) 875-125 MG tablet; Take 1 tablet by mouth 2 (two) times daily.  Dispense: 20 tablet; Refill: 0  Meds ordered this encounter  Medications  . amoxicillin-clavulanate (AUGMENTIN) 875-125 MG tablet    Sig: Take 1 tablet by mouth 2 (two) times daily.    Dispense:  20 tablet    Refill:  0   We will also refill his Mycolog cream  Patient Instructions                       Medicare Annual Wellness Visit  [facility] and the medical providers at [facility] strive to bring you the best medical care.  In doing so we not only want to address your current medical conditions and concerns but also to detect new conditions early and prevent illness, disease and health-related problems.    Medicare offers a yearly Wellness Visit which allows our clinical staff to assess your need for preventative services including immunizations, lifestyle education, counseling to decrease risk of preventable diseases and screening for fall risk and other medical concerns.    This visit is provided free of charge (no copay) for all Medicare recipients. The clinical pharmacists at [facility] have begun to conduct these Wellness Visits which will also include a thorough review of all your medications.    As you primary medical provider recommend that you make an appointment for your Annual Wellness Visit if you have not done so already this year.  You may set up this appointment before you leave today or you may call back ([phone]) and schedule an appointment.  Please make sure when you call that you mention that you are scheduling your Annual Wellness Visit with the clinical pharmacist so that the appointment may be made for the proper length of time.      Continue current medications. Continue good therapeutic lifestyle changes which include good diet and exercise. Fall precautions discussed with patient. If an FOBT was given today- please return it to our front desk. If you are over 66 years old - you may need Prevnar 97 or the adult Pneumonia vaccine.  **Flu shots are available--- please call and schedule a FLU-CLINIC appointment**  After your visit with [place] today you will receive a survey in the mail or online from  [employer] regarding your care with [place]. Please take a moment to fill this out. Your feedback is very important to [place] as you can help [place] better understand your patient needs as well as improve your experience and satisfaction. WE CARE ABOUT YOU!!!   The patient will take his antibiotic as directed He will use nasal saline for nasal congestion frequently in each nostril through the day He will take Mucinex, blue and white in color, maximum strength 1 twice daily with a large glass of water He will follow-up with the gastroenterologist as planned for his colonoscopy repeat   Arrie Senate MD

## 2015-08-22 NOTE — Addendum Note (Signed)
Addended by: Zannie Cove on: 08/22/2015 11:06 AM   Modules accepted: Orders

## 2015-08-23 LAB — BMP8+EGFR
BUN / CREAT RATIO: 14 (ref 10–22)
BUN: 19 mg/dL (ref 8–27)
CHLORIDE: 99 mmol/L (ref 97–106)
CO2: 26 mmol/L (ref 18–29)
Calcium: 9.5 mg/dL (ref 8.6–10.2)
Creatinine, Ser: 1.35 mg/dL — ABNORMAL HIGH (ref 0.76–1.27)
GFR calc non Af Amer: 52 mL/min/{1.73_m2} — ABNORMAL LOW (ref >59)
GFR, EST AFRICAN AMERICAN: 61 mL/min/{1.73_m2} (ref >59)
Glucose: 89 mg/dL (ref 65–99)
Potassium: 3.6 mmol/L (ref 3.5–5.2)
Sodium: 140 mmol/L (ref 136–144)

## 2015-08-23 LAB — NMR, LIPOPROFILE
CHOLESTEROL: 138 mg/dL (ref 100–199)
HDL CHOLESTEROL BY NMR: 35 mg/dL — AB (ref >39)
HDL PARTICLE NUMBER: 30.1 umol/L — AB (ref >=30.5)
LDL Particle Number: 1073 nmol/L — ABNORMAL HIGH (ref <1000)
LDL Size: 20.3 nm (ref >20.5)
LDL-C: 69 mg/dL (ref 0–99)
LP-IR Score: 67 — ABNORMAL HIGH (ref <=45)
Small LDL Particle Number: 643 nmol/L — ABNORMAL HIGH (ref <=527)
TRIGLYCERIDES BY NMR: 169 mg/dL — AB (ref 0–149)

## 2015-08-23 LAB — CBC WITH DIFFERENTIAL/PLATELET
Basophils Absolute: 0 10*3/uL (ref 0.0–0.2)
Basos: 0 %
EOS (ABSOLUTE): 0.1 10*3/uL (ref 0.0–0.4)
Eos: 2 %
HEMOGLOBIN: 16.9 g/dL (ref 12.6–17.7)
Hematocrit: 49.2 % (ref 37.5–51.0)
Immature Grans (Abs): 0 10*3/uL (ref 0.0–0.1)
Immature Granulocytes: 1 %
LYMPHS ABS: 2.2 10*3/uL (ref 0.7–3.1)
Lymphs: 25 %
MCH: 32.2 pg (ref 26.6–33.0)
MCHC: 34.3 g/dL (ref 31.5–35.7)
MCV: 94 fL (ref 79–97)
MONOCYTES: 10 %
Monocytes Absolute: 0.8 10*3/uL (ref 0.1–0.9)
NEUTROS ABS: 5.5 10*3/uL (ref 1.4–7.0)
Neutrophils: 62 %
Platelets: 214 10*3/uL (ref 150–379)
RBC: 5.25 x10E6/uL (ref 4.14–5.80)
RDW: 15.2 % (ref 12.3–15.4)
WBC: 8.8 10*3/uL (ref 3.4–10.8)

## 2015-08-23 LAB — HEPATIC FUNCTION PANEL
ALT: 35 IU/L (ref 0–44)
AST: 43 IU/L — ABNORMAL HIGH (ref 0–40)
Albumin: 3.7 g/dL (ref 3.5–4.8)
Alkaline Phosphatase: 62 IU/L (ref 39–117)
Bilirubin Total: 1 mg/dL (ref 0.0–1.2)
Bilirubin, Direct: 0.26 mg/dL (ref 0.00–0.40)
Total Protein: 6.3 g/dL (ref 6.0–8.5)

## 2015-08-23 LAB — VITAMIN D 25 HYDROXY (VIT D DEFICIENCY, FRACTURES): Vit D, 25-Hydroxy: 37.2 ng/mL (ref 30.0–100.0)

## 2015-08-23 LAB — PSA: Prostate Specific Ag, Serum: <0.1 ng/mL (ref 0.0–4.0)

## 2015-08-24 NOTE — Telephone Encounter (Signed)
Spoke with [employer] they are starting process

## 2015-08-29 ENCOUNTER — Other Ambulatory Visit: Payer: Medicare Other

## 2015-08-29 DIAGNOSIS — Z1212 Encounter for screening for malignant neoplasm of rectum: Secondary | ICD-10-CM

## 2015-08-29 NOTE — Progress Notes (Signed)
Lab only 

## 2015-09-01 LAB — FECAL OCCULT BLOOD, IMMUNOCHEMICAL: Fecal Occult Bld: NEGATIVE

## 2015-09-12 ENCOUNTER — Other Ambulatory Visit: Payer: Self-pay | Admitting: Family Medicine

## 2015-09-13 ENCOUNTER — Other Ambulatory Visit: Payer: Self-pay | Admitting: Family Medicine

## 2015-09-19 ENCOUNTER — Ambulatory Visit (INDEPENDENT_AMBULATORY_CARE_PROVIDER_SITE_OTHER): Payer: Medicare Other | Admitting: *Deleted

## 2015-09-19 ENCOUNTER — Ambulatory Visit (INDEPENDENT_AMBULATORY_CARE_PROVIDER_SITE_OTHER): Payer: Medicare Other

## 2015-09-19 ENCOUNTER — Other Ambulatory Visit: Payer: Medicare Other

## 2015-09-19 DIAGNOSIS — R918 Other nonspecific abnormal finding of lung field: Secondary | ICD-10-CM

## 2015-09-19 DIAGNOSIS — Z23 Encounter for immunization: Secondary | ICD-10-CM

## 2015-10-06 ENCOUNTER — Other Ambulatory Visit: Payer: Self-pay | Admitting: Nurse Practitioner

## 2015-12-19 ENCOUNTER — Other Ambulatory Visit: Payer: Self-pay | Admitting: Family Medicine

## 2015-12-26 ENCOUNTER — Other Ambulatory Visit: Payer: Self-pay | Admitting: Family Medicine

## 2016-01-11 ENCOUNTER — Ambulatory Visit (INDEPENDENT_AMBULATORY_CARE_PROVIDER_SITE_OTHER): Payer: Medicare Other | Admitting: Family Medicine

## 2016-01-11 ENCOUNTER — Encounter: Payer: Self-pay | Admitting: Family Medicine

## 2016-01-11 VITALS — BP 118/73 | HR 81 | Temp 97.2°F | Ht 68.5 in | Wt 233.0 lb

## 2016-01-11 DIAGNOSIS — K5090 Crohn's disease, unspecified, without complications: Secondary | ICD-10-CM

## 2016-01-11 DIAGNOSIS — E785 Hyperlipidemia, unspecified: Secondary | ICD-10-CM

## 2016-01-11 DIAGNOSIS — E559 Vitamin D deficiency, unspecified: Secondary | ICD-10-CM

## 2016-01-11 DIAGNOSIS — J301 Allergic rhinitis due to pollen: Secondary | ICD-10-CM | POA: Diagnosis not present

## 2016-01-11 DIAGNOSIS — I10 Essential (primary) hypertension: Secondary | ICD-10-CM

## 2016-01-11 DIAGNOSIS — N400 Enlarged prostate without lower urinary tract symptoms: Secondary | ICD-10-CM | POA: Diagnosis not present

## 2016-01-11 DIAGNOSIS — D696 Thrombocytopenia, unspecified: Secondary | ICD-10-CM

## 2016-01-11 DIAGNOSIS — I700 Atherosclerosis of aorta: Secondary | ICD-10-CM

## 2016-01-11 DIAGNOSIS — E8881 Metabolic syndrome: Secondary | ICD-10-CM

## 2016-01-11 NOTE — Patient Instructions (Addendum)
Medicare Annual Wellness Visit  [facility] and the medical providers at [facility] strive to bring you the best medical care.  In doing so we not only want to address your current medical conditions and concerns but also to detect new conditions early and prevent illness, disease and health-related problems.    Medicare offers a yearly Wellness Visit which allows our clinical staff to assess your need for preventative services including immunizations, lifestyle education, counseling to decrease risk of preventable diseases and screening for fall risk and other medical concerns.    This visit is provided free of charge (no copay) for all Medicare recipients. The clinical pharmacists at [facility] have begun to conduct these Wellness Visits which will also include a thorough review of all your medications.    As you primary medical provider recommend that you make an appointment for your Annual Wellness Visit if you have not done so already this year.  You may set up this appointment before you leave today or you may call back ([phone]) and schedule an appointment.  Please make sure when you call that you mention that you are scheduling your Annual Wellness Visit with the clinical pharmacist so that the appointment may be made for the proper length of time.     Continue current medications. Continue good therapeutic lifestyle changes which include good diet and exercise. Fall precautions discussed with patient. If an FOBT was given today- please return it to our front desk. If you are over 73 years old - you may need Prevnar 71 or the adult Pneumonia vaccine.  **Flu shots are available--- please call and schedule a FLU-CLINIC appointment**  After your visit with [place] today you will receive a survey in the mail or online from [employer] regarding your care with [place]. Please take a moment to fill this out. Your feedback is very  important to [place] as you can help [place] better understand your patient needs as well as improve your experience and satisfaction. WE CARE ABOUT YOU!!!   Continue to follow-up with gastroenterology as planned and don't forget to get your colonoscopy in June. If they did not call you should call them sometime in May to make sure that this gets done Use Flonase nasal spray a little bit more regularly for your allergic rhinitis and continue to take Mucinex for cough and congestion and drink plenty of fluids We'll call with lab work results as soon as those results become available

## 2016-01-11 NOTE — Progress Notes (Signed)
Subjective:    Patient ID: Luis Deleon, male    DOB: [birth year], 75 y.o.   MRN: [id number]  HPI Pt here for follow up and management of chronic medical problems which includes hyperlipidemia and hypertension. He is taking medications regularly.The patient is doing well overall. He does complain today of some cough and cold and congestion. He's been taking Mucinex and this is doing somewhat better. He has a history of Crohn's disease and is followed regularly by the gastroenterologist. His primary gastroenterologist has retired and he will be seeing someone new. He says he is due for colonoscopy this June. He denies any chest pain or shortness of breath. He has no trouble with swallowing heartburn indigestion nausea vomiting or blood in the stool. He does have ongoing loose bowel movements 3 or 4 daily and this is normal for him with his Crohn's disease. As far as his Crohn's disease is concerned this seems to be doing stable for the past few years. Every 3-4 months he'll have a severe episode of upper abdominal pain that lasts as long as 8 hours at a time. During this time he does not eat anything and he takes medication he's been given by the gastroenterologist and this gradually gets better. There are no more frequent episodes of this than in the past. He is passing his water without problems. If he does not hear from the gastroenterologist by May he will call their office and make sure that he gets his colonoscopy done at the appropriate time. He will get lab work today. His immunizations are up-to-date. He does not need any refills. The patient is somewhat worried about his wife as a uterine cancer.     Patient Active Problem List   Diagnosis Date Noted  . 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 Abdominal aortic atherosclerosis ([place]) 11/25/2014  . Metabolic syndrome 14/48/1856  . DOE (dyspnea on exertion) 08/11/2013  . 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 POLYCYTHEMIA 11/03/2008  .  LIVER FUNCTION TESTS, ABNORMAL, HX OF 09/24/2008  . Hyperlipidemia 07/28/2008  . GOUT, UNSPECIFIED 07/28/2008  . HTN (hypertension) 07/28/2008  . 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 DIVERTICULOSIS, COLON 07/28/2008  . PROSTATE CANCER, PERSONAL HX 07/28/2008  . SMALL BOWEL OBSTRUCTION, HX OF 07/28/2008  . NEPHROLITHIASIS, HX OF 07/28/2008   Outpatient Encounter Prescriptions as of 01/11/2016  Medication Sig  . allopurinol (ZYLOPRIM) 100 MG tablet TAKE 2 TABLETS DAILY AS DIRECTED  . aspirin 81 MG EC tablet Take 81 mg by mouth daily.    Marland Kitchen atorvastatin (LIPITOR) 40 MG tablet TAKE 1 TABLET EVERY DAY AS DIRECTED  . Cholecalciferol (VITAMIN D3) 1000 UNITS CAPS Take 2 capsules by mouth daily.   . colchicine (COLCRYS) 0.6 MG tablet Take 1 tablet (0.6 mg total) by mouth daily.  Marland Kitchen gemfibrozil (LOPID) 600 MG tablet TAKE 1 TABLET BY MOUTH EVERY DAY AS DIRECTED  . hydrochlorothiazide (HYDRODIURIL) 12.5 MG tablet Take 1 tablet (12.5 mg total) by mouth daily. As directed  . HYDROmorphone (DILAUDID) 2 MG tablet Take 2 mg by mouth every 6 (six) hours as needed for severe pain.  Marland Kitchen KLOR-CON M20 20 MEQ tablet TAKE 1 TAB TWICE DAILY ON MON, WED AND FRIDAY-DAILY EVERY OTHER DAY  . losartan (COZAAR) 100 MG tablet TAKE 1 TABLET BY MOUTH EVERY DAY AS DIRECTED  . nystatin-triamcinolone (MYCOLOG II) cream Apply 1 application topically 2 (two) times daily.  . OMEGA-3 1000 MG CAPS Take 2 capsules by mouth 2 (two) times daily.  . [DISCONTINUED]  amoxicillin-clavulanate (AUGMENTIN) 875-125 MG tablet Take 1 tablet by mouth 2 (two) times daily.  . [DISCONTINUED] hydrochlorothiazide (MICROZIDE) 12.5 MG capsule TAKE ONE CAPSULE BY MOUTH EVERY DAY AS DIRECTED  . [DISCONTINUED] KLOR-CON M20 20 MEQ tablet TAKE 1 TAB TWICE DAILY ON MON, WED AND FRIDAY-DAILY EVERY OTHER DAY   No facility-administered encounter medications on file as of 01/11/2016.     Review of Systems  Constitutional: Negative.   HENT: Positive for  congestion.   Eyes: Negative.   Respiratory: Positive for cough.   Cardiovascular: Negative.   Gastrointestinal: Negative.   Endocrine: Negative.   Genitourinary: Negative.   Musculoskeletal: Negative.   Skin: Negative.   Allergic/Immunologic: Negative.   Neurological: Negative.   Hematological: Negative.   Psychiatric/Behavioral: Negative.        Objective:   Physical Exam  Constitutional: He is oriented to person, place, and time. He appears well-developed and well-nourished. No distress.  HENT:  Head: Normocephalic and atraumatic.  Right Ear: External ear normal.  Left Ear: External ear normal.  Mouth/Throat: Oropharynx is clear and moist. No oropharyngeal exudate.  Nasal congestion and turbinate swelling bilaterally  Eyes: Conjunctivae and EOM are normal. Pupils are equal, round, and reactive to light. Right eye exhibits no discharge. Left eye exhibits no discharge. No scleral icterus.  Neck: Normal range of motion. Neck supple. No thyromegaly present.  Cardiovascular: Normal rate, regular rhythm, normal heart sounds and intact distal pulses.   No murmur heard. Heart is regular at 72/m  Pulmonary/Chest: Effort normal and breath sounds normal. No respiratory distress. He has no wheezes. He has no rales. He exhibits no tenderness.  Clear anteriorly and posteriorly  Abdominal: Soft. Bowel sounds are normal. He exhibits no mass. There is no tenderness. There is no rebound and no guarding.  Abdominal obesity without liver or spleen enlargement with normal bowel sounds  Musculoskeletal: Normal range of motion. He exhibits no edema or tenderness.  Lymphadenopathy:    He has no cervical adenopathy.  Neurological: He is alert and oriented to person, place, and time. He has normal reflexes. No cranial nerve deficit.  Skin: Skin is warm and dry. No rash noted.  Psychiatric: He has a normal mood and affect. His behavior is normal. Judgment and thought content normal.  Nursing note and  vitals reviewed.  BP 118/73 mmHg  Pulse 81  Temp(Src) 97.2 F (36.2 C) (Oral)  Ht 5' 8.5" (1.74 m)  Wt 233 lb (105.688 kg)  BMI 34.91 kg/m2        Assessment & Plan:  1. Hyperlipidemia -Continue current treatment pending results of lab work. Continue therapeutic lifestyle changes - CBC with Differential/Platelet - NMR, lipoprofile  2. Essential hypertension -The blood pressure is good today and he will continue with current treatment - BMP8+EGFR - CBC with Differential/Platelet - Hepatic function panel  3. Vitamin D deficiency -Continue vitamin D replacement - CBC with Differential/Platelet - VITAMIN D 25 Hydroxy (Vit-D Deficiency, Fractures)  4. Metabolic syndrome -Continue aggressive therapeutic lifestyle changes with all efforts to reduce weight - CBC with Differential/Platelet  5. Prostate cancer -Continue yearly rectal exams with PSAs as directed by the urologist - CBC with Differential/Platelet  6. Thrombocytopenia ([place]) -The patient is had no bleeding issues  7. Crohn's disease without complication, unspecified gastrointestinal tract location [facility]) -Continue follow-up with gastroenterology and colonoscopy this summer  8. Abdominal aortic atherosclerosis ([place]) -Continue with aggressive therapeutic lifestyle changes  9. Allergic rhinitis due to pollen -Allergen avoidance and Flonase 1-2 sprays each  nostril at bedtime  No orders of the defined types were placed in this encounter.   A prescription for Flonase will be called in for the patient to use 1-2 sprays each nostril at bedtime  Patient Instructions                       Medicare Annual Wellness Visit  [facility] and the medical providers at [facility] strive to bring you the best medical care.  In doing so we not only want to address your current medical conditions and concerns but also to detect new conditions early and prevent illness, disease and health-related problems.     Medicare offers a yearly Wellness Visit which allows our clinical staff to assess your need for preventative services including immunizations, lifestyle education, counseling to decrease risk of preventable diseases and screening for fall risk and other medical concerns.    This visit is provided free of charge (no copay) for all Medicare recipients. The clinical pharmacists at [facility] have begun to conduct these Wellness Visits which will also include a thorough review of all your medications.    As you primary medical provider recommend that you make an appointment for your Annual Wellness Visit if you have not done so already this year.  You may set up this appointment before you leave today or you may call back ([phone]) and schedule an appointment.  Please make sure when you call that you mention that you are scheduling your Annual Wellness Visit with the clinical pharmacist so that the appointment may be made for the proper length of time.     Continue current medications. Continue good therapeutic lifestyle changes which include good diet and exercise. Fall precautions discussed with patient. If an FOBT was given today- please return it to our front desk. If you are over 74 years old - you may need Prevnar 15 or the adult Pneumonia vaccine.  **Flu shots are available--- please call and schedule a FLU-CLINIC appointment**  After your visit with [place] today you will receive a survey in the mail or online from [employer] regarding your care with [place]. Please take a moment to fill this out. Your feedback is very important to [place] as you can help [place] better understand your patient needs as well as improve your experience and satisfaction. WE CARE ABOUT YOU!!!   Continue to follow-up with gastroenterology as planned and don't forget to get your colonoscopy in June. If they did not call you should call them sometime in May to make sure that this gets done Use Flonase nasal  spray a little bit more regularly for your allergic rhinitis and continue to take Mucinex for cough and congestion and drink plenty of fluids We'll call with lab work results as soon as those results become available   Arrie Senate MD

## 2016-01-12 ENCOUNTER — Other Ambulatory Visit: Payer: Self-pay | Admitting: *Deleted

## 2016-01-12 DIAGNOSIS — R748 Abnormal levels of other serum enzymes: Secondary | ICD-10-CM

## 2016-01-12 LAB — CBC WITH DIFFERENTIAL/PLATELET
BASOS: 0 %
Basophils Absolute: 0 10*3/uL (ref 0.0–0.2)
EOS (ABSOLUTE): 0.2 10*3/uL (ref 0.0–0.4)
EOS: 3 %
HEMATOCRIT: 50.4 % (ref 37.5–51.0)
Hemoglobin: 17 g/dL (ref 12.6–17.7)
IMMATURE GRANULOCYTES: 0 %
Immature Grans (Abs): 0 10*3/uL (ref 0.0–0.1)
LYMPHS ABS: 1.6 10*3/uL (ref 0.7–3.1)
Lymphs: 24 %
MCH: 31.4 pg (ref 26.6–33.0)
MCHC: 33.7 g/dL (ref 31.5–35.7)
MCV: 93 fL (ref 79–97)
MONOS ABS: 0.7 10*3/uL (ref 0.1–0.9)
Monocytes: 11 %
Neutrophils Absolute: 4.2 10*3/uL (ref 1.4–7.0)
Neutrophils: 62 %
PLATELETS: 160 10*3/uL (ref 150–379)
RBC: 5.41 x10E6/uL (ref 4.14–5.80)
RDW: 15.6 % — AB (ref 12.3–15.4)
WBC: 6.8 10*3/uL (ref 3.4–10.8)

## 2016-01-12 LAB — NMR, LIPOPROFILE
Cholesterol: 134 mg/dL (ref 100–199)
HDL Cholesterol by NMR: 36 mg/dL — ABNORMAL LOW (ref >39)
HDL PARTICLE NUMBER: 26.1 umol/L — AB (ref >=30.5)
LDL Particle Number: 1043 nmol/L — ABNORMAL HIGH (ref <1000)
LDL SIZE: 20.3 nm (ref >20.5)
LDL-C: 62 mg/dL (ref 0–99)
LP-IR Score: 57 — ABNORMAL HIGH (ref <=45)
SMALL LDL PARTICLE NUMBER: 600 nmol/L — AB (ref <=527)
TRIGLYCERIDES BY NMR: 180 mg/dL — AB (ref 0–149)

## 2016-01-12 LAB — BMP8+EGFR
BUN / CREAT RATIO: 12 (ref 10–22)
BUN: 15 mg/dL (ref 8–27)
CO2: 23 mmol/L (ref 18–29)
CREATININE: 1.21 mg/dL (ref 0.76–1.27)
Calcium: 9.2 mg/dL (ref 8.6–10.2)
Chloride: 102 mmol/L (ref 96–106)
GFR, EST AFRICAN AMERICAN: 69 mL/min/{1.73_m2} (ref >59)
GFR, EST NON AFRICAN AMERICAN: 59 mL/min/{1.73_m2} — AB (ref >59)
Glucose: 92 mg/dL (ref 65–99)
Potassium: 3.6 mmol/L (ref 3.5–5.2)
SODIUM: 142 mmol/L (ref 134–144)

## 2016-01-12 LAB — HEPATIC FUNCTION PANEL
ALT: 29 IU/L (ref 0–44)
AST: 37 IU/L (ref 0–40)
Albumin: 3.8 g/dL (ref 3.5–4.8)
Alkaline Phosphatase: 68 IU/L (ref 39–117)
BILIRUBIN TOTAL: 1.6 mg/dL — AB (ref 0.0–1.2)
BILIRUBIN, DIRECT: 0.32 mg/dL (ref 0.00–0.40)
Total Protein: 6.6 g/dL (ref 6.0–8.5)

## 2016-01-12 LAB — VITAMIN D 25 HYDROXY (VIT D DEFICIENCY, FRACTURES): Vit D, 25-Hydroxy: 34.2 ng/mL (ref 30.0–100.0)

## 2016-02-09 ENCOUNTER — Other Ambulatory Visit: Payer: Medicare Other

## 2016-02-09 DIAGNOSIS — R748 Abnormal levels of other serum enzymes: Secondary | ICD-10-CM | POA: Diagnosis not present

## 2016-02-10 LAB — HEPATIC FUNCTION PANEL
ALBUMIN: 4 g/dL (ref 3.5–4.8)
ALK PHOS: 64 IU/L (ref 39–117)
ALT: 46 IU/L — ABNORMAL HIGH (ref 0–44)
AST: 46 IU/L — AB (ref 0–40)
BILIRUBIN TOTAL: 1.4 mg/dL — AB (ref 0.0–1.2)
Bilirubin, Direct: 0.3 mg/dL (ref 0.00–0.40)
Total Protein: 6.6 g/dL (ref 6.0–8.5)

## 2016-02-15 ENCOUNTER — Other Ambulatory Visit (HOSPITAL_COMMUNITY): Payer: Self-pay

## 2016-02-16 ENCOUNTER — Ambulatory Visit (HOSPITAL_COMMUNITY)
Admission: RE | Admit: 2016-02-16 | Discharge: 2016-02-16 | Disposition: A | Discharge status: Home / Self Care | Payer: Medicare Other | Source: Ambulatory Visit | Attending: Family Medicine | Admitting: Family Medicine

## 2016-02-16 DIAGNOSIS — Q6102 Congenital multiple renal cysts: Secondary | ICD-10-CM | POA: Diagnosis not present

## 2016-02-16 DIAGNOSIS — R748 Abnormal levels of other serum enzymes: Secondary | ICD-10-CM | POA: Insufficient documentation

## 2016-02-20 ENCOUNTER — Encounter: Payer: Self-pay | Admitting: Gastroenterology

## 2016-02-22 ENCOUNTER — Other Ambulatory Visit: Payer: Self-pay | Admitting: Family Medicine

## 2016-02-29 ENCOUNTER — Encounter: Payer: Self-pay | Admitting: Family Medicine

## 2016-02-29 ENCOUNTER — Ambulatory Visit (INDEPENDENT_AMBULATORY_CARE_PROVIDER_SITE_OTHER): Payer: Medicare Other | Admitting: Family Medicine

## 2016-02-29 VITALS — BP 110/59 | HR 95 | Temp 99.9°F | Ht 68.5 in | Wt 232.0 lb

## 2016-02-29 DIAGNOSIS — J209 Acute bronchitis, unspecified: Secondary | ICD-10-CM | POA: Diagnosis not present

## 2016-02-29 MED ORDER — AZITHROMYCIN 250 MG PO TABS
ORAL_TABLET | ORAL | Status: DC
Start: 2016-02-29 — End: 2016-05-28
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
## 2016-03-22 ENCOUNTER — Other Ambulatory Visit: Payer: Self-pay | Admitting: Family Medicine

## 2016-03-23 ENCOUNTER — Other Ambulatory Visit: Payer: Self-pay | Admitting: *Deleted

## 2016-03-23 ENCOUNTER — Other Ambulatory Visit: Payer: Self-pay | Admitting: Family Medicine

## 2016-03-23 MED ORDER — HYDROCHLOROTHIAZIDE 12.5 MG PO TABS: 12.5000 mg | ORAL_TABLET | Freq: Every day | ORAL | Status: DC

## 2016-04-21 ENCOUNTER — Other Ambulatory Visit: Payer: Self-pay | Admitting: Family Medicine

## 2016-05-16 ENCOUNTER — Ambulatory Visit: Payer: Medicare Other | Admitting: Pharmacist

## 2016-05-28 ENCOUNTER — Encounter: Payer: Self-pay | Admitting: Family Medicine

## 2016-05-28 ENCOUNTER — Ambulatory Visit (INDEPENDENT_AMBULATORY_CARE_PROVIDER_SITE_OTHER): Payer: Medicare Other | Admitting: Family Medicine

## 2016-05-28 VITALS — BP 118/68 | HR 73 | Temp 98.3°F | Ht 68.5 in | Wt 235.0 lb

## 2016-05-28 DIAGNOSIS — N400 Enlarged prostate without lower urinary tract symptoms: Secondary | ICD-10-CM

## 2016-05-28 DIAGNOSIS — E559 Vitamin D deficiency, unspecified: Secondary | ICD-10-CM | POA: Diagnosis not present

## 2016-05-28 DIAGNOSIS — I700 Atherosclerosis of aorta: Secondary | ICD-10-CM | POA: Diagnosis not present

## 2016-05-28 DIAGNOSIS — J301 Allergic rhinitis due to pollen: Secondary | ICD-10-CM | POA: Diagnosis not present

## 2016-05-28 DIAGNOSIS — Z1211 Encounter for screening for malignant neoplasm of colon: Secondary | ICD-10-CM | POA: Diagnosis not present

## 2016-05-28 DIAGNOSIS — E785 Hyperlipidemia, unspecified: Secondary | ICD-10-CM | POA: Diagnosis not present

## 2016-05-28 DIAGNOSIS — E8881 Metabolic syndrome: Secondary | ICD-10-CM

## 2016-05-28 DIAGNOSIS — I10 Essential (primary) hypertension: Secondary | ICD-10-CM

## 2016-05-28 DIAGNOSIS — K5090 Crohn's disease, unspecified, without complications: Secondary | ICD-10-CM

## 2016-05-28 DIAGNOSIS — H6123 Impacted cerumen, bilateral: Secondary | ICD-10-CM | POA: Diagnosis not present

## 2016-05-28 DIAGNOSIS — D696 Thrombocytopenia, unspecified: Secondary | ICD-10-CM | POA: Diagnosis not present

## 2016-05-28 NOTE — Patient Instructions (Addendum)
Medicare Annual Wellness Visit  [facility] and the medical providers at [facility] strive to bring you the best medical care.  In doing so we not only want to address your current medical conditions and concerns but also to detect new conditions early and prevent illness, disease and health-related problems.    Medicare offers a yearly Wellness Visit which allows our clinical staff to assess your need for preventative services including immunizations, lifestyle education, counseling to decrease risk of preventable diseases and screening for fall risk and other medical concerns.    This visit is provided free of charge (no copay) for all Medicare recipients. The clinical pharmacists at [facility] have begun to conduct these Wellness Visits which will also include a thorough review of all your medications.    As you primary medical provider recommend that you make an appointment for your Annual Wellness Visit if you have not done so already this year.  You may set up this appointment before you leave today or you may call back ([phone]) and schedule an appointment.  Please make sure when you call that you mention that you are scheduling your Annual Wellness Visit with the clinical pharmacist so that the appointment may be made for the proper length of time.     Continue current medications. Continue good therapeutic lifestyle changes which include good diet and exercise. Fall precautions discussed with patient. If an FOBT was given today- please return it to our front desk. If you are over 60 years old - you may need Prevnar 17 or the adult Pneumonia vaccine.   After your visit with [place] today you will receive a survey in the mail or online from [employer] regarding your care with [place]. Please take a moment to fill this out. Your feedback is very important to [place] as you can help [place] better understand your patient needs as well as  improve your experience and satisfaction. WE CARE ABOUT YOU!!!   Use Flonase more regularly 2 sprays each nostril at bedtime Follow-up with gastroenterology for colonoscopy as planned Do not forget to get eye exam Periodic use of Debrox especially prior to your visit with [place] will help soften the earwax so that we can be more successful with removing it with irrigation

## 2016-05-28 NOTE — Progress Notes (Signed)
Subjective:    Patient ID: Luis Deleon, male    DOB: 11-04-1940, 75 y.o.   MRN: [id number]  HPI Pt here for follow up and management of chronic medical problems which includes hyperlipidemia. He is taking medications regularly.This patient has a history of hypertension hyperlipidemia and metabolic syndrome. 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 Because of his regional enteritis he is periodically seen by gastroenterology. He has no specific complaints today. He is due to have his colonoscopy and we will urge him to follow through with this. The patient denies any chest pain shortness of breath palpitations chest tightness. His also behind on his eye exam. He is due to get a colonoscopy and his former gastroenterologist has retired. We will make sure that we get this process started for him. He continues to have watery stools which are normal for him and may have as many as 2-3 daily. He has not seen any blood in the stool or had any black tarry bowel movements. He has minimal abdominal pain. The stools are associated with his regional enteritis. He's passing his water without problems. The patient does have some incontinence secondary to his radical prostatectomy.     Patient Active Problem List   Diagnosis Date Noted  . Thrombocytopenia ([place]) 04/05/2015  . Abdominal aortic atherosclerosis ([place]) 11/25/2014  . Metabolic syndrome 62/22/9798  . DOE (dyspnea on exertion) 08/11/2013  . 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 POLYCYTHEMIA 11/03/2008  . LIVER FUNCTION TESTS, ABNORMAL, HX OF 09/24/2008  . Hyperlipidemia 07/28/2008  . GOUT, UNSPECIFIED 07/28/2008  . HTN (hypertension) 07/28/2008  . 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 DIVERTICULOSIS, COLON 07/28/2008  . Prostate cancer ([place]) 07/28/2008  . SMALL BOWEL OBSTRUCTION, HX OF 07/28/2008  . NEPHROLITHIASIS, HX OF 07/28/2008   Outpatient Encounter Prescriptions as  of 05/28/2016  Medication Sig  . allopurinol (ZYLOPRIM) 100 MG tablet TAKE 2 TABLETS DAILY AS DIRECTED  . aspirin 81 MG EC tablet Take 81 mg by mouth daily.    Marland Kitchen atorvastatin (LIPITOR) 40 MG tablet TAKE 1 TABLET EVERY DAY AS DIRECTED  . atorvastatin (LIPITOR) 40 MG tablet TAKE 1 TABLET EVERY DAY AS DIRECTED  . Cholecalciferol (VITAMIN D3) 1000 UNITS CAPS Take 2 capsules by mouth daily.   Marland Kitchen gemfibrozil (LOPID) 600 MG tablet TAKE 1 TABLET BY MOUTH EVERY DAY AS DIRECTED  . hydrochlorothiazide (HYDRODIURIL) 12.5 MG tablet Take 1 tablet (12.5 mg total) by mouth daily. As directed  . KLOR-CON M20 20 MEQ tablet TAKE 1 TAB TWICE DAILY ON MON, WED AND FRIDAY-DAILY EVERY OTHER DAY  . losartan (COZAAR) 100 MG tablet TAKE 1 TABLET BY MOUTH EVERY DAY AS DIRECTED  . Multiple Vitamins-Minerals (CENTRUM SILVER ADULT 50+ PO) Take 1 tablet by mouth.  . nystatin-triamcinolone (MYCOLOG II) cream Apply 1 application topically 2 (two) times daily.  . OMEGA-3 1000 MG CAPS Take 2 capsules by mouth 2 (two) times daily.  . [DISCONTINUED] azithromycin (ZITHROMAX) 250 MG tablet Take 2 the first day and then one each day after.  Marland Kitchen HYDROmorphone (DILAUDID) 2 MG tablet Take 2 mg by mouth every 6 (six) hours as needed for severe pain.   No facility-administered encounter medications on file as of 05/28/2016.      Review of Systems  Constitutional: Negative.   HENT: Negative.   Eyes: Negative.   Respiratory: Negative.   Cardiovascular: Negative.   Gastrointestinal: Negative.   Endocrine: Negative.   Genitourinary: Negative.   Musculoskeletal: Negative.  Skin: Negative.   Allergic/Immunologic: Negative.   Neurological: Negative.   Hematological: Negative.   Psychiatric/Behavioral: Negative.        Objective:   Physical Exam  Constitutional: He is oriented to person, place, and time. He appears well-developed and well-nourished. No distress.  The patient was alert and pleasant  HENT:  Head: Normocephalic and  atraumatic.  Nose: Nose normal.  Mouth/Throat: Oropharynx is clear and moist. No oropharyngeal exudate.  Bilateral ear cerumen and nasal turbinate congestion bilaterally right greater than left  Eyes: Conjunctivae and EOM are normal. Pupils are equal, round, and reactive to light. Right eye exhibits no discharge. Left eye exhibits no discharge. No scleral icterus.  Neck: Normal range of motion. Neck supple. No thyromegaly present.  No bruits thyromegaly or anterior cervical adenopathy  Cardiovascular: Normal rate, regular rhythm, normal heart sounds and intact distal pulses.   No murmur heard. The heart has a regular rate and rhythm at 72/m  Pulmonary/Chest: Effort normal and breath sounds normal. No respiratory distress. He has no wheezes. He has no rales. He exhibits no tenderness.  Clear anteriorly and posteriorly and no axillary adenopathy  Abdominal: Soft. Bowel sounds are normal. He exhibits no mass. There is no tenderness. There is no rebound and no guarding.  Abdominal obesity without masses tenderness or organ enlargement  Musculoskeletal: Normal range of motion. He exhibits no edema.  Lymphadenopathy:    He has no cervical adenopathy.  Neurological: He is alert and oriented to person, place, and time. He has normal reflexes. No cranial nerve deficit.  Skin: Skin is warm and dry. No rash noted.  Psychiatric: He has a normal mood and affect. His behavior is normal. Judgment and thought content normal.  Nursing note and vitals reviewed.   BP 118/68 (BP Location: Left Arm)   Pulse 73   Temp 98.3 F (36.8 C) (Oral)   Ht 5' 8.5" (1.74 m)   Wt 235 lb (106.6 kg)   BMI 35.21 kg/m        Assessment & Plan:  1. Hyperlipidemia -Continue current treatment pending results of lab work - CBC with Differential/Platelet - NMR, lipoprofile  2. Essential hypertension -The blood pressure is good today and he will continue with current treatment - BMP8+EGFR - CBC with  Differential/Platelet - Hepatic function panel  3. Vitamin D deficiency -Continue current treatment pending results of lab work - CBC with Differential/Platelet - VITAMIN D 25 Hydroxy (Vit-D Deficiency, Fractures)  4. Prostate cancer -The patient has had a radical prostatectomy and continues to get rectal exams yearly and does have incontinence with this. - CBC with Differential/Platelet  5. Metabolic syndrome -Continue with aggressive therapeutic lifestyle changes - CBC with Differential/Platelet  6. Thrombocytopenia ([facility]) -No bleeding issues - CBC with Differential/Platelet  7. Special screening for malignant neoplasms, colon -We will make sure that he is scheduled for a visit with the gastroenterologist especially since his previous GI doctor retired - Ambulatory referral to Gastroenterology  8. Crohn's disease without complication, unspecified gastrointestinal tract location [facility]) -Follow-up with gastroenterology  9. Abdominal aortic atherosclerosis ([place]) -Continue aggressive therapeutic lifestyle changes and cholesterol treatment  10. Impacted cerumen of both ears -Ear irrigation today  11. Allergic rhinitis due to pollen -Use Flonase more regularly and avoid irritating environments  Patient Instructions                       Medicare Annual Wellness Visit  [place] and the medical providers at [facility] strive to bring you the best medical care.  In doing so we not only want to address your current medical conditions and concerns but also to detect new conditions early and prevent illness, disease and health-related problems.    Medicare offers a yearly Wellness Visit which allows our clinical staff to assess your need for preventative services including immunizations, lifestyle education, counseling to decrease risk of preventable diseases and screening for fall risk and other medical concerns.    This visit is provided free of charge (no  copay) for all Medicare recipients. The clinical pharmacists at [facility] have begun to conduct these Wellness Visits which will also include a thorough review of all your medications.    As you primary medical provider recommend that you make an appointment for your Annual Wellness Visit if you have not done so already this year.  You may set up this appointment before you leave today or you may call back ([phone]) and schedule an appointment.  Please make sure when you call that you mention that you are scheduling your Annual Wellness Visit with the clinical pharmacist so that the appointment may be made for the proper length of time.     Continue current medications. Continue good therapeutic lifestyle changes which include good diet and exercise. Fall precautions discussed with patient. If an FOBT was given today- please return it to our front desk. If you are over 56 years old - you may need Prevnar 38 or the adult Pneumonia vaccine.   After your visit with [place] today you will receive a survey in the mail or online from [employer] regarding your care with [place]. Please take a moment to fill this out. Your feedback is very important to [place] as you can help [place] better understand your patient needs as well as improve your experience and satisfaction. WE CARE ABOUT YOU!!!   Use Flonase more regularly 2 sprays each nostril at bedtime Follow-up with gastroenterology for colonoscopy as planned Do not forget to get eye exam Periodic use of Debrox especially prior to your visit with [place] will help soften the earwax so that we can be more successful with removing it with irrigation    Arrie Senate MD

## 2016-05-29 LAB — NMR, LIPOPROFILE
CHOLESTEROL: 128 mg/dL (ref 100–199)
HDL Cholesterol by NMR: 35 mg/dL — ABNORMAL LOW (ref >39)
HDL Particle Number: 26.6 umol/L — ABNORMAL LOW (ref >=30.5)
LDL PARTICLE NUMBER: 854 nmol/L (ref <1000)
LDL Size: 20.5 nm (ref >20.5)
LDL-C: 57 mg/dL (ref 0–99)
LP-IR Score: 72 — ABNORMAL HIGH (ref <=45)
Small LDL Particle Number: 366 nmol/L (ref <=527)
Triglycerides by NMR: 182 mg/dL — ABNORMAL HIGH (ref 0–149)

## 2016-05-29 LAB — CBC WITH DIFFERENTIAL/PLATELET
BASOS ABS: 0 10*3/uL (ref 0.0–0.2)
Basos: 0 %
EOS (ABSOLUTE): 0.1 10*3/uL (ref 0.0–0.4)
Eos: 2 %
HEMOGLOBIN: 16.3 g/dL (ref 12.6–17.7)
Hematocrit: 48.1 % (ref 37.5–51.0)
Immature Grans (Abs): 0 10*3/uL (ref 0.0–0.1)
Immature Granulocytes: 0 %
LYMPHS ABS: 1.7 10*3/uL (ref 0.7–3.1)
Lymphs: 25 %
MCH: 31.5 pg (ref 26.6–33.0)
MCHC: 33.9 g/dL (ref 31.5–35.7)
MCV: 93 fL (ref 79–97)
MONOS ABS: 0.7 10*3/uL (ref 0.1–0.9)
Monocytes: 10 %
NEUTROS ABS: 4.3 10*3/uL (ref 1.4–7.0)
Neutrophils: 63 %
PLATELETS: 159 10*3/uL (ref 150–379)
RBC: 5.17 x10E6/uL (ref 4.14–5.80)
RDW: 14.9 % (ref 12.3–15.4)
WBC: 6.8 10*3/uL (ref 3.4–10.8)

## 2016-05-29 LAB — BMP8+EGFR
BUN / CREAT RATIO: 9 — AB (ref 10–24)
BUN: 13 mg/dL (ref 8–27)
CHLORIDE: 102 mmol/L (ref 96–106)
CO2: 23 mmol/L (ref 18–29)
Calcium: 9 mg/dL (ref 8.6–10.2)
Creatinine, Ser: 1.4 mg/dL — ABNORMAL HIGH (ref 0.76–1.27)
GFR calc non Af Amer: 50 mL/min/{1.73_m2} — ABNORMAL LOW (ref >59)
GFR, EST AFRICAN AMERICAN: 58 mL/min/{1.73_m2} — AB (ref >59)
Glucose: 92 mg/dL (ref 65–99)
POTASSIUM: 3.7 mmol/L (ref 3.5–5.2)
SODIUM: 143 mmol/L (ref 134–144)

## 2016-05-29 LAB — HEPATIC FUNCTION PANEL
ALK PHOS: 60 IU/L (ref 39–117)
ALT: 35 IU/L (ref 0–44)
AST: 34 IU/L (ref 0–40)
Albumin: 3.7 g/dL (ref 3.5–4.8)
Bilirubin Total: 1.2 mg/dL (ref 0.0–1.2)
Bilirubin, Direct: 0.26 mg/dL (ref 0.00–0.40)
TOTAL PROTEIN: 6.4 g/dL (ref 6.0–8.5)

## 2016-05-29 LAB — VITAMIN D 25 HYDROXY (VIT D DEFICIENCY, FRACTURES): Vit D, 25-Hydroxy: 38.1 ng/mL (ref 30.0–100.0)

## 2016-05-30 ENCOUNTER — Telehealth: Payer: Self-pay | Admitting: Internal Medicine

## 2016-05-30 NOTE — Telephone Encounter (Signed)
ok 

## 2016-05-30 NOTE — Telephone Encounter (Signed)
Pt requesting to see Dr. Hilarie Fredrickson. Previous pt of Dr. Olevia Perches. States his PCP recommended Dr. Hilarie Fredrickson. Will you accept the pt? Please advise.

## 2016-05-31 NOTE — Telephone Encounter (Signed)
Dr. Hilarie Fredrickson agreed to see pt. See note below.

## 2016-06-01 NOTE — Telephone Encounter (Signed)
Patient has been scheduled for follow up appointment with Dr.Pyrtle.

## 2016-06-04 ENCOUNTER — Encounter: Payer: Self-pay | Admitting: *Deleted

## 2016-06-07 ENCOUNTER — Other Ambulatory Visit: Payer: Self-pay | Admitting: Internal Medicine

## 2016-06-07 ENCOUNTER — Telehealth: Payer: Self-pay | Admitting: *Deleted

## 2016-06-07 ENCOUNTER — Encounter: Payer: Self-pay | Admitting: Internal Medicine

## 2016-06-07 ENCOUNTER — Other Ambulatory Visit (INDEPENDENT_AMBULATORY_CARE_PROVIDER_SITE_OTHER): Payer: Medicare Other

## 2016-06-07 ENCOUNTER — Ambulatory Visit (INDEPENDENT_AMBULATORY_CARE_PROVIDER_SITE_OTHER): Payer: Medicare Other | Admitting: Internal Medicine

## 2016-06-07 VITALS — BP 120/70 | HR 74 | Ht 68.0 in | Wt 234.0 lb

## 2016-06-07 DIAGNOSIS — K6389 Other specified diseases of intestine: Secondary | ICD-10-CM

## 2016-06-07 DIAGNOSIS — K5000 Crohn's disease of small intestine without complications: Secondary | ICD-10-CM

## 2016-06-07 DIAGNOSIS — K5669 Other intestinal obstruction: Secondary | ICD-10-CM | POA: Diagnosis not present

## 2016-06-07 DIAGNOSIS — R195 Other fecal abnormalities: Secondary | ICD-10-CM | POA: Diagnosis not present

## 2016-06-07 DIAGNOSIS — K56600 Partial intestinal obstruction, unspecified as to cause: Secondary | ICD-10-CM

## 2016-06-07 DIAGNOSIS — R143 Flatulence: Secondary | ICD-10-CM | POA: Diagnosis not present

## 2016-06-07 LAB — HIGH SENSITIVITY CRP: CRP, High Sensitivity: 0.61 mg/L (ref 0.000–5.000)

## 2016-06-07 NOTE — Patient Instructions (Addendum)
Your physician has requested that you go to the basement for the following lab work before leaving today: Fecal calprotectin, CRP  Please follow a low residue diet.  We will contact you about an appointment for Hydrogen Breath Test at [facility] have been scheduled for an MR enterography at [facility]iology (1st floor) on Wednesday 06/20/16. Your appointment time is 9:00 am. Please arrive at 7:30 am to your appointment time for registration purposes. Please make certain not to have anything to eat or drink 4 hours prior to your test. In addition, if you have any metal in your body, have a pacemaker or defibrillator, please be sure to let your ordering physician know. This test typically takes 45 minutes to 1 hour to complete.  Low-Fiber Diet Fiber is found in fruits, vegetables, and whole grains. A low-fiber diet restricts fibrous foods that are not digested in the small intestine. A diet containing about 10-15 grams of fiber per day is considered low fiber. Low-fiber diets may be used to:  Promote healing and rest the bowel during intestinal flare-ups.  Prevent blockage of a partially obstructed or narrowed gastrointestinal tract.  Reduce fecal weight and volume.  Slow the movement of feces. You may be on a low-fiber diet as a transitional diet following surgery, after an injury (trauma), or because of a short (acute) or lifelong (chronic) illness. Your health care provider will determine the length of time you need to stay on this diet.  WHAT DO I NEED TO KNOW ABOUT A LOW-FIBER DIET? Always check the fiber content on the packaging's Nutrition Facts label, especially on foods from the grains list. Ask your dietitian if you have questions about specific foods that are related to your condition, especially if the food is not listed below. In general, a low-fiber food will have less than 2 g of fiber. WHAT FOODS CAN I EAT? Grains All breads and crackers made with white flour.  Sweet rolls, doughnuts, waffles, pancakes, [place] toast, bagels. Pretzels, Melba toast, zwieback. Well-cooked cereals, such as cornmeal, farina, or cream cereals. Dry cereals that do not contain whole grains, fruit, or nuts, such as refined corn, wheat, rice, and oat cereals. Potatoes prepared any way without skins, plain pastas and noodles, refined white rice. Use white flour for baking and making sauces. Use allowed list of grains for casseroles, dumplings, and puddings.  Vegetables Strained tomato and vegetable juices. Fresh lettuce, cucumber, spinach. Well-cooked (no skin or pulp) or canned vegetables, such as asparagus, bean sprouts, beets, carrots, green beans, mushrooms, potatoes, pumpkin, spinach, yellow squash, tomato sauce/puree, turnips, yams, and zucchini. Keep servings limited to  cup.  Fruits All fruit juices except prune juice. Cooked or canned fruits without skin and seeds, such as applesauce, apricots, cherries, fruit cocktail, grapefruit, grapes, mandarin oranges, melons, peaches, pears, pineapple, and plums. Fresh fruits without skin, such as apricots, avocados, bananas, melons, pineapple, nectarines, and peaches. Keep servings limited to  cup or 1 piece.  Meat and Other Protein Sources Ground or well-cooked tender beef, ham, veal, lamb, pork, or poultry. Eggs, plain cheese. Fish, oysters, shrimp, lobster, and other seafood. Liver, organ meats. Smooth nut butters. Dairy All milk products and alternative dairy substitutes, such as soy, rice, almond, and coconut, not containing added whole nuts, seeds, or added fruit. Beverages Decaf coffee, fruit, and vegetable juices or smoothies (small amounts, with no pulp or skins, and with fruits from allowed list), sports drinks, herbal tea. Condiments Ketchup, mustard, vinegar, cream sauce,  cheese sauce, cocoa powder. Spices in moderation, such as allspice, basil, bay leaves, celery powder or leaves, cinnamon, cumin powder, curry powder,  ginger, mace, marjoram, onion or garlic powder, oregano, paprika, parsley flakes, ground pepper, rosemary, sage, savory, tarragon, thyme, and turmeric. Sweets and Desserts Plain cakes and cookies, pie made with allowed fruit, pudding, custard, cream pie. Gelatin, fruit, ice, sherbet, frozen ice pops. Ice cream, ice milk without nuts. Plain hard candy, honey, jelly, molasses, syrup, sugar, chocolate syrup, gumdrops, marshmallows. Limit overall sugar intake.  Fats and Oil Margarine, butter, cream, mayonnaise, salad oils, plain salad dressings made from allowed foods. Choose healthy fats such as olive oil, canola oil, and omega-3 fatty acids (such as found in salmon or tuna) when possible.  Other Bouillon, broth, or cream soups made from allowed foods. Any strained soup. Casseroles or mixed dishes made with allowed foods. The items listed above may not be a complete list of recommended foods or beverages. Contact your dietitian for more options.  WHAT FOODS ARE NOT RECOMMENDED? Grains All whole wheat and whole grain breads and crackers. Multigrains, rye, bran seeds, nuts, or coconut. Cereals containing whole grains, multigrains, bran, coconut, nuts, raisins. Cooked or dry oatmeal, steel-cut oats. Coarse wheat cereals, granola. Cereals advertised as high fiber. Potato skins. Whole grain pasta, wild or brown rice. Popcorn. Coconut flour. Bran, buckwheat, corn bread, multigrains, rye, wheat germ.  Vegetables Fresh, cooked or canned vegetables, such as artichokes, asparagus, beet greens, broccoli, Brussels sprouts, cabbage, celery, cauliflower, corn, eggplant, kale, legumes or beans, okra, peas, and tomatoes. Avoid large servings of any vegetables, especially raw vegetables.  Fruits Fresh fruits, such as apples with or without skin, berries, cherries, figs, grapes, grapefruit, guavas, kiwis, mangoes, oranges, papayas, pears, persimmons, pineapple, and pomegranate. Prune juice and juices with pulp, stewed or  dried prunes. Dried fruits, dates, raisins. Fruit seeds or skins. Avoid large servings of all fresh fruits. Meats and Other Protein Sources Tough, fibrous meats with gristle. Chunky nut butter. Cheese made with seeds, nuts, or other foods not recommended. Nuts, seeds, legumes (beans, including baked beans), dried peas, beans, lentils.  Dairy Yogurt or cheese that contains nuts, seeds, or added fruit.  Beverages Fruit juices with high pulp, prune juice. Caffeinated coffee and teas.  Condiments Coconut, maple syrup, pickles, olives. Sweets and Desserts Desserts, cookies, or candies that contain nuts or coconut, chunky peanut butter, dried fruits. Jams, preserves with seeds, marmalade. Large amounts of sugar and sweets. Any other dessert made with fruits from the not recommended list.  Other Soups made from vegetables that are not recommended or that contain other foods not recommended.  The items listed above may not be a complete list of foods and beverages to avoid. Contact your dietitian for more information.   This information is not intended to replace advice given to you by your health care provider. Make sure you discuss any questions you have with your health care provider.   Document Released: 03/23/2002 Document Revised: 10/06/2013 Document Reviewed: 08/24/2013 Elsevier Interactive Patient Education [employer].

## 2016-06-07 NOTE — Telephone Encounter (Signed)
I have spoken to patient to advise of hydrogen breath test at [facility] on 06/15/16 @ 7:30 am with 7 am arrival (per Hoyle Sauer at [facility], phone [phone]). I have also advised of prep for test. He verbalizes understanding.

## 2016-06-07 NOTE — Progress Notes (Signed)
Patient ID: Luis Deleon, male   DOB: 20-Oct-1940, 75 y.o.   MRN: [id number] HPI: [name] is a 75 year old male with a history of ileal Crohn's disease status post ileocecectomy in 2003, history of ventral hernia status post repair with mesh, history of intermittent issues with small bowel obstruction who is here for follow-up. He also has a history of fatty liver disease, hyperlipidemia, polycythemia, prostate cancer status post radical prostatectomy, gout and hypertension. He followed with Dr. Olevia Perches and this is his first visit with me. He is a long-standing patient of Dr. Morrie Sheldon for primary care. He was last seen in the office by Dr. Olevia Perches on 10/01/2014.  From a Crohn's disease perspective he was previously treated with 6-MP but this was discontinued around 2014 or 2015 because of reported interactions with his gout treatment. He is currently on no maintenance Crohn's medications. His last colonoscopy was performed on 03/22/2011. This showed a surgical anastomosis in the right colon with "wide open" ileocolonic anastomosis. This was biopsied. There was no evidence of active disease at the neoterminal ileum or in the colon. There was mild diverticulosis in the sigmoid. A diminutive polyp was removed from the rectum. Pathology at that time was benign with no evidence of active inflammation or granulomas. The polyp from the rectum was hyperplastic. He had a CT enterography which I reviewed from 2013 which also was largely unrevealing and did not show evidence of active Crohn's disease at that time.  He reports that on the whole he does very well but every 3-4 months he has an episode of "excruciating" abdominal pain. He describes this as "waves" of cramping pain which "builds" until eventually subsiding. This is not usually associated with nausea or vomiting. In regards to his bowel habits he states he pretty much always has loose stools or diarrhea occurring 3-4 times per day but not at night. Denies  rectal bleeding or melena. He eats a low fiber diet and states that despite attempts at modifying diet, it doesn't seem to affect these episodes of pain. He reports "tremendous" amounts of flatulence and at times he doesn't know whether he will have to pass air or stool. He currently denies nausea, vomiting, dysphagia or odynophagia. He does not use tobacco or alcohol. He had a tobacco history but quit in 1992. He is a retired Curator. He lives in [place].  Past Medical History:  Diagnosis Date  . Abnormal LFTs (liver function tests)   . Arthritis   . Crohn disease ([place])   . Diverticulosis   . Fatty liver   . Gout   . Hepatic cyst   . Hyperlipemia   . Hyperplastic colon polyp 2012  . Hypertension   . Nephrolithiasis   . Polycythemia   . Prostate cancer ([place])    8 or 9 years  . Small bowel obstruction ([place]) 2003  . Vitamin D deficiency     Past Surgical History:  Procedure Laterality Date  . APPENDECTOMY  2003  . EXPLORATORY LAPAROTOMY W/ BOWEL RESECTION  2003  . LITHOTRIPSY  1980  . PROSTATECTOMY    . TONSILLECTOMY    . VENTRAL HERNIA REPAIR     2 times    Outpatient Medications Prior to Visit  Medication Sig Dispense Refill  . allopurinol (ZYLOPRIM) 100 MG tablet TAKE 2 TABLETS DAILY AS DIRECTED 60 tablet 5  . aspirin 81 MG EC tablet Take 81 mg by mouth daily.      Marland Kitchen atorvastatin (LIPITOR)  40 MG tablet TAKE 1 TABLET EVERY DAY AS DIRECTED 30 tablet 5  . atorvastatin (LIPITOR) 40 MG tablet TAKE 1 TABLET EVERY DAY AS DIRECTED 30 tablet 2  . Cholecalciferol (VITAMIN D3) 1000 UNITS CAPS Take 2 capsules by mouth daily.     Marland Kitchen gemfibrozil (LOPID) 600 MG tablet TAKE 1 TABLET BY MOUTH EVERY DAY AS DIRECTED 30 tablet 5  . hydrochlorothiazide (HYDRODIURIL) 12.5 MG tablet Take 1 tablet (12.5 mg total) by mouth daily. As directed 90 tablet 3  . HYDROmorphone (DILAUDID) 2 MG tablet Take 2 mg by mouth every 6 (six) hours as needed for severe pain.    Marland Kitchen  KLOR-CON M20 20 MEQ tablet TAKE 1 TAB TWICE DAILY ON MON, WED AND FRIDAY-DAILY EVERY OTHER DAY 40 tablet 5  . losartan (COZAAR) 100 MG tablet TAKE 1 TABLET BY MOUTH EVERY DAY AS DIRECTED 90 tablet 0  . Multiple Vitamins-Minerals (CENTRUM SILVER ADULT 50+ PO) Take 1 tablet by mouth.    . nystatin-triamcinolone (MYCOLOG II) cream Apply 1 application topically 2 (two) times daily. 30 g 3  . OMEGA-3 1000 MG CAPS Take 2 capsules by mouth 2 (two) times daily.     No facility-administered medications prior to visit.     Allergies  Allergen Reactions  . Colchicine     REACTION: nausea, diarrhea and interactions with 61m    Family History  Problem Relation Age of Onset  . Heart disease Mother   . Cervical cancer Mother   . Heart disease Father   . Heart failure Father   . Dislocations Daughter 8    Bilateral knee  . Heart disease Maternal Uncle   . Colon cancer Neg Hx     Social History  Substance Use Topics  . Smoking status: Former Smoker    Packs/day: 1.00    Years: 38.00    Types: Cigarettes    Quit date: 03/19/1991  . Smokeless tobacco: Current User    Types: Chew  . Alcohol use No    ROS: As per history of present illness, otherwise negative  BP 120/70   Pulse 74   Ht 5' 8"  (1.727 m)   Wt 234 lb (106.1 kg)   BMI 35.58 kg/m  Constitutional: Well-developed and well-nourished. No distress. HEENT: Normocephalic and atraumatic. Oropharynx is clear and moist. No oropharyngeal exudate. Conjunctivae are normal.  No scleral icterus. Neck: Neck supple. Trachea midline. Cardiovascular: Normal rate, regular rhythm and intact distal pulses. No M/R/G Pulmonary/chest: Effort normal and breath sounds normal. No wheezing, rales or rhonchi. Abdominal: Soft, obese, tenderness at umbilicus without mass, nondistended. Bowel sounds active throughout.  Extremities: no clubbing, cyanosis, or edema Lymphadenopathy: No cervical adenopathy noted. Neurological: Alert and oriented to person  place and time. Skin: Skin is warm and dry. No rashes noted. Psychiatric: Normal mood and affect. Behavior is normal.  RELEVANT LABS AND IMAGING: CBC    Component Value Date/Time   WBC 6.8 05/28/2016 0949   WBC 6.9 04/05/2015 0946   WBC 7.0 01/07/2013 1057   WBC 7.8 03/02/2011 1738   RBC 5.17 05/28/2016 0949   RBC 5.72 04/05/2015 0946   RBC 5.29 01/07/2013 1057   RBC 5.55 03/02/2011 1738   HGB 17.1 04/05/2015 0946   HGB 16.8 01/07/2013 1057   HCT 48.1 05/28/2016 0949   HCT 48.1 01/07/2013 1057   PLT 159 05/28/2016 0949   MCV 93 05/28/2016 0949   MCV 90.9 01/07/2013 1057   MCH 31.5 05/28/2016 0949   MCH 29.9  04/05/2015 0946   MCH 31.8 01/07/2013 1057   MCHC 33.9 05/28/2016 0949   MCHC 32.1 04/05/2015 0946   MCHC 34.9 01/07/2013 1057   MCHC 34.7 03/02/2011 1738   RDW 14.9 05/28/2016 0949   RDW 14.7 (H) 01/07/2013 1057   LYMPHSABS 1.7 05/28/2016 0949   LYMPHSABS 1.4 01/07/2013 1057   MONOABS 0.8 01/07/2013 1057   EOSABS 0.1 05/28/2016 0949   BASOSABS 0.0 05/28/2016 0949   BASOSABS 0.0 01/07/2013 1057    CMP     Component Value Date/Time   NA 143 05/28/2016 0949   K 3.7 05/28/2016 0949   CL 102 05/28/2016 0949   CO2 23 05/28/2016 0949   GLUCOSE 92 05/28/2016 0949   GLUCOSE 89 09/02/2013 0818   BUN 13 05/28/2016 0949   CREATININE 1.40 (H) 05/28/2016 0949   CREATININE 1.56 (H) 09/02/2013 0818   CALCIUM 9.0 05/28/2016 0949   PROT 6.4 05/28/2016 0949   ALBUMIN 3.7 05/28/2016 0949   AST 34 05/28/2016 0949   ALT 35 05/28/2016 0949   ALKPHOS 60 05/28/2016 0949   BILITOT 1.2 05/28/2016 0949   GFRNONAA 50 (L) 05/28/2016 0949   GFRNONAA 45 (L) 09/02/2013 0818   GFRAA 58 (L) 05/28/2016 0949   GFRAA 52 (L) 09/02/2013 0818   CT ABDOMEN AND PELVIS WITH CONTRAST (CT ENTEROGRAPHY)   Technique:  Multidetector CT of the abdomen and pelvis during bolus administration of intravenous contrast. Negative oral contrast VoLumen was given.   Contrast: 18m OMNIPAQUE IOHEXOL  300 MG/ML IJ SOLN   Comparison:   CT 03/06/2011.   Findings: Multiple low-density lesions within the central liver which appear benign.  One lesion has contracted l measuring 9 mm (image 22) compared to 15 mm on prior.  No new hepatic lesions. The gallbladder, pancreas, spleen, adrenal glands are normal. There multiple bilateral renal cysts which have simple fluid attenuation.   The stomach and duodenum are normal.  The small bowel is mildly distended by the negative oral contrast.  There is no mucosal irregularity of the jejunum or ileum.  There is no evidence of stricture or obstruction.  There is an enteric colonic anastomoses at the ascending colon.  No evidence of inflammation of the neoterminal ileum.   No evidence of fistula or abscess.  There are several diverticula in the descending colon and sigmoid colon.  No acute inflammation.  There is a ventral abdominal wall mesh.  There is some laxity along the anterior abdominal wall but no frank herniation.   Abdominal aorta normal caliber.  No retroperitoneal or pelvic lymphadenopathy.  No free fluid the pelvis.  Prior prostatectomy. Bladder is normal.  No pelvic lymphadenopathy. Review of  bone windows demonstrates no aggressive osseous lesions.   IMPRESSION:   1.  No evidence of small bowel inflammation or obstruction.  No mucosal irregularity.  No evidence of inflammation or stricture at the neo terminal ileum. 2.  Ventral abdominal mesh without frank herniation.  No evidence of small bowel obstruction. 3.  Sigmoid diverticulosis without diverticulitis. 4.  Multiple hepatic cysts and renal cysts are essentially stable.   Original Report Authenticated By: SSuzy Bouchard M.D.  ASSESSMENT/PLAN: 75year old male with a history of ileal Crohn's disease status post ileocecectomy in 2003, history of ventral hernia status post repair with mesh, history of intermittent issues with small bowel obstruction who is here for  follow-up  1. History of ileal Crohn's/intermittent abdominal pain/loose stools/flatulence -- he has a history of ileal Crohn's disease though at last imaging and  endoscopic surveillance he did not have evidence of active Crohn's disease. He is not currently on maintenance therapy. The attacks of abdominal pain which he describes are most consistent with partial or transient small bowel obstructions. These seem to self resolve and occur despite low residue diet. Differential for this includes active Crohn's disease, fixed stricture due to Crohn's disease, or adhesive disease due to prior abdominal surgeries. If this is due to active Crohn's disease medication may be able to control these attacks. I have recommended a hydrogen breath test to evaluate for bacterial overgrowth which he is at risk for due to prior surgery at the IC valve. This test will be ordered at [facility]. I've also recommended a fecal calprotectin and a CRP today. I've advised a low residue diet. We will perform MR enterography to evaluate for stricturing disease and active Crohn's disease. I will see him back after we complete this evaluation to discuss possible therapy and when it would be appropriate to repeat colonoscopy. He's had recent blood counts with Dr. Laurance Flatten which are normal, this is reassuring. His liver enzymes are normal and he has mild renal insufficiency. He had a fecal occult blood test which was negative in November 2016.  40 minutes spent with the patient today. Greater than 50% was spent in counseling and coordination of care with the patient     GY:IRSWNI [address], [place], [place] [address]

## 2016-06-13 ENCOUNTER — Other Ambulatory Visit: Payer: Medicare Other

## 2016-06-13 DIAGNOSIS — R143 Flatulence: Secondary | ICD-10-CM

## 2016-06-15 ENCOUNTER — Encounter (HOSPITAL_COMMUNITY)
Admission: RE | Disposition: A | Discharge status: Home / Self Care | Payer: Self-pay | Source: Ambulatory Visit | Attending: Internal Medicine

## 2016-06-15 ENCOUNTER — Ambulatory Visit (HOSPITAL_COMMUNITY)
Admission: RE | Admit: 2016-06-15 | Discharge: 2016-06-15 | Disposition: A | Discharge status: Home / Self Care | Payer: Medicare Other | Source: Ambulatory Visit | Attending: Internal Medicine | Admitting: Internal Medicine

## 2016-06-15 DIAGNOSIS — K6389 Other specified diseases of intestine: Secondary | ICD-10-CM

## 2016-06-15 DIAGNOSIS — K598 Other specified functional intestinal disorders: Secondary | ICD-10-CM | POA: Insufficient documentation

## 2016-06-15 DIAGNOSIS — R140 Abdominal distension (gaseous): Secondary | ICD-10-CM | POA: Diagnosis present

## 2016-06-15 HISTORY — PX: BACTERIAL OVERGROWTH TEST: SHX5739

## 2016-06-15 SURGERY — BREATH TEST, FOR INTESTINAL BACTERIAL OVERGROWTH

## 2016-06-15 MED ORDER — LACTULOSE 10 GM/15ML PO SOLN
ORAL | Status: AC
  Administered 2016-06-15: 25 g via ORAL
  Filled 2016-06-15: qty 60

## 2016-06-15 MED ORDER — LACTULOSE 10 GM/15ML PO SOLN
25.0000 g | Freq: Once | ORAL | Status: AC
  Administered 2016-06-15: 25 g via ORAL

## 2016-06-15 NOTE — Progress Notes (Signed)
No beans, bran or high fiber cereal the day before the procedure? yes NPO except for water 12 hours before procedure? yes No smoking, sleeping or vigorous exercising for at least 30 before procedure? yes Recent antibiotic use and/or diarrhea? yes   If yes, physician notified.  Time Baseline 15 mins 30 mins 45 mins 60 mins 75 mins 90 mins 105 mins 120 mins 135 mins 150 mins 165 mins 180 mins  H2-ppm     4      3      2       3      7      10     14      17        33       42       34      45     63

## 2016-06-20 ENCOUNTER — Ambulatory Visit (HOSPITAL_COMMUNITY)
Admission: RE | Admit: 2016-06-20 | Discharge: 2016-06-20 | Disposition: A | Discharge status: Home / Self Care | Payer: Medicare Other | Source: Ambulatory Visit | Attending: Internal Medicine | Admitting: Internal Medicine

## 2016-06-20 DIAGNOSIS — K5669 Other intestinal obstruction: Secondary | ICD-10-CM | POA: Insufficient documentation

## 2016-06-20 DIAGNOSIS — K5090 Crohn's disease, unspecified, without complications: Secondary | ICD-10-CM | POA: Diagnosis not present

## 2016-06-20 DIAGNOSIS — N281 Cyst of kidney, acquired: Secondary | ICD-10-CM | POA: Insufficient documentation

## 2016-06-20 DIAGNOSIS — R143 Flatulence: Secondary | ICD-10-CM

## 2016-06-20 DIAGNOSIS — Z9889 Other specified postprocedural states: Secondary | ICD-10-CM | POA: Diagnosis not present

## 2016-06-20 DIAGNOSIS — K5289 Other specified noninfective gastroenteritis and colitis: Secondary | ICD-10-CM | POA: Insufficient documentation

## 2016-06-20 LAB — CALPROTECTIN, FECAL

## 2016-06-20 MED ORDER — GADOBENATE DIMEGLUMINE 529 MG/ML IV SOLN
20.0000 mL | Freq: Once | INTRAVENOUS | Status: AC | PRN
  Administered 2016-06-20: 20 mL via INTRAVENOUS

## 2016-06-20 MED ORDER — BARIUM SULFATE 0.1 % PO SUSP
ORAL | Status: AC
  Filled 2016-06-20: qty 3

## 2016-06-20 MED ORDER — GLUCAGON HCL RDNA (DIAGNOSTIC) 1 MG IJ SOLR
0.5000 mg | Freq: Once | INTRAMUSCULAR | Status: AC
  Administered 2016-06-20: 0.5 mg via INTRAVENOUS

## 2016-06-20 MED ORDER — GLUCAGON HCL RDNA (DIAGNOSTIC) 1 MG IJ SOLR
0.5000 mg | Freq: Once | INTRAMUSCULAR | Status: AC
Start: 2016-06-20 — End: 2016-06-20
  Administered 2016-06-20: 0.5 mg via INTRAVENOUS

## 2016-06-20 MED ORDER — GLUCAGON HCL RDNA (DIAGNOSTIC) 1 MG IJ SOLR
INTRAMUSCULAR | Status: AC
  Filled 2016-06-20: qty 1

## 2016-06-21 ENCOUNTER — Other Ambulatory Visit: Payer: Self-pay

## 2016-06-21 MED ORDER — AMOXICILLIN-POT CLAVULANATE 875-125 MG PO TABS: 1.0000 | ORAL_TABLET | Freq: Two times a day (BID) | ORAL | 0 refills | Status: DC

## 2016-06-21 NOTE — Progress Notes (Signed)
Hydrogen breath test --Positive for bacterial overgrowth with rise greater than 20 ppm in the first 2 hours --We'll treat with antibiotics given symptoms of bloating and flatulence

## 2016-06-22 ENCOUNTER — Encounter (HOSPITAL_COMMUNITY): Payer: Self-pay

## 2016-07-16 ENCOUNTER — Other Ambulatory Visit: Payer: Self-pay | Admitting: Family Medicine

## 2016-07-27 ENCOUNTER — Other Ambulatory Visit: Payer: Self-pay | Admitting: Family Medicine

## 2016-07-31 ENCOUNTER — Ambulatory Visit (INDEPENDENT_AMBULATORY_CARE_PROVIDER_SITE_OTHER): Payer: Medicare Other

## 2016-07-31 ENCOUNTER — Other Ambulatory Visit (INDEPENDENT_AMBULATORY_CARE_PROVIDER_SITE_OTHER): Payer: Medicare Other

## 2016-07-31 ENCOUNTER — Other Ambulatory Visit: Payer: Self-pay | Admitting: *Deleted

## 2016-07-31 DIAGNOSIS — R7989 Other specified abnormal findings of blood chemistry: Secondary | ICD-10-CM | POA: Diagnosis not present

## 2016-07-31 DIAGNOSIS — Z23 Encounter for immunization: Secondary | ICD-10-CM

## 2016-08-01 LAB — BMP8+EGFR
BUN/Creatinine Ratio: 13 (ref 10–24)
BUN: 18 mg/dL (ref 8–27)
CALCIUM: 9.3 mg/dL (ref 8.6–10.2)
CO2: 24 mmol/L (ref 18–29)
Chloride: 103 mmol/L (ref 96–106)
Creatinine, Ser: 1.43 mg/dL — ABNORMAL HIGH (ref 0.76–1.27)
GFR, EST AFRICAN AMERICAN: 56 mL/min/{1.73_m2} — AB (ref >59)
GFR, EST NON AFRICAN AMERICAN: 49 mL/min/{1.73_m2} — AB (ref >59)
Glucose: 84 mg/dL (ref 65–99)
Potassium: 3.7 mmol/L (ref 3.5–5.2)
Sodium: 143 mmol/L (ref 134–144)

## 2016-08-07 ENCOUNTER — Ambulatory Visit (INDEPENDENT_AMBULATORY_CARE_PROVIDER_SITE_OTHER): Payer: Medicare Other | Admitting: Internal Medicine

## 2016-08-07 ENCOUNTER — Encounter: Payer: Self-pay | Admitting: Internal Medicine

## 2016-08-07 VITALS — BP 112/76 | HR 60 | Ht 68.0 in | Wt 238.0 lb

## 2016-08-07 DIAGNOSIS — K565 Intestinal adhesions [bands], unspecified as to partial versus complete obstruction: Secondary | ICD-10-CM | POA: Diagnosis not present

## 2016-08-07 DIAGNOSIS — K6389 Other specified diseases of intestine: Secondary | ICD-10-CM

## 2016-08-07 DIAGNOSIS — Z8719 Personal history of other diseases of the digestive system: Secondary | ICD-10-CM | POA: Diagnosis not present

## 2016-08-07 MED ORDER — HYOSCYAMINE SULFATE 0.125 MG SL SUBL: 0.2500 mg | SUBLINGUAL_TABLET | Freq: Four times a day (QID) | SUBLINGUAL | 2 refills | Status: DC | PRN

## 2016-08-07 MED ORDER — HYDROMORPHONE HCL 2 MG PO TABS: 2.0000 mg | ORAL_TABLET | ORAL | 0 refills | Status: DC | PRN

## 2016-08-07 NOTE — Progress Notes (Signed)
Subjective:    Patient ID: Luis Deleon, male    DOB: 06-10-1941, 75 y.o.   MRN: [id number]  HPI Luis Deleon is a 75 year old male with a history of ileal Crohn's disease status post ileocecectomy in 2003, history of ventral hernia status post repair with mesh and history of intermittent issues with partial small bowel obstructions who is here for follow-up. He also has a history of fatty liver disease, hyperlipidemia, polycythemia, prostate cancer status post prostatectomy, hypertension and gout.  After his last visit we perform hydrogen breath testing which was positive for small intestinal bacterial overgrowth. He was treated with Augmentin 875 mg twice a day 7 days. He reports that this has significant early helped his abdominal bloating and gas. He has not had any further abdominal pain.  He also specifically has not had any episodes of the "excruciating" abdominal pain attacks that tend to last about 10 hours. He feels like he is "due for one". Bowel movements have been regular without blood or melena. Appetite has remained good. He eats a low fiber diet.  Of note his last colonoscopy was performed on 03/22/2011 which showed patent ileocolonic anastomosis which was widely open. There is no evidence of active Crohn's disease in the neoterminal ileum or colon. There was mild sigmoid diverticulosis. A hyperplastic polyp was removed from the rectum. On review of his records I do not see any prior history of adenomatous colon polyps.  I also performed an MR enterography to evaluate for active Crohn's disease and rule out stricture. Fortunately this was normal and did not show evidence of active Crohn's disease in the small bowel or colon without stricture  Review of Systems As per history of present illness, otherwise negative  Current Medications, Allergies, Past Medical History, Past Surgical History, Family History and Social History were reviewed in [employer]  record.     Objective:   Physical Exam BP 112/76   Pulse 60   Ht 5' 8"  (1.727 m)   Wt 238 lb (108 kg)   BMI 36.19 kg/m  Constitutional: Well-developed and well-nourished. No distress. HEENT: Normocephalic and atraumatic. Marland Kitchen Conjunctivae are normal.  No scleral icterus. Neck: Neck supple. Trachea midline. Cardiovascular: Normal rate, regular rhythm and intact distal pulses.  Pulmonary/chest: Effort normal and breath sounds normal. No wheezing, rales or rhonchi. Abdominal: Soft, nontender, nondistended. Bowel sounds active throughout.  Extremities: no clubbing, cyanosis, or edema Neurological: Alert and oriented to person place and time. Skin: Skin is warm and dry.  Psychiatric: Normal mood and affect. Behavior is normal.    CLINICAL DATA:  Abdominal pain for over 8 years. History Crohn disease. Multiple abdominal surgeries. Small bowel obstruction. Prostatectomy.   EXAM: MR ABDOMEN AND PELVIS WITHOUT AND WITH CONTRAST (MR ENTEROGRAPHY)   TECHNIQUE: Multiplanar, multisequence MRI of the abdomen and pelvis was performed both before and during bolus administration of intravenous contrast. Negative oral contrast VoLumen was given.   CONTRAST:  51m MULTIHANCE GADOBENATE DIMEGLUMINE 529 MG/ML IV SOLN   COMPARISON:  02/16/2016 abdominal ultrasound.  CT of 12/21/2011.   FINDINGS: COMBINED FINDINGS FOR BOTH MR ABDOMEN AND PELVIS   Lower chest: Right hemidiaphragm elevation. Normal heart size without pericardial or pleural effusion.   Hepatobiliary: Tiny hepatic cysts. Normal gallbladder, without biliary ductal dilatation.   Pancreas: Normal, without mass or ductal dilatation.   Spleen: Normal in size, without focal abnormality.   Adrenals/Urinary Tract: Normal adrenal glands. Bilateral large renal cysts. No suspicious renal lesion. Index upper pole  right renal cyst measures 8.1 cm. The largest left-sided lesion is also in the upper pole and measures 6.5 cm. No  hydronephrosis.   Normal urinary bladder.   Stomach/Bowel: Normal stomach. Scattered colonic diverticula. Surgical changes in the region of the terminal ileum. The neo terminal ileum is identified anteriorly, including on images 28/series 6 and 78/series 16. This is normal in appearance. The remainder of the small bowel is moderately well distended with enteric contrast. No evidence of small bowel wall thickening, mucosal hyperenhancement, obstruction, stricture, or mesenteric abnormality.   Vascular/Lymphatic: Aortic and branch vessel atherosclerosis. No abdominopelvic adenopathy.   Reproductive: Prostatectomy, without locally recurrent disease.   Other: No significant free fluid.   Musculoskeletal: No acute osseous abnormality.   IMPRESSION: 1. Surgical changes about the terminal ileum and ileocecal valve. No evidence of active Crohn disease or acute complication. 2. Prostatectomy, without evidence of metastatic or recurrent disease. 3. Large renal cysts without suspicious renal lesion.     Electronically Signed   By: Abigail Miyamoto M.D.   On: 06/20/2016 11:04 CT ABDOMEN AND PELVIS WITH CONTRAST (CT ENTEROGRAPHY)   Technique:  Multidetector CT of the abdomen and pelvis during bolus administration of intravenous contrast. Negative oral contrast VoLumen was given.   Contrast: 133m OMNIPAQUE IOHEXOL 300 MG/ML IJ SOLN   Comparison:   CT 03/06/2011.   Findings: Multiple low-density lesions within the central liver which appear benign.  One lesion has contracted l measuring 9 mm (image 22) compared to 15 mm on prior.  No new hepatic lesions. The gallbladder, pancreas, spleen, adrenal glands are normal. There multiple bilateral renal cysts which have simple fluid attenuation.   The stomach and duodenum are normal.  The small bowel is mildly distended by the negative oral contrast.  There is no mucosal irregularity of the jejunum or ileum.  There is no evidence  of stricture or obstruction.  There is an enteric colonic anastomoses at the ascending colon.  No evidence of inflammation of the neoterminal ileum.   No evidence of fistula or abscess.  There are several diverticula in the descending colon and sigmoid colon.  No acute inflammation.  There is a ventral abdominal wall mesh.  There is some laxity along the anterior abdominal wall but no frank herniation.   Abdominal aorta normal caliber.  No retroperitoneal or pelvic lymphadenopathy.  No free fluid the pelvis.  Prior prostatectomy. Bladder is normal.  No pelvic lymphadenopathy. Review of  bone windows demonstrates no aggressive osseous lesions.   IMPRESSION:   1.  No evidence of small bowel inflammation or obstruction.  No mucosal irregularity.  No evidence of inflammation or stricture at the neo terminal ileum. 2.  Ventral abdominal mesh without frank herniation.  No evidence of small bowel obstruction. 3.  Sigmoid diverticulosis without diverticulitis. 4.  Multiple hepatic cysts and renal cysts are essentially stable.   Original Report Authenticated By: SSuzy Bouchard M.D.  Fecal calprotectin normal High sensitivity CRP normal at 0.610  CBC    Component Value Date/Time   WBC 6.8 05/28/2016 0949   WBC 6.9 04/05/2015 0946   WBC 7.0 01/07/2013 1057   WBC 7.8 03/02/2011 1738   RBC 5.17 05/28/2016 0949   RBC 5.72 04/05/2015 0946   RBC 5.29 01/07/2013 1057   RBC 5.55 03/02/2011 1738   HGB 17.1 04/05/2015 0946   HGB 16.8 01/07/2013 1057   HCT 48.1 05/28/2016 0949   HCT 48.1 01/07/2013 1057   PLT 159 05/28/2016 0949   MCV 93  05/28/2016 0949   MCV 90.9 01/07/2013 1057   MCH 31.5 05/28/2016 0949   MCH 29.9 04/05/2015 0946   MCH 31.8 01/07/2013 1057   MCHC 33.9 05/28/2016 0949   MCHC 32.1 04/05/2015 0946   MCHC 34.9 01/07/2013 1057   MCHC 34.7 03/02/2011 1738   RDW 14.9 05/28/2016 0949   RDW 14.7 (H) 01/07/2013 1057   LYMPHSABS 1.7 05/28/2016 0949   LYMPHSABS 1.4  01/07/2013 1057   MONOABS 0.8 01/07/2013 1057   EOSABS 0.1 05/28/2016 0949   BASOSABS 0.0 05/28/2016 0949   BASOSABS 0.0 01/07/2013 1057       Assessment & Plan:  75 year old male with a history of ileal Crohn's disease status post ileocecectomy in 2003, history of ventral hernia status post repair with mesh and history of intermittent issues with partial small bowel obstructions who is here for follow-up.  1. Ileal Crohn's disease -- in remission clinically and by MR enterography. I do not think his attacks of abdominal pain are related to active IBD. See #2. He is not currently on therapy and at present maintain surgical remission.  2. Intermittent partial small bowel obstructions -- symptoms classic for intermittent partial small bowel obstructions despite low fiber diet. This is felt secondary to abdominal adhesive disease in the setting of prior ileocecectomy and hernia repair with mesh placement. We discussed surgical referral to discuss lysis of adhesions but he currently does not want to pursue additional surgery. He also is the primary caregiver for his wife who has multiple medical problems. We discussed continuing low residue diet. I've also given him a prescription for Levsin 0.25 mg to be used on an as-needed basis when he feels these abdominal attacks began. I will renew prescription for Dilaudid 2 mg to be used every 4 hours on the rare occasion when these attacks develop. If they fail to resolve at home he is instructed to go to the ER. He voices understanding. When they do start I recommend that he reduce diet to clear liquids. If the first dose of Dilaudid 2 mg does not work he can take an additional 2 mg dose within 30 minutes. Hopefully he will not have to use this medication.  3. 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 He may need retreatment in the future. I asked that he notify me should he develop recurrent flatulence and bloating  symptoms.  4. CRC screening -- no history of adenomatous colon polyps and last colonoscopy unremarkable from a polyp perspective. Consider repeat in the 7-10 year range. We can discuss this more in the future.  6 to 9 month follow-up, sooner if necessary 25 minutes spent with the patient today. Greater than 50% was spent in counseling and coordination of care with the patient

## 2016-08-07 NOTE — Patient Instructions (Signed)
Please follow up with Dr Hilarie Fredrickson in 6 months.  Follow a low fiber diet.  We have sent the following medications to your pharmacy for you to pick up at your convenience: Levsin -2 tablets by mouth every 6 hours as needed for abdominal pain  We have given you a prescription of dilaudid 2 mg to take 1 tablet every 4 hours as needed for severe pain. If not better 30 minutes after 1st tablet, you may take 1 additional tablet.  If you are age 73 or older, your body mass index should be between 23-30. Your Body mass index is 36.19 kg/m. If this is out of the aforementioned range listed, please consider follow up with your Primary Care Provider.  If you are age 51 or younger, your body mass index should be between 19-25. Your Body mass index is 36.19 kg/m. If this is out of the aformentioned range listed, please consider follow up with your Primary Care Provider.   Low-Fiber Diet Fiber is found in fruits, vegetables, and whole grains. A low-fiber diet restricts fibrous foods that are not digested in the small intestine. A diet containing about 10-15 grams of fiber per day is considered low fiber. Low-fiber diets may be used to:  Promote healing and rest the bowel during intestinal flare-ups.  Prevent blockage of a partially obstructed or narrowed gastrointestinal tract.  Reduce fecal weight and volume.  Slow the movement of feces. You may be on a low-fiber diet as a transitional diet following surgery, after an injury (trauma), or because of a short (acute) or lifelong (chronic) illness. Your health care provider will determine the length of time you need to stay on this diet.  WHAT DO I NEED TO KNOW ABOUT A LOW-FIBER DIET? Always check the fiber content on the packaging's Nutrition Facts label, especially on foods from the grains list. Ask your dietitian if you have questions about specific foods that are related to your condition, especially if the food is not listed below. In general, a  low-fiber food will have less than 2 g of fiber. WHAT FOODS CAN I EAT? Grains All breads and crackers made with white flour. Sweet rolls, doughnuts, waffles, pancakes, [place] toast, bagels. Pretzels, Melba toast, zwieback. Well-cooked cereals, such as cornmeal, farina, or cream cereals. Dry cereals that do not contain whole grains, fruit, or nuts, such as refined corn, wheat, rice, and oat cereals. Potatoes prepared any way without skins, plain pastas and noodles, refined white rice. Use white flour for baking and making sauces. Use allowed list of grains for casseroles, dumplings, and puddings.  Vegetables Strained tomato and vegetable juices. Fresh lettuce, cucumber, spinach. Well-cooked (no skin or pulp) or canned vegetables, such as asparagus, bean sprouts, beets, carrots, green beans, mushrooms, potatoes, pumpkin, spinach, yellow squash, tomato sauce/puree, turnips, yams, and zucchini. Keep servings limited to  cup.  Fruits All fruit juices except prune juice. Cooked or canned fruits without skin and seeds, such as applesauce, apricots, cherries, fruit cocktail, grapefruit, grapes, mandarin oranges, melons, peaches, pears, pineapple, and plums. Fresh fruits without skin, such as apricots, avocados, bananas, melons, pineapple, nectarines, and peaches. Keep servings limited to  cup or 1 piece.  Meat and Other Protein Sources Ground or well-cooked tender beef, ham, veal, lamb, pork, or poultry. Eggs, plain cheese. Fish, oysters, shrimp, lobster, and other seafood. Liver, organ meats. Smooth nut butters. Dairy All milk products and alternative dairy substitutes, such as soy, rice, almond, and coconut, not containing added whole nuts, seeds, or added  fruit. Beverages Decaf coffee, fruit, and vegetable juices or smoothies (small amounts, with no pulp or skins, and with fruits from allowed list), sports drinks, herbal tea. Condiments Ketchup, mustard, vinegar, cream sauce, cheese sauce, cocoa powder.  Spices in moderation, such as allspice, basil, bay leaves, celery powder or leaves, cinnamon, cumin powder, curry powder, ginger, mace, marjoram, onion or garlic powder, oregano, paprika, parsley flakes, ground pepper, rosemary, sage, savory, tarragon, thyme, and turmeric. Sweets and Desserts Plain cakes and cookies, pie made with allowed fruit, pudding, custard, cream pie. Gelatin, fruit, ice, sherbet, frozen ice pops. Ice cream, ice milk without nuts. Plain hard candy, honey, jelly, molasses, syrup, sugar, chocolate syrup, gumdrops, marshmallows. Limit overall sugar intake.  Fats and Oil Margarine, butter, cream, mayonnaise, salad oils, plain salad dressings made from allowed foods. Choose healthy fats such as olive oil, canola oil, and omega-3 fatty acids (such as found in salmon or tuna) when possible.  Other Bouillon, broth, or cream soups made from allowed foods. Any strained soup. Casseroles or mixed dishes made with allowed foods. The items listed above may not be a complete list of recommended foods or beverages. Contact your dietitian for more options.  WHAT FOODS ARE NOT RECOMMENDED? Grains All whole wheat and whole grain breads and crackers. Multigrains, rye, bran seeds, nuts, or coconut. Cereals containing whole grains, multigrains, bran, coconut, nuts, raisins. Cooked or dry oatmeal, steel-cut oats. Coarse wheat cereals, granola. Cereals advertised as high fiber. Potato skins. Whole grain pasta, wild or brown rice. Popcorn. Coconut flour. Bran, buckwheat, corn bread, multigrains, rye, wheat germ.  Vegetables Fresh, cooked or canned vegetables, such as artichokes, asparagus, beet greens, broccoli, Brussels sprouts, cabbage, celery, cauliflower, corn, eggplant, kale, legumes or beans, okra, peas, and tomatoes. Avoid large servings of any vegetables, especially raw vegetables.  Fruits Fresh fruits, such as apples with or without skin, berries, cherries, figs, grapes, grapefruit, guavas,  kiwis, mangoes, oranges, papayas, pears, persimmons, pineapple, and pomegranate. Prune juice and juices with pulp, stewed or dried prunes. Dried fruits, dates, raisins. Fruit seeds or skins. Avoid large servings of all fresh fruits. Meats and Other Protein Sources Tough, fibrous meats with gristle. Chunky nut butter. Cheese made with seeds, nuts, or other foods not recommended. Nuts, seeds, legumes (beans, including baked beans), dried peas, beans, lentils.  Dairy Yogurt or cheese that contains nuts, seeds, or added fruit.  Beverages Fruit juices with high pulp, prune juice. Caffeinated coffee and teas.  Condiments Coconut, maple syrup, pickles, olives. Sweets and Desserts Desserts, cookies, or candies that contain nuts or coconut, chunky peanut butter, dried fruits. Jams, preserves with seeds, marmalade. Large amounts of sugar and sweets. Any other dessert made with fruits from the not recommended list.  Other Soups made from vegetables that are not recommended or that contain other foods not recommended.  The items listed above may not be a complete list of foods and beverages to avoid. Contact your dietitian for more information.   This information is not intended to replace advice given to you by your health care provider. Make sure you discuss any questions you have with your health care provider.   Document Released: 03/23/2002 Document Revised: 10/06/2013 Document Reviewed: 08/24/2013 Elsevier Interactive Patient Education [employer].

## 2016-08-31 ENCOUNTER — Other Ambulatory Visit: Payer: Self-pay | Admitting: *Deleted

## 2016-08-31 MED ORDER — ATORVASTATIN CALCIUM 40 MG PO TABS: ORAL_TABLET | ORAL | 3 refills | Status: DC

## 2016-09-18 ENCOUNTER — Other Ambulatory Visit: Payer: Self-pay | Admitting: Family Medicine

## 2016-09-20 DIAGNOSIS — X32XXXA Exposure to sunlight, initial encounter: Secondary | ICD-10-CM | POA: Diagnosis not present

## 2016-09-20 DIAGNOSIS — L821 Other seborrheic keratosis: Secondary | ICD-10-CM | POA: Diagnosis not present

## 2016-09-20 DIAGNOSIS — L570 Actinic keratosis: Secondary | ICD-10-CM | POA: Diagnosis not present

## 2016-09-20 DIAGNOSIS — L853 Xerosis cutis: Secondary | ICD-10-CM | POA: Diagnosis not present

## 2016-09-28 ENCOUNTER — Ambulatory Visit (INDEPENDENT_AMBULATORY_CARE_PROVIDER_SITE_OTHER): Payer: Medicare Other | Admitting: Family Medicine

## 2016-09-28 ENCOUNTER — Telehealth: Payer: Self-pay | Admitting: Family Medicine

## 2016-09-28 ENCOUNTER — Encounter: Payer: Self-pay | Admitting: Family Medicine

## 2016-09-28 VITALS — BP 114/69 | HR 79 | Temp 97.0°F | Ht 68.0 in | Wt 232.0 lb

## 2016-09-28 DIAGNOSIS — I10 Essential (primary) hypertension: Secondary | ICD-10-CM

## 2016-09-28 DIAGNOSIS — D696 Thrombocytopenia, unspecified: Secondary | ICD-10-CM

## 2016-09-28 DIAGNOSIS — Z0000 Encounter for general adult medical examination without abnormal findings: Secondary | ICD-10-CM

## 2016-09-28 DIAGNOSIS — E559 Vitamin D deficiency, unspecified: Secondary | ICD-10-CM

## 2016-09-28 DIAGNOSIS — I700 Atherosclerosis of aorta: Secondary | ICD-10-CM

## 2016-09-28 DIAGNOSIS — C61 Malignant neoplasm of prostate: Secondary | ICD-10-CM

## 2016-09-28 DIAGNOSIS — E7800 Pure hypercholesterolemia, unspecified: Secondary | ICD-10-CM | POA: Diagnosis not present

## 2016-09-28 DIAGNOSIS — K5090 Crohn's disease, unspecified, without complications: Secondary | ICD-10-CM

## 2016-09-28 DIAGNOSIS — E8881 Metabolic syndrome: Secondary | ICD-10-CM

## 2016-09-28 LAB — MICROSCOPIC EXAMINATION
BACTERIA UA: NONE SEEN
EPITHELIAL CELLS (NON RENAL): NONE SEEN /HPF (ref 0–10)
RENAL EPITHEL UA: NONE SEEN /HPF
WBC UA: NONE SEEN /HPF (ref 0–?)

## 2016-09-28 LAB — URINALYSIS, COMPLETE
Bilirubin, UA: NEGATIVE
GLUCOSE, UA: NEGATIVE
Ketones, UA: NEGATIVE
Leukocytes, UA: NEGATIVE
NITRITE UA: NEGATIVE
PH UA: 5 (ref 5.0–7.5)
Protein, UA: NEGATIVE
Specific Gravity, UA: 1.02 (ref 1.005–1.030)
Urobilinogen, Ur: 0.2 mg/dL (ref 0.2–1.0)

## 2016-09-28 MED ORDER — MUPIROCIN 2 % EX OINT: 1.0000 "application " | TOPICAL_OINTMENT | Freq: Two times a day (BID) | CUTANEOUS | 1 refills | Status: DC

## 2016-09-28 MED ORDER — CEPHALEXIN 500 MG PO CAPS: 500.0000 mg | ORAL_CAPSULE | Freq: Three times a day (TID) | ORAL | 0 refills | Status: DC

## 2016-09-28 NOTE — Progress Notes (Signed)
Subjective:    Patient ID: Luis Deleon, male    DOB: 07/23/1941, 75 y.o.   MRN: [id number]  HPI Patient is here today for annual wellness exam and follow up of chronic medical problems which includes hyperlipidemia and hypertension. He is taking medications regularly.The patient is here today for a complete physical. He does complain of some right testicular pain and right shoulder pain. He also has some issues going on with his throat which he wants to address and some infection on his skin. He is due a PSA prostate FOBT and lab work. The patient has seen the gastroenterologist and a follow-up as planned sometime in the spring and any determination about a future need for colonoscopy will be made by him at that time. The patient does notice occasional trouble with some swallowing associated with his throat but this doesn't happen very often. He denies any chest pain pressure tightness or palpitations. He denies any shortness of breath. He says he's passing his water without problems. He does have this history of prostate cancer. He has had some abdominal pain and the gastroenterologist has evaluated this thoroughly and he thinks a lot of the pain is secondary to scar tissue. He also complains of some right testicle pain that is been going on for 2-3 weeks just an aching sensation there. He especially notices this with sitting. He has some right shoulder pain with no history of injury that has been going on for a couple of months. He is concerned also about where some skin lesions were removed from his left leg and there is some slight redness around the lower excision site.    Patient Active Problem List   Diagnosis Date Noted  . Thrombocytopenia ([place]) 04/05/2015  . Abdominal aortic atherosclerosis ([place]) 11/25/2014  . Metabolic syndrome 07/37/1062  . DOE (dyspnea on exertion) 08/11/2013  . 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 POLYCYTHEMIA 11/03/2008  . LIVER FUNCTION TESTS, ABNORMAL,  HX OF 09/24/2008  . Hyperlipidemia 07/28/2008  . GOUT, UNSPECIFIED 07/28/2008  . HTN (hypertension) 07/28/2008  . 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 DIVERTICULOSIS, COLON 07/28/2008  . Prostate cancer ([place]) 07/28/2008  . SMALL BOWEL OBSTRUCTION, HX OF 07/28/2008  . NEPHROLITHIASIS, HX OF 07/28/2008   Outpatient Encounter Prescriptions as of 09/28/2016  Medication Sig  . allopurinol (ZYLOPRIM) 100 MG tablet TAKE 2 TABLETS DAILY AS DIRECTED  . amoxicillin-clavulanate (AUGMENTIN) 875-125 MG tablet Take 1 tablet by mouth 2 (two) times daily.  Marland Kitchen aspirin 81 MG EC tablet Take 81 mg by mouth daily.    Marland Kitchen atorvastatin (LIPITOR) 40 MG tablet TAKE 1 TABLET EVERY DAY AS DIRECTED  . Cholecalciferol (VITAMIN D3) 1000 UNITS CAPS Take 2 capsules by mouth daily.   Marland Kitchen gemfibrozil (LOPID) 600 MG tablet TAKE 1 TABLET BY MOUTH EVERY DAY AS DIRECTED  . hydrochlorothiazide (HYDRODIURIL) 12.5 MG tablet Take 1 tablet (12.5 mg total) by mouth daily. As directed  . HYDROmorphone (DILAUDID) 2 MG tablet Take 1 tablet (2 mg total) by mouth every 4 (four) hours as needed for severe pain.  . hyoscyamine (LEVSIN SL) 0.125 MG SL tablet Place 2 tablets (0.25 mg total) under the tongue every 6 (six) hours as needed.  Marland Kitchen KLOR-CON M20 20 MEQ tablet TAKE 1 TAB TWICE DAILY ON MON, WED AND FRIDAY-DAILY EVERY OTHER DAY  . KLOR-CON M20 20 MEQ tablet TAKE 1 TAB TWICE DAILY ON MON, WED AND FRIDAY-DAILY EVERY OTHER DAY  . losartan (COZAAR) 100 MG tablet TAKE 1 TABLET BY  MOUTH EVERY DAY AS DIRECTED  . nystatin-triamcinolone (MYCOLOG II) cream Apply 1 application topically 2 (two) times daily.  . OMEGA-3 1000 MG CAPS Take 2 capsules by mouth 2 (two) times daily.  . [DISCONTINUED] Multiple Vitamins-Minerals (CENTRUM SILVER ADULT 50+ PO) Take 1 tablet by mouth daily.    No facility-administered encounter medications on file as of 09/28/2016.       Review of Systems  Constitutional: Negative.   HENT: Negative.   Eyes: Negative.     Respiratory: Negative.   Cardiovascular: Negative.   Gastrointestinal: Negative.        Throat  - dull ache at times, food gets stuck  Endocrine: Negative.   Genitourinary: Positive for penile pain (right testical pain).  Musculoskeletal: Positive for arthralgias (right shoulder and right arm pains at times).  Skin: Negative.        Recent Derm removal - redness  Allergic/Immunologic: Negative.   Neurological: Negative.   Hematological: Negative.   Psychiatric/Behavioral: Negative.        Objective:   Physical Exam  Constitutional: He is oriented to person, place, and time. He appears well-developed and well-nourished.  HENT:  Head: Normocephalic and atraumatic.  Right Ear: External ear normal.  Left Ear: External ear normal.  Nose: Nose normal.  Mouth/Throat: Oropharynx is clear and moist. No oropharyngeal exudate.  Dry airway  Eyes: Conjunctivae and EOM are normal. Pupils are equal, round, and reactive to light. Right eye exhibits no discharge. Left eye exhibits no discharge. No scleral icterus.  Neck: Normal range of motion. Neck supple. No thyromegaly present.  No bruits thyromegaly or anterior cervical adenopathy  Cardiovascular: Normal rate, regular rhythm, normal heart sounds and intact distal pulses.   No murmur heard. The heart has a regular rate and rhythm at 84/m  Pulmonary/Chest: Effort normal and breath sounds normal. No respiratory distress. He has no wheezes. He has no rales. He exhibits no tenderness.  Clear anteriorly and posteriorly and no axillary adenopathy  Abdominal: Soft. Bowel sounds are normal. He exhibits no mass. There is no tenderness. There is no rebound and no guarding.  Obese without masses tenderness or organ enlargement or bruits.  Genitourinary: Rectum normal and penis normal.  Genitourinary Comments: The prostate is absent and the vault is empty and there are no lumps or masses per exam. The external genitalia were normal without hernias being  present.  Musculoskeletal: Normal range of motion. He exhibits no edema.  Lymphadenopathy:    He has no cervical adenopathy.  Neurological: He is alert and oriented to person, place, and time. He has normal reflexes. No cranial nerve deficit.  Skin: Skin is warm and dry. Rash noted.  Eczema type rash on extensor services and erythema around recent excision sites on lower legs  Psychiatric: He has a normal mood and affect. His behavior is normal. Judgment and thought content normal.  Nursing note and vitals reviewed.   BP 114/69 (BP Location: Left Arm)   Pulse 79   Temp 97 F (36.1 C) (Oral)   Ht 5' 8"  (1.727 m)   Wt 232 lb (105.2 kg)   BMI 35.28 kg/m         Assessment & Plan:  1. Annual physical exam -Follow-up with gastroenterology as planned - BMP8+EGFR - CBC with Differential/Platelet - Hepatic function panel - VITAMIN D 25 Hydroxy (Vit-D Deficiency, Fractures) - PSA, total and free - NMR, lipoprofile - Urinalysis, Complete  2. Essential hypertension -The blood pressure is good today and patient will  continue with current treatment and sodium restriction - BMP8+EGFR - CBC with Differential/Platelet - Hepatic function panel  3. Pure hypercholesterolemia -Continue with aggressive therapeutic lifestyle changes and atorvastatin pending results of lab work - CBC with Differential/Platelet - NMR, lipoprofile  4. Vitamin D deficiency -Continue with vitamin D replacement pending results of lab work - CBC with Differential/Platelet - VITAMIN D 25 Hydroxy (Vit-D Deficiency, Fractures)  5. Metabolic syndrome -Continue to make every effort at weight loss using diet and exercise - BMP8+EGFR - CBC with Differential/Platelet  6. Thrombocytopenia ([place]) -The patient gives no history of bleeding issues. - CBC with Differential/Platelet  7. Abdominal aortic atherosclerosis ([place]) -Continue aggressive therapeutic lifestyle changes and statin therapy  8. Crohn's disease  without complication, unspecified gastrointestinal tract location [facility]) -Follow-up with gastroenterology as planned  9. Prostate cancer [facility]) -Everything appears stable from this point and PSA is pending.  Patient Instructions                       Medicare Annual Wellness Visit  [facility] and the medical providers at [facility] strive to bring you the best medical care.  In doing so we not only want to address your current medical conditions and concerns but also to detect new conditions early and prevent illness, disease and health-related problems.    Medicare offers a yearly Wellness Visit which allows our clinical staff to assess your need for preventative services including immunizations, lifestyle education, counseling to decrease risk of preventable diseases and screening for fall risk and other medical concerns.    This visit is provided free of charge (no copay) for all Medicare recipients. The clinical pharmacists at [facility] have begun to conduct these Wellness Visits which will also include a thorough review of all your medications.    As you primary medical provider recommend that you make an appointment for your Annual Wellness Visit if you have not done so already this year.  You may set up this appointment before you leave today or you may call back ([phone]) and schedule an appointment.  Please make sure when you call that you mention that you are scheduling your Annual Wellness Visit with the clinical pharmacist so that the appointment may be made for the proper length of time.     Continue current medications. Continue good therapeutic lifestyle changes which include good diet and exercise. Fall precautions discussed with patient. If an FOBT was given today- please return it to our front desk. If you are over 2 years old - you may need Prevnar 90 or the adult Pneumonia vaccine.  **Flu shots are available--- please call  and schedule a FLU-CLINIC appointment**  After your visit with [place] today you will receive a survey in the mail or online from [employer] regarding your care with [place]. Please take a moment to fill this out. Your feedback is very important to [place] as you can help [place] better understand your patient needs as well as improve your experience and satisfaction. WE CARE ABOUT YOU!!!   Take antibiotic as directed Use Bactroban ointment sparingly twice daily to excision site lesions If the right shoulder continues to give you pain try taking ibuprofen once or twice daily after eating for about 7 days and no longer along with using warm wet compresses 20 minutes 3 or 4 times daily Follow-up with gastroenterology as planned  Arrie Senate MD

## 2016-09-28 NOTE — Telephone Encounter (Signed)
Patient wants to know if you are going to send in medication to [facility] for the infection around rash on ankles?

## 2016-09-28 NOTE — Patient Instructions (Addendum)
Medicare Annual Wellness Visit  [facility] and the medical providers at [facility] strive to bring you the best medical care.  In doing so we not only want to address your current medical conditions and concerns but also to detect new conditions early and prevent illness, disease and health-related problems.    Medicare offers a yearly Wellness Visit which allows our clinical staff to assess your need for preventative services including immunizations, lifestyle education, counseling to decrease risk of preventable diseases and screening for fall risk and other medical concerns.    This visit is provided free of charge (no copay) for all Medicare recipients. The clinical pharmacists at [facility] have begun to conduct these Wellness Visits which will also include a thorough review of all your medications.    As you primary medical provider recommend that you make an appointment for your Annual Wellness Visit if you have not done so already this year.  You may set up this appointment before you leave today or you may call back ([phone]) and schedule an appointment.  Please make sure when you call that you mention that you are scheduling your Annual Wellness Visit with the clinical pharmacist so that the appointment may be made for the proper length of time.     Continue current medications. Continue good therapeutic lifestyle changes which include good diet and exercise. Fall precautions discussed with patient. If an FOBT was given today- please return it to our front desk. If you are over 43 years old - you may need Prevnar 42 or the adult Pneumonia vaccine.  **Flu shots are available--- please call and schedule a FLU-CLINIC appointment**  After your visit with [place] today you will receive a survey in the mail or online from [employer] regarding your care with [place]. Please take a moment to fill this out. Your feedback is very  important to [place] as you can help [place] better understand your patient needs as well as improve your experience and satisfaction. WE CARE ABOUT YOU!!!   Take antibiotic as directed Use Bactroban ointment sparingly twice daily to excision site lesions If the right shoulder continues to give you pain try taking ibuprofen once or twice daily after eating for about 7 days and no longer along with using warm wet compresses 20 minutes 3 or 4 times daily Follow-up with gastroenterology as planned

## 2016-09-28 NOTE — Telephone Encounter (Signed)
Aware. Message sent to nurse to ask provider about medication fror rash.

## 2016-09-28 NOTE — Telephone Encounter (Signed)
Make sure that Keflex 500 mg 3 times daily was sent into patient's pharmacy to take for 1 week. Also Bactroban ointment to apply sparingly twice daily to the irritated skin lesions

## 2016-09-29 LAB — CBC WITH DIFFERENTIAL/PLATELET
BASOS: 0 %
Basophils Absolute: 0 10*3/uL (ref 0.0–0.2)
EOS (ABSOLUTE): 0.2 10*3/uL (ref 0.0–0.4)
Eos: 2 %
HEMATOCRIT: 51.8 % — AB (ref 37.5–51.0)
Hemoglobin: 17.2 g/dL (ref 13.0–17.7)
IMMATURE GRANS (ABS): 0 10*3/uL (ref 0.0–0.1)
Immature Granulocytes: 0 %
Lymphocytes Absolute: 1.8 10*3/uL (ref 0.7–3.1)
Lymphs: 26 %
MCH: 31.8 pg (ref 26.6–33.0)
MCHC: 33.2 g/dL (ref 31.5–35.7)
MCV: 96 fL (ref 79–97)
MONOS ABS: 0.7 10*3/uL (ref 0.1–0.9)
Monocytes: 10 %
NEUTROS ABS: 4.3 10*3/uL (ref 1.4–7.0)
Neutrophils: 62 %
PLATELETS: 169 10*3/uL (ref 150–379)
RBC: 5.41 x10E6/uL (ref 4.14–5.80)
RDW: 15.5 % — AB (ref 12.3–15.4)
WBC: 7 10*3/uL (ref 3.4–10.8)

## 2016-09-29 LAB — HEPATIC FUNCTION PANEL
ALT: 40 IU/L (ref 0–44)
AST: 42 IU/L — AB (ref 0–40)
Albumin: 3.8 g/dL (ref 3.5–4.8)
Alkaline Phosphatase: 63 IU/L (ref 39–117)
BILIRUBIN, DIRECT: 0.29 mg/dL (ref 0.00–0.40)
Bilirubin Total: 1.5 mg/dL — ABNORMAL HIGH (ref 0.0–1.2)
Total Protein: 6.4 g/dL (ref 6.0–8.5)

## 2016-09-29 LAB — BMP8+EGFR
BUN/Creatinine Ratio: 12 (ref 10–24)
BUN: 15 mg/dL (ref 8–27)
CO2: 24 mmol/L (ref 18–29)
Calcium: 9.5 mg/dL (ref 8.6–10.2)
Chloride: 103 mmol/L (ref 96–106)
Creatinine, Ser: 1.29 mg/dL — ABNORMAL HIGH (ref 0.76–1.27)
GFR, EST AFRICAN AMERICAN: 63 mL/min/{1.73_m2} (ref >59)
GFR, EST NON AFRICAN AMERICAN: 55 mL/min/{1.73_m2} — AB (ref >59)
Glucose: 91 mg/dL (ref 65–99)
POTASSIUM: 3.8 mmol/L (ref 3.5–5.2)
SODIUM: 145 mmol/L — AB (ref 134–144)

## 2016-09-29 LAB — PSA, TOTAL AND FREE
PSA, Free: <0.01 ng/mL
Prostate Specific Ag, Serum: <0.1 ng/mL (ref 0.0–4.0)

## 2016-09-29 LAB — NMR, LIPOPROFILE
Cholesterol: 129 mg/dL (ref 100–199)
HDL CHOLESTEROL BY NMR: 37 mg/dL — AB (ref >39)
HDL PARTICLE NUMBER: 28.2 umol/L — AB (ref >=30.5)
LDL Particle Number: 884 nmol/L (ref <1000)
LDL Size: 20.2 nm (ref >20.5)
LDL-C: 52 mg/dL (ref 0–99)
LP-IR Score: 64 — ABNORMAL HIGH (ref <=45)
Small LDL Particle Number: 513 nmol/L (ref <=527)
Triglycerides by NMR: 202 mg/dL — ABNORMAL HIGH (ref 0–149)

## 2016-09-29 LAB — VITAMIN D 25 HYDROXY (VIT D DEFICIENCY, FRACTURES): VIT D 25 HYDROXY: 38.2 ng/mL (ref 30.0–100.0)

## 2016-10-01 ENCOUNTER — Other Ambulatory Visit: Payer: Medicare Other

## 2016-10-01 DIAGNOSIS — Z1211 Encounter for screening for malignant neoplasm of colon: Secondary | ICD-10-CM

## 2016-10-02 LAB — FECAL OCCULT BLOOD, IMMUNOCHEMICAL: FECAL OCCULT BLD: NEGATIVE

## 2016-10-04 ENCOUNTER — Telehealth: Payer: Self-pay | Admitting: Family Medicine

## 2016-10-04 NOTE — Progress Notes (Signed)
Pt notified of results Verbalizes understanding 

## 2016-10-04 NOTE — Telephone Encounter (Signed)
Pt notified of results Copy of PSA mailed to pt per request

## 2016-11-05 DIAGNOSIS — L570 Actinic keratosis: Secondary | ICD-10-CM | POA: Diagnosis not present

## 2016-11-05 DIAGNOSIS — D225 Melanocytic nevi of trunk: Secondary | ICD-10-CM | POA: Diagnosis not present

## 2016-11-05 DIAGNOSIS — L821 Other seborrheic keratosis: Secondary | ICD-10-CM | POA: Diagnosis not present

## 2016-11-05 DIAGNOSIS — L820 Inflamed seborrheic keratosis: Secondary | ICD-10-CM | POA: Diagnosis not present

## 2016-11-05 DIAGNOSIS — X32XXXD Exposure to sunlight, subsequent encounter: Secondary | ICD-10-CM | POA: Diagnosis not present

## 2016-12-04 ENCOUNTER — Other Ambulatory Visit: Payer: Self-pay | Admitting: Family Medicine

## 2016-12-17 ENCOUNTER — Other Ambulatory Visit: Payer: Self-pay | Admitting: Family Medicine

## 2017-01-07 ENCOUNTER — Ambulatory Visit (INDEPENDENT_AMBULATORY_CARE_PROVIDER_SITE_OTHER): Payer: Medicare Other | Admitting: Internal Medicine

## 2017-01-07 ENCOUNTER — Encounter: Payer: Self-pay | Admitting: Internal Medicine

## 2017-01-07 VITALS — BP 124/78 | HR 78 | Resp 16 | Ht 68.0 in | Wt 240.0 lb

## 2017-01-07 DIAGNOSIS — Z8719 Personal history of other diseases of the digestive system: Secondary | ICD-10-CM

## 2017-01-07 DIAGNOSIS — K565 Intestinal adhesions [bands], unspecified as to partial versus complete obstruction: Secondary | ICD-10-CM | POA: Diagnosis not present

## 2017-01-07 NOTE — Patient Instructions (Signed)
Please follow up Dr Hilarie Fredrickson in 9-12 months.  When having symptoms of intermittent partial small bowel obstruction, please take Levsin and dilaudid at current doses as you do already.  If you are age 74 or older, your body mass index should be between 23-30. Your Body mass index is 36.49 kg/m. If this is out of the aforementioned range listed, please consider follow up with your Primary Care Provider.  If you are age 79 or younger, your body mass index should be between 19-25. Your Body mass index is 36.49 kg/m. If this is out of the aformentioned range listed, please consider follow up with your Primary Care Provider.

## 2017-01-07 NOTE — Progress Notes (Signed)
Subjective:    Patient ID: Luis Deleon, male    DOB: 10-05-1941, 76 y.o.   MRN: [id number]  HPI Luis Deleon is a 76 year old male with a history of ileal Crohn's disease status post ileocecectomy in 2003, history of ventral hernia status post repair with mesh, history of intermittent partial small bowel obstructions, history of small intestinal bacterial overgrowth, and history of fatty liver disease who is here for follow-up. He is here alone today. He was last seen in October 2017.  He reports that he has been feeling well from a GI perspective. He has not had any episodes of excruciating abdominal pain which are felt to represent partial small bowel obstructions. On 2 occasions since being seen here in October 2017 he developed early signs of obstruction primarily abdominal pain, borborygmi and he was nervous about an upcoming attack. He took 0.25 mg of sublingual Levsin and a 2 mg oral hydromorphone tablet and then rested. Both of these attacks were aborted by these medication and did not progress to his previous severe abdominal pain, nausea vomiting and post obstruction diarrhea. He is very happy with this result and he feels that previously without this medication he would've progressed.  His abdominal gas, bloating and fecal urgency has been better after treatment with Augmentin for bacterial overgrowth. These symptoms have not really recurred. He does notice loose and urgent stools if he eats seafood. He averages 2-3 soft but formed stools daily. He denies blood in his stool or melena.  Review of Systems As per history of present illness, otherwise negative  Current Medications, Allergies, Past Medical History, Past Surgical History, Family History and Social History were reviewed in [employer] record.     Objective:   Physical Exam BP 124/78   Pulse 78   Resp 16   Ht 5' 8"  (1.727 m)   Wt 240 lb (108.9 kg)   BMI 36.49 kg/m  Constitutional:  Well-developed and well-nourished. No distress. HEENT: Normocephalic and atraumatic.Conjunctivae are normal.  No scleral icterus. Neck: Neck supple. Trachea midline. Cardiovascular: Normal rate, regular rhythm and intact distal pulses. No M/R/G Pulmonary/chest: Effort normal and breath sounds normal. No wheezing, rales or rhonchi. Abdominal: Soft, nontender, nondistended. Bowel sounds active throughout.  No hepatosplenomegaly. Extremities: no clubbing, cyanosis, or edema Neurological: Alert and oriented to person place and time. Skin: Skin is warm and dry.  Psychiatric: Normal mood and affect. Behavior is normal.  CBC    Component Value Date/Time   WBC 7.0 09/28/2016 0912   WBC 6.9 04/05/2015 0946   WBC 7.0 01/07/2013 1057   WBC 7.8 03/02/2011 1738   RBC 5.41 09/28/2016 0912   RBC 5.72 04/05/2015 0946   RBC 5.29 01/07/2013 1057   RBC 5.55 03/02/2011 1738   HGB 17.1 04/05/2015 0946   HGB 16.8 01/07/2013 1057   HCT 51.8 (H) 09/28/2016 0912   HCT 48.1 01/07/2013 1057   PLT 169 09/28/2016 0912   MCV 96 09/28/2016 0912   MCV 90.9 01/07/2013 1057   MCH 31.8 09/28/2016 0912   MCH 29.9 04/05/2015 0946   MCH 31.8 01/07/2013 1057   MCHC 33.2 09/28/2016 0912   MCHC 32.1 04/05/2015 0946   MCHC 34.9 01/07/2013 1057   MCHC 34.7 03/02/2011 1738   RDW 15.5 (H) 09/28/2016 0912   RDW 14.7 (H) 01/07/2013 1057   LYMPHSABS 1.8 09/28/2016 0912   LYMPHSABS 1.4 01/07/2013 1057   MONOABS 0.8 01/07/2013 1057   EOSABS 0.2 09/28/2016 0912   BASOSABS  0.0 09/28/2016 0912   BASOSABS 0.0 01/07/2013 1057      Assessment & Plan:   76 year old male with a history of ileal Crohn's disease status post ileocecectomy in 2003, history of ventral hernia status post repair with mesh, history of intermittent partial small bowel obstructions, history of small intestinal bacterial overgrowth, and history of fatty liver disease who is here for follow-up.  1. Ileal Crohn's disease -- likely in remission and also  no evidence of active disease by MR enterography. Not currently on Crohn's therapy and at currently do not see an indication to begin therapy.  2. 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 2 attacks successfully aborted in the last 6 months with anti-spasmodic and pain control. We'll continue Levsin 0.25 mg sublingual for any abdominal discomfort/pain or early obstructive symptoms. Can also used allotted 2 mg orally when these attacks began. He understands that if they progress he may need to go to the ER. We have previously discussed surgery for lysis of adhesions but he wishes to avoid this which I agree with. Certainly lysis of adhesions may help for some time but certainly would increase risk of further adhesions.  3. 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 Symptoms have not recurred though if and when they do we can retreat at that time.  4. CRC screening -- no history of adenomatous polyps. Would repeat colonoscopy in 10 years  9-12 month follow-up, sooner if necessary 25 minutes spent with the patient today. Greater than 50% was spent in counseling and coordination of care with the patient

## 2017-01-16 ENCOUNTER — Encounter: Payer: Self-pay | Admitting: Family Medicine

## 2017-01-16 ENCOUNTER — Ambulatory Visit (INDEPENDENT_AMBULATORY_CARE_PROVIDER_SITE_OTHER): Payer: Medicare Other | Admitting: Family Medicine

## 2017-01-16 VITALS — BP 122/74 | HR 84 | Temp 97.6°F | Ht 68.0 in | Wt 240.0 lb

## 2017-01-16 DIAGNOSIS — E7800 Pure hypercholesterolemia, unspecified: Secondary | ICD-10-CM

## 2017-01-16 DIAGNOSIS — E559 Vitamin D deficiency, unspecified: Secondary | ICD-10-CM

## 2017-01-16 DIAGNOSIS — K5090 Crohn's disease, unspecified, without complications: Secondary | ICD-10-CM | POA: Diagnosis not present

## 2017-01-16 DIAGNOSIS — I700 Atherosclerosis of aorta: Secondary | ICD-10-CM

## 2017-01-16 DIAGNOSIS — M79601 Pain in right arm: Secondary | ICD-10-CM | POA: Diagnosis not present

## 2017-01-16 DIAGNOSIS — D696 Thrombocytopenia, unspecified: Secondary | ICD-10-CM | POA: Diagnosis not present

## 2017-01-16 DIAGNOSIS — I10 Essential (primary) hypertension: Secondary | ICD-10-CM | POA: Diagnosis not present

## 2017-01-16 DIAGNOSIS — E8881 Metabolic syndrome: Secondary | ICD-10-CM | POA: Diagnosis not present

## 2017-01-16 DIAGNOSIS — C61 Malignant neoplasm of prostate: Secondary | ICD-10-CM | POA: Diagnosis not present

## 2017-01-16 MED ORDER — MUPIROCIN 2 % EX OINT: 1.0000 "application " | TOPICAL_OINTMENT | Freq: Two times a day (BID) | CUTANEOUS | 1 refills | Status: DC

## 2017-01-16 NOTE — Progress Notes (Signed)
Subjective:    Patient ID: Luis Deleon, male    DOB: 1941-08-13, 76 y.o.   MRN: [id number]  HPI Pt here for follow up and management of chronic medical problems which includes hyperlipidemia and hypertension. He is taking medication regularly.The patient is complaining today was some right upper arm soreness. He is requesting a refill on his Bactroban. He will get lab work today. He sees the gastroenterologist on a regular basis. The patient denies any chest pain or shortness of breath. He has no trouble with swallowing heartburn indigestion nausea or vomiting. He does have 3 loose watery bowel movements daily and this is the norm for him with his Crohn's disease. Recently he was given a prescription for Levsin and medication for pain if the Levsin does not help when he has these problems with his stomach. He has had prostate cancer he is currently having no problems with passing his water and does have good urinary control. He does describe some right upper arm soreness with no history of any injury. There is a sharp pain when he moves his arm in a certain his edition. His weight and vital signs are stable. The patient is also dealing with his wife who has cancer of the right leg and has uterine cancer. He is stressed out by this by the multiple trips he has to see multiple specialists every week.    Patient Active Problem List   Diagnosis Date Noted  . Thrombocytopenia ([place]) 04/05/2015  . Abdominal aortic atherosclerosis ([place]) 11/25/2014  . Metabolic syndrome 95/62/1308  . DOE (dyspnea on exertion) 08/11/2013  . 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 POLYCYTHEMIA 11/03/2008  . LIVER FUNCTION TESTS, ABNORMAL, HX OF 09/24/2008  . Hyperlipidemia 07/28/2008  . GOUT, UNSPECIFIED 07/28/2008  . HTN (hypertension) 07/28/2008  . 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 DIVERTICULOSIS, COLON 07/28/2008  . Prostate cancer ([place]) 07/28/2008  . SMALL BOWEL OBSTRUCTION, HX OF 07/28/2008  .  NEPHROLITHIASIS, HX OF 07/28/2008   Outpatient Encounter Prescriptions as of 01/16/2017  Medication Sig  . allopurinol (ZYLOPRIM) 100 MG tablet TAKE 2 TABLETS DAILY AS DIRECTED  . aspirin 81 MG EC tablet Take 81 mg by mouth daily.    Marland Kitchen atorvastatin (LIPITOR) 40 MG tablet TAKE 1 TABLET EVERY DAY AS DIRECTED  . Cholecalciferol (VITAMIN D3) 1000 UNITS CAPS Take 2 capsules by mouth daily.   Marland Kitchen gemfibrozil (LOPID) 600 MG tablet TAKE 1 TABLET BY MOUTH EVERY DAY AS DIRECTED  . hydrochlorothiazide (HYDRODIURIL) 12.5 MG tablet Take 1 tablet (12.5 mg total) by mouth daily. As directed  . HYDROmorphone (DILAUDID) 2 MG tablet Take 1 tablet (2 mg total) by mouth every 4 (four) hours as needed for severe pain.  . hyoscyamine (LEVSIN SL) 0.125 MG SL tablet Place 2 tablets (0.25 mg total) under the tongue every 6 (six) hours as needed.  Marland Kitchen KLOR-CON M20 20 MEQ tablet TAKE 1 TAB TWICE DAILY ON MON, WED AND FRIDAY-DAILY EVERY OTHER DAY  . losartan (COZAAR) 100 MG tablet TAKE 1 TABLET BY MOUTH EVERY DAY AS DIRECTED  . mupirocin ointment (BACTROBAN) 2 % Place 1 application into the nose 2 (two) times daily.  Marland Kitchen nystatin-triamcinolone (MYCOLOG II) cream Apply 1 application topically 2 (two) times daily.  . OMEGA-3 1000 MG CAPS Take 2 capsules by mouth 2 (two) times daily.   No facility-administered encounter medications on file as of 01/16/2017.       Review of Systems  Constitutional: Negative.   HENT: Negative.  Eyes: Negative.   Respiratory: Negative.   Cardiovascular: Negative.   Gastrointestinal: Negative.   Endocrine: Negative.   Genitourinary: Negative.   Musculoskeletal: Positive for arthralgias (right upper arm pain when lifting things).  Skin: Negative.   Allergic/Immunologic: Negative.   Neurological: Negative.   Hematological: Negative.   Psychiatric/Behavioral: Negative.        Objective:   Physical Exam  Constitutional: He is oriented to person, place, and time. He appears well-developed  and well-nourished. No distress.  HENT:  Head: Normocephalic and atraumatic.  Right Ear: External ear normal.  Left Ear: External ear normal.  Mouth/Throat: Oropharynx is clear and moist. No oropharyngeal exudate.  Nasal congestion and turbinate swelling bilaterally  Eyes: Conjunctivae and EOM are normal. Pupils are equal, round, and reactive to light. Right eye exhibits no discharge. Left eye exhibits no discharge. No scleral icterus.  Neck: Normal range of motion. Neck supple. No thyromegaly present.  No bruits thyromegaly or anterior cervical adenopathy  Cardiovascular: Normal rate, regular rhythm, normal heart sounds and intact distal pulses.   No murmur heard. The heart is regular at 72/m  Pulmonary/Chest: Effort normal and breath sounds normal. No respiratory distress. He has no wheezes. He has no rales. He exhibits no tenderness.  Clear anteriorly and posteriorly and no axillary adenopathy  Abdominal: Soft. Bowel sounds are normal. He exhibits no mass. There is no tenderness. There is no rebound and no guarding.  No abdominal tenderness masses bruits or organ enlargement  Musculoskeletal: Normal range of motion. He exhibits no edema or tenderness.  Unable to elicit any point tenderness in the right shoulder or upper arm. The location of this soreness is generally in the right deltoid muscle. We will put a shot of cortisone into the muscle directly 60 mg.  Lymphadenopathy:    He has no cervical adenopathy.  Neurological: He is alert and oriented to person, place, and time. He has normal reflexes. No cranial nerve deficit.  Skin: Skin is warm and dry. No rash noted.  Psychiatric: He has a normal mood and affect. His behavior is normal. Judgment and thought content normal.  Nursing note and vitals reviewed.   BP 122/74 (BP Location: Left Arm)   Pulse 84   Temp 97.6 F (36.4 C) (Oral)   Ht 5' 8"  (1.727 m)   Wt 240 lb (108.9 kg)   BMI 36.49 kg/m        Assessment & Plan:  1.  Essential hypertension -The blood pressure is good and he will continue with current treatment - BMP8+EGFR - CBC with Differential/Platelet - Hepatic function panel  2. Pure hypercholesterolemia -Continue with aggressive therapeutic lifestyle changes and current treatment pending results of lab work - CBC with Differential/Platelet - Lipid panel  3. Vitamin D deficiency -Continue with current treatment pending results of lab work - CBC with Differential/Platelet - VITAMIN D 25 Hydroxy (Vit-D Deficiency, Fractures)  4. Metabolic syndrome -Continue to work on weight loss. The body mass index remains at 36.6. - BMP8+EGFR - CBC with Differential/Platelet  5. Abdominal aortic atherosclerosis ([place]) -Continue with aggressive therapeutic lifestyle changes and atorvastatin pending results of lab work - CBC with Differential/Platelet - Lipid panel  6. Thrombocytopenia ([facility]) -No bleeding issues related by the patient. - CBC with Differential/Platelet  7. Prostate cancer ([place]) -Yearly follow-ups for this currently. - CBC with Differential/Platelet  8. Crohn's disease without complication, unspecified gastrointestinal tract location [facility]) -Continue to follow-up with gastroenterology  9. Right arm pain -60 of Depo-Medrol IM to  the right deltoid.  Meds ordered this encounter  Medications  . mupirocin ointment (BACTROBAN) 2 %    Sig: Place 1 application into the nose 2 (two) times daily.    Dispense:  22 g    Refill:  1   Patient Instructions                       Medicare Annual Wellness Visit  [facility] and the medical providers at [facility] strive to bring you the best medical care.  In doing so we not only want to address your current medical conditions and concerns but also to detect new conditions early and prevent illness, disease and health-related problems.    Medicare offers a yearly Wellness Visit which allows our clinical staff to assess your  need for preventative services including immunizations, lifestyle education, counseling to decrease risk of preventable diseases and screening for fall risk and other medical concerns.    This visit is provided free of charge (no copay) for all Medicare recipients. The clinical pharmacists at [facility] have begun to conduct these Wellness Visits which will also include a thorough review of all your medications.    As you primary medical provider recommend that you make an appointment for your Annual Wellness Visit if you have not done so already this year.  You may set up this appointment before you leave today or you may call back ([phone]) and schedule an appointment.  Please make sure when you call that you mention that you are scheduling your Annual Wellness Visit with the clinical pharmacist so that the appointment may be made for the proper length of time.     Continue current medications. Continue good therapeutic lifestyle changes which include good diet and exercise. Fall precautions discussed with patient. If an FOBT was given today- please return it to our front desk. If you are over 21 years old - you may need Prevnar 30 or the adult Pneumonia vaccine.  **Flu shots are available--- please call and schedule a FLU-CLINIC appointment**  After your visit with [place] today you will receive a survey in the mail or online from [employer] regarding your care with [place]. Please take a moment to fill this out. Your feedback is very important to [place] as you can help [place] better understand your patient needs as well as improve your experience and satisfaction. WE CARE ABOUT YOU!!!   Use Flonase more regularly 1-2 sprays each nostril at bedtime Use nasal saline frequently during the day Continue to follow-up with gastroenterology as planned    Arrie Senate MD

## 2017-01-16 NOTE — Patient Instructions (Addendum)
Medicare Annual Wellness Visit  [facility] and the medical providers at [facility] strive to bring you the best medical care.  In doing so we not only want to address your current medical conditions and concerns but also to detect new conditions early and prevent illness, disease and health-related problems.    Medicare offers a yearly Wellness Visit which allows our clinical staff to assess your need for preventative services including immunizations, lifestyle education, counseling to decrease risk of preventable diseases and screening for fall risk and other medical concerns.    This visit is provided free of charge (no copay) for all Medicare recipients. The clinical pharmacists at [facility] have begun to conduct these Wellness Visits which will also include a thorough review of all your medications.    As you primary medical provider recommend that you make an appointment for your Annual Wellness Visit if you have not done so already this year.  You may set up this appointment before you leave today or you may call back ([phone]) and schedule an appointment.  Please make sure when you call that you mention that you are scheduling your Annual Wellness Visit with the clinical pharmacist so that the appointment may be made for the proper length of time.     Continue current medications. Continue good therapeutic lifestyle changes which include good diet and exercise. Fall precautions discussed with patient. If an FOBT was given today- please return it to our front desk. If you are over 39 years old - you may need Prevnar 57 or the adult Pneumonia vaccine.  **Flu shots are available--- please call and schedule a FLU-CLINIC appointment**  After your visit with [place] today you will receive a survey in the mail or online from [employer] regarding your care with [place]. Please take a moment to fill this out. Your feedback is very  important to [place] as you can help [place] better understand your patient needs as well as improve your experience and satisfaction. WE CARE ABOUT YOU!!!   Use Flonase more regularly 1-2 sprays each nostril at bedtime Use nasal saline frequently during the day Continue to follow-up with gastroenterology as planned

## 2017-01-17 LAB — HEPATIC FUNCTION PANEL
ALK PHOS: 63 IU/L (ref 39–117)
ALT: 42 IU/L (ref 0–44)
AST: 41 IU/L — AB (ref 0–40)
Albumin: 3.8 g/dL (ref 3.5–4.8)
BILIRUBIN, DIRECT: 0.31 mg/dL (ref 0.00–0.40)
Bilirubin Total: 1.5 mg/dL — ABNORMAL HIGH (ref 0.0–1.2)
TOTAL PROTEIN: 6.5 g/dL (ref 6.0–8.5)

## 2017-01-17 LAB — CBC WITH DIFFERENTIAL/PLATELET
Basophils Absolute: 0 10*3/uL (ref 0.0–0.2)
Basos: 0 %
EOS (ABSOLUTE): 0.1 10*3/uL (ref 0.0–0.4)
EOS: 2 %
HEMATOCRIT: 51.5 % — AB (ref 37.5–51.0)
HEMOGLOBIN: 17.9 g/dL — AB (ref 13.0–17.7)
IMMATURE GRANS (ABS): 0 10*3/uL (ref 0.0–0.1)
Immature Granulocytes: 0 %
Lymphocytes Absolute: 2 10*3/uL (ref 0.7–3.1)
Lymphs: 25 %
MCH: 32.4 pg (ref 26.6–33.0)
MCHC: 34.8 g/dL (ref 31.5–35.7)
MCV: 93 fL (ref 79–97)
MONOCYTES: 9 %
Monocytes Absolute: 0.7 10*3/uL (ref 0.1–0.9)
Neutrophils Absolute: 5 10*3/uL (ref 1.4–7.0)
Neutrophils: 64 %
Platelets: 161 10*3/uL (ref 150–379)
RBC: 5.52 x10E6/uL (ref 4.14–5.80)
RDW: 15.3 % (ref 12.3–15.4)
WBC: 7.8 10*3/uL (ref 3.4–10.8)

## 2017-01-17 LAB — LIPID PANEL
Chol/HDL Ratio: 3.7 ratio (ref 0.0–5.0)
Cholesterol, Total: 142 mg/dL (ref 100–199)
HDL: 38 mg/dL — ABNORMAL LOW (ref >39)
LDL Calculated: 63 mg/dL (ref 0–99)
Triglycerides: 205 mg/dL — ABNORMAL HIGH (ref 0–149)
VLDL CHOLESTEROL CAL: 41 mg/dL — AB (ref 5–40)

## 2017-01-17 LAB — BMP8+EGFR
BUN / CREAT RATIO: 10 (ref 10–24)
BUN: 12 mg/dL (ref 8–27)
CO2: 25 mmol/L (ref 18–29)
CREATININE: 1.17 mg/dL (ref 0.76–1.27)
Calcium: 9.4 mg/dL (ref 8.6–10.2)
Chloride: 102 mmol/L (ref 96–106)
GFR calc Af Amer: 71 mL/min/{1.73_m2} (ref >59)
GFR calc non Af Amer: 61 mL/min/{1.73_m2} (ref >59)
GLUCOSE: 92 mg/dL (ref 65–99)
Potassium: 3.9 mmol/L (ref 3.5–5.2)
Sodium: 145 mmol/L — ABNORMAL HIGH (ref 134–144)

## 2017-01-17 LAB — VITAMIN D 25 HYDROXY (VIT D DEFICIENCY, FRACTURES): VIT D 25 HYDROXY: 31.8 ng/mL (ref 30.0–100.0)

## 2017-01-30 ENCOUNTER — Other Ambulatory Visit: Payer: Self-pay | Admitting: Family Medicine

## 2017-02-22 ENCOUNTER — Ambulatory Visit (INDEPENDENT_AMBULATORY_CARE_PROVIDER_SITE_OTHER): Payer: Medicare Other | Admitting: Nurse Practitioner

## 2017-02-22 ENCOUNTER — Encounter: Payer: Self-pay | Admitting: Nurse Practitioner

## 2017-02-22 VITALS — BP 133/79 | HR 93 | Temp 97.1°F | Ht 68.0 in | Wt 240.0 lb

## 2017-02-22 DIAGNOSIS — L03011 Cellulitis of right finger: Secondary | ICD-10-CM | POA: Diagnosis not present

## 2017-02-22 MED ORDER — SULFAMETHOXAZOLE-TRIMETHOPRIM 800-160 MG PO TABS: 1.0000 | ORAL_TABLET | Freq: Two times a day (BID) | ORAL | 0 refills | Status: DC

## 2017-02-22 NOTE — Patient Instructions (Signed)
Fingertip Infection There are two main types of fingertip infections:  Paronychia. This is an infection that happens around your nail. This type of infection can start suddenly in one nail or occur gradually over time and affect more than one nail. Long-term (chronic) paronychia can make your fingernails thick and deformed.  Felon. This is a bacterial infection in the padded tip of your finger. Felon infection can cause a painful collection of pus (abscess) to form inside your fingertip. If the infection is not treated, the infection can spread as deep as the bone. What are the causes? Paronychia infection can be caused by bacteria, funguses, or a mix of both. Felon infection is usually caused by the bacteria that are normally found on your skin. An infection can develop if the bacteria spread through your skin to the pad of tissue inside your fingertip. What increases the risk? A fingertip infection is more likely to develop in people:  Who have diabetes.  Who have a weak body defense (immune) system.  Who work with their hands.  Whose hands are exposed to moisture, chemicals, or irritants for long periods of time.  Who have poor circulation.  Who bite, chew, or pick their fingernails. What are the signs or symptoms? Symptoms of paronychia may affect one or more fingernails and include:  Pain, swelling, and redness around the nail.  Pus-filled pockets at the base or side of the fingernail (cuticle).  Thick fingernails that separate from the nail bed.  Pus that drains from the nail bed. Symptoms of felon usually affect just one fingertip pad and include:  Severe, throbbing pain.  Redness.  Swelling.  Warmth.  Tenderness when the affected fingertip is touched. How is this diagnosed? A fingertip infection is diagnosed with a medical history and physical exam. If there is pus draining from the infection, it may be swabbed and sent to the lab for a culture. An X-ray may be  done to see if the infection has spread to the bone. How is this treated? Treatment for a fingertip infection may include:  Warm water or salt-water soaks several times per day.  Antibiotic medicine. This may be an ointment or pills.  Steroid ointment.  Antifungal pills.  Drainage of pus pockets. This is done by making a surgical cut (incision) to open the fingertip to drain pus.  Wearing gloves to protect your nails. Follow these instructions at home: Medicines   Take or apply over-the-counter and prescription medicines only as told by your health care provider.  If you were prescribed antibiotic medicine, take or apply it as told by your health care provider. Do not stop using the antibiotic even if your condition improves. Wound care   Clean the infected area each day with warm water or salt water, or as told by your health care provider.  Gently wash the infected area with mild soap and water.  Rinse the infected area with water to remove all soap.  Pat the infected area dry with a clean towel. Do not rub it.  To make a salt-water mixture, completely dissolve -1 tsp of salt in 1 cup of warm water.  Follow instructions from your health care provider about:  How to take care of the infection.  When and how you should change your bandage (dressing).  When you should remove your dressing.  Check the infected area every day for more signs of infection. Watch for:  More redness, swelling, or pain.  More fluid or blood.  Warmth.  A bad smell. General instructions   Keep the dressing dry until your health care provider says it can be removed. Do not take baths, swim, use a hot tub, or do anything that would put your wound underwater until your health care provider approves.  Raise (elevate) the infected area above the level of your heart while you are sitting or lying down or as told by your health care provider.  Do not scratch or pick at the infection.  Wear  gloves as told by your health care provider, if this applies.  Keep all follow-up visits as told by your health care provider. This is important. How is this prevented?  Wear gloves when you work with your hands.  Wash your hands often with antibacterial soap.  Avoid letting your hands stay wet or irritated for long periods of time.  Do not bite your fingernails. Do not pull on your cuticles. Do not suck on your fingers.  Use clean scissors or nail clippers to trim your nails. Do not cut your fingernails very short. Contact a health care provider if:  Your pain medicine is not helping.  You have more redness, swelling, or pain at your fingertip.  You continue to have fluid, blood, or pus coming from your fingertip.  Your infection area feels warm to the touch.  You continue to notice a bad smell coming from your fingertip or your dressing. Get help right away if:  The area of redness is spreading, or you notice a red streak going away from your fingertip.  You have a fever. This information is not intended to replace advice given to you by your health care provider. Make sure you discuss any questions you have with your health care provider. Document Released: 11/08/2004 Document Revised: 03/08/2016 Document Reviewed: 03/21/2015 Elsevier Interactive Patient Education  2017 [employer].

## 2017-02-22 NOTE — Progress Notes (Signed)
   Subjective:    Patient ID: Luis Deleon, male    DOB: December 08, 1940, 76 y.o.   MRN: [id number]  HPI Patient comes in c/o pain around right middle finger nail bed. He pulled a hang nail off of that finger 4 days ago. Now it is swollen and sore to touch.    Review of Systems  Constitutional: Negative.   Respiratory: Negative.   Neurological: Negative.   Psychiatric/Behavioral: Negative.   All other systems reviewed and are negative.      Objective:   Physical Exam  Constitutional: He is oriented to person, place, and time. He appears well-developed and well-nourished. No distress.  Cardiovascular: Normal rate and regular rhythm.   Pulmonary/Chest: Effort normal and breath sounds normal.  Neurological: He is alert and oriented to person, place, and time.  Skin: Skin is warm.  Erythematous edemaous skin around nail bed of right middle finger  Psychiatric: He has a normal mood and affect. His behavior is normal. Judgment and thought content normal.   BP 133/79   Pulse 93   Temp 97.1 F (36.2 C) (Oral)   Ht 5' 8"  (1.727 m)   Wt 240 lb (108.9 kg)   BMI 36.49 kg/m        Assessment & Plan:  1. Paronychia of right middle finger Soak in epsom salt BID Do not pick at area - sulfamethoxazole-trimethoprim (BACTRIM DS) 800-160 MG tablet; Take 1 tablet by mouth 2 (two) times daily.  Dispense: 20 tablet; Refill: 0  Mary-Margaret Hassell Done, FNP

## 2017-03-14 ENCOUNTER — Other Ambulatory Visit: Payer: Self-pay | Admitting: Family Medicine

## 2017-03-18 ENCOUNTER — Other Ambulatory Visit: Payer: Self-pay | Admitting: Family Medicine

## 2017-03-19 ENCOUNTER — Other Ambulatory Visit: Payer: Self-pay | Admitting: Family Medicine

## 2017-04-15 ENCOUNTER — Telehealth: Payer: Self-pay | Admitting: Family Medicine

## 2017-04-15 NOTE — Telephone Encounter (Signed)
Pt aware to call me

## 2017-04-23 ENCOUNTER — Encounter: Payer: Self-pay | Admitting: *Deleted

## 2017-05-06 ENCOUNTER — Encounter (HOSPITAL_COMMUNITY): Payer: Self-pay

## 2017-05-06 ENCOUNTER — Emergency Department (HOSPITAL_COMMUNITY)
Admission: EM | Admit: 2017-05-06 | Discharge: 2017-05-06 | Discharge status: Against Medical Advice | Payer: Medicare Other | Attending: Emergency Medicine | Admitting: Emergency Medicine

## 2017-05-06 DIAGNOSIS — R1084 Generalized abdominal pain: Secondary | ICD-10-CM | POA: Diagnosis not present

## 2017-05-06 DIAGNOSIS — Z5321 Procedure and treatment not carried out due to patient leaving prior to being seen by health care provider: Secondary | ICD-10-CM | POA: Diagnosis not present

## 2017-05-06 LAB — URINALYSIS, ROUTINE W REFLEX MICROSCOPIC
Bacteria, UA: NONE SEEN
Bilirubin Urine: NEGATIVE
GLUCOSE, UA: NEGATIVE mg/dL
Ketones, ur: NEGATIVE mg/dL
Leukocytes, UA: NEGATIVE
NITRITE: NEGATIVE
PH: 5 (ref 5.0–8.0)
PROTEIN: NEGATIVE mg/dL
SPECIFIC GRAVITY, URINE: 1.018 (ref 1.005–1.030)

## 2017-05-06 NOTE — ED Notes (Signed)
Patient informed registration that he is leaving. States he will return if he feels worse.

## 2017-05-06 NOTE — ED Triage Notes (Signed)
Reports of mid abdominal pain with nausea and vomiting that started this morning. Last normal BM was last night.

## 2017-05-07 ENCOUNTER — Telehealth: Payer: Self-pay | Admitting: Internal Medicine

## 2017-05-07 NOTE — Telephone Encounter (Signed)
The pt has been advised and will call back if he has any further attacks

## 2017-05-07 NOTE — Telephone Encounter (Signed)
Pt had an attack yesterday of severe abd pain for about 12 hours.  Feels ok today but would just like Dr Hilarie Fredrickson to be aware.  He states Dr Hilarie Fredrickson wanted an update if he experienced this pain again.  He tried 2 dilaudid but it did not help.  Levsin did not help either. FYI Dr Hilarie Fredrickson

## 2017-05-07 NOTE — Telephone Encounter (Signed)
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

## 2017-05-14 DIAGNOSIS — H524 Presbyopia: Secondary | ICD-10-CM | POA: Diagnosis not present

## 2017-05-14 DIAGNOSIS — H5203 Hypermetropia, bilateral: Secondary | ICD-10-CM | POA: Diagnosis not present

## 2017-05-14 DIAGNOSIS — H52223 Regular astigmatism, bilateral: Secondary | ICD-10-CM | POA: Diagnosis not present

## 2017-05-14 DIAGNOSIS — H2513 Age-related nuclear cataract, bilateral: Secondary | ICD-10-CM | POA: Diagnosis not present

## 2017-05-27 ENCOUNTER — Other Ambulatory Visit: Payer: Self-pay | Admitting: *Deleted

## 2017-05-27 ENCOUNTER — Ambulatory Visit: Payer: Medicare Other | Admitting: Family Medicine

## 2017-05-27 MED ORDER — ALLOPURINOL 100 MG PO TABS: ORAL_TABLET | ORAL | 2 refills | Status: DC

## 2017-05-28 ENCOUNTER — Other Ambulatory Visit: Payer: Self-pay | Admitting: *Deleted

## 2017-05-28 MED ORDER — ALLOPURINOL 100 MG PO TABS: ORAL_TABLET | ORAL | 0 refills | Status: DC

## 2017-06-16 ENCOUNTER — Other Ambulatory Visit: Payer: Self-pay | Admitting: Family Medicine

## 2017-06-18 NOTE — Telephone Encounter (Signed)
Next Ov 07/03/17

## 2017-06-19 ENCOUNTER — Ambulatory Visit: Payer: Medicare Other | Admitting: Family Medicine

## 2017-07-03 ENCOUNTER — Ambulatory Visit (INDEPENDENT_AMBULATORY_CARE_PROVIDER_SITE_OTHER): Payer: Medicare Other | Admitting: Family Medicine

## 2017-07-03 ENCOUNTER — Encounter: Payer: Self-pay | Admitting: Family Medicine

## 2017-07-03 VITALS — BP 119/75 | HR 86 | Temp 97.5°F | Ht 69.0 in | Wt 244.0 lb

## 2017-07-03 DIAGNOSIS — K5090 Crohn's disease, unspecified, without complications: Secondary | ICD-10-CM | POA: Diagnosis not present

## 2017-07-03 DIAGNOSIS — C61 Malignant neoplasm of prostate: Secondary | ICD-10-CM

## 2017-07-03 DIAGNOSIS — I700 Atherosclerosis of aorta: Secondary | ICD-10-CM | POA: Diagnosis not present

## 2017-07-03 DIAGNOSIS — R42 Dizziness and giddiness: Secondary | ICD-10-CM | POA: Diagnosis not present

## 2017-07-03 DIAGNOSIS — R5383 Other fatigue: Secondary | ICD-10-CM | POA: Diagnosis not present

## 2017-07-03 DIAGNOSIS — I10 Essential (primary) hypertension: Secondary | ICD-10-CM

## 2017-07-03 DIAGNOSIS — D696 Thrombocytopenia, unspecified: Secondary | ICD-10-CM

## 2017-07-03 DIAGNOSIS — E7800 Pure hypercholesterolemia, unspecified: Secondary | ICD-10-CM | POA: Diagnosis not present

## 2017-07-03 DIAGNOSIS — E8881 Metabolic syndrome: Secondary | ICD-10-CM | POA: Diagnosis not present

## 2017-07-03 DIAGNOSIS — E559 Vitamin D deficiency, unspecified: Secondary | ICD-10-CM | POA: Diagnosis not present

## 2017-07-03 MED ORDER — FLUTICASONE PROPIONATE 50 MCG/ACT NA SUSP: 2.0000 | Freq: Every day | NASAL | 6 refills | Status: DC

## 2017-07-03 MED ORDER — MECLIZINE HCL 12.5 MG PO TABS: ORAL_TABLET | ORAL | 0 refills | Status: DC

## 2017-07-03 NOTE — Progress Notes (Signed)
Subjective:    Patient ID: Luis Deleon, male    DOB: [birth year], 76 y.o.   MRN: [id number]  HPI Pt here for follow up and management of chronic medical problems which includes hyperlipidemia and hypertension. He is taking medication regularly.The patient is doing well overall but does complain with a lot of lightheadedness and unsteadiness on his feet and this is been going on for about 3 weeks. He also complains of some fatigue. His vital signs are stable with a blood pressure being 119/75 and pulse rate at 86. He will get lab work today. He does see the gastroenterologist regularly. The patient has felt lightheaded a lot and unsteady on his feet with increased fatigue and decreased energy for about 3 weeks. He feels somewhat uncoordinated with this. He did have an episode in August of a severe bout with his Crohn's disease and went to the emergency room but never got seen because of the quantity of people there. He did have a urinalysis which was within normal limits. He says the problem is worse with the lightheadedness during the day and with movement. If he is still or laying down he does not have this lightheadedness. Sitting in exam room he can turn his head left and right and looking up and down and does not have any dizziness associated with that. He does complain of some stopped up feeling in his left ear. He denies any chest pain. He has occasional shortness of breath but not anymore than usual. He has an appointment with his gastroenterologist sometime in November for follow-up of his Crohn's disease. He denies any nausea or vomiting but does have his ongoing bouts with loose bowel movements. The episode in August he had severe pain requiring him to go to the emergency room but this did subside on its own. He has not seen any blood in the stool or had any black tarry bowel movements. He does have pain with the spells with his Crohn's disease.     Patient Active Problem List   Diagnosis  Date Noted  . Thrombocytopenia ([place]) 04/05/2015  . 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 Metabolic syndrome 91/66/0600  . DOE (dyspnea on exertion) 08/11/2013  . 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 POLYCYTHEMIA 11/03/2008  . LIVER FUNCTION TESTS, ABNORMAL, HX OF 09/24/2008  . Hyperlipidemia 07/28/2008  . GOUT, UNSPECIFIED 07/28/2008  . HTN (hypertension) 07/28/2008  . 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 DIVERTICULOSIS, COLON 07/28/2008  . Prostate cancer ([facility]) 07/28/2008  . SMALL BOWEL OBSTRUCTION, HX OF 07/28/2008  . NEPHROLITHIASIS, HX OF 07/28/2008   Outpatient Encounter Prescriptions as of 07/03/2017  Medication Sig  . allopurinol (ZYLOPRIM) 100 MG tablet TAKE 2 TABLETS DAILY AS DIRECTED  . aspirin 81 MG EC tablet Take 81 mg by mouth daily.    Marland Kitchen atorvastatin (LIPITOR) 40 MG tablet TAKE 1 TABLET EVERY DAY AS DIRECTED  . Cholecalciferol (VITAMIN D3) 1000 UNITS CAPS Take 2 capsules by mouth daily.   Marland Kitchen gemfibrozil (LOPID) 600 MG tablet TAKE 1 TABLET BY MOUTH EVERY DAY AS DIRECTED  . hydrochlorothiazide (MICROZIDE) 12.5 MG capsule TAKE 1 TABLET (12.5 MG TOTAL) BY MOUTH DAILY. AS DIRECTED  . HYDROmorphone (DILAUDID) 2 MG tablet Take 1 tablet (2 mg total) by mouth every 4 (four) hours as needed for severe pain.  . hyoscyamine (LEVSIN SL) 0.125 MG SL tablet Place 2 tablets (0.25 mg total) under the tongue every 6 (six) hours as needed.  Marland Kitchen KLOR-CON M20 20 MEQ tablet  TAKE 1 TAB TWICE DAILY ON MON, WED AND FRIDAY-DAILY EVERY OTHER DAY  . losartan (COZAAR) 100 MG tablet TAKE 1 TABLET BY MOUTH EVERY DAY AS DIRECTED  . mupirocin ointment (BACTROBAN) 2 % Place 1 application into the nose 2 (two) times daily.  Marland Kitchen nystatin-triamcinolone (MYCOLOG II) cream Apply 1 application topically 2 (two) times daily.  . OMEGA-3 1000 MG CAPS Take 2 capsules by mouth 2 (two) times daily.  . [DISCONTINUED] KLOR-CON M20 20 MEQ tablet TAKE 1 TAB TWICE DAILY ON MON, WED AND FRIDAY-DAILY  EVERY OTHER DAY  . [DISCONTINUED] sulfamethoxazole-trimethoprim (BACTRIM DS) 800-160 MG tablet Take 1 tablet by mouth 2 (two) times daily.   No facility-administered encounter medications on file as of 07/03/2017.      Review of Systems  Constitutional: Positive for fatigue.  HENT: Negative.   Eyes: Negative.   Respiratory: Negative.   Cardiovascular: Negative.   Gastrointestinal: Negative.   Endocrine: Negative.   Genitourinary: Negative.   Musculoskeletal: Negative.   Skin: Negative.   Allergic/Immunologic: Negative.   Neurological: Positive for light-headedness (unsteady at times x 3 weeks).  Hematological: Negative.   Psychiatric/Behavioral: Negative.        Objective:   Physical Exam  Constitutional: He is oriented to person, place, and time. He appears well-developed and well-nourished. No distress.  The patient is pleasant and alert and a good historian  HENT:  Head: Normocephalic and atraumatic.  Right Ear: External ear normal.  Left Ear: External ear normal.  Mouth/Throat: Oropharynx is clear and moist. No oropharyngeal exudate.  Nasal congestion and turbinate swelling bilaterally right greater than left. Both ear canals were clear and TMs appeared somewhat full on the left but without redness.  Eyes: Pupils are equal, round, and reactive to light. Conjunctivae and EOM are normal. Right eye exhibits no discharge. Left eye exhibits no discharge. No scleral icterus.  Neck: Normal range of motion. Neck supple. No thyromegaly present.  No bruits thyromegaly or anterior cervical adenopathy  Cardiovascular: Normal rate, regular rhythm, normal heart sounds and intact distal pulses.   No murmur heard. The heart is regular at 84/m  Pulmonary/Chest: Effort normal and breath sounds normal. No respiratory distress. He has no wheezes. He has no rales. He exhibits no tenderness.  Clear anteriorly and posteriorly and no axillary adenopathy  Abdominal: Soft. Bowel sounds are normal.  He exhibits no mass. There is no tenderness. There is no rebound and no guarding.  Abdominal obesity without masses tenderness or organ enlargement or bruits  Musculoskeletal: Normal range of motion. He exhibits no edema.  Lymphadenopathy:    He has no cervical adenopathy.  Neurological: He is alert and oriented to person, place, and time. He has normal reflexes. No cranial nerve deficit.  Skin: Skin is warm and dry. No rash noted.  Psychiatric: He has a normal mood and affect. His behavior is normal. Judgment and thought content normal.  Nursing note and vitals reviewed.  BP 119/75 (BP Location: Left Arm)   Pulse 86   Temp (!) 97.5 F (36.4 C) (Oral)   Ht 5' 9" (1.753 m)   Wt 244 lb (110.7 kg)   BMI 36.03 kg/m        Assessment & Plan:  1. Pure hypercholesterolemia -Continue with current treatment pending results of lab work. Continue with aggressive therapeutic lifestyle changes. - CBC with Differential/Platelet - Lipid panel  2. Essential hypertension -No change in treatment as blood pressure is good today. - CBC with Differential/Platelet - BMP8+EGFR -  Hepatic function panel  3. Vitamin D deficiency -Continue current treatment pending results of lab work - CBC with Differential/Platelet - VITAMIN D 25 Hydroxy (Vit-D Deficiency, Fractures)  4. Metabolic syndrome -Continue with aggressive therapeutic lifestyle changes to achieve weight loss with diet and exercise - CBC with Differential/Platelet  5. Thrombocytopenia (HCC) -No increased bleeding issues documented - CBC with Differential/Platelet  6. Prostate cancer (HCC) -Follow-up with urology as planned - CBC with Differential/Platelet  7. Other fatigue - Thyroid Panel With TSH - Vitamin B12  8. Light headed - Thyroid Panel With TSH - Vitamin B12 -Take meclizine for 1 week on a regular basis -Use Flonase regularly and take Claritin as directed for allergic rhinitis  9. Crohn's disease without  complication, unspecified gastrointestinal tract location (HCC) -All up with gastroenterology as planned in November  10. Abdominal aortic atherosclerosis (HCC) -Continue with aggressive therapeutic lifestyle changes  Meds ordered this encounter  Medications  . meclizine (ANTIVERT) 12.5 MG tablet    Sig: Take 1 tab before meals and at bedtime regularly for 1 week, then use TID PRN thereafter    Dispense:  30 tablet    Refill:  0  . fluticasone (FLONASE) 50 MCG/ACT nasal spray    Sig: Place 2 sprays into both nostrils daily.    Dispense:  16 g    Refill:  6   Patient Instructions                       Medicare Annual Wellness Visit  [place] and the medical providers at [facility] strive to bring you the best medical care.  In doing so we not only want to address your current medical conditions and concerns but also to detect new conditions early and prevent illness, disease and health-related problems.    Medicare offers a yearly Wellness Visit which allows our clinical staff to assess your need for preventative services including immunizations, lifestyle education, counseling to decrease risk of preventable diseases and screening for fall risk and other medical concerns.    This visit is provided free of charge (no copay) for all Medicare recipients. The clinical pharmacists at [facility] have begun to conduct these Wellness Visits which will also include a thorough review of all your medications.    As you primary medical provider recommend that you make an appointment for your Annual Wellness Visit if you have not done so already this year.  You may set up this appointment before you leave today or you may call back ([phone]) and schedule an appointment.  Please make sure when you call that you mention that you are scheduling your Annual Wellness Visit with the clinical pharmacist so that the appointment may be made for the proper  length of time.     Continue current medications. Continue good therapeutic lifestyle changes which include good diet and exercise. Fall precautions discussed with patient. If an FOBT was given today- please return it to our front desk. If you are over 50 years old - you may need Prevnar 13 or the adult Pneumonia vaccine.  **Flu shots are available--- please call and schedule a FLU-CLINIC appointment**  After your visit with us today you will receive a survey in the mail or online from [employer] regarding your care with us. Please take a moment to fill this out. Your feedback is very important to us as you can help us better understand your patient needs as well as   improve your experience and satisfaction. WE CARE ABOUT YOU!!!   Use Flonase regularly 2 sprays each nostril once daily at bedtime and after about a week reduce this to one spray each nostril at bedtime Take Claritin 1 daily Drink plenty of fluids and stay well hydrated Take meclizine or Antivert 12.5 mg one 4 times daily with meals and at bedtime for dizziness. Take this regularly for 1 week and then as needed after that Please call the office in a couple weeks if these weak spells and fatigue and unsteadiness are not improved and we may need to do a referral to the cardiologist or at least get a Holter monitor.    Arrie Senate MD

## 2017-07-03 NOTE — Patient Instructions (Addendum)
Medicare Annual Wellness Visit  [facility] and the medical providers at [facility] strive to bring you the best medical care.  In doing so we not only want to address your current medical conditions and concerns but also to detect new conditions early and prevent illness, disease and health-related problems.    Medicare offers a yearly Wellness Visit which allows our clinical staff to assess your need for preventative services including immunizations, lifestyle education, counseling to decrease risk of preventable diseases and screening for fall risk and other medical concerns.    This visit is provided free of charge (no copay) for all Medicare recipients. The clinical pharmacists at [facility] have begun to conduct these Wellness Visits which will also include a thorough review of all your medications.    As you primary medical provider recommend that you make an appointment for your Annual Wellness Visit if you have not done so already this year.  You may set up this appointment before you leave today or you may call back [phone]) and schedule an appointment.  Please make sure when you call that you mention that you are scheduling your Annual Wellness Visit with the clinical pharmacist so that the appointment may be made for the proper length of time.     Continue current medications. Continue good therapeutic lifestyle changes which include good diet and exercise. Fall precautions discussed with patient. If an FOBT was given today- please return it to our front desk. If you are over 87 years old - you may need Prevnar 82 or the adult Pneumonia vaccine.  **Flu shots are available--- please call and schedule a FLU-CLINIC appointment**  After your visit with [place] today you will receive a survey in the mail or online from [employer] regarding your care with [place]. Please take a moment to fill this out. Your feedback is very  important to [place] as you can help [place] better understand your patient needs as well as improve your experience and satisfaction. WE CARE ABOUT YOU!!!   Use Flonase regularly 2 sprays each nostril once daily at bedtime and after about a week reduce this to one spray each nostril at bedtime Take Claritin 1 daily Drink plenty of fluids and stay well hydrated Take meclizine or Antivert 12.5 mg one 4 times daily with meals and at bedtime for dizziness. Take this regularly for 1 week and then as needed after that Please call the office in a couple weeks if these weak spells and fatigue and unsteadiness are not improved and we may need to do a referral to the cardiologist or at least get a Holter monitor.

## 2017-07-04 ENCOUNTER — Other Ambulatory Visit: Payer: Self-pay | Admitting: *Deleted

## 2017-07-04 DIAGNOSIS — D582 Other hemoglobinopathies: Secondary | ICD-10-CM

## 2017-07-04 DIAGNOSIS — R718 Other abnormality of red blood cells: Secondary | ICD-10-CM

## 2017-07-04 LAB — CBC WITH DIFFERENTIAL/PLATELET
Basophils Absolute: 0 10*3/uL (ref 0.0–0.2)
Basos: 0 %
EOS (ABSOLUTE): 0.2 10*3/uL (ref 0.0–0.4)
Eos: 2 %
HEMATOCRIT: 51.4 % — AB (ref 37.5–51.0)
HEMOGLOBIN: 18 g/dL — AB (ref 13.0–17.7)
IMMATURE GRANS (ABS): 0 10*3/uL (ref 0.0–0.1)
Immature Granulocytes: 0 %
LYMPHS ABS: 1.6 10*3/uL (ref 0.7–3.1)
LYMPHS: 19 %
MCH: 32.4 pg (ref 26.6–33.0)
MCHC: 35 g/dL (ref 31.5–35.7)
MCV: 92 fL (ref 79–97)
Monocytes Absolute: 0.9 10*3/uL (ref 0.1–0.9)
Monocytes: 10 %
NEUTROS PCT: 69 %
Neutrophils Absolute: 5.8 10*3/uL (ref 1.4–7.0)
Platelets: 154 10*3/uL (ref 150–379)
RBC: 5.56 x10E6/uL (ref 4.14–5.80)
RDW: 15 % (ref 12.3–15.4)
WBC: 8.5 10*3/uL (ref 3.4–10.8)

## 2017-07-04 LAB — HEPATIC FUNCTION PANEL
ALK PHOS: 58 IU/L (ref 39–117)
ALT: 34 IU/L (ref 0–44)
AST: 39 IU/L (ref 0–40)
Albumin: 3.7 g/dL (ref 3.5–4.8)
Bilirubin Total: 1.7 mg/dL — ABNORMAL HIGH (ref 0.0–1.2)
Bilirubin, Direct: 0.31 mg/dL (ref 0.00–0.40)
Total Protein: 6.5 g/dL (ref 6.0–8.5)

## 2017-07-04 LAB — BMP8+EGFR
BUN/Creatinine Ratio: 12 (ref 10–24)
BUN: 15 mg/dL (ref 8–27)
CALCIUM: 9.5 mg/dL (ref 8.6–10.2)
CO2: 23 mmol/L (ref 20–29)
CREATININE: 1.24 mg/dL (ref 0.76–1.27)
Chloride: 103 mmol/L (ref 96–106)
GFR calc Af Amer: 66 mL/min/{1.73_m2} (ref >59)
GFR, EST NON AFRICAN AMERICAN: 57 mL/min/{1.73_m2} — AB (ref >59)
Glucose: 87 mg/dL (ref 65–99)
Potassium: 3.6 mmol/L (ref 3.5–5.2)
SODIUM: 143 mmol/L (ref 134–144)

## 2017-07-04 LAB — VITAMIN B12: Vitamin B-12: 205 pg/mL — ABNORMAL LOW (ref 232–1245)

## 2017-07-04 LAB — LIPID PANEL
CHOL/HDL RATIO: 3.7 ratio (ref 0.0–5.0)
Cholesterol, Total: 133 mg/dL (ref 100–199)
HDL: 36 mg/dL — AB (ref >39)
LDL CALC: 55 mg/dL (ref 0–99)
TRIGLYCERIDES: 210 mg/dL — AB (ref 0–149)
VLDL CHOLESTEROL CAL: 42 mg/dL — AB (ref 5–40)

## 2017-07-04 LAB — THYROID PANEL WITH TSH
Free Thyroxine Index: 1.8 (ref 1.2–4.9)
T3 UPTAKE RATIO: 21 % — AB (ref 24–39)
T4 TOTAL: 8.4 ug/dL (ref 4.5–12.0)
TSH: 2.23 u[IU]/mL (ref 0.450–4.500)

## 2017-07-04 LAB — VITAMIN D 25 HYDROXY (VIT D DEFICIENCY, FRACTURES): Vit D, 25-Hydroxy: 38.1 ng/mL (ref 30.0–100.0)

## 2017-07-08 ENCOUNTER — Ambulatory Visit (INDEPENDENT_AMBULATORY_CARE_PROVIDER_SITE_OTHER): Payer: Medicare Other | Admitting: *Deleted

## 2017-07-08 DIAGNOSIS — E538 Deficiency of other specified B group vitamins: Secondary | ICD-10-CM

## 2017-07-08 MED ORDER — CYANOCOBALAMIN 1000 MCG/ML IJ SOLN
1000.0000 ug | INTRAMUSCULAR | Status: AC
  Administered 2017-07-08 – 2017-07-29 (×4): 1000 ug via INTRAMUSCULAR

## 2017-07-08 NOTE — Progress Notes (Signed)
Pt given Cyanocobalamin inj Tolerated well

## 2017-07-15 ENCOUNTER — Ambulatory Visit (INDEPENDENT_AMBULATORY_CARE_PROVIDER_SITE_OTHER): Payer: Medicare Other | Admitting: *Deleted

## 2017-07-15 DIAGNOSIS — E538 Deficiency of other specified B group vitamins: Secondary | ICD-10-CM

## 2017-07-15 DIAGNOSIS — Z23 Encounter for immunization: Secondary | ICD-10-CM | POA: Diagnosis not present

## 2017-07-15 NOTE — Progress Notes (Signed)
Pt given flu vaccine and Cyanocobalamin inj Tolerated well

## 2017-07-16 ENCOUNTER — Other Ambulatory Visit: Payer: Self-pay | Admitting: Family Medicine

## 2017-07-22 ENCOUNTER — Ambulatory Visit (INDEPENDENT_AMBULATORY_CARE_PROVIDER_SITE_OTHER): Payer: Medicare Other

## 2017-07-22 DIAGNOSIS — E538 Deficiency of other specified B group vitamins: Secondary | ICD-10-CM

## 2017-07-29 ENCOUNTER — Ambulatory Visit (INDEPENDENT_AMBULATORY_CARE_PROVIDER_SITE_OTHER): Payer: Medicare Other | Admitting: *Deleted

## 2017-07-29 DIAGNOSIS — E538 Deficiency of other specified B group vitamins: Secondary | ICD-10-CM | POA: Diagnosis not present

## 2017-07-29 NOTE — Progress Notes (Signed)
Pt given Cyanocobalamin inj Tolerated well

## 2017-08-07 ENCOUNTER — Encounter (HOSPITAL_COMMUNITY): Payer: Medicare Other

## 2017-08-09 ENCOUNTER — Encounter (HOSPITAL_COMMUNITY): Payer: Medicare Other

## 2017-08-09 ENCOUNTER — Encounter (HOSPITAL_COMMUNITY): Payer: Medicare Other | Attending: Oncology | Admitting: Oncology

## 2017-08-09 ENCOUNTER — Encounter (HOSPITAL_COMMUNITY): Payer: Self-pay | Admitting: Oncology

## 2017-08-09 VITALS — BP 159/76 | HR 113 | Temp 98.2°F | Resp 20 | Ht 68.0 in | Wt 247.0 lb

## 2017-08-09 DIAGNOSIS — K7689 Other specified diseases of liver: Secondary | ICD-10-CM | POA: Insufficient documentation

## 2017-08-09 DIAGNOSIS — Z9079 Acquired absence of other genital organ(s): Secondary | ICD-10-CM | POA: Insufficient documentation

## 2017-08-09 DIAGNOSIS — Z7982 Long term (current) use of aspirin: Secondary | ICD-10-CM | POA: Diagnosis not present

## 2017-08-09 DIAGNOSIS — D45 Polycythemia vera: Secondary | ICD-10-CM

## 2017-08-09 DIAGNOSIS — I10 Essential (primary) hypertension: Secondary | ICD-10-CM | POA: Insufficient documentation

## 2017-08-09 DIAGNOSIS — E538 Deficiency of other specified B group vitamins: Secondary | ICD-10-CM | POA: Diagnosis not present

## 2017-08-09 DIAGNOSIS — M109 Gout, unspecified: Secondary | ICD-10-CM | POA: Insufficient documentation

## 2017-08-09 DIAGNOSIS — Z79899 Other long term (current) drug therapy: Secondary | ICD-10-CM | POA: Diagnosis not present

## 2017-08-09 DIAGNOSIS — K5090 Crohn's disease, unspecified, without complications: Secondary | ICD-10-CM | POA: Insufficient documentation

## 2017-08-09 DIAGNOSIS — Z87891 Personal history of nicotine dependence: Secondary | ICD-10-CM | POA: Diagnosis not present

## 2017-08-09 DIAGNOSIS — E785 Hyperlipidemia, unspecified: Secondary | ICD-10-CM | POA: Diagnosis not present

## 2017-08-09 DIAGNOSIS — Z8546 Personal history of malignant neoplasm of prostate: Secondary | ICD-10-CM | POA: Insufficient documentation

## 2017-08-09 DIAGNOSIS — Z8249 Family history of ischemic heart disease and other diseases of the circulatory system: Secondary | ICD-10-CM | POA: Insufficient documentation

## 2017-08-09 DIAGNOSIS — D751 Secondary polycythemia: Secondary | ICD-10-CM | POA: Insufficient documentation

## 2017-08-09 DIAGNOSIS — Z9889 Other specified postprocedural states: Secondary | ICD-10-CM | POA: Diagnosis not present

## 2017-08-09 DIAGNOSIS — R0602 Shortness of breath: Secondary | ICD-10-CM | POA: Diagnosis not present

## 2017-08-09 DIAGNOSIS — E559 Vitamin D deficiency, unspecified: Secondary | ICD-10-CM | POA: Diagnosis not present

## 2017-08-09 DIAGNOSIS — Z808 Family history of malignant neoplasm of other organs or systems: Secondary | ICD-10-CM | POA: Diagnosis not present

## 2017-08-09 DIAGNOSIS — K56609 Unspecified intestinal obstruction, unspecified as to partial versus complete obstruction: Secondary | ICD-10-CM | POA: Insufficient documentation

## 2017-08-09 DIAGNOSIS — Z888 Allergy status to other drugs, medicaments and biological substances status: Secondary | ICD-10-CM | POA: Insufficient documentation

## 2017-08-09 LAB — CBC
HCT: 51.1 % (ref 39.0–52.0)
HEMOGLOBIN: 17.8 g/dL — AB (ref 13.0–17.0)
MCH: 32.6 pg (ref 26.0–34.0)
MCHC: 34.8 g/dL (ref 30.0–36.0)
MCV: 93.6 fL (ref 78.0–100.0)
Platelets: 152 10*3/uL (ref 150–400)
RBC: 5.46 MIL/uL (ref 4.22–5.81)
RDW: 14.5 % (ref 11.5–15.5)
WBC: 9.6 10*3/uL (ref 4.0–10.5)

## 2017-08-09 NOTE — Patient Instructions (Signed)
[place] at [facility] Discharge Instructions  RECOMMENDATIONS MADE BY THE CONSULTANT AND ANY TEST RESULTS WILL BE SENT TO YOUR REFERRING PHYSICIAN.  You saw Dr. Oliva Bustard today. See Andee Poles at front desk for appointments.  Thank you for choosing [place] at [facility] to provide your oncology and hematology care.  To afford each patient quality time with our provider, please arrive at least 15 minutes before your scheduled appointment time.    If you have a lab appointment with the [facility] please come in thru the  Main Entrance and check in at the main information desk  You need to re-schedule your appointment should you arrive 10 or more minutes late.  We strive to give you quality time with our providers, and arriving late affects you and other patients whose appointments are after yours.  Also, if you no show three or more times for appointments you may be dismissed from the clinic at the providers discretion.     Again, thank you for choosing [facility].  Our hope is that these requests will decrease the amount of time that you wait before being seen by our physicians.       _____________________________________________________________  Should you have questions after your visit to [facility], please contact our office at [phone] between the hours of 8:30 a.m. and 4:30 p.m.  Voicemails left after 4:30 p.m. will not be returned until the following business day.  For prescription refill requests, have your pharmacy contact our office.       Resources For Cancer Patients and their Caregivers ? American Cancer Society: Can assist with transportation, wigs, general needs, runs Look Good Feel Better.        7167257505 ? Cancer Care: Provides financial assistance, online support groups, medication/co-pay assistance.  1-800-813-HOPE 856-645-0187) ? [facility] Assists [place] cancer patients and their families through emotional , educational and financial support.  939-352-7630 ? [place] DSS Where to apply for food stamps, Medicaid and utility assistance. (469)119-8202 ? [facility]: Transportation to medical appointments. (304)652-1971 ? Social Security Administration: May apply for disability if have a Stage IV cancer. 684-709-7864 4844068617 ? [employer], Disability and Transit Services: Assists with nutrition, care and transit needs. [place] Support Programs: @10RELATIVEDAYS @ > Cancer Support Group  2nd Tuesday of the month 1pm-2pm, Journey Room  > Creative Journey  3rd Tuesday of the month 1130am-1pm, Journey Room  > Look Good Feel Better  1st Wednesday of the month 10am-12 noon, Journey Room (Call [facility] to register [phone])

## 2017-08-09 NOTE — Progress Notes (Signed)
[facility] Initial Visit:  Patient Care Team: Chipper Herb, MD as PCP - General (Family Medicine) Lafayette Dragon, MD (Inactive) as Consulting Physician (Gastroenterology) Curt Bears, MD as Consulting Physician (Oncology) Fanny Skates, MD as Consulting Physician (General Surgery) Herminio Commons, MD as Consulting Physician (Cardiology) Irine Seal, MD as Attending Physician (Urology)  CHIEF COMPLAINTS/PURPOSE OF CONSULTATION:   No history exists.    HISTORY OF PRESENTING ILLNESS: Luis Deleon 76 y.o. male is here because of  Polycythemia.  Patient had some workup done for 5 years ago detailed report not available.  First label as reactive polycythemia. 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  For last year and half patient has not donated any blood as patient is taking care of his wife. Ex smoker.  Has smoked 30 years ago. 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  PSA has been reported to be normal by patient. Continues to feel weak and tired.  Mild shortness of breath fatigue.  Review of Systems - Oncology GENERAL:  Feels fatigued.  Taking care of his wife who is sick with malignancy  Active.  No fevers, sweats or weight loss. PERFORMANCE STATUS (ECOG):  0 HEENT:  No visual changes, runny nose, sore throat, mouth sores or tenderness. Lungs:  shortness of breath or cough.  No hemoptysis. Cardiac:  No chest pain, palpitations, orthopnea, or PND. GI:  No nausea, vomiting, diarrhea, constipation, melena or hematochezia. GU:  No urgency, frequency, dysuria, or hematuria. Musculoskeletal:  No back pain.  No joint pain.  No muscle tenderness. Extremities:  No pain or swelling. Skin:  No rashes or skin changes. Neuro:  No headache, numbness or weakness, balance or coordination issues. Endocrine:  No diabetes, thyroid issues, hot flashes or night  sweats. Psych:  No mood changes, depression or anxiety. Pain:  No focal pain. Review of systems:  All other systems reviewed and found to be negative.  MEDICAL HISTORY: Past Medical History:  Diagnosis Date  . Abnormal LFTs (liver function tests)   . Arthritis   . Crohn disease ([place])   . Diverticulosis   . Fatty liver   . Gout   . Hepatic cyst   . Hyperlipemia   . Hyperplastic colon polyp 2012  . Hypertension   . Nephrolithiasis   . Polycythemia   . Prostate cancer ([place])    8 or 9 years  . Small bowel obstruction ([place]) 2003  . Vitamin D deficiency     SURGICAL HISTORY: Past Surgical History:  Procedure Laterality Date  . APPENDECTOMY  2003  . BACTERIAL OVERGROWTH TEST N/A 06/15/2016   Procedure: BACTERIAL OVERGROWTH TEST;  Surgeon: Md Physician Gastroenterology, MD;  Location: [facility];  Service: Gastroenterology;  Laterality: N/A;  . EXPLORATORY LAPAROTOMY W/ BOWEL RESECTION  2003  . LITHOTRIPSY  1980  . PROSTATECTOMY    . TONSILLECTOMY    . VENTRAL HERNIA REPAIR     2 times    SOCIAL HISTORY: Social History   Social History  . Marital status: Married    Spouse name: N/A  . Number of children: 1  . Years of education: N/A   Occupational History  . Retired      Event organiser   Social History Main Topics  . Smoking status: Former Smoker    Packs/day: 1.00    Years: 38.00    Types: Cigarettes    Quit  date: 03/19/1991  . Smokeless tobacco: Current User    Types: Chew  . Alcohol use No  . Drug use: No  . Sexual activity: No   Other Topics Concern  . Not on file   Social History Narrative  . No narrative on file    FAMILY HISTORY Family History  Problem Relation Age of Onset  . Heart disease Mother   . Cervical cancer Mother   . Heart disease Father   . Heart failure Father   . Dislocations Daughter 8       Bilateral knee  . Heart disease Maternal Uncle   . Colon cancer Neg Hx     ALLERGIES:  is allergic to colchicine.  MEDICATIONS:   Current Outpatient Prescriptions  Medication Sig Dispense Refill  . allopurinol (ZYLOPRIM) 100 MG tablet TAKE 2 TABLETS DAILY AS DIRECTED 180 tablet 0  . aspirin 81 MG EC tablet Take 81 mg by mouth daily.      Marland Kitchen atorvastatin (LIPITOR) 40 MG tablet TAKE 1 TABLET EVERY DAY AS DIRECTED 90 tablet 3  . Cholecalciferol (VITAMIN D3) 1000 UNITS CAPS Take 2 capsules by mouth daily.     . fluticasone (FLONASE) 50 MCG/ACT nasal spray Place 2 sprays into both nostrils daily. 16 g 6  . gemfibrozil (LOPID) 600 MG tablet TAKE 1 TABLET BY MOUTH EVERY DAY AS DIRECTED 30 tablet 5  . hydrochlorothiazide (MICROZIDE) 12.5 MG capsule TAKE 1 TABLET (12.5 MG TOTAL) BY MOUTH DAILY. AS DIRECTED 90 capsule 1  . HYDROmorphone (DILAUDID) 2 MG tablet Take 1 tablet (2 mg total) by mouth every 4 (four) hours as needed for severe pain. 20 tablet 0  . hyoscyamine (LEVSIN SL) 0.125 MG SL tablet Place 2 tablets (0.25 mg total) under the tongue every 6 (six) hours as needed. 60 tablet 2  . KLOR-CON M20 20 MEQ tablet TAKE 1 TABLET TWICE DAILY ON MON, WED AND FRIDAY-TAKE 1 TABLET DAILY EVERY OTHER DAY 40 tablet 0  . losartan (COZAAR) 100 MG tablet TAKE 1 TABLET BY MOUTH EVERY DAY AS DIRECTED 90 tablet 0  . mupirocin ointment (BACTROBAN) 2 % Place 1 application into the nose 2 (two) times daily. 22 g 1  . nystatin-triamcinolone (MYCOLOG II) cream Apply 1 application topically 2 (two) times daily. 30 g 3  . OMEGA-3 1000 MG CAPS Take 2 capsules by mouth 2 (two) times daily.     No current facility-administered medications for this visit.     PHYSICAL EXAMINATION:  ECOG PERFORMANCE STATUS: 0 - Asymptomatic GENERAL:  Well developed, well nourished, sitting comfortably in the exam room in no acute distress. Moderately obese individual not any acute distress MENTAL STATUS:  Alert and oriented to person, place and time. HEAD:    Normocephalic, atraumatic, face symmetric, no Cushingoid features. EYES:    Pupils equal round and  reactive to light and accomodation.  No conjunctivitis or scleral icterus. ENT:  Oropharynx clear without lesion.  Tongue normal. Mucous membranes moist.  RESPIRATORY:  Clear to auscultation without rales, wheezes or rhonchi. CARDIOVASCULAR:  Regular rate and rhythm without murmur, rub or gallop. BREAST:  Right breast without masses, skin changes or nipple discharge.  Left breast without masses, skin changes or nipple discharge. ABDOMEN:  Soft, non-tender, with active bowel sounds, and no hepatosplenomegaly.  No masses.  Multiple abdominal surgery  BACK:  No CVA tenderness.  No tenderness on percussion of the back or rib cage. SKIN:  No rashes, ulcers or lesions. EXTREMITIES: No edema, no  skin discoloration or tenderness.  No palpable cords. LYMPH NODES: No palpable cervical, supraclavicular, axillary or inguinal adenopathy  NEUROLOGICAL: Unremarkable. PSYCH:  Appropriate.  Vitals:   08/09/17 1257  BP: (!) 159/76  Pulse: (!) 113  Resp: 20  Temp: 98.2 F (36.8 C)  SpO2: 97%    Filed Weights   08/09/17 1257  Weight: 247 lb (112 kg)     Physical Exam As described above  LABORATORY DATA: I have personally reviewed the data as listed:  No visits with results within 1 Month(s) from this visit.  Latest known visit with results is:  Office Visit on 07/03/2017  Component Date Value Ref Range Status  . WBC 07/03/2017 8.5  3.4 - 10.8 x10E3/uL Final  . RBC 07/03/2017 5.56  4.14 - 5.80 x10E6/uL Final  . Hemoglobin 07/03/2017 18.0* 13.0 - 17.7 g/dL Final  . Hematocrit 07/03/2017 51.4* 37.5 - 51.0 % Final  . MCV 07/03/2017 92  79 - 97 fL Final  . MCH 07/03/2017 32.4  26.6 - 33.0 pg Final  . MCHC 07/03/2017 35.0  31.5 - 35.7 g/dL Final  . RDW 07/03/2017 15.0  12.3 - 15.4 % Final  . Platelets 07/03/2017 154  150 - 379 x10E3/uL Final  . Neutrophils 07/03/2017 69  Not Estab. % Final  . Lymphs 07/03/2017 19  Not Estab. % Final  . Monocytes 07/03/2017 10  Not Estab. % Final  . Eos  07/03/2017 2  Not Estab. % Final  . Basos 07/03/2017 0  Not Estab. % Final  . Neutrophils Absolute 07/03/2017 5.8  1.4 - 7.0 x10E3/uL Final  . Lymphocytes Absolute 07/03/2017 1.6  0.7 - 3.1 x10E3/uL Final  . Monocytes Absolute 07/03/2017 0.9  0.1 - 0.9 x10E3/uL Final  . EOS (ABSOLUTE) 07/03/2017 0.2  0.0 - 0.4 x10E3/uL Final  . Basophils Absolute 07/03/2017 0.0  0.0 - 0.2 x10E3/uL Final  . Immature Granulocytes 07/03/2017 0  Not Estab. % Final  . Immature Grans (Abs) 07/03/2017 0.0  0.0 - 0.1 x10E3/uL Final  . Glucose 07/03/2017 87  65 - 99 mg/dL Final  . BUN 07/03/2017 15  8 - 27 mg/dL Final  . Creatinine, Ser 07/03/2017 1.24  0.76 - 1.27 mg/dL Final  . GFR calc non Af Amer 07/03/2017 57* >59 mL/min/1.73 Final  . GFR calc Af Amer 07/03/2017 66  >59 mL/min/1.73 Final  . BUN/Creatinine Ratio 07/03/2017 12  10 - 24 Final  . Sodium 07/03/2017 143  134 - 144 mmol/L Final  . Potassium 07/03/2017 3.6  3.5 - 5.2 mmol/L Final  . Chloride 07/03/2017 103  96 - 106 mmol/L Final  . CO2 07/03/2017 23  20 - 29 mmol/L Final  . Calcium 07/03/2017 9.5  8.6 - 10.2 mg/dL Final  . Cholesterol, Total 07/03/2017 133  100 - 199 mg/dL Final  . Triglycerides 07/03/2017 210* 0 - 149 mg/dL Final  . HDL 07/03/2017 36* >39 mg/dL Final  . VLDL Cholesterol Cal 07/03/2017 42* 5 - 40 mg/dL Final  . LDL Calculated 07/03/2017 55  0 - 99 mg/dL Final  . Chol/HDL Ratio 07/03/2017 3.7  0.0 - 5.0 ratio Final   Comment:                                   T. Chol/HDL Ratio  Men  Women                               1/2 Avg.Risk  3.4    3.3                                   Avg.Risk  5.0    4.4                                2X Avg.Risk  9.6    7.1                                3X Avg.Risk 23.4   11.0   . Vit D, 25-Hydroxy 07/03/2017 38.1  30.0 - 100.0 ng/mL Final   Comment: Vitamin D deficiency has been defined by the [place] practice  guideline as a level of serum 25-OH vitamin D less than 20 ng/mL (1,2). The Endocrine Society went on to further define vitamin D insufficiency as a level between 21 and 29 ng/mL (2). 1. IOM (Institute of Medicine). 2010. Dietary reference    intakes for calcium and D. [place]: The    [employer]. 2. Holick MF, Binkley [place], Bischoff-Ferrari HA, et al.    Evaluation, treatment, and prevention of vitamin D    deficiency: an Endocrine Society clinical practice    guideline. JCEM. 2011 Jul; 96(7):1911-30.   Marland Kitchen Total Protein 07/03/2017 6.5  6.0 - 8.5 g/dL Final  . Albumin 07/03/2017 3.7  3.5 - 4.8 g/dL Final  . Bilirubin Total 07/03/2017 1.7* 0.0 - 1.2 mg/dL Final  . Bilirubin, Direct 07/03/2017 0.31  0.00 - 0.40 mg/dL Final  . Alkaline Phosphatase 07/03/2017 58  39 - 117 IU/L Final  . AST 07/03/2017 39  0 - 40 IU/L Final  . ALT 07/03/2017 34  0 - 44 IU/L Final  . TSH 07/03/2017 2.230  0.450 - 4.500 uIU/mL Final  . T4, Total 07/03/2017 8.4  4.5 - 12.0 ug/dL Final  . T3 Uptake Ratio 07/03/2017 21* 24 - 39 % Final  . Free Thyroxine Index 07/03/2017 1.8  1.2 - 4.9 Final  . Vitamin B-12 07/03/2017 205* 232 - 1,245 pg/mL Final    RADIOGRAPHIC STUDIES: I have personally reviewed the radiological images as listed and agree with the findings in the report  No results found.  ASSESSMENT/PLAN Polycythemia Most likely reactive however I have ordered  JAK2 mutation as well as PCR ABL Patient had some workup done for 5 years ago at best a long [place] but detailed reports are not available at present time If patient has a reactive polycythemia I do not see any need for therapeutic phlebotomy unless patient becomes symptomatic If patient has checked to mutation then further evaluation with bone marrow aspiration biopsy and possible regular phlebotomy can be considered and if needed Hydrea can be used.  2.  Vitamin B12 deficiency.  Patient is getting regular injection of  vitamin B12.  Patient had previous multiple abdominal surgery because of Crohn's disease Return evaluation in [place] Beckey Polkowski, MD  08/09/2017 1:20 PM

## 2017-08-14 ENCOUNTER — Other Ambulatory Visit: Payer: Self-pay | Admitting: Family Medicine

## 2017-08-14 LAB — BCR-ABL1, CML/ALL, PCR, QUANT

## 2017-08-20 LAB — JAK2 V617F, W REFLEX TO CALR/E12/MPL

## 2017-08-20 LAB — CALR + JAK2 E12-15 + MPL (REFLEXED)

## 2017-08-21 ENCOUNTER — Ambulatory Visit (HOSPITAL_COMMUNITY)
Admission: RE | Admit: 2017-08-21 | Discharge: 2017-08-21 | Disposition: A | Discharge status: Home / Self Care | Payer: Medicare Other | Source: Ambulatory Visit | Attending: Oncology | Admitting: Oncology

## 2017-08-21 DIAGNOSIS — I700 Atherosclerosis of aorta: Secondary | ICD-10-CM | POA: Insufficient documentation

## 2017-08-21 DIAGNOSIS — D45 Polycythemia vera: Secondary | ICD-10-CM | POA: Diagnosis not present

## 2017-08-21 DIAGNOSIS — N281 Cyst of kidney, acquired: Secondary | ICD-10-CM | POA: Insufficient documentation

## 2017-08-21 DIAGNOSIS — K8020 Calculus of gallbladder without cholecystitis without obstruction: Secondary | ICD-10-CM | POA: Diagnosis not present

## 2017-08-21 DIAGNOSIS — K7689 Other specified diseases of liver: Secondary | ICD-10-CM | POA: Insufficient documentation

## 2017-08-23 ENCOUNTER — Encounter (HOSPITAL_COMMUNITY): Payer: Medicare Other | Attending: Oncology | Admitting: Oncology

## 2017-08-23 ENCOUNTER — Encounter (HOSPITAL_COMMUNITY): Payer: Self-pay | Admitting: Oncology

## 2017-08-23 VITALS — BP 123/64 | HR 94 | Temp 98.2°F | Resp 16 | Wt 245.0 lb

## 2017-08-23 DIAGNOSIS — K8080 Other cholelithiasis without obstruction: Secondary | ICD-10-CM

## 2017-08-23 DIAGNOSIS — E785 Hyperlipidemia, unspecified: Secondary | ICD-10-CM | POA: Insufficient documentation

## 2017-08-23 DIAGNOSIS — E538 Deficiency of other specified B group vitamins: Secondary | ICD-10-CM

## 2017-08-23 DIAGNOSIS — E559 Vitamin D deficiency, unspecified: Secondary | ICD-10-CM | POA: Insufficient documentation

## 2017-08-23 DIAGNOSIS — D751 Secondary polycythemia: Secondary | ICD-10-CM

## 2017-08-23 DIAGNOSIS — Z8546 Personal history of malignant neoplasm of prostate: Secondary | ICD-10-CM | POA: Insufficient documentation

## 2017-08-23 DIAGNOSIS — K5090 Crohn's disease, unspecified, without complications: Secondary | ICD-10-CM | POA: Insufficient documentation

## 2017-08-23 DIAGNOSIS — I10 Essential (primary) hypertension: Secondary | ICD-10-CM | POA: Insufficient documentation

## 2017-08-23 DIAGNOSIS — Z7982 Long term (current) use of aspirin: Secondary | ICD-10-CM | POA: Insufficient documentation

## 2017-08-23 DIAGNOSIS — Z79899 Other long term (current) drug therapy: Secondary | ICD-10-CM | POA: Insufficient documentation

## 2017-08-23 DIAGNOSIS — Z9079 Acquired absence of other genital organ(s): Secondary | ICD-10-CM | POA: Insufficient documentation

## 2017-08-23 DIAGNOSIS — Z9889 Other specified postprocedural states: Secondary | ICD-10-CM | POA: Insufficient documentation

## 2017-08-23 DIAGNOSIS — Z888 Allergy status to other drugs, medicaments and biological substances status: Secondary | ICD-10-CM | POA: Insufficient documentation

## 2017-08-23 DIAGNOSIS — K56609 Unspecified intestinal obstruction, unspecified as to partial versus complete obstruction: Secondary | ICD-10-CM | POA: Insufficient documentation

## 2017-08-23 DIAGNOSIS — Z808 Family history of malignant neoplasm of other organs or systems: Secondary | ICD-10-CM | POA: Insufficient documentation

## 2017-08-23 DIAGNOSIS — Z8249 Family history of ischemic heart disease and other diseases of the circulatory system: Secondary | ICD-10-CM | POA: Insufficient documentation

## 2017-08-23 DIAGNOSIS — R0602 Shortness of breath: Secondary | ICD-10-CM | POA: Insufficient documentation

## 2017-08-23 DIAGNOSIS — M109 Gout, unspecified: Secondary | ICD-10-CM | POA: Insufficient documentation

## 2017-08-23 DIAGNOSIS — Z87891 Personal history of nicotine dependence: Secondary | ICD-10-CM | POA: Insufficient documentation

## 2017-08-23 DIAGNOSIS — K7689 Other specified diseases of liver: Secondary | ICD-10-CM | POA: Insufficient documentation

## 2017-08-23 NOTE — Patient Instructions (Signed)
[place] at [facility] Discharge Instructions  RECOMMENDATIONS MADE BY THE CONSULTANT AND ANY TEST RESULTS WILL BE SENT TO YOUR REFERRING PHYSICIAN.  Seen by Dr. Jeb Levering today. No further appointments needed.    Thank you for choosing [place] at [facility] to provide your oncology and hematology care.  To afford each patient quality time with our provider, please arrive at least 15 minutes before your scheduled appointment time.    If you have a lab appointment with the [place] please come in thru the  Main Entrance and check in at the main information desk  You need to re-schedule your appointment should you arrive 10 or more minutes late.  We strive to give you quality time with our providers, and arriving late affects you and other patients whose appointments are after yours.  Also, if you no show three or more times for appointments you may be dismissed from the clinic at the providers discretion.     Again, thank you for choosing [facility]  Our hope is that these requests will decrease the amount of time that you wait before being seen by our physicians.       _____________________________________________________________  Should you have questions after your visit to [facility], please contact our office at [phone] between the hours of 8:30 a.m. and 4:30 p.m.  Voicemails left after 4:30 p.m. will not be returned until the following business day.  For prescription refill requests, have your pharmacy contact our office.       Resources For Cancer Patients and their Caregivers ? American Cancer Society: Can assist with transportation, wigs, general needs, runs Look Good Feel Better.        (902)049-2805 ? Cancer Care: Provides financial assistance, online support groups, medication/co-pay assistance.  1-800-813-HOPE (713) 083-4266) ? [place] Assists [place] cancer  patients and their families through emotional , educational and financial support.  480-702-5983 ? [place] DSS Where to apply for food stamps, Medicaid and utility assistance. 252-002-2528 ? [facility]: Transportation to medical appointments. (534) 380-1240 ? Social Security Administration: May apply for disability if have a Stage IV cancer. 845-348-0961 (705)047-1630 ? [employer], Disability and Transit Services: Assists with nutrition, care and transit needs. [place] Support Programs: @10RELATIVEDAYS @ > Cancer Support Group  2nd Tuesday of the month 1pm-2pm, Journey Room  > Creative Journey  3rd Tuesday of the month 1130am-1pm, Journey Room  > Look Good Feel Better  1st Wednesday of the month 10am-12 noon, Journey Room (Call [place] to register [phone])

## 2017-08-23 NOTE — Progress Notes (Signed)
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 male is here because of  Polycythemia.  Patient had some workup done for 5 years ago detailed report not available.  First label as reactive polycythemia. 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  For last year and half patient has not donated any blood as patient is taking care of his wife. Ex smoker.  Has smoked 30 years ago. 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  PSA has been reported to be normal by patient. Continues to feel weak and tired.  Mild shortness of breath fatigue.  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  PCL ABR negative  Review of Systems - Oncology GENERAL:  Feels fatigued.  Taking care of his wife who is sick with malignancy  Active.  No fevers, sweats or weight loss. PERFORMANCE STATUS (ECOG):  0 HEENT:  No visual changes, runny nose, sore throat, mouth sores or tenderness. Lungs:  shortness of breath or cough.  No hemoptysis. Cardiac:  No chest pain, palpitations, orthopnea, or PND. GI:  No nausea, vomiting, diarrhea, constipation, melena or hematochezia. GU:  No urgency, frequency, dysuria, or hematuria. Musculoskeletal:  No back pain.  No joint  pain.  No muscle tenderness. Extremities:  No pain or swelling. Skin:  No rashes or skin changes. Neuro:  No headache, numbness or weakness, balance or coordination issues. Endocrine:  No diabetes, thyroid issues, hot flashes or night sweats. Psych:  No mood changes, depression or anxiety. Pain:  No focal pain. Review of systems:  All other systems reviewed and found to be negative.  MEDICAL HISTORY: Past Medical History:  Diagnosis Date  . Abnormal LFTs (liver function tests)   . Arthritis   . Crohn disease ([place])   . Diverticulosis   . Fatty liver   . Gout   . Hepatic cyst   . Hyperlipemia   . Hyperplastic colon polyp 2012  . Hypertension   . Nephrolithiasis   . Polycythemia   . Prostate cancer ([place])    8 or 9 years  . Small bowel obstruction ([place]) 2003  . Vitamin D deficiency     SURGICAL HISTORY: Past Surgical History:  Procedure Laterality Date  . APPENDECTOMY  2003  . EXPLORATORY LAPAROTOMY W/ BOWEL RESECTION  2003  . LITHOTRIPSY  1980  . PROSTATECTOMY    . TONSILLECTOMY    . VENTRAL HERNIA REPAIR     2 times    SOCIAL HISTORY: Social History   Socioeconomic History  . Marital status: Married    Spouse name: Not on file  . Number of children: 1  . Years of education: Not on file  . Highest education level: Not on file  Social Needs  . Financial resource strain: Not on file  . Food insecurity - worry: Not on file  . Food insecurity - inability: Not on  file  . Transportation needs - medical: Not on file  . Transportation needs - non-medical: Not on file  Occupational History  . Occupation: Retired     Comment: Event organiser  Tobacco Use  . Smoking status: Former Smoker    Packs/day: 1.00    Years: 38.00    Pack years: 38.00    Types: Cigarettes    Last attempt to quit: 03/19/1991    Years since quitting: 26.4  . Smokeless tobacco: Current User    Types: Chew  Substance and Sexual Activity  . Alcohol use: No  . Drug use: No  . Sexual  activity: No  Other Topics Concern  . Not on file  Social History Narrative  . Not on file    FAMILY HISTORY Family History  Problem Relation Age of Onset  . Heart disease Mother   . Cervical cancer Mother   . Heart disease Father   . Heart failure Father   . Dislocations Daughter 8       Bilateral knee  . Heart disease Maternal Uncle   . Colon cancer Neg Hx     ALLERGIES:  is allergic to colchicine.  MEDICATIONS:  Current Outpatient Medications  Medication Sig Dispense Refill  . allopurinol (ZYLOPRIM) 100 MG tablet TAKE 2 TABLETS DAILY AS DIRECTED 180 tablet 0  . aspirin 81 MG EC tablet Take 81 mg by mouth daily.      Marland Kitchen atorvastatin (LIPITOR) 40 MG tablet TAKE 1 TABLET EVERY DAY AS DIRECTED 90 tablet 3  . Cholecalciferol (VITAMIN D3) 1000 UNITS CAPS Take 2 capsules by mouth daily.     . fluticasone (FLONASE) 50 MCG/ACT nasal spray Place 2 sprays into both nostrils daily. 16 g 6  . gemfibrozil (LOPID) 600 MG tablet TAKE 1 TABLET BY MOUTH EVERY DAY AS DIRECTED 30 tablet 5  . hydrochlorothiazide (MICROZIDE) 12.5 MG capsule TAKE 1 TABLET (12.5 MG TOTAL) BY MOUTH DAILY. AS DIRECTED 90 capsule 1  . HYDROmorphone (DILAUDID) 2 MG tablet Take 1 tablet (2 mg total) by mouth every 4 (four) hours as needed for severe pain. 20 tablet 0  . hyoscyamine (LEVSIN SL) 0.125 MG SL tablet Place 2 tablets (0.25 mg total) under the tongue every 6 (six) hours as needed. 60 tablet 2  . KLOR-CON M20 20 MEQ tablet TAKE 1 TABLET TWICE DAILY ON MON, WED AND FRIDAY-TAKE 1 TABLET DAILY EVERY OTHER DAY 40 tablet 0  . losartan (COZAAR) 100 MG tablet TAKE 1 TABLET BY MOUTH EVERY DAY AS DIRECTED 90 tablet 0  . mupirocin ointment (BACTROBAN) 2 % Place 1 application into the nose 2 (two) times daily. 22 g 1  . nystatin-triamcinolone (MYCOLOG II) cream Apply 1 application topically 2 (two) times daily. 30 g 3  . OMEGA-3 1000 MG CAPS Take 2 capsules by mouth 2 (two) times daily.     No current  facility-administered medications for this visit.     PHYSICAL EXAMINATION:  ECOG PERFORMANCE STATUS: 0 - Asymptomatic GENERAL:  Well developed, well nourished, sitting comfortably in the exam room in no acute distress. Moderately obese individual not any acute distress MENTAL STATUS:  Alert and oriented to person, place and time. HEAD:    Normocephalic, atraumatic, face symmetric, no Cushingoid features. EYES:    Pupils equal round and reactive to light and accomodation.  No conjunctivitis or scleral icterus. ENT:  Oropharynx clear without lesion.  Tongue normal. Mucous membranes moist.  RESPIRATORY:  Clear to auscultation without rales, wheezes or rhonchi.  CARDIOVASCULAR:  Regular rate and rhythm without murmur, rub or gallop. BREAST:  Right breast without masses, skin changes or nipple discharge.  Left breast without masses, skin changes or nipple discharge. ABDOMEN:  Soft, non-tender, with active bowel sounds, and no hepatosplenomegaly.  No masses.  Multiple abdominal surgery  BACK:  No CVA tenderness.  No tenderness on percussion of the back or rib cage. SKIN:  No rashes, ulcers or lesions. EXTREMITIES: No edema, no skin discoloration or tenderness.  No palpable cords. LYMPH NODES: No palpable cervical, supraclavicular, axillary or inguinal adenopathy  NEUROLOGICAL: Unremarkable. PSYCH:  Appropriate.  There were no vitals filed for this visit.  There were no vitals filed for this visit.   Physical Exam As described above  LABORATORY DATA: I have personally reviewed the data as listed:  Appointment on 08/09/2017  Component Date Value Ref Range Status  . WBC 08/09/2017 9.6  4.0 - 10.5 K/uL Final  . RBC 08/09/2017 5.46  4.22 - 5.81 MIL/uL Final  . Hemoglobin 08/09/2017 17.8* 13.0 - 17.0 g/dL Final  . HCT 08/09/2017 51.1  39.0 - 52.0 % Final  . MCV 08/09/2017 93.6  78.0 - 100.0 fL Final  . MCH 08/09/2017 32.6  26.0 - 34.0 pg Final  . MCHC 08/09/2017 34.8  30.0 - 36.0 g/dL  Final  . RDW 08/09/2017 14.5  11.5 - 15.5 % Final  . Platelets 08/09/2017 152  150 - 400 K/uL Final  Office Visit on 08/09/2017  Component Date Value Ref Range Status  . JAK2 GenotypR 08/09/2017 Comment   Final   Comment: (NOTE) Result: NEGATIVE for the JAK2 V617F mutation. Interpretation:  The G to T nucleotide change encoding the V617F mutation was not detected.  This result does not rule out the presence of the JAK2 mutation at a level below the sensitivity of detection of this assay, or the presence of other mutations within JAK2 not detected by this assay.  This result does not rule out a diagnosis of polycythemia vera, essential thrombocythemia or idiopathic myelofibrosis as the V617F mutation is not detected in all patients with these disorders.   Marland Kitchen BACKGROUND: 08/09/2017 Comment   Final   Comment: (NOTE) JAK2 is a cytoplasmic tyrosine kinase with a key role in signal transduction from multiple hematopoietic growth factor receptors. A point mutation within exon 14 of the JAK2 gene ([id number]) encoding a valine to phenylalanine substitution at position 617 of the JAK2 protein (V617F) has been identified in most patients with polycythemia vera, and in about half of those with either essential thrombocythemia or idiopathic myelofibrosis. The V617F has also been detected, although infrequently, in other myeloid disorders such as chronic myelomonocytic leukemia and chronic neutrophilic luekemia. V617F is an acquired mutation that alters a highly conserved valine present in the negative regulatory JH2 domain of the JAK2 protein and is predicted to dysregulate kinase activity. Methodology: Total genomic DNA was extracted and subjected to TaqMan real-time PCR amplification/detection. Two amplification products per sample were monitored by real-time PCR using primers/probes s                          pecific to JAK2 wild type (WT) and JAK2 mutant V617F. The ABI7900  Absolute Quantitation software will compare the patient specimen valuse to the standard curves and generate percent values for wild type and mutant type. In vitro studies have indicated that this assay has an analytical sensitivity of 1%. References: Baxter EJ, Scott Phineas Real, et  al. Acquired mutation of the tyrosine kinase JAK2 in human myeloproliferative disorders. Lancet. 2005 Mar 19-25; 365(9464):1054-1061. Alfonso Ramus Couedic JP. A unique clonal JAK2 mutation leading to constitutive signaling causes polycythaemia vera. Nature. 2005 Apr 28; 434(7037):1144-1148. Kralovics R, Passamonti F, Buser AS, et al. A gain-of-function mutation of JAK2 in myeloproliferative disorders. N Engl J Med. 2005 Apr 28; 352(17):1779-1790.   . Director Review, JAK2 08/09/2017 Comment   Final   Comment: (NOTE) Constance Goltz, PhD, [facility]               Director, [place] for [place], [place]               [phone] This test was developed and its performance characteristics determined by [facility]. It has not been cleared or approved by the Food and Drug Administration.   Marland Kitchen REFLEX: 08/09/2017 Comment   Final   Comment: (NOTE) Reflex to CALR Mutation Analysis, JAK2 Exon 12-15 Mutation Analysis, and MPL Mutation Analysis is indicated.   Marland Kitchen Extraction 08/09/2017 Completed   Corrected   Comment: (NOTE) Performed At: [facility] [address] [place], [place] [id number] Nechama Guard MD CB:[id number] Performed At: [facility] [address] [place], [place] [id number] Nechama Guard MD NO:[id number]   . b2a2 transcript 08/09/2017 Comment  % Final   Comment: (NOTE)           <0.0032 % (sensitivity limit of assay)   . b3a2 transcript 08/09/2017 Comment  % Final   Comment: (NOTE)           <0.0032 % (sensitivity limit of assay)   . E1A2 Transcript  08/09/2017 Comment  % Final   Comment: (NOTE)           <0.0032 % (sensitivity limit of assay)   . Interpretation (BCRAL): 08/09/2017 Comment   Final   Comment: (NOTE) NEGATIVE for the BCR-ABL1 e1a2 (p190), e13a2 (b2a2, p210) and e14a2 (b3a2, p210) fusion transcripts. These results do not rule out the presence of rare BCR-ABL1 transcripts not detected by this assay.   . Director Review [facility]): 08/09/2017 Comment   Final   Comment: (NOTE) Constance Goltz, PhD, [facility]               Director, [place] for [place] and [place], [place]   . Background: 08/09/2017 Comment   Corrected   Comment: (NOTE) This assay can detect three different types of BCR-ABL1 fusion transcripts associated with CML, ALL, and AML: e13a2 (previously b2a2) and e14a2 (previously b3a2) (major breakpoint, p210), as well as e1a2 (minor breakpoint, p190). The e13a2 and e14a2 transcript values are titrated to the current International Scale (IS). The standardized baseline is 100% BCR-ABL1 (IS) and major molecular response (MMR) is equivalent to 0.1% BCR-ABL1 (IS) corresponding to a 3-log reduction. Results should be correlated with appropriate  clinical and laboratory information as indicated.   . Methodology 08/09/2017 Comment   Corrected   Comment: (NOTE) Total RNA is isolated from the sample and subject to a real-time, reverse transcriptase polymerase chain reaction (RT-PCR). The PCR primers and probes are specific for BCR-ABL1 e13a2, e14a2 and e1a2 fusion transcripts. The ABL1 transcript is amplified as the control for cDNA quantity and quality. Serial dilutions of a validated positive control RNA with known t(9;22) BCR-ABL1 are used as reference for quantification of BCR-ABL1 relative to ABL1. The numeric BCR-ABL1 level is reportd as % BCR-ABL1/ABL1 and the detection sensitivity is 4.5 log  below the standard baseline. This test was developed and its performance characteristics determined by [facility]. It has not been cleared or approved by the Food and Drug Administration. References:    1. Anastasia Fiedler and Branford S: Seminars in Hematology 2003;       40 (suppl2):62-68.    2. White HE, et al. Blood 2010; 116: e111-117.    3. NCCN Clinical Practice Guidelines in Oncology, Chronic       Myeloid Leukemia. V2. 2017.                           Performed At: [facility] [address] [place], [place] [id number] Nechama Guard MD UV:[id number] Performed At: [facility] [place], [place] [id number] Nechama Guard MD VF:[id number]   . CALR Mutation Detection Result 08/09/2017 Comment   Final   Comment: (NOTE) NEGATIVE No insertions or deletions were detected within the analyzed region of the calreticulin (CALR) gene. A negative result does not entirely exclude the possibility of a clonal population carrying CALR gene mutations that are not covered by this assay. Results should be interpreted in conjunction with clinical and laboratory findings for the most accurate interpretation.   . Background: 08/09/2017 Comment   Final   Comment: (NOTE) The calcium-binding endoplasmic reticulin chaperone protein, calreticulin (CALR), is somatically mutated in approximately 70% of patients with JAK2-negative essential thrombocythemia (ET) and 60- 88% of patients with JAK2-negative primary myelofibrosis(PMF). Only a minority of patients (approximately 8%) with myelodysplasia have mutations in  CALR gene. CALR mutations are rarely detected in patients with de novo acute myeloid leukemia, chronic myelogenous leukemia, lymphoid leukemia, or solid tumors. CALR mutations are not detected in polycythemia and generally appear to be mutually exclusive with JAK2 mutations and MPL mutations. The majority of mutational changes involve a variety of insertion  or deletion mutations in exon 9 of the calreticulin gene: approximately 53% of all CALR mutations are a 52 bp deletion (type-1) while the second most prevalent mutation (approximately 32%) contains a 5 bp insertion (type-2). Other mutations (non-type 1 or type 2) are seen                           in a small minority of cases. CALR mutations in PMF tend to be associated with a favorable prognosis compared to JAK2 V617F mutations, whereas primary myelofibrosis negative for CALR, JAK2 V617F and MPL mutations (so-called triple negative) is associated with a poor prognosis and shorter survival. The detection of a CALR gene mutation aids in the specific diagnosis of a myeloproliferative neoplasm, and help distinguish this clonal disease from a benign reactive process.   . Methodology: 08/09/2017 Comment   Final   Comment: (NOTE) Genomic DNA was isolated from the provided specimen. Polymerase chain reaction (PCR) of  exon 9 of the CALR gene was performed with specific fluorescent-labeled primers, and the PCR product was analyzed by capillary gel electrophoresis to determine the size of the PCR products. This PCR assay is capable of detecting a mutant cell population with a sensitivity of 5 mutant cells per 100 normal cells. A negative result does not exclude the presence of a myeloproliferative disorder or other neoplastic process. This test was developed and its performance characteristics determined by [facility]. It has not been cleared or approved by the Food and Drug Administration. The FDA has determined that such clearance or approval is not necessary.   . References: 08/09/2017 Comment   Final   Comment: (NOTE) 1. Klampfel, T. et al. (2013) Somatic mutations of calreticulin in   myeloproliferative neoplasms. [place] Engl. J. Med. 299:3716-9678. 2. Haynes Kerns et al. (2013) Somatic CALR mutations in   myeloproliferative neoplasms with nonmutated JAK2. [place] Engl. J.   Med.  [phone].   Marland Kitchen Director Review 08/09/2017 Comment   Final   Comment: (NOTE) Constance Goltz, PhD, [facility]               Director, [place] for [place] and [place], [place]   . JAK2 Exons 12-15 Mut Det PCR: 08/09/2017 Comment   Final   Comment: (NOTE) NEGATIVE JAK2 mutations were not detected in exons 12, 13, 14 and 15. This result does not rule out the presence of JAK2 mutation at a level below the detection sensitivity of this assay, the presence of other mutations outside the analyzed region of the JAK2 gene, or the presence of a myeloproliferative or other neoplasm. Result must be correlated with other clinical data for the most accurate diagnosis.   . Indications 08/09/2017 Comment:   Final   NO INDICATION SPECIFIED  . Specimen Type 08/09/2017 Comment   Final   No specimen type provided.  Marland Kitchen BACKGROUND: 08/09/2017 Comment   Final   Comment: (NOTE) JAK2 V617F mutation is detected in patients with polycythemia vera (95%), essential thrombocythemia (50%) and primary myelofibrosis (50%). A small percentage of JAK2 mutation positive patients (3.3%) contain other non-V617F mutations within exons 12 to 15. The detection of a JAK2 gene mutation aids in the specific diagnosis of a myeloproliferative neoplasm, and help distinguish this clonal disease from a benign reactive process.   . Method 08/09/2017 Comment   Final   Comment: (NOTE) Total RNA was purified from the provided specimen. The JAK2 gene region covering exons 12 to 15 was subjected to reverse- transcription coupled PCR amplification, and bi-directional sequencing to identify sequence variations. This assay has a sensitivity to detect approximately 15% population of cells containing the JAK2 mutations in a background of non-mutant cells. This test was developed and its performance  characteristics determined by [facility]. It has not been cleared or approved by the Food and Drug Administration.   . References 08/09/2017 Comment   Final   Comment: (NOTE) Algasham, N. et al. Detection of mutations in JAK2 exons 12-15 by Sanger sequencing. Int J Lab Hemato. 2015, 38:34-41. Joelene Millin al. Mutation profile of JAK2 transcripts in patients with chronic myeloproliferative neoplasias. J Mol Diagn. 2009,  11:49-53.   Marland Kitchen DIRECTOR REVIEW: 08/09/2017 Comment   Final   Comment: (NOTE) Loni Muse, PhD  Director, [place] for [facility] Biology and Pathology  Research [place], [place] [address]  [phone]   . MPL MUTATION ANALYSIS RESULT: 08/09/2017 Comment   Final   Comment: (NOTE) No MPL mutation was identified in the provided specimen of this individual. Results should be interpreted in conjunction with clinical and other laboratory findings for the most accurate interpretation.   Marland Kitchen BACKGROUND: 08/09/2017 Comment   Final   Comment: (NOTE) MPL (myeloproliferative leukemia virus oncogene homology) belongs to the hematopoietin superfamily and enables its ligand thrombopoietin to facilitate both global hematopoiesis and megakaryocyte growth and differentiation. MPL W515 mutations are present in patients with primary myelofibrosis (PMF) and essential thrombocythemia (ET) at a frequency of approximately 5% and 1% respectively. The S505 mutation is detected in patients with hereditary thrombocythemia.   Marland Kitchen METHODOLOGY: 08/09/2017 Comment   Final   Comment: (NOTE) Genomic DNA was purified from the provided specimen. MPL gene region covering the S505N and W515L/K mutations were subjected to PCR amplification and bi-directional sequencing in duplicate to identify sequence variations. This assay has a sensitivity to detect approximately 20-25% population of cells containing the MPL mutations in a background of non-mutant cells. This assay will not detect  the mutation below the sensitivity of this assay. Molecular- based testing is highly accurate, but as in any laboratory test, rare diagnostic errors may occur.   Marland Kitchen REFERENCES: 08/09/2017 Comment   Final   Comment: (NOTE) 1. Pardanani AD, et al. (2006). MPL515 mutations in   myeloproliferative and other myeloid disorders: a study   of 1182 patients. Blood 606:3016-0109. 2. Andre Lefort and Levine RL. (2008). JAK2 and MPL   mutations in myeloproliferative neoplasms: discovery and   science. Leukemia 22:1813-1817. 3. Juline Patch, et al. (2009). Evidence for a founder effect   of the MPL-S505N mutation in eight [place] pedigrees with   hereditary thrombocythemia. Haematologica 94(10):1368-   3235.   Marland Kitchen DIRECTOR REVIEW: 08/09/2017 Comment   Final   Comment: (NOTE) Loni Muse, PhD  Director, [place] for Molecular Biology and [place], [place] [address]  [phone] This test was developed and its performance characteristics determined by [facility]. It has not been cleared or approved by the Food and Drug Administration.   . Extraction 08/09/2017 Comment   Final   Comment: (NOTE) This sample has been received and DNA extraction has been performed. Performed At: [facility] [address] [place], [place] [id number] Nechama Guard MD HY:[id number] Performed At: [facility] [place], [place] [id number] Nechama Guard MD JK:[id number]     RADIOGRAPHIC STUDIES: I have personally reviewed the radiological images as listed and agree with the findings in the report  US Abdomen Complete  Result Date: 08/21/2017 CLINICAL DATA:  Polycythemia vera. History of fatty liver disease, hepatic cysts, kidney stones, prostatic malignancy. EXAM: ABDOMEN ULTRASOUND COMPLETE COMPARISON:  MRI of the abdomen of June 20, 2016 and abdominal ultrasound of Feb 16, 2016. FINDINGS: Gallbladder: The gallbladder is adequately  distended. There is an echogenic shadowing focus measuring just under 7 mm in diameter compatible with a stone. There is no gallbladder wall thickening, pericholecystic fluid, or positive sonographic Murphy's sign. Common bile duct: Diameter: 3.2 mm Liver: Penetration of the organ was limited by the increased echotexture diffusely. The surface contour of the liver appears smooth. No intrahepatic ductal dilation  or discrete mass is observed. The previously described hepatic cysts are not clearly new evident on this study. Portal vein is patent on color Doppler imaging with normal direction of blood flow towards the liver. IVC: No abnormality visualized. Pancreas: Visualized portion unremarkable. Spleen: The spleen is normal in echotexture and size measuring 6.3 cm in length. Right Kidney: Length: 17.6 cm. The renal cortical echotexture is slightly lower than that of the liver. Multiple cysts are present. The largest measures 9.6 x 6.5 by 9.1 cm and lies in the lower pole. There is a similar size cyst in the upper pole measuring 7.4 x 7.8 x 7.8 cm. Multiple smaller cysts are visible. There is no hydronephrosis. Left Kidney: Length: 16.8 cm. The renal cortical echotexture is similar to that on the right. Multiple cysts are observed. The largest lies in the upper pole and measures 8.0 x 7.6 x 0.4 cm. A second cyst in the lower pole measures 7.1 x 4.7 x 6.5 cm. Multiple smaller cysts are present. There is no hydronephrosis. Abdominal aorta: There is no aneurysm.  There is mural plaque. Other findings: None. IMPRESSION: Gallstones without sonographic evidence of acute cholecystitis. Increased hepatic echotexture compatible with fatty infiltrative change. Limited visualization of the hepatic parenchyma. The previously described hepatic cysts are not evident on today's study. Multiple large bilateral renal cysts.  No hydronephrosis. Atherosclerotic mural plaque in the aorta without evidence of aneurysm. Electronically  Signed   By: David  Martinique M.D.   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  I do not see any need for phlebotomy at present time patient is giving blood donation off and on and will continue to do so.  Sound has been reviewed shows gallstones asymptomatic And will continue to follow-up with primary care physician with periodic checkup of CBC.  At present time no intervention is needed  2.  Vitamin B12 deficiency.  Patient is getting regular injection of vitamin B12.  Patient had previous multiple abdominal surgery because of Crohn's disease Return evaluation in [place] Nezzie Manera, MD  08/23/2017 9:55 AM

## 2017-08-23 NOTE — Addendum Note (Signed)
Addended by: Donnie Aho on: 08/23/2017 10:31 AM   Modules accepted: Orders

## 2017-09-02 ENCOUNTER — Ambulatory Visit (INDEPENDENT_AMBULATORY_CARE_PROVIDER_SITE_OTHER): Payer: Medicare Other | Admitting: *Deleted

## 2017-09-02 DIAGNOSIS — E538 Deficiency of other specified B group vitamins: Secondary | ICD-10-CM | POA: Diagnosis not present

## 2017-09-02 MED ORDER — CYANOCOBALAMIN 1000 MCG/ML IJ SOLN
1000.0000 ug | INTRAMUSCULAR | Status: AC
  Administered 2017-09-02 – 2018-08-06 (×11): 1000 ug via INTRAMUSCULAR

## 2017-09-02 NOTE — Progress Notes (Signed)
Pt given Cyanocobalamin inj Tolerated well

## 2017-09-09 ENCOUNTER — Encounter: Payer: Self-pay | Admitting: Internal Medicine

## 2017-09-09 ENCOUNTER — Ambulatory Visit: Payer: Medicare Other | Admitting: Internal Medicine

## 2017-09-09 VITALS — BP 122/74 | HR 80 | Ht 67.25 in | Wt 247.0 lb

## 2017-09-09 DIAGNOSIS — D45 Polycythemia vera: Secondary | ICD-10-CM | POA: Diagnosis not present

## 2017-09-09 DIAGNOSIS — K760 Fatty (change of) liver, not elsewhere classified: Secondary | ICD-10-CM | POA: Diagnosis not present

## 2017-09-09 DIAGNOSIS — K660 Peritoneal adhesions (postprocedural) (postinfection): Secondary | ICD-10-CM | POA: Diagnosis not present

## 2017-09-09 DIAGNOSIS — Z8719 Personal history of other diseases of the digestive system: Secondary | ICD-10-CM

## 2017-09-09 MED ORDER — HYDROMORPHONE HCL 2 MG PO TABS: ORAL_TABLET | ORAL | 0 refills | Status: DC

## 2017-09-09 NOTE — Patient Instructions (Signed)
We have printed a prescription for Dilaudid for you to take to your pharmacy.   Continue your Levsin as needed when you have a episode of pain.  Follow up with Dr. Norman Herrlich in 9-12 months.

## 2017-09-09 NOTE — Progress Notes (Signed)
Subjective:    Patient ID: Luis Deleon, male    DOB: 09/29/1941, 76 y.o.   MRN: [id number]  HPI Luis Deleon is a 76 year old male with a history of ileal Crohn's status post ileocecectomy in 2003, history of ventral hernia status post repair with mesh, history of intermittent partial small bowel obstructions, history of bacterial overgrowth treated successfully with Augmentin, history of fatty liver who is here for follow-up.  He is here alone today and was last seen in March 2018.  He follows with Dr. Laurance Flatten for primary care.  Today he is feeling well and denies complaints.  He had an attack of his abdominal pain in July 2018 and went to the ER at [facility].  He actually left without being seen because while he was waiting to be seen his pain resolved.  He has had these attacks every 3-4 months.  He develops mid crampy escalating to severe abdominal pain associated with abdominal bloating and belching.  After 2-6 hours these episodes normally resolve and for 1-2 days he will have diarrhea.  He did try Levsin and Dilaudid orally 2 mg with his last attack but his pain escalated and thus he went to the ER.  In between these attacks he is feeling well.  He denies abdominal pain.  Regular bowel movements.  No blood in his stool or melena.  His bloating and abdominal gas along with fecal urgency has not returned after treatment for SIBO with Augmentin.  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  This showed a fatty liver without morphologic changes of cirrhosis or advanced fibrosis.  He has gallstones.  This study was reviewed today.   Review of Systems As per HPI, otherwise negative  Current Medications, Allergies, Past Medical History, Past Surgical History, Family History and Social History were reviewed in [employer] record.     Objective:   Physical Exam BP 122/74 (BP Location:  Left Arm, Patient Position: Sitting, Cuff Size: Normal)   Pulse 80   Ht 5' 7.25" (1.708 m) Comment: height measured without shoes  Wt 247 lb (112 kg)   BMI 38.40 kg/m  Constitutional: Well-developed and well-nourished. No distress. HEENT: Normocephalic and atraumatic.  No scleral icterus. Neck: Neck supple. Trachea midline. Cardiovascular: Normal rate, regular rhythm and intact distal pulses. No M/R/G Pulmonary/chest: Effort normal and breath sounds normal. No wheezing, rales or rhonchi. Abdominal: Soft, nontender, nondistended. Bowel sounds active throughout.  Extremities: no clubbing, cyanosis, or edema Neurological: Alert and oriented to person place and time. Skin: Skin is warm and dry. Psychiatric: Normal mood and affect. Behavior is normal.   CLINICAL DATA:  Polycythemia vera. History of fatty liver disease, hepatic cysts, kidney stones, prostatic malignancy.   EXAM: ABDOMEN ULTRASOUND COMPLETE   COMPARISON:  MRI of the abdomen of June 20, 2016 and abdominal ultrasound of Feb 16, 2016.   FINDINGS: Gallbladder: The gallbladder is adequately distended. There is an echogenic shadowing focus measuring just under 7 mm in diameter compatible with a stone. There is no gallbladder wall thickening, pericholecystic fluid, or positive sonographic Murphy's sign.   Common bile duct: Diameter: 3.2 mm   Liver: Penetration of the organ was limited by the increased echotexture diffusely. The surface contour of the liver appears smooth. No intrahepatic ductal dilation or discrete mass is observed. The previously described hepatic cysts are not clearly new evident on this study. Portal vein is patent  on color Doppler imaging with normal direction of blood flow towards the liver.   IVC: No abnormality visualized.   Pancreas: Visualized portion unremarkable.   Spleen: The spleen is normal in echotexture and size measuring 6.3 cm in length.   Right Kidney: Length: 17.6 cm. The  renal cortical echotexture is slightly lower than that of the liver. Multiple cysts are present. The largest measures 9.6 x 6.5 by 9.1 cm and lies in the lower pole. There is a similar size cyst in the upper pole measuring 7.4 x 7.8 x 7.8 cm. Multiple smaller cysts are visible. There is no hydronephrosis.   Left Kidney: Length: 16.8 cm. The renal cortical echotexture is similar to that on the right. Multiple cysts are observed. The largest lies in the upper pole and measures 8.0 x 7.6 x 0.4 cm. A second cyst in the lower pole measures 7.1 x 4.7 x 6.5 cm. Multiple smaller cysts are present. There is no hydronephrosis.   Abdominal aorta: There is no aneurysm.  There is mural plaque.   Other findings: None.   IMPRESSION: Gallstones without sonographic evidence of acute cholecystitis.   Increased hepatic echotexture compatible with fatty infiltrative change. Limited visualization of the hepatic parenchyma. The previously described hepatic cysts are not evident on today's study.   Multiple large bilateral renal cysts.  No hydronephrosis.   Atherosclerotic mural plaque in the aorta without evidence of aneurysm.     Electronically Signed   By: David  Martinique M.D.   On: 08/21/2017 16:36   CMP     Component Value Date/Time   NA 143 07/03/2017 1219   K 3.6 07/03/2017 1219   CL 103 07/03/2017 1219   CO2 23 07/03/2017 1219   GLUCOSE 87 07/03/2017 1219   GLUCOSE 89 09/02/2013 0818   BUN 15 07/03/2017 1219   CREATININE 1.24 07/03/2017 1219   CREATININE 1.56 (H) 09/02/2013 0818   CALCIUM 9.5 07/03/2017 1219   PROT 6.5 07/03/2017 1219   ALBUMIN 3.7 07/03/2017 1219   AST 39 07/03/2017 1219   ALT 34 07/03/2017 1219   ALKPHOS 58 07/03/2017 1219   BILITOT 1.7 (H) 07/03/2017 1219   GFRNONAA 57 (L) 07/03/2017 1219   GFRNONAA 45 (L) 09/02/2013 0818   GFRAA 66 07/03/2017 1219   GFRAA 52 (L) 09/02/2013 0818   CBC    Component Value Date/Time   WBC 9.6 08/09/2017 1354   RBC 5.46  08/09/2017 1354   HGB 17.8 (H) 08/09/2017 1354   HGB 18.0 (H) 07/03/2017 1219   HGB 16.8 01/07/2013 1057   HCT 51.1 08/09/2017 1354   HCT 51.4 (H) 07/03/2017 1219   HCT 48.1 01/07/2013 1057   PLT 152 08/09/2017 1354   PLT 154 07/03/2017 1219   MCV 93.6 08/09/2017 1354   MCV 92 07/03/2017 1219   MCV 90.9 01/07/2013 1057   MCH 32.6 08/09/2017 1354   MCHC 34.8 08/09/2017 1354   RDW 14.5 08/09/2017 1354   RDW 15.0 07/03/2017 1219   RDW 14.7 (H) 01/07/2013 1057   LYMPHSABS 1.6 07/03/2017 1219   LYMPHSABS 1.4 01/07/2013 1057   MONOABS 0.8 01/07/2013 1057   EOSABS 0.2 07/03/2017 1219   BASOSABS 0.0 07/03/2017 1219   BASOSABS 0.0 01/07/2013 1057       Assessment & Plan:  76 year old male with a history of ileal Crohn's status post ileocecectomy in 2003, history of ventral hernia status post repair with mesh, history of intermittent partial small bowel obstructions, history of bacterial overgrowth treated successfully with  Augmentin, history of fatty liver who is here for follow-up.   1.  Intermittent, partial small bowel obstructions --felt to explain his intermittent attacks of abdominal pain and bloating as well as post obstructive diarrhea.  Low residue diet has not prevented these attacks.  I discussed surgical referral for consideration of lysis of adhesions but he would prefer to avoid surgery if possible.  This is understandable.  He will let me know if attacks are occurring more frequently and we may revisit the surgery consult. He asked about increasing pain medication during these attacks and this is certainly reasonable.  He can still use Levsin 0.25 mg when these attacks start and after 5-10 minutes if not improving use Dilaudid 2 mg p.o., and he can re-dose after 15 minutes if not improving (up to 4 mg).  He can repeat a dose in 3 hours if not improving. If not improving he may need to go to the ER, which he understands  2.  Ileal Crohn's disease --clinical and radiographic  remission after ileocecectomy.  No evidence for active or ongoing Crohn's disease.  3.  SIBO --improved with treatment with Augmentin.  No recurrence to this point.  Retreat if needed  4.  Polycythemia vera --following with hematology.  They have recommended that he be a regular blood donor.  5.  CRC screening --no history of adenomatous polyps.  Surveillance colonoscopy interval will be every 10 years  6. Fatty liver --history of fatty liver.  No evidence for cirrhosis.  AST and ALT were checked recently normal.  Bili is slightly elevated but has been borderline to slightly elevated over time.  This can be followed.  Discuss fatty liver which is treated by controlling risk factors including hypertension, hyperlipidemia and watching blood sugars along with diet and exercise.  9-89-monthfollow-up, sooner if necessary 25 minutes spent with the patient today. Greater than 50% was spent in counseling and coordination of care with the patient

## 2017-09-10 ENCOUNTER — Other Ambulatory Visit: Payer: Self-pay | Admitting: Family Medicine

## 2017-09-14 ENCOUNTER — Other Ambulatory Visit: Payer: Self-pay | Admitting: Family Medicine

## 2017-09-17 ENCOUNTER — Other Ambulatory Visit: Payer: Self-pay | Admitting: *Deleted

## 2017-09-17 MED ORDER — GEMFIBROZIL 600 MG PO TABS: 600.0000 mg | ORAL_TABLET | Freq: Every day | ORAL | 0 refills | Status: DC

## 2017-09-30 ENCOUNTER — Other Ambulatory Visit: Payer: Self-pay | Admitting: Family Medicine

## 2017-10-03 ENCOUNTER — Ambulatory Visit: Payer: Medicare Other

## 2017-10-06 ENCOUNTER — Other Ambulatory Visit: Payer: Self-pay | Admitting: Family Medicine

## 2017-10-24 ENCOUNTER — Ambulatory Visit (INDEPENDENT_AMBULATORY_CARE_PROVIDER_SITE_OTHER): Payer: Medicare HMO | Admitting: Family Medicine

## 2017-10-24 ENCOUNTER — Ambulatory Visit (INDEPENDENT_AMBULATORY_CARE_PROVIDER_SITE_OTHER): Payer: Medicare HMO

## 2017-10-24 ENCOUNTER — Encounter: Payer: Self-pay | Admitting: Family Medicine

## 2017-10-24 VITALS — BP 120/67 | HR 80 | Temp 97.2°F | Ht 67.25 in | Wt 246.0 lb

## 2017-10-24 DIAGNOSIS — I10 Essential (primary) hypertension: Secondary | ICD-10-CM

## 2017-10-24 DIAGNOSIS — L570 Actinic keratosis: Secondary | ICD-10-CM | POA: Diagnosis not present

## 2017-10-24 DIAGNOSIS — C61 Malignant neoplasm of prostate: Secondary | ICD-10-CM | POA: Diagnosis not present

## 2017-10-24 DIAGNOSIS — D696 Thrombocytopenia, unspecified: Secondary | ICD-10-CM

## 2017-10-24 DIAGNOSIS — E8881 Metabolic syndrome: Secondary | ICD-10-CM

## 2017-10-24 DIAGNOSIS — E559 Vitamin D deficiency, unspecified: Secondary | ICD-10-CM | POA: Diagnosis not present

## 2017-10-24 DIAGNOSIS — Z0000 Encounter for general adult medical examination without abnormal findings: Secondary | ICD-10-CM

## 2017-10-24 DIAGNOSIS — L720 Epidermal cyst: Secondary | ICD-10-CM | POA: Diagnosis not present

## 2017-10-24 DIAGNOSIS — D45 Polycythemia vera: Secondary | ICD-10-CM | POA: Diagnosis not present

## 2017-10-24 DIAGNOSIS — I700 Atherosclerosis of aorta: Secondary | ICD-10-CM | POA: Diagnosis not present

## 2017-10-24 DIAGNOSIS — E7800 Pure hypercholesterolemia, unspecified: Secondary | ICD-10-CM | POA: Diagnosis not present

## 2017-10-24 DIAGNOSIS — Z1283 Encounter for screening for malignant neoplasm of skin: Secondary | ICD-10-CM | POA: Diagnosis not present

## 2017-10-24 DIAGNOSIS — K5090 Crohn's disease, unspecified, without complications: Secondary | ICD-10-CM | POA: Diagnosis not present

## 2017-10-24 DIAGNOSIS — E538 Deficiency of other specified B group vitamins: Secondary | ICD-10-CM | POA: Diagnosis not present

## 2017-10-24 DIAGNOSIS — X32XXXD Exposure to sunlight, subsequent encounter: Secondary | ICD-10-CM | POA: Diagnosis not present

## 2017-10-24 DIAGNOSIS — D225 Melanocytic nevi of trunk: Secondary | ICD-10-CM | POA: Diagnosis not present

## 2017-10-24 LAB — URINALYSIS, COMPLETE
BILIRUBIN UA: NEGATIVE
Glucose, UA: NEGATIVE
Ketones, UA: NEGATIVE
Leukocytes, UA: NEGATIVE
Nitrite, UA: NEGATIVE
PH UA: 5.5 (ref 5.0–7.5)
Protein, UA: NEGATIVE
Specific Gravity, UA: 1.015 (ref 1.005–1.030)
UUROB: 0.2 mg/dL (ref 0.2–1.0)

## 2017-10-24 LAB — MICROSCOPIC EXAMINATION
BACTERIA UA: NONE SEEN
EPITHELIAL CELLS (NON RENAL): NONE SEEN /HPF (ref 0–10)
RBC, UA: NONE SEEN /hpf (ref 0–?)
RENAL EPITHEL UA: NONE SEEN /HPF
WBC, UA: NONE SEEN /hpf (ref 0–?)

## 2017-10-24 NOTE — Patient Instructions (Addendum)
Medicare Annual Wellness Visit  [facility] and the medical providers at [facility] strive to bring you the best medical care.  In doing so we not only want to address your current medical conditions and concerns but also to detect new conditions early and prevent illness, disease and health-related problems.    Medicare offers a yearly Wellness Visit which allows our clinical staff to assess your need for preventative services including immunizations, lifestyle education, counseling to decrease risk of preventable diseases and screening for fall risk and other medical concerns.    This visit is provided free of charge (no copay) for all Medicare recipients. The clinical pharmacists at [facility] have begun to conduct these Wellness Visits which will also include a thorough review of all your medications.    As you primary medical provider recommend that you make an appointment for your Annual Wellness Visit if you have not done so already this year.  You may set up this appointment before you leave today or you may call back ([phone]) and schedule an appointment.  Please make sure when you call that you mention that you are scheduling your Annual Wellness Visit with the clinical pharmacist so that the appointment may be made for the proper length of time.     Continue current medications. Continue good therapeutic lifestyle changes which include good diet and exercise. Fall precautions discussed with patient. If an FOBT was given today- please return it to our front desk. If you are over 24 years old - you may need Prevnar 52 or the adult Pneumonia vaccine.  **Flu shots are available--- please call and schedule a FLU-CLINIC appointment**  After your visit with [place] today you will receive a survey in the mail or online from [employer] regarding your care with [place]. Please take a moment to fill this out. Your feedback is very  important to [place] as you can help [place] better understand your patient needs as well as improve your experience and satisfaction. WE CARE ABOUT YOU!!!   Continue to follow-up with gastroenterology Always drink plenty of fluids especially water and stay well-hydrated Follow-up with hematology as planned for polycythemia vera. We will call with lab work results and chest x-ray results as soon as those results become available.

## 2017-10-24 NOTE — Progress Notes (Signed)
Subjective:    Patient ID: Luis Deleon, male    DOB: May 26, 1941, 77 y.o.   MRN: [id number]  HPI Patient is here today for follow up of chronic medical problems which includes hypertension and hyperlipidemia. He is taking medication regularly.  The patient is pleasant and doing well overall.  He is followed regularly by the gastroenterologist because of his Crohn's disease.  He has a history of thrombocytopenia atherosclerosis of the aorta and high blood pressure.  He also has had prostate cancer.  He also has polycythemia vera.  He is seen the hematologist about this and was told to go have more phlebotomies done.  He sees the gastroenterologist regularly.  The patient today denies any chest pain pressure or tightness.  He denies any shortness of breath.  His stools have been more formed and less loose over the past several months.  He denies any nausea vomiting diarrhea his water without problems.    Patient Active Problem List   Diagnosis Date Noted  . Thrombocytopenia ([place]) 04/05/2015  . Abdominal aortic atherosclerosis ([place]) 11/25/2014  . Metabolic syndrome 33/35/4562  . DOE (dyspnea on exertion) 08/11/2013  . 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 POLYCYTHEMIA 11/03/2008  . LIVER FUNCTION TESTS, ABNORMAL, HX OF 09/24/2008  . Hyperlipidemia 07/28/2008  . GOUT, UNSPECIFIED 07/28/2008  . HTN (hypertension) 07/28/2008  . 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 DIVERTICULOSIS, COLON 07/28/2008  . Prostate cancer ([place]) 07/28/2008  . SMALL BOWEL OBSTRUCTION, HX OF 07/28/2008  . NEPHROLITHIASIS, HX OF 07/28/2008   Outpatient Encounter Medications as of 10/24/2017  Medication Sig  . allopurinol (ZYLOPRIM) 100 MG tablet TAKE 2 TABLETS DAILY AS DIRECTED  . aspirin 81 MG EC tablet Take 81 mg by mouth daily.    Marland Kitchen atorvastatin (LIPITOR) 40 MG tablet TAKE 1 TABLET EVERY DAY AS DIRECTED  . Cholecalciferol (VITAMIN D3) 1000 UNITS CAPS Take 3 capsules by mouth daily.   . fluticasone  (FLONASE) 50 MCG/ACT nasal spray Place 2 sprays into both nostrils daily.  Marland Kitchen gemfibrozil (LOPID) 600 MG tablet Take 1 tablet (600 mg total) by mouth daily.  . hydrochlorothiazide (MICROZIDE) 12.5 MG capsule TAKE 1 TABLET (12.5 MG TOTAL) BY MOUTH DAILY. AS DIRECTED  . HYDROmorphone (DILAUDID) 2 MG tablet Take 1-2 capsules by mouth every 3 hours as needed  . hyoscyamine (LEVSIN SL) 0.125 MG SL tablet Place 2 tablets (0.25 mg total) under the tongue every 6 (six) hours as needed.  Marland Kitchen KLOR-CON M20 20 MEQ tablet TAKE 1 TABLET TWICE DAILY ON MON, WED AND FRIDAY-TAKE 1 TABLET DAILY EVERY OTHER DAY  . losartan (COZAAR) 100 MG tablet TAKE 1 TABLET BY MOUTH EVERY DAY AS DIRECTED  . nystatin-triamcinolone (MYCOLOG II) cream Apply 1 application topically 2 (two) times daily. (Patient taking differently: Apply 1 application as needed topically. )  . OMEGA-3 1000 MG CAPS Take 2 capsules by mouth 2 (two) times daily.   Facility-Administered Encounter Medications as of 10/24/2017  Medication  . cyanocobalamin ((VITAMIN B-12)) injection 1,000 mcg      Review of Systems  Constitutional: Negative.   HENT: Negative.   Eyes: Negative.   Respiratory: Negative.   Cardiovascular: Negative.   Gastrointestinal: Negative.   Endocrine: Negative.   Genitourinary: Negative.   Musculoskeletal: Negative.   Skin: Negative.   Allergic/Immunologic: Negative.   Neurological: Negative.   Hematological: Negative.   Psychiatric/Behavioral: Negative.        Objective:   Physical Exam  Constitutional: He is oriented to person, place,  and time. He appears well-developed and well-nourished. No distress.  Patient is pleasant and alert and in good spirits.  He has a upcoming dermatology visit later today.  HENT:  Head: Normocephalic and atraumatic.  Right Ear: External ear normal.  Left Ear: External ear normal.  Mouth/Throat: Oropharynx is clear and moist. No oropharyngeal exudate.  Nasal turbinate congestion and  swelling bilaterally  Eyes: Conjunctivae and EOM are normal. Pupils are equal, round, and reactive to light. Right eye exhibits no discharge. Left eye exhibits no discharge. No scleral icterus.  Neck: Normal range of motion. Neck supple. No thyromegaly present.  No bruits thyromegaly or anterior cervical adenopathy  Cardiovascular: Normal rate, regular rhythm, normal heart sounds and intact distal pulses.  No murmur heard. Heart is regular at 72/min  Pulmonary/Chest: Effort normal and breath sounds normal. No respiratory distress. He has no wheezes. He has no rales. He exhibits no tenderness.  No axillary adenopathy chest wall masses and lungs were clear anteriorly and posteriorly.  Abdominal: Soft. Bowel sounds are normal. He exhibits no mass. There is no tenderness. There is no rebound and no guarding.  Abdominal obesity without masses tenderness or organ enlargement or bruits  Genitourinary: Rectum normal and penis normal.  Genitourinary Comments: The prostate vault remains empty with no lumps or masses and there were no rectal masses.  There were no hernias palpable on the left or right and the external genitalia were normal.  Musculoskeletal: Normal range of motion. He exhibits no edema.  Lower extremity varicosities bilaterally  Lymphadenopathy:    He has no cervical adenopathy.  Neurological: He is alert and oriented to person, place, and time. He has normal reflexes. No cranial nerve deficit.  Skin: Skin is warm and dry. No rash noted.  Multiple seborrheic dermatoses.  Cyst on left cheek.  Crater-like lesion medial to the cyst.  Patient will discuss this with dermatology today.  Psychiatric: He has a normal mood and affect. His behavior is normal. Judgment and thought content normal.  Nursing note and vitals reviewed.  BP 120/67 (BP Location: Left Arm)   Pulse 80   Temp (!) 97.2 F (36.2 C) (Oral)   Ht 5' 7.25" (1.708 m)   Wt 246 lb (111.6 kg)   BMI 38.24 kg/m    EKG with  results pending===     Assessment & Plan:  1. Pure hypercholesterolemia -Continue current treatment pending results of lab work - Lipid panel - CBC with Differential/Platelet - DG Chest 2 View; Future - EKG 12-Lead  2. Vitamin D deficiency -Continue vitamin D replacement pending results of blood work - VITAMIN D 25 Hydroxy (Vit-D Deficiency, Fractures) - CBC with Differential/Platelet  3. Essential hypertension -Blood pressure is good today and he will continue with current treatment - BMP8+EGFR - Hepatic function panel - CBC with Differential/Platelet - DG Chest 2 View; Future - EKG 12-Lead  4. Thrombocytopenia (H[facility]) - CBC with Differential/Platelet  5. Metabolic syndrome -Continue to work on weight with diet and exercise - CBC with Differential/Platelet  6. Prostate cancer ([place]) -No symptoms and prostate vault is empty - CBC with Differential/Platelet - PSA, total and free - Urinalysis, Complete  7. Vitamin B 12 deficiency - Vitamin B12  8. Abdominal aortic atherosclerosis ([place]) -Continue aggressive therapeutic lifestyle changes omega-3 fatty acids and atorvastatin  9. Crohn's disease without complication, unspecified gastrointestinal tract location [facility]) -Continue follow-up with gastroenterology  10. B12 deficiency -Check B12 level  11. Polycythemia vera ([place]) -Follow-up with hematology as needed  Patient Instructions                       Medicare Annual Wellness Visit  [facility] and the medical providers at [facility] strive to bring you the best medical care.  In doing so we not only want to address your current medical conditions and concerns but also to detect new conditions early and prevent illness, disease and health-related problems.    Medicare offers a yearly Wellness Visit which allows our clinical staff to assess your need for preventative services including immunizations, lifestyle education, counseling to decrease  risk of preventable diseases and screening for fall risk and other medical concerns.    This visit is provided free of charge (no copay) for all Medicare recipients. The clinical pharmacists at [facility] have begun to conduct these Wellness Visits which will also include a thorough review of all your medications.    As you primary medical provider recommend that you make an appointment for your Annual Wellness Visit if you have not done so already this year.  You may set up this appointment before you leave today or you may call back ([phone]) and schedule an appointment.  Please make sure when you call that you mention that you are scheduling your Annual Wellness Visit with the clinical pharmacist so that the appointment may be made for the proper length of time.     Continue current medications. Continue good therapeutic lifestyle changes which include good diet and exercise. Fall precautions discussed with patient. If an FOBT was given today- please return it to our front desk. If you are over 27 years old - you may need Prevnar 13 or the adult Pneumonia vaccine.  **Flu shots are available--- please call and schedule a FLU-CLINIC appointment**  After your visit with [place] today you will receive a survey in the mail or online from [employer] regarding your care with [place]. Please take a moment to fill this out. Your feedback is very important to [place] as you can help [place] better understand your patient needs as well as improve your experience and satisfaction. WE CARE ABOUT YOU!!!   Continue to follow-up with gastroenterology Always drink plenty of fluids especially water and stay well-hydrated Follow-up with hematology as planned for polycythemia vera. We will call with lab work results and chest x-ray results as soon as those results become available.  Arrie Senate MD

## 2017-10-25 LAB — CBC WITH DIFFERENTIAL/PLATELET
BASOS: 0 %
Basophils Absolute: 0 10*3/uL (ref 0.0–0.2)
EOS (ABSOLUTE): 0.1 10*3/uL (ref 0.0–0.4)
EOS: 1 %
HEMATOCRIT: 51.3 % — AB (ref 37.5–51.0)
Hemoglobin: 17.8 g/dL — ABNORMAL HIGH (ref 13.0–17.7)
IMMATURE GRANULOCYTES: 0 %
Immature Grans (Abs): 0 10*3/uL (ref 0.0–0.1)
LYMPHS ABS: 1.8 10*3/uL (ref 0.7–3.1)
Lymphs: 20 %
MCH: 32.1 pg (ref 26.6–33.0)
MCHC: 34.7 g/dL (ref 31.5–35.7)
MCV: 93 fL (ref 79–97)
Monocytes Absolute: 1 10*3/uL — ABNORMAL HIGH (ref 0.1–0.9)
Monocytes: 11 %
NEUTROS PCT: 68 %
Neutrophils Absolute: 6 10*3/uL (ref 1.4–7.0)
Platelets: 152 10*3/uL (ref 150–379)
RBC: 5.54 x10E6/uL (ref 4.14–5.80)
RDW: 15 % (ref 12.3–15.4)
WBC: 8.9 10*3/uL (ref 3.4–10.8)

## 2017-10-25 LAB — LIPID PANEL
CHOLESTEROL TOTAL: 133 mg/dL (ref 100–199)
Chol/HDL Ratio: 3.6 ratio (ref 0.0–5.0)
HDL: 37 mg/dL — ABNORMAL LOW (ref >39)
LDL Calculated: 64 mg/dL (ref 0–99)
TRIGLYCERIDES: 160 mg/dL — AB (ref 0–149)
VLDL Cholesterol Cal: 32 mg/dL (ref 5–40)

## 2017-10-25 LAB — VITAMIN D 25 HYDROXY (VIT D DEFICIENCY, FRACTURES): Vit D, 25-Hydroxy: 38.9 ng/mL (ref 30.0–100.0)

## 2017-10-25 LAB — BMP8+EGFR
BUN/Creatinine Ratio: 12 (ref 10–24)
BUN: 14 mg/dL (ref 8–27)
CHLORIDE: 103 mmol/L (ref 96–106)
CO2: 26 mmol/L (ref 20–29)
Calcium: 9 mg/dL (ref 8.6–10.2)
Creatinine, Ser: 1.14 mg/dL (ref 0.76–1.27)
GFR calc Af Amer: 73 mL/min/{1.73_m2} (ref >59)
GFR calc non Af Amer: 63 mL/min/{1.73_m2} (ref >59)
GLUCOSE: 88 mg/dL (ref 65–99)
POTASSIUM: 3.9 mmol/L (ref 3.5–5.2)
Sodium: 143 mmol/L (ref 134–144)

## 2017-10-25 LAB — HEPATIC FUNCTION PANEL
ALBUMIN: 3.9 g/dL (ref 3.5–4.8)
ALK PHOS: 66 IU/L (ref 39–117)
ALT: 34 IU/L (ref 0–44)
AST: 38 IU/L (ref 0–40)
BILIRUBIN TOTAL: 1.9 mg/dL — AB (ref 0.0–1.2)
BILIRUBIN, DIRECT: 0.37 mg/dL (ref 0.00–0.40)
TOTAL PROTEIN: 6.8 g/dL (ref 6.0–8.5)

## 2017-10-25 LAB — PSA, TOTAL AND FREE
PSA, Free: <0.01 ng/mL
Prostate Specific Ag, Serum: <0.1 ng/mL (ref 0.0–4.0)

## 2017-10-25 LAB — VITAMIN B12: Vitamin B-12: 330 pg/mL (ref 232–1245)

## 2017-11-02 ENCOUNTER — Other Ambulatory Visit: Payer: Self-pay | Admitting: Family Medicine

## 2017-11-05 ENCOUNTER — Ambulatory Visit (INDEPENDENT_AMBULATORY_CARE_PROVIDER_SITE_OTHER): Payer: Medicare HMO | Admitting: *Deleted

## 2017-11-05 DIAGNOSIS — E538 Deficiency of other specified B group vitamins: Secondary | ICD-10-CM | POA: Diagnosis not present

## 2017-11-05 NOTE — Progress Notes (Signed)
Pt given Cyanocobalamin inj Tolerated well

## 2017-11-06 ENCOUNTER — Encounter: Payer: Self-pay | Admitting: Pediatrics

## 2017-11-06 ENCOUNTER — Ambulatory Visit (INDEPENDENT_AMBULATORY_CARE_PROVIDER_SITE_OTHER): Payer: Medicare HMO | Admitting: Pediatrics

## 2017-11-06 VITALS — BP 127/83 | HR 96 | Temp 98.2°F | Resp 20 | Ht 67.5 in | Wt 244.6 lb

## 2017-11-06 DIAGNOSIS — J441 Chronic obstructive pulmonary disease with (acute) exacerbation: Secondary | ICD-10-CM | POA: Diagnosis not present

## 2017-11-06 MED ORDER — AZITHROMYCIN 250 MG PO TABS: ORAL_TABLET | ORAL | 0 refills | Status: DC

## 2017-11-06 MED ORDER — PREDNISONE 20 MG PO TABS: 40.0000 mg | ORAL_TABLET | Freq: Every day | ORAL | 0 refills | Status: AC

## 2017-11-06 MED ORDER — ALBUTEROL SULFATE HFA 108 (90 BASE) MCG/ACT IN AERS: 2.0000 | INHALATION_SPRAY | Freq: Four times a day (QID) | RESPIRATORY_TRACT | 0 refills | Status: DC | PRN

## 2017-11-06 NOTE — Progress Notes (Signed)
  Subjective:   Patient ID: Luis Deleon, male    DOB: [birth year], 77 y.o.   MRN: [id number] CC: Cough and Nasal Congestion  HPI: YOSHIAKI Deleon is a 77 y.o. male presenting for Cough and Nasal Congestion  Cough bothering him the most Started 3-4 days ago Wheezing some at night Lots of nasal congestion Doesn't think he's been having any fevers Cough sometimes productive Doesn't feel SOB    Relevant past medical, surgical, family and social history reviewed. Allergies and medications reviewed and updated. Social History   Tobacco Use  Smoking Status Former Smoker  . Packs/day: 1.00  . Years: 38.00  . Pack years: 38.00  . Types: Cigarettes  . Last attempt to quit: 03/19/1991  . Years since quitting: 26.6  Smokeless Tobacco Current User  . Types: Chew   ROS: Per HPI   Objective:    BP 127/83   Pulse 96   Temp 98.2 F (36.8 C) (Oral)   Resp 20   Ht 5' 7.5" (1.715 m)   Wt 244 lb 9.6 oz (110.9 kg)   SpO2 96%   BMI 37.74 kg/m   Wt Readings from Last 3 Encounters:  11/06/17 244 lb 9.6 oz (110.9 kg)  10/24/17 246 lb (111.6 kg)  09/09/17 247 lb (112 kg)   O2 sat 96%  Gen: NAD, alert, cooperative with exam, NCAT EYES: EOMI, no conjunctival injection, or no icterus ENT:  TMs pearly gray b/l, OP without erythema LYMPH: no cervical LAD CV: NRRR, normal S1/S2, no murmur, distal pulses 2+ b/l Resp: wheezing b/l with expiration, comfortable WOB, speakingin complete sentences Ext: No edema, warm Neuro: Alert and oriented, strength equal b/l UE and LE, coordination grossly normal MSK: normal muscle bulk  Assessment & Plan:  Torell was seen today for cough and nasal congestion.  Diagnoses and all orders for this visit:  COPD exacerbation ([place]) Wheezing today, will treat with below Return precautions discussed -     predniSONE (DELTASONE) 20 MG tablet; Take 2 tablets (40 mg total) by mouth daily with breakfast for 3 days. -     azithromycin (ZITHROMAX) 250 MG tablet;  Take 2 the first day and then one each day after. -     albuterol (PROVENTIL HFA;VENTOLIN HFA) 108 (90 Base) MCG/ACT inhaler; Inhale 2 puffs into the lungs every 6 (six) hours as needed for wheezing or shortness of breath.   Follow up plan: As needed Assunta Found, MD [place]

## 2017-12-05 ENCOUNTER — Other Ambulatory Visit: Payer: Self-pay | Admitting: Family Medicine

## 2017-12-09 ENCOUNTER — Other Ambulatory Visit: Payer: Self-pay | Admitting: Family Medicine

## 2017-12-09 ENCOUNTER — Ambulatory Visit (INDEPENDENT_AMBULATORY_CARE_PROVIDER_SITE_OTHER): Payer: Medicare HMO | Admitting: *Deleted

## 2017-12-09 DIAGNOSIS — E538 Deficiency of other specified B group vitamins: Secondary | ICD-10-CM | POA: Diagnosis not present

## 2017-12-09 NOTE — Progress Notes (Signed)
Pt given Cyanocobalamin inj Tolerated well

## 2017-12-19 ENCOUNTER — Emergency Department (HOSPITAL_COMMUNITY): Payer: Medicare HMO

## 2017-12-19 ENCOUNTER — Encounter (HOSPITAL_COMMUNITY): Payer: Self-pay

## 2017-12-19 ENCOUNTER — Telehealth: Payer: Self-pay | Admitting: *Deleted

## 2017-12-19 ENCOUNTER — Observation Stay (HOSPITAL_COMMUNITY)
Admission: EM | Admit: 2017-12-19 | Discharge: 2017-12-20 | Disposition: A | Discharge status: Home / Self Care | Payer: Medicare HMO | Attending: Internal Medicine | Admitting: Internal Medicine

## 2017-12-19 DIAGNOSIS — R000 Tachycardia, unspecified: Secondary | ICD-10-CM | POA: Diagnosis not present

## 2017-12-19 DIAGNOSIS — R0789 Other chest pain: Secondary | ICD-10-CM | POA: Diagnosis not present

## 2017-12-19 DIAGNOSIS — K5090 Crohn's disease, unspecified, without complications: Secondary | ICD-10-CM | POA: Diagnosis not present

## 2017-12-19 DIAGNOSIS — I10 Essential (primary) hypertension: Secondary | ICD-10-CM | POA: Diagnosis not present

## 2017-12-19 DIAGNOSIS — E7800 Pure hypercholesterolemia, unspecified: Secondary | ICD-10-CM

## 2017-12-19 DIAGNOSIS — R002 Palpitations: Secondary | ICD-10-CM

## 2017-12-19 DIAGNOSIS — E785 Hyperlipidemia, unspecified: Secondary | ICD-10-CM | POA: Diagnosis not present

## 2017-12-19 DIAGNOSIS — R079 Chest pain, unspecified: Secondary | ICD-10-CM

## 2017-12-19 DIAGNOSIS — C61 Malignant neoplasm of prostate: Secondary | ICD-10-CM

## 2017-12-19 DIAGNOSIS — Z7982 Long term (current) use of aspirin: Secondary | ICD-10-CM | POA: Diagnosis not present

## 2017-12-19 DIAGNOSIS — Z79899 Other long term (current) drug therapy: Secondary | ICD-10-CM | POA: Diagnosis not present

## 2017-12-19 DIAGNOSIS — Z8546 Personal history of malignant neoplasm of prostate: Secondary | ICD-10-CM | POA: Diagnosis not present

## 2017-12-19 DIAGNOSIS — R0602 Shortness of breath: Secondary | ICD-10-CM | POA: Diagnosis not present

## 2017-12-19 DIAGNOSIS — R072 Precordial pain: Principal | ICD-10-CM | POA: Insufficient documentation

## 2017-12-19 DIAGNOSIS — R69 Illness, unspecified: Secondary | ICD-10-CM | POA: Diagnosis not present

## 2017-12-19 DIAGNOSIS — F17290 Nicotine dependence, other tobacco product, uncomplicated: Secondary | ICD-10-CM | POA: Diagnosis not present

## 2017-12-19 DIAGNOSIS — E876 Hypokalemia: Secondary | ICD-10-CM | POA: Diagnosis not present

## 2017-12-19 DIAGNOSIS — D696 Thrombocytopenia, unspecified: Secondary | ICD-10-CM | POA: Diagnosis not present

## 2017-12-19 DIAGNOSIS — R7989 Other specified abnormal findings of blood chemistry: Secondary | ICD-10-CM | POA: Diagnosis not present

## 2017-12-19 LAB — BASIC METABOLIC PANEL
Anion gap: 10 (ref 5–15)
BUN: 14 mg/dL (ref 6–20)
CHLORIDE: 108 mmol/L (ref 101–111)
CO2: 23 mmol/L (ref 22–32)
CREATININE: 1.11 mg/dL (ref 0.61–1.24)
Calcium: 9.4 mg/dL (ref 8.9–10.3)
Glucose, Bld: 103 mg/dL — ABNORMAL HIGH (ref 65–99)
Potassium: 3.2 mmol/L — ABNORMAL LOW (ref 3.5–5.1)
SODIUM: 141 mmol/L (ref 135–145)

## 2017-12-19 LAB — CBC WITH DIFFERENTIAL/PLATELET
BASOS PCT: 0 %
Basophils Absolute: 0 10*3/uL (ref 0.0–0.1)
EOS ABS: 0.1 10*3/uL (ref 0.0–0.7)
Eosinophils Relative: 1 %
HEMATOCRIT: 49 % (ref 39.0–52.0)
HEMOGLOBIN: 16.6 g/dL (ref 13.0–17.0)
Lymphocytes Relative: 16 %
Lymphs Abs: 1.3 10*3/uL (ref 0.7–4.0)
MCH: 32.2 pg (ref 26.0–34.0)
MCHC: 33.9 g/dL (ref 30.0–36.0)
MCV: 95.1 fL (ref 78.0–100.0)
Monocytes Absolute: 0.8 10*3/uL (ref 0.1–1.0)
Monocytes Relative: 10 %
NEUTROS ABS: 5.9 10*3/uL (ref 1.7–7.7)
NEUTROS PCT: 73 %
Platelets: 147 10*3/uL — ABNORMAL LOW (ref 150–400)
RBC: 5.15 MIL/uL (ref 4.22–5.81)
RDW: 14.5 % (ref 11.5–15.5)
WBC: 8.1 10*3/uL (ref 4.0–10.5)

## 2017-12-19 LAB — TSH: TSH: 1.554 u[IU]/mL (ref 0.350–4.500)

## 2017-12-19 LAB — URINALYSIS, ROUTINE W REFLEX MICROSCOPIC
BILIRUBIN URINE: NEGATIVE
GLUCOSE, UA: NEGATIVE mg/dL
HGB URINE DIPSTICK: NEGATIVE
KETONES UR: NEGATIVE mg/dL
Leukocytes, UA: NEGATIVE
Nitrite: NEGATIVE
PROTEIN: NEGATIVE mg/dL
Specific Gravity, Urine: 1.012 (ref 1.005–1.030)
pH: 5 (ref 5.0–8.0)

## 2017-12-19 LAB — MAGNESIUM: Magnesium: 1.7 mg/dL (ref 1.7–2.4)

## 2017-12-19 LAB — D-DIMER, QUANTITATIVE: D-Dimer, Quant: <0.27 ug/mL-FEU (ref 0.00–0.50)

## 2017-12-19 LAB — TROPONIN I
TROPONIN I: 0.03 ng/mL — AB (ref <0.03)
Troponin I: 0.19 ng/mL (ref <0.03)

## 2017-12-19 MED ORDER — ZOLPIDEM TARTRATE 5 MG PO TABS: 5.0000 mg | ORAL_TABLET | Freq: Every evening | ORAL | Status: DC | PRN

## 2017-12-19 MED ORDER — ALBUTEROL SULFATE (2.5 MG/3ML) 0.083% IN NEBU: 3.0000 mL | INHALATION_SOLUTION | Freq: Four times a day (QID) | RESPIRATORY_TRACT | Status: DC | PRN

## 2017-12-19 MED ORDER — POTASSIUM CHLORIDE CRYS ER 20 MEQ PO TBCR
40.0000 meq | EXTENDED_RELEASE_TABLET | Freq: Once | ORAL | Status: AC
  Administered 2017-12-19: 40 meq via ORAL
  Filled 2017-12-19: qty 2

## 2017-12-19 MED ORDER — LOSARTAN POTASSIUM 50 MG PO TABS
100.0000 mg | ORAL_TABLET | Freq: Every day | ORAL | Status: DC
  Administered 2017-12-20: 100 mg via ORAL
  Filled 2017-12-19: qty 2

## 2017-12-19 MED ORDER — ASPIRIN 81 MG PO CHEW
324.0000 mg | CHEWABLE_TABLET | Freq: Once | ORAL | Status: AC
  Administered 2017-12-19: 324 mg via ORAL
  Filled 2017-12-19: qty 4

## 2017-12-19 MED ORDER — SODIUM CHLORIDE 0.9 % IV SOLN: 250.0000 mL | INTRAVENOUS | Status: DC | PRN

## 2017-12-19 MED ORDER — POTASSIUM CHLORIDE CRYS ER 20 MEQ PO TBCR
20.0000 meq | EXTENDED_RELEASE_TABLET | Freq: Every day | ORAL | Status: DC
  Filled 2017-12-19: qty 1

## 2017-12-19 MED ORDER — OMEGA-3-ACID ETHYL ESTERS 1 G PO CAPS
2.0000 | ORAL_CAPSULE | Freq: Two times a day (BID) | ORAL | Status: DC
  Administered 2017-12-20: 2 g via ORAL
  Filled 2017-12-19: qty 2

## 2017-12-19 MED ORDER — ACETAMINOPHEN 325 MG PO TABS
650.0000 mg | ORAL_TABLET | ORAL | Status: DC | PRN
Start: 2017-12-19 — End: 2017-12-20

## 2017-12-19 MED ORDER — SODIUM CHLORIDE 0.9% FLUSH: 3.0000 mL | INTRAVENOUS | Status: DC | PRN

## 2017-12-19 MED ORDER — VITAMIN D 1000 UNITS PO TABS
4000.0000 [IU] | ORAL_TABLET | Freq: Every morning | ORAL | Status: DC
  Administered 2017-12-20: 4000 [IU] via ORAL
  Filled 2017-12-19: qty 4

## 2017-12-19 MED ORDER — MAGNESIUM SULFATE 2 GM/50ML IV SOLN
2.0000 g | Freq: Once | INTRAVENOUS | Status: AC
  Administered 2017-12-20: 2 g via INTRAVENOUS
  Filled 2017-12-19: qty 50

## 2017-12-19 MED ORDER — ALLOPURINOL 100 MG PO TABS
200.0000 mg | ORAL_TABLET | Freq: Every morning | ORAL | Status: DC
  Administered 2017-12-20: 200 mg via ORAL
  Filled 2017-12-19: qty 2

## 2017-12-19 MED ORDER — HYDROMORPHONE HCL 2 MG PO TABS: 2.0000 mg | ORAL_TABLET | ORAL | Status: DC | PRN

## 2017-12-19 MED ORDER — ALPRAZOLAM 0.25 MG PO TABS
0.2500 mg | ORAL_TABLET | Freq: Two times a day (BID) | ORAL | Status: DC | PRN
Start: 2017-12-19 — End: 2017-12-20

## 2017-12-19 MED ORDER — ATORVASTATIN CALCIUM 40 MG PO TABS: 40.0000 mg | ORAL_TABLET | Freq: Every day | ORAL | Status: DC

## 2017-12-19 MED ORDER — ONDANSETRON HCL 4 MG/2ML IJ SOLN: 4.0000 mg | Freq: Four times a day (QID) | INTRAMUSCULAR | Status: DC | PRN

## 2017-12-19 MED ORDER — ASPIRIN EC 81 MG PO TBEC
81.0000 mg | DELAYED_RELEASE_TABLET | Freq: Every day | ORAL | Status: DC
  Administered 2017-12-20: 81 mg via ORAL
  Filled 2017-12-19: qty 1

## 2017-12-19 MED ORDER — POTASSIUM CHLORIDE 10 MEQ/100ML IV SOLN
10.0000 meq | Freq: Once | INTRAVENOUS | Status: AC
  Administered 2017-12-20: 10 meq via INTRAVENOUS
  Filled 2017-12-19: qty 100

## 2017-12-19 MED ORDER — NITROGLYCERIN 0.4 MG SL SUBL: 0.4000 mg | SUBLINGUAL_TABLET | SUBLINGUAL | Status: DC | PRN

## 2017-12-19 MED ORDER — SODIUM CHLORIDE 0.9% FLUSH
3.0000 mL | Freq: Two times a day (BID) | INTRAVENOUS | Status: DC
  Administered 2017-12-20 (×2): 3 mL via INTRAVENOUS

## 2017-12-19 MED ORDER — ENOXAPARIN SODIUM 40 MG/0.4ML ~~LOC~~ SOLN
40.0000 mg | SUBCUTANEOUS | Status: DC
  Administered 2017-12-20: 40 mg via SUBCUTANEOUS
  Filled 2017-12-19: qty 0.4

## 2017-12-19 NOTE — H&P (Signed)
History and Physical    REVERE MAAHS NFA:[id number] DOB: 21-Oct-1940 DOA: 12/19/2017  PCP: Chipper Herb, MD   Patient coming from: Home  Chief Complaint: Chest pain, SOB   HPI: Luis Deleon is a 77 y.o. male with medical history significant for prostate cancer status-post remote radical prostatectomy, Crohn's disease status post remote ileocecectomy, hypertension, and hyperlipidemia, now presenting to the emergency department after an episode of chest pain and shortness of breath.  Patient reports that he was in his usual state of health, undergoing some psychosocial stressors at home, and at rest when he experienced the acute onset of central chest discomfort with radiation to the anterior neck and dyspnea.  He called EMS, was found to be tachycardic in the 160s, and had normalization in his heart rate prior to arrival in the ED.  The chest discomfort and shortness of breath also resolved prior to arrival, lasting a total of 30-40 minutes.  Patient reports similar, less intense and shorter episodes in recent months for which he did not seek medical evaluation.  He also notes that he was told he had a transient tachyarrhythmia during surgery more than a decade ago.  ED Course: Upon arrival to the ED, patient is found to be afebrile, saturating well on room air, and with vitals otherwise normal.  EKG features a normal sinus rhythm.  Chest x-ray is notable for minimal right basilar atelectasis.  Chemistry panel reveals a potassium of 3.2 and CBC features a slight thrombocytopenia.  Initial troponin is 0.03, increasing to 0.193 hours later.  Urinalysis is unremarkable.  Patient was treated with 324 mg of aspirin and 40 mEq oral potassium in the ED.  Cardiology was consulted by the ED physician and a medical admission to [facility] was recommended for serial cardiac enzyme measurements.  Patient remains hemodynamically stable in the emergency department with no recurrence in his presenting  complaints, and he will be observed on the telemetry unit for ongoing evaluation and management of transient chest pain with mild troponin elevation, suspected secondary to transient tachyarrhythmia, likely SVT, or less likely atrial flutter.  Review of Systems:  All other systems reviewed and apart from HPI, are negative.  Past Medical History:  Diagnosis Date  . Abnormal LFTs (liver function tests)   . Arthritis   . Crohn disease ([place])   . Diverticulosis   . Fatty liver   . Gout   . Hepatic cyst   . Hyperlipemia   . Hyperplastic colon polyp 2012  . Hypertension   . Nephrolithiasis   . Polycythemia   . Prostate cancer ([place])    8 or 9 years  . Small bowel obstruction ([place]) 2003  . Vitamin D deficiency     Past Surgical History:  Procedure Laterality Date  . APPENDECTOMY  2003  . BACTERIAL OVERGROWTH TEST N/A 06/15/2016   Procedure: BACTERIAL OVERGROWTH TEST;  Surgeon: Md Physician Gastroenterology, MD;  Location: [facility] SUITE;  Service: Gastroenterology;  Laterality: N/A;  . EXPLORATORY LAPAROTOMY W/ BOWEL RESECTION  2003  . LITHOTRIPSY  1980  . PROSTATECTOMY    . TONSILLECTOMY    . VENTRAL HERNIA REPAIR     2 times     reports that he quit smoking about 26 years ago. His smoking use included cigarettes. He has a 38.00 pack-year smoking history. His smokeless tobacco use includes chew. He reports that he does not drink alcohol or use drugs.  Allergies  Allergen Reactions  . Colchicine Diarrhea, Nausea And  Vomiting and Other (See Comments)    REACTION: nausea, diarrhea and interactions with 50m    Family History  Problem Relation Age of Onset  . Heart disease Mother   . Cervical cancer Mother   . Heart disease Father   . Heart failure Father   . Dislocations Daughter 8       Bilateral knee  . Heart disease Maternal Uncle   . Colon cancer Neg Hx      Prior to Admission medications   Medication Sig Start Date End Date Taking? Authorizing Provider  allopurinol  (ZYLOPRIM) 100 MG tablet TAKE 2 TABLETS DAILY AS DIRECTED Patient taking differently: Take 200 mg by mouth every morning. TAKE 2 TABLETS DAILY AS DIRECTE 05/28/17  Yes MChipper Herb MD  aspirin 81 MG EC tablet Take 81 mg by mouth daily.     Yes [provider]  atorvastatin (LIPITOR) 40 MG tablet TAKE 1 TABLET EVERY DAY AS DIRECTED 10/01/17  Yes MChipper Herb MD  Cholecalciferol (VITAMIN D3) 1000 UNITS CAPS Take 4 capsules by mouth every morning.    Yes [provider]  fluticasone (FLONASE) 50 MCG/ACT nasal spray Place 2 sprays into both nostrils daily. Patient taking differently: Place 2 sprays into both nostrils at bedtime.  07/03/17  Yes MChipper Herb MD  gemfibrozil (LOPID) 600 MG tablet Take 1 tablet (600 mg total) by mouth daily. Patient taking differently: Take 600 mg by mouth every Monday, Wednesday, and Friday.  09/17/17  Yes MChipper Herb MD  hydrochlorothiazide (MICROZIDE) 12.5 MG capsule TAKE 1 TABLET (12.5 MG TOTAL) BY MOUTH DAILY. AS DIRECTED 12/05/17  Yes MChipper Herb MD  HYDROmorphone (DILAUDID) 2 MG tablet Take 1-2 capsules by mouth every 3 hours as needed Patient taking differently: 2-4 mg every 3 (three) hours as needed for moderate pain or severe pain (severe abdominal pain).  09/09/17  Yes Pyrtle, JLajuan Lines MD  KLOR-CON M20 20 MEQ tablet TAKE 1 TABLET TWICE DAILY ON MON, WED AND FRIDAY-TAKE 1 TABLET DAILY EVERY OTHER DAY 12/05/17  Yes MChipper Herb MD  losartan (COZAAR) 100 MG tablet TAKE 1 TABLET BY MOUTH EVERY DAY AS DIRECTED Patient taking differently: TAKE 1/2 TABLET BY MOUTH EVERY DAY AS DIRECTED 09/16/17  Yes MChipper Herb MD  nystatin-triamcinolone ([facility]) cream Apply 1 application topically 2 (two) times daily. Patient taking differently: Apply 1 application as needed topically.  08/22/15  Yes MChipper Herb MD  OMEGA-3 1000 MG CAPS Take 2 capsules by mouth 2 (two) times daily.   Yes [provider]  albuterol (PROVENTIL  HFA;VENTOLIN HFA) 108 (90 Base) MCG/ACT inhaler Inhale 2 puffs into the lungs every 6 (six) hours as needed for wheezing or shortness of breath. Patient not taking: Reported on 12/19/2017 11/06/17   VEustaquio Maize MD    Physical Exam: Vitals:   12/19/17 1700 12/19/17 1900 12/19/17 2030 12/19/17 2130  BP: 130/86 130/77 (!) 143/84 137/83  Pulse: 92 80 78 78  Resp: (!) 21 18 16 16   Temp:      TempSrc:      SpO2: 98% 98% 98% 95%      Constitutional: NAD, calm  Eyes: PERTLA, lids and conjunctivae normal ENMT: Mucous membranes are moist. Posterior pharynx clear of any exudate or lesions.   Neck: normal, supple, no masses, no thyromegaly Respiratory: clear to auscultation bilaterally, no wheezing, no crackles. Normal respiratory effort.  Cardiovascular: S1 & S2 heard, regular rate and rhythm. No extremity  edema.  Abdomen: No distension, no tenderness, no masses palpated. Bowel sounds normal.  Musculoskeletal: no clubbing / cyanosis. No joint deformity upper and lower extremities.   Skin: no significant rashes, lesions, ulcers. Warm, dry, well-perfused. Neurologic: CN 2-12 grossly intact. Sensation intact. Strength 5/5 in all 4 limbs.  Psychiatric: Alert and oriented x 3. Pleasant, cooperative.     Labs on Admission: I have personally reviewed following labs and imaging studies  CBC: Recent Labs  Lab 12/19/17 1611  WBC 8.1  NEUTROABS 5.9  HGB 16.6  HCT 49.0  MCV 95.1  PLT 867*   Basic Metabolic Panel: Recent Labs  Lab 12/19/17 1611 12/19/17 1634  NA 141  --   K 3.2*  --   CL 108  --   CO2 23  --   GLUCOSE 103*  --   BUN 14  --   CREATININE 1.11  --   CALCIUM 9.4  --   MG  --  1.7   GFR: CrCl cannot be calculated (Unknown ideal weight.). Liver Function Tests: No results for input(s): AST, ALT, ALKPHOS, BILITOT, PROT, ALBUMIN in the last 168 hours. No results for input(s): LIPASE, AMYLASE in the last 168 hours. No results for input(s): AMMONIA in the last 168  hours. Coagulation Profile: No results for input(s): INR, PROTIME in the last 168 hours. Cardiac Enzymes: Recent Labs  Lab 12/19/17 1611 12/19/17 1935  TROPONINI 0.03* 0.19*   BNP (last 3 results) No results for input(s): PROBNP in the last 8760 hours. HbA1C: No results for input(s): HGBA1C in the last 72 hours. CBG: No results for input(s): GLUCAP in the last 168 hours. Lipid Profile: No results for input(s): CHOL, HDL, LDLCALC, TRIG, CHOLHDL, LDLDIRECT in the last 72 hours. Thyroid Function Tests: Recent Labs    12/19/17 1634  TSH 1.554   Anemia Panel: No results for input(s): VITAMINB12, FOLATE, FERRITIN, TIBC, IRON, RETICCTPCT in the last 72 hours. Urine analysis:    Component Value Date/Time   COLORURINE YELLOW 12/19/2017 1700   APPEARANCEUR CLEAR 12/19/2017 1700   APPEARANCEUR Clear 10/24/2017 1207   LABSPEC 1.012 12/19/2017 1700   PHURINE 5.0 12/19/2017 1700   GLUCOSEU NEGATIVE 12/19/2017 1700   HGBUR NEGATIVE 12/19/2017 1700   BILIRUBINUR NEGATIVE 12/19/2017 1700   BILIRUBINUR Negative 10/24/2017 1207   KETONESUR NEGATIVE 12/19/2017 1700   PROTEINUR NEGATIVE 12/19/2017 1700   UROBILINOGEN negative 11/25/2014 0900   NITRITE NEGATIVE 12/19/2017 1700   LEUKOCYTESUR NEGATIVE 12/19/2017 1700   LEUKOCYTESUR Negative 10/24/2017 1207   Sepsis Labs: @LABRCNTIP (procalcitonin:4,lacticidven:4) )No results found for this or any previous visit (from the past 240 hour(s)).   Radiological Exams on Admission: Dg Chest Portable 1 View  Result Date: 12/19/2017 CLINICAL DATA:  Intermittent chest tightness and shortness of breath for 2 weeks worse today, former smoker, history prostate cancer, hypertension EXAM: PORTABLE CHEST 1 VIEW COMPARISON:  Portable exam 1622 hours compared to 10/24/2017 FINDINGS: Normal heart size, mediastinal contours, and pulmonary vascularity. Atherosclerotic calcification aorta. Chronic elevation of RIGHT diaphragm with minimal RIGHT basilar  atelectasis. No acute infiltrate, pleural effusion, or pneumothorax. Bones unremarkable. IMPRESSION: Minimal RIGHT basilar atelectasis. No acute infiltrate. Electronically Signed   By: Lavonia Dana M.D.   On: 12/19/2017 16:31    EKG: Independently reviewed. Normal sinus rhythm.   Assessment/Plan  1. Chest pain; elevated troponin   - Presents following an episode of chest discomfort with SOB at rest, found by EMS to be in a tachyarrhythmia with rate 160's  - In a  normal sinus rhythm and asymptomatic on arrival in ED  - CXR unremarkable; first troponin 0.03, increased to 0.19 three hours later  - Treated with ASA 324 mg in ED  - EDP discussed case with cardiology; likely secondary to transient arrhythmia and recommended observing on telemetry here at [facility] with serial cardiac enzyme measurements  - Continue cardiac monitoring, trend troponin, continue ASA, statin, ARB   2. Tachyarrhythmia  - EMS found patient to have resting HR in 160's, normalizing spontaneously prior to arrival in ED  - Rhythm strips reviewed, likely SVT though atrial flutter difficult to exclude  - Echo from 2014 with no left atrial enlargement or significant valvular disease - Continue cardiac monitoring, replace potassium to 4 and magnesium to 2    3. Hypokalemia  - Serum potassium is 3.2 on admission  - Treated with 40 mEq oral potassium in ED  - Continue cardiac monitoring, repeat chem panel in am    4. Hypertension  - BP at goal  - Continue losartan, hold HCTZ while correcting electrolytes     5. Hyperlipidemia  - Continue statin and Lovaza    6. Prostate cancer  - Status-post radical prostatectomy >10 yrs ago  - PSA undetectable in January 2019    7. Crohn's disease  - Status-post ileocecectomy in 2003  - Has been quiescent     DVT prophylaxis: Lovenox Code Status: Full  Family Communication: Discussed with patient Consults called: ED physician discussed case with cardiology; not formally  consulted  Admission status: Observation    Luis Bulls, MD [facility] Pager [phone]  If 7PM-7AM, please contact night-coverage [email or web address] Password [facility]  12/19/2017, 9:58 PM

## 2017-12-19 NOTE — ED Provider Notes (Signed)
[facility] EMERGENCY DEPARTMENT Provider Note   CSN: [id number] Arrival date & time: 12/19/17  1556     History   Chief Complaint Chief Complaint  Patient presents with  . SVT    HPI Luis Deleon is a 77 y.o. male.  HPI Patient presents with acute onset central chest tightness radiating into his throat and shortness of breath starting around 11 AM today.  Denies diaphoresis, nausea or vomiting.  Denied palpitations.  No recent changes to his medication.  Patient does drink frequent caffeinated sodas but no recent changes.  Was noted to be in a tachydysrhythmia with heart rate in the 160s when EMS arrived.  Was a regular rate.  Patient spontaneously converted to a normal sinus rhythm en route.  Currently he denies any symptoms. Past Medical History:  Diagnosis Date  . Abnormal LFTs (liver function tests)   . Arthritis   . Crohn disease ([place])   . Diverticulosis   . Fatty liver   . Gout   . Hepatic cyst   . Hyperlipemia   . Hyperplastic colon polyp 2012  . Hypertension   . Nephrolithiasis   . Polycythemia   . Prostate cancer ([place])    8 or 9 years  . Small bowel obstruction ([place]) 2003  . Vitamin D deficiency     Patient Active Problem List   Diagnosis Date Noted  . Hypokalemia 12/19/2017  . Tachyarrhythmia 12/19/2017  . Chest pain 12/19/2017  . Thrombocytopenia ([place]) 04/05/2015  . Abdominal aortic atherosclerosis ([place]) 11/25/2014  . Metabolic syndrome 93/79/0240  . DOE (dyspnea on exertion) 08/11/2013  . 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 POLYCYTHEMIA 11/03/2008  . LIVER FUNCTION TESTS, ABNORMAL, HX OF 09/24/2008  . Hyperlipidemia 07/28/2008  . GOUT, UNSPECIFIED 07/28/2008  . HTN (hypertension) 07/28/2008  . Crohn disease ([place]) 07/28/2008  . DIVERTICULOSIS, COLON 07/28/2008  . Prostate cancer ([place]) 07/28/2008  . SMALL BOWEL OBSTRUCTION, HX OF 07/28/2008  . NEPHROLITHIASIS, HX OF 07/28/2008    Past Surgical History:  Procedure Laterality Date  .  APPENDECTOMY  2003  . BACTERIAL OVERGROWTH TEST N/A 06/15/2016   Procedure: BACTERIAL OVERGROWTH TEST;  Surgeon: Md Physician Gastroenterology, MD;  Location: [facility] ENDO SUITE;  Service: Gastroenterology;  Laterality: N/A;  . EXPLORATORY LAPAROTOMY W/ BOWEL RESECTION  2003  . LITHOTRIPSY  1980  . PROSTATECTOMY    . TONSILLECTOMY    . VENTRAL HERNIA REPAIR     2 times       Home Medications    Prior to Admission medications   Medication Sig Start Date End Date Taking? Authorizing Provider  allopurinol (ZYLOPRIM) 100 MG tablet TAKE 2 TABLETS DAILY AS DIRECTED Patient taking differently: Take 200 mg by mouth every morning. TAKE 2 TABLETS DAILY AS DIRECTE 05/28/17  Yes Chipper Herb, MD  aspirin 81 MG EC tablet Take 81 mg by mouth daily.     Yes [provider]  atorvastatin (LIPITOR) 40 MG tablet TAKE 1 TABLET EVERY DAY AS DIRECTED 10/01/17  Yes Chipper Herb, MD  Cholecalciferol (VITAMIN D3) 1000 UNITS CAPS Take 4 capsules by mouth every morning.    Yes [provider]  fluticasone (FLONASE) 50 MCG/ACT nasal spray Place 2 sprays into both nostrils daily. Patient taking differently: Place 2 sprays into both nostrils at bedtime.  07/03/17  Yes Chipper Herb, MD  gemfibrozil (LOPID) 600 MG tablet Take 1 tablet (600 mg total) by mouth daily. Patient taking differently: Take 600 mg by mouth every Monday, Wednesday,  and Friday.  09/17/17  Yes Chipper Herb, MD  hydrochlorothiazide (MICROZIDE) 12.5 MG capsule TAKE 1 TABLET (12.5 MG TOTAL) BY MOUTH DAILY. AS DIRECTED 12/05/17  Yes Chipper Herb, MD  HYDROmorphone (DILAUDID) 2 MG tablet Take 1-2 capsules by mouth every 3 hours as needed Patient taking differently: 2-4 mg every 3 (three) hours as needed for moderate pain or severe pain (severe abdominal pain).  09/09/17  Yes Pyrtle, Lajuan Lines, MD  KLOR-CON M20 20 MEQ tablet TAKE 1 TABLET TWICE DAILY ON MON, WED AND FRIDAY-TAKE 1 TABLET DAILY EVERY OTHER DAY 12/05/17  Yes Chipper Herb, MD  losartan (COZAAR) 100 MG tablet TAKE 1 TABLET BY MOUTH EVERY DAY AS DIRECTED Patient taking differently: TAKE 1/2 TABLET BY MOUTH EVERY DAY AS DIRECTED 09/16/17  Yes Chipper Herb, MD  nystatin-triamcinolone [facility] II) cream Apply 1 application topically 2 (two) times daily. Patient taking differently: Apply 1 application as needed topically.  08/22/15  Yes Chipper Herb, MD  OMEGA-3 1000 MG CAPS Take 2 capsules by mouth 2 (two) times daily.   Yes [provider]  albuterol (PROVENTIL HFA;VENTOLIN HFA) 108 (90 Base) MCG/ACT inhaler Inhale 2 puffs into the lungs every 6 (six) hours as needed for wheezing or shortness of breath. Patient not taking: Reported on 12/19/2017 11/06/17   Eustaquio Maize, MD    Family History Family History  Problem Relation Age of Onset  . Heart disease Mother   . Cervical cancer Mother   . Heart disease Father   . Heart failure Father   . Dislocations Daughter 8       Bilateral knee  . Heart disease Maternal Uncle   . Colon cancer Neg Hx     Social History Social History   Tobacco Use  . Smoking status: Former Smoker    Packs/day: 1.00    Years: 38.00    Pack years: 38.00    Types: Cigarettes    Last attempt to quit: 03/19/1991    Years since quitting: 26.7  . Smokeless tobacco: Current User    Types: Chew  Substance Use Topics  . Alcohol use: No  . Drug use: No     Allergies   Colchicine   Review of Systems Review of Systems  Constitutional: Negative for chills and fever.  HENT: Negative for congestion, sore throat and trouble swallowing.   Eyes: Negative for visual disturbance.  Respiratory: Positive for chest tightness and shortness of breath. Negative for cough and wheezing.   Cardiovascular: Positive for chest pain. Negative for palpitations and leg swelling.  Gastrointestinal: Negative for abdominal pain, diarrhea, nausea and vomiting.  Genitourinary: Negative for dysuria, flank pain and frequency.    Musculoskeletal: Negative for back pain, myalgias, neck pain and neck stiffness.  Skin: Negative for rash and wound.  Neurological: Negative for dizziness, weakness, light-headedness, numbness and headaches.  All other systems reviewed and are negative.    Physical Exam Updated Vital Signs BP 137/83   Pulse 78   Temp 98.5 F (36.9 C) (Oral)   Resp 16   SpO2 95%   Physical Exam  Constitutional: He is oriented to person, place, and time. He appears well-developed and well-nourished. No distress.  HENT:  Head: Normocephalic and atraumatic.  Mouth/Throat: Oropharynx is clear and moist. No oropharyngeal exudate.  Eyes: EOM are normal. Pupils are equal, round, and reactive to light.  Neck: Normal range of motion. Neck supple. No JVD present. No thyromegaly present.  Cardiovascular: Normal rate  and regular rhythm. Exam reveals no gallop and no friction rub.  No murmur heard. Pulmonary/Chest: Effort normal and breath sounds normal. No stridor. No respiratory distress. He has no wheezes. He has no rales. He exhibits no tenderness.  Abdominal: Soft. Bowel sounds are normal. There is no tenderness. There is no rebound and no guarding.  Musculoskeletal: Normal range of motion. He exhibits no edema or tenderness.  No midline thoracic or lumbar tenderness.  No CVA tenderness.  No lower extremity swelling, asymmetry or pain.  Distal pulses are 2+.  Lymphadenopathy:    He has no cervical adenopathy.  Neurological: He is alert and oriented to person, place, and time.  Moving all extremities without focal deficit.  Sensation fully intact.  Skin: Skin is warm and dry. No rash noted. He is not diaphoretic. No erythema.  Psychiatric: He has a normal mood and affect. His behavior is normal.  Nursing note and vitals reviewed.    ED Treatments / Results  Labs (all labs ordered are listed, but only abnormal results are displayed) Labs Reviewed  CBC WITH DIFFERENTIAL/PLATELET - Abnormal; Notable  for the following components:      Result Value   Platelets 147 (*)    All other components within normal limits  BASIC METABOLIC PANEL - Abnormal; Notable for the following components:   Potassium 3.2 (*)    Glucose, Bld 103 (*)    All other components within normal limits  TROPONIN I - Abnormal; Notable for the following components:   Troponin I 0.03 (*)    All other components within normal limits  TROPONIN I - Abnormal; Notable for the following components:   Troponin I 0.19 (*)    All other components within normal limits  URINALYSIS, ROUTINE W REFLEX MICROSCOPIC  MAGNESIUM  D-DIMER, QUANTITATIVE (NOT AT [facility])  TSH  TROPONIN I  TROPONIN I  BASIC METABOLIC PANEL  CBC    EKG  EKG Interpretation None       Radiology Dg Chest Portable 1 View  Result Date: 12/19/2017 CLINICAL DATA:  Intermittent chest tightness and shortness of breath for 2 weeks worse today, former smoker, history prostate cancer, hypertension EXAM: PORTABLE CHEST 1 VIEW COMPARISON:  Portable exam 1622 hours compared to 10/24/2017 FINDINGS: Normal heart size, mediastinal contours, and pulmonary vascularity. Atherosclerotic calcification aorta. Chronic elevation of RIGHT diaphragm with minimal RIGHT basilar atelectasis. No acute infiltrate, pleural effusion, or pneumothorax. Bones unremarkable. IMPRESSION: Minimal RIGHT basilar atelectasis. No acute infiltrate. Electronically Signed   By: Lavonia Dana M.D.   On: 12/19/2017 16:31    Procedures Procedures (including critical care time)  Medications Ordered in ED Medications  potassium chloride SA (K-DUR,KLOR-CON) CR tablet 40 mEq (not administered)  aspirin chewable tablet 324 mg (not administered)  allopurinol (ZYLOPRIM) tablet 200 mg (not administered)  aspirin EC tablet 81 mg (not administered)  atorvastatin (LIPITOR) tablet 40 mg (not administered)  Vitamin D3 CAPS 4,000 Units (not administered)  HYDROmorphone (DILAUDID) tablet 2-4 mg (not  administered)  potassium chloride SA (K-DUR,KLOR-CON) CR tablet 20 mEq (not administered)  losartan (COZAAR) tablet 100 mg (not administered)  Omega-3 CAPS 2,000 mg (not administered)  albuterol (PROVENTIL) (2.5 MG/3ML) 0.083% nebulizer solution 3 mL (not administered)  nitroGLYCERIN (NITROSTAT) SL tablet 0.4 mg (not administered)  acetaminophen (TYLENOL) tablet 650 mg (not administered)  ondansetron (ZOFRAN) injection 4 mg (not administered)  enoxaparin (LOVENOX) injection 40 mg (not administered)  sodium chloride flush (NS) 0.9 % injection 3 mL (not administered)  sodium chloride flush (  NS) 0.9 % injection 3 mL (not administered)  0.9 %  sodium chloride infusion (not administered)  zolpidem (AMBIEN) tablet 5 mg (not administered)  ALPRAZolam (XANAX) tablet 0.25 mg (not administered)  magnesium sulfate IVPB 2 g 50 mL (not administered)     Initial Impression / Assessment and Plan / ED Course  I have reviewed the triage vital signs and the nursing notes.  Pertinent labs & imaging results that were available during my care of the patient were reviewed by me and considered in my medical decision making (see chart for details).    Initial troponin is 0.03.  Continues to be asymptomatic in the emergency department.  Delta troponin has risen to 0.19.  Discussed with cardiologist.  Recommends admission for trending troponins and cardiology consult in the morning.  Patient given dose of aspirin in the ED.  Vital signs remained stable.  Review of EMS run sheets reveals SVT versus atrial flutter with 2 1 block.  Discussed with Dr. Myna Hidalgo.  Will see patient in the emergency department and admit.   Final Clinical Impressions(s) / ED Diagnoses   Final diagnoses:  Precordial chest pain  Rapid palpitations    ED Discharge Orders    None       Julianne Rice, MD 12/19/17 2204

## 2017-12-19 NOTE — ED Notes (Signed)
CRITICAL VALUE ALERT  Critical Value:  Troponin 0.03  Date & Time Notied:  12/19/17 1656  Provider Notified: Dr. Lita Mains  Orders Received/Actions taken: EDP notified, no further orders at this time.

## 2017-12-19 NOTE — Telephone Encounter (Signed)
Patient called in stating that he is having SOB and chest pain. Patient and his wife were advised that patient needs to be evaluated by the ER. Patient states he really did not want to go and I advised him and his wife of the importance.

## 2017-12-19 NOTE — ED Notes (Signed)
Pt requesting something to eat, Dr Lita Mains advised that pt could eat, [facility] notified for sandwich.

## 2017-12-19 NOTE — ED Notes (Signed)
Critical result trop. 0.19. [place] notified.

## 2017-12-19 NOTE — ED Triage Notes (Signed)
Ems reports pt c/o chest pain and sob.  EMS says pt was in SVT on monitor with rate 166.  Pt converted on his own with ems.  EMS reprots hr has been approx 100-102 during transport.  Pt alert and oriented.

## 2017-12-20 ENCOUNTER — Observation Stay (HOSPITAL_BASED_OUTPATIENT_CLINIC_OR_DEPARTMENT_OTHER): Payer: Medicare HMO

## 2017-12-20 ENCOUNTER — Other Ambulatory Visit: Payer: Self-pay

## 2017-12-20 DIAGNOSIS — R7989 Other specified abnormal findings of blood chemistry: Secondary | ICD-10-CM | POA: Diagnosis not present

## 2017-12-20 DIAGNOSIS — R000 Tachycardia, unspecified: Secondary | ICD-10-CM | POA: Diagnosis not present

## 2017-12-20 DIAGNOSIS — I10 Essential (primary) hypertension: Secondary | ICD-10-CM | POA: Diagnosis not present

## 2017-12-20 DIAGNOSIS — K5090 Crohn's disease, unspecified, without complications: Secondary | ICD-10-CM | POA: Diagnosis not present

## 2017-12-20 DIAGNOSIS — R0600 Dyspnea, unspecified: Secondary | ICD-10-CM

## 2017-12-20 DIAGNOSIS — Z8546 Personal history of malignant neoplasm of prostate: Secondary | ICD-10-CM | POA: Diagnosis not present

## 2017-12-20 DIAGNOSIS — R0602 Shortness of breath: Secondary | ICD-10-CM | POA: Diagnosis not present

## 2017-12-20 DIAGNOSIS — R072 Precordial pain: Secondary | ICD-10-CM | POA: Diagnosis not present

## 2017-12-20 DIAGNOSIS — R69 Illness, unspecified: Secondary | ICD-10-CM | POA: Diagnosis not present

## 2017-12-20 DIAGNOSIS — R079 Chest pain, unspecified: Secondary | ICD-10-CM | POA: Diagnosis not present

## 2017-12-20 DIAGNOSIS — E7800 Pure hypercholesterolemia, unspecified: Secondary | ICD-10-CM | POA: Diagnosis not present

## 2017-12-20 DIAGNOSIS — E785 Hyperlipidemia, unspecified: Secondary | ICD-10-CM | POA: Diagnosis not present

## 2017-12-20 DIAGNOSIS — C61 Malignant neoplasm of prostate: Secondary | ICD-10-CM | POA: Diagnosis not present

## 2017-12-20 DIAGNOSIS — E876 Hypokalemia: Secondary | ICD-10-CM | POA: Diagnosis not present

## 2017-12-20 LAB — BASIC METABOLIC PANEL
ANION GAP: 8 (ref 5–15)
BUN: 15 mg/dL (ref 6–20)
CO2: 24 mmol/L (ref 22–32)
Calcium: 9 mg/dL (ref 8.9–10.3)
Chloride: 107 mmol/L (ref 101–111)
Creatinine, Ser: 1.08 mg/dL (ref 0.61–1.24)
GFR calc Af Amer: >60 mL/min (ref >60)
GFR calc non Af Amer: >60 mL/min (ref >60)
Glucose, Bld: 92 mg/dL (ref 65–99)
POTASSIUM: 3.4 mmol/L — AB (ref 3.5–5.1)
Sodium: 139 mmol/L (ref 135–145)

## 2017-12-20 LAB — CBC
HEMATOCRIT: 48.4 % (ref 39.0–52.0)
HEMOGLOBIN: 16.1 g/dL (ref 13.0–17.0)
MCH: 31.5 pg (ref 26.0–34.0)
MCHC: 33.3 g/dL (ref 30.0–36.0)
MCV: 94.7 fL (ref 78.0–100.0)
Platelets: 136 10*3/uL — ABNORMAL LOW (ref 150–400)
RBC: 5.11 MIL/uL (ref 4.22–5.81)
RDW: 14.5 % (ref 11.5–15.5)
WBC: 7.8 10*3/uL (ref 4.0–10.5)

## 2017-12-20 LAB — MAGNESIUM: Magnesium: 2 mg/dL (ref 1.7–2.4)

## 2017-12-20 LAB — TROPONIN I
TROPONIN I: 0.15 ng/mL — AB (ref <0.03)
TROPONIN I: 0.09 ng/mL — AB (ref <0.03)
Troponin I: 0.25 ng/mL (ref <0.03)

## 2017-12-20 LAB — ECHOCARDIOGRAM COMPLETE

## 2017-12-20 MED ORDER — LOSARTAN POTASSIUM 100 MG PO TABS: ORAL_TABLET | ORAL | Status: DC

## 2017-12-20 MED ORDER — POTASSIUM CHLORIDE CRYS ER 20 MEQ PO TBCR
40.0000 meq | EXTENDED_RELEASE_TABLET | Freq: Every day | ORAL | Status: DC
  Administered 2017-12-20: 40 meq via ORAL
  Filled 2017-12-20: qty 2

## 2017-12-20 MED ORDER — METOPROLOL TARTRATE 25 MG PO TABS
25.0000 mg | ORAL_TABLET | Freq: Two times a day (BID) | ORAL | Status: DC
  Administered 2017-12-20: 25 mg via ORAL
  Filled 2017-12-20: qty 1

## 2017-12-20 MED ORDER — ENOXAPARIN SODIUM 60 MG/0.6ML ~~LOC~~ SOLN: 50.0000 mg | SUBCUTANEOUS | Status: DC

## 2017-12-20 MED ORDER — POTASSIUM CHLORIDE CRYS ER 20 MEQ PO TBCR: 40.0000 meq | EXTENDED_RELEASE_TABLET | Freq: Every day | ORAL | 1 refills | Status: DC

## 2017-12-20 MED ORDER — METOPROLOL TARTRATE 25 MG PO TABS: 25.0000 mg | ORAL_TABLET | Freq: Two times a day (BID) | ORAL | 1 refills | Status: DC

## 2017-12-20 NOTE — Discharge Summary (Signed)
Physician Discharge Summary  Luis Deleon VZC:[id number] DOB: Jan 18, 1941 DOA: 12/19/2017  PCP: Chipper Herb, MD  Admit date: 12/19/2017 Discharge date: 12/20/2017  Time spent: 35 minutes  Recommendations for Outpatient Follow-up:  1. Repeat basic metabolic panel to follow electrolytes and renal function 2. Reassess blood pressure and further adjust antihypertensive regimen as needed 3. Make sure patient has follow-up with cardiology service as instructed for event monitoring.   Discharge Diagnoses:  Principal Problem:   Chest pain Active Problems:   Hyperlipidemia   HTN (hypertension)   Crohn disease ([place])   Prostate cancer ([facility])   Thrombocytopenia ([facility])   Hypokalemia   Tachyarrhythmia   Discharge Condition: Stable and improved.  Patient has been discharged home with instruction to follow-up with PCP in 10 days and to follow-up with cardiology service for event monitoring as an outpatient.  Diet recommendation: Heart healthy diet  Filed Weights   12/20/17 1318  Weight: 108.9 kg (240 lb)    History of present illness:  As per H&P written by Dr. Myna Hidalgo on 12/19/17 77 y.o. male with medical history significant for prostate cancer status-post remote radical prostatectomy, Crohn's disease status post remote ileocecectomy, hypertension, and hyperlipidemia, now presenting to the emergency department after an episode of chest pain and shortness of breath.  Patient reports that he was in his usual state of health, undergoing some psychosocial stressors at home, and at rest when he experienced the acute onset of central chest discomfort with radiation to the anterior neck and dyspnea.  He called EMS, was found to be tachycardic in the 160s, and had normalization in his heart rate prior to arrival in the ED.  The chest discomfort and shortness of breath also resolved prior to arrival, lasting a total of 30-40 minutes.  Patient reports similar, less intense and shorter episodes in recent months  for which he did not seek medical evaluation.  He also notes that he was told he had a transient tachyarrhythmia during surgery more than a decade ago.  Hospital Course:  1. Chest pain; elevated troponin   - Presents following an episode of chest discomfort with SOB at rest, found by EMS to be in a tachyarrhythmia with rate 160's  -Symptoms completely resolved after his heart rate has been controlled -Following cardiology recommendations no further inpatient workup is needed at this time. -Mild troponin elevation in the setting of demand ischemia with transient tachyarrhythmia -Patient will be discharged on beta blocker (Lopressor 25 mg twice a day) and instructions to continue the use of aspirin. -Event monitoring will be done as an outpatient and further decisions on treatment will be made based on that assessment. -Okay to resume the use of ARB and also statins.  2. Tachyarrhythmia  - EMS found patient to have resting HR in 160's, normalizing spontaneously prior to arrival in ED  -Rhythm strips reviewed, likely SVT though atrial flutter difficult to exclude. -Now back to sinus rhythm and denying palpitations -Following cardiology recommendations patient will be started on Lopressor 25 mg twice a day and has been advised to closely follow his electrolytes maintaining potassium above 4 and his magnesium above 2 -Plan is for outpatient event monitoring and further decide treatment based on findings. -2D echo demonstrated normal ejection fraction, no wall motion abnormalities or significant valvular disease.   3. Hypokalemia  -Potassium has been repleted -At discharge he will be using potassium supplementation for maintenance 40 mEq on daily basis.   -Repeat basic metabolic panel at follow-up to assess electrolytes  trend.  4. Hypertension  -BP well controlled. -Continue losartan (Dose) -Continue Lopressor and will resume the use of HCTZ. -Patient advised to follow heart healthy  diet.  5. Hyperlipidemia  -Continue statin and Lovaza   -Advised to follow heart healthy diet.  6. Prostate cancer  -Status-post radical prostatectomy >10 yrs ago  -PSA undetectable in January 2019  -Continue outpatient follow-up -No symptoms of urinary retention.   7. Crohn's disease  -Status-post ileocecectomy in 2003  -Has been quiescent   -Continue outpatient follow-up.  Procedures:  2D echo - Left ventricle: The cavity size was normal. Wall thickness was   increased in a pattern of mild LVH. Systolic function was normal.   The estimated ejection fraction was in the range of 60% to 65%.   Wall motion was normal; there were no regional wall motion   abnormalities. Left ventricular diastolic function parameters   were normal. - Aortic valve: Mildly calcified annulus. Trileaflet; mildly   thickened leaflets. Valve area (VTI): 2.7 cm^2. Valve area   (Vmax): 2.61 cm^2. Valve area (Vmean): 2.3 cm^2. - Mitral valve: Mildly calcified annulus. Normal thickness leaflets  Consultations:  Cardiology  Discharge Exam: Vitals:   12/20/17 0500 12/20/17 1318  BP: 125/83 125/67  Pulse: 85 67  Resp: 17 18  Temp: 98 F (36.7 C) 98.2 F (36.8 C)  SpO2: 98% 96%    General: Afebrile, no chest pain, denies palpitations, no shortness of breath; feeling back to his baseline. Cardiovascular: S1 and S2, no rubs, no gallops, no JVD. Respiratory: Good air movement bilaterally, no wheezing, no crackles Abdomen: Soft, nontender, nondistended, positive bowel sounds Extremities: No edema, no cyanosis, no clubbing  Discharge Instructions   Discharge Instructions    Diet - low sodium heart healthy   Complete by:  As directed    Discharge instructions   Complete by:  As directed    Take medications as prescribed Arrange follow-up with PCP in 10 days Follow-up with cardiology service for event monitoring test Keep yourself well hydrated Follow heart healthy diet     Allergies as  of 12/20/2017      Reactions   Colchicine Diarrhea, Nausea And Vomiting, Other (See Comments)   REACTION: nausea, diarrhea and interactions with 30m      Medication List    TAKE these medications   albuterol 108 (90 Base) MCG/ACT inhaler Commonly known as:  PROVENTIL HFA;VENTOLIN HFA Inhale 2 puffs into the lungs every 6 (six) hours as needed for wheezing or shortness of breath.   allopurinol 100 MG tablet Commonly known as:  ZYLOPRIM TAKE 2 TABLETS DAILY AS DIRECTED What changed:    how much to take  how to take this  when to take this  additional instructions   aspirin 81 MG EC tablet Take 81 mg by mouth daily.   atorvastatin 40 MG tablet Commonly known as:  LIPITOR TAKE 1 TABLET EVERY DAY AS DIRECTED   fluticasone 50 MCG/ACT nasal spray Commonly known as:  FLONASE Place 2 sprays into both nostrils daily. What changed:  when to take this   gemfibrozil 600 MG tablet Commonly known as:  LOPID Take 1 tablet (600 mg total) by mouth daily. What changed:  when to take this   hydrochlorothiazide 12.5 MG capsule Commonly known as:  MICROZIDE TAKE 1 TABLET (12.5 MG TOTAL) BY MOUTH DAILY. AS DIRECTED   HYDROmorphone 2 MG tablet Commonly known as:  DILAUDID Take 1-2 capsules by mouth every 3 hours as needed What changed:  how much to take  when to take this  reasons to take this  additional instructions   losartan 100 MG tablet Commonly known as:  COZAAR TAKE 1/2 TABLET BY MOUTH EVERY DAY AS DIRECTED What changed:    how much to take  how to take this  when to take this  additional instructions   metoprolol tartrate 25 MG tablet Commonly known as:  LOPRESSOR Take 1 tablet (25 mg total) by mouth 2 (two) times daily.   nystatin-triamcinolone cream Commonly known as:  MYCOLOG II Apply 1 application topically 2 (two) times daily. What changed:    when to take this  reasons to take this   Omega-3 1000 MG Caps Take 2 capsules by mouth 2 (two)  times daily.   potassium chloride SA 20 MEQ tablet Commonly known as:  KLOR-CON M20 Take 2 tablets (40 mEq total) by mouth daily. Start taking on:  12/21/2017 What changed:  See the new instructions.   Vitamin D3 1000 units Caps Take 4 capsules by mouth every morning.      Allergies  Allergen Reactions  . Colchicine Diarrhea, Nausea And Vomiting and Other (See Comments)    REACTION: nausea, diarrhea and interactions with 31m   Follow-up Information    MChipper Herb MD. Schedule an appointment as soon as possible for a visit in 10 day(s).   Specialty:  Family Medicine Contact information: [phone]        BArnoldo Lenis MD Follow up today.   Specialty:  Cardiology Why:  office will ocntact you with event monitoring and appointment details. Contact information: [address]  [phone]          The results of significant diagnostics from this hospitalization (including imaging, microbiology, ancillary and laboratory) are listed below for reference.    Significant Diagnostic Studies: Dg Chest Portable 1 View  Result Date: 12/19/2017 CLINICAL DATA:  Intermittent chest tightness and shortness of breath for 2 weeks worse today, former smoker, history prostate cancer, hypertension EXAM: PORTABLE CHEST 1 VIEW COMPARISON:  Portable exam 1622 hours compared to 10/24/2017 FINDINGS: Normal heart size, mediastinal contours, and pulmonary vascularity. Atherosclerotic calcification aorta. Chronic elevation of RIGHT diaphragm with minimal RIGHT basilar atelectasis. No acute infiltrate, pleural effusion, or pneumothorax. Bones unremarkable. IMPRESSION: Minimal RIGHT basilar atelectasis. No acute infiltrate. Electronically Signed   By: MLavonia DanaM.D.   On: 12/19/2017 16:31   Labs: Basic Metabolic Panel: Recent Labs  Lab 12/19/17 1611 12/19/17 1634 12/20/17 0657  NA 141  --  139  K 3.2*  --  3.4*  CL 108   --  107  CO2 23  --  24  GLUCOSE 103*  --  92  BUN 14  --  15  CREATININE 1.11  --  1.08  CALCIUM 9.4  --  9.0  MG  --  1.7 2.0   CBC: Recent Labs  Lab 12/19/17 1611 12/20/17 0657  WBC 8.1 7.8  NEUTROABS 5.9  --   HGB 16.6 16.1  HCT 49.0 48.4  MCV 95.1 94.7  PLT 147* 136*   Cardiac Enzymes: Recent Labs  Lab 12/19/17 1611 12/19/17 1935 12/20/17 0133 12/20/17 0657 12/20/17 1225  TROPONINI 0.03* 0.19* 0.25* 0.15* 0.09*    Signed:  CBarton DuboisMD.  [facility] 12/20/2017, 3:18 PM

## 2017-12-20 NOTE — Progress Notes (Signed)
CRITICAL VALUE ALERT  Critical Value: Troponin=0.25  Date & Time Notied:  12/20/17 0227  Provider Notified: Dr. Darrick Meigs  Orders Received/Actions taken: No new orders given

## 2017-12-20 NOTE — Consult Note (Signed)
Cardiology Consultation:   Patient ID: ALTIN SEASE; [id number]; [birth year]   Admit date: 12/19/2017 Date of Consult: 12/20/2017  Primary Care Provider: Chipper Herb, MD Primary Cardiologist: Dr Bronson Ing Primary Electrophysiologist:  n/a   Patient Profile:   Luis Deleon is a 77 y.o. male with a hx of HTN and HL who is being seen today for the evaluation of chest pani and SOB at the request of Dr Dyann Kief.  History of Present Illness:   Mr. Nan 77 yo male history of prostate cancer, HL, HTN, Crohns disease, admitted with chest tightness and SOB From notes HRs 160s during initial EMS evaluation reported as SVT, spontneously converted   Patient reports episode yesterday midday of sudden onsent SOB and chest tightness. No palpitations. Symptoms lasted just a few minutes and then resolved on there own. Reports similar prior episodes, one every 2-3 days lasting just a few minutes at a time. Drinks 3 sodas a day, no coffee, tea, energy drinks or EtOH.  08/2013 nuclear stress: no ischemia 08/2013 echo LVEF 60-65%, no WMAs, grade I diastolic dysfunction WBC 8.1 Hgb 16.6 Plt 147 K 3.2 Cr 1.11 Mg 1.7 D-dimer neg TSH 1.55  Trop 0.03-->0.19-->0.25-->0.15--> CXR no acute process  Past Medical History:  Diagnosis Date  . Abnormal LFTs (liver function tests)   . Arthritis   . Crohn disease ([place])   . Diverticulosis   . Fatty liver   . Gout   . Hepatic cyst   . Hyperlipemia   . Hyperplastic colon polyp 2012  . Hypertension   . Nephrolithiasis   . Polycythemia   . Prostate cancer ([place])    8 or 9 years  . Small bowel obstruction ([place]) 2003  . Vitamin D deficiency     Past Surgical History:  Procedure Laterality Date  . APPENDECTOMY  2003  . BACTERIAL OVERGROWTH TEST N/A 06/15/2016   Procedure: BACTERIAL OVERGROWTH TEST;  Surgeon: Md Physician Gastroenterology, MD;  Location: [facility];  Service: Gastroenterology;  Laterality: N/A;  . EXPLORATORY LAPAROTOMY W/ BOWEL  RESECTION  2003  . LITHOTRIPSY  1980  . PROSTATECTOMY    . TONSILLECTOMY    . VENTRAL HERNIA REPAIR     2 times       Inpatient Medications: Scheduled Meds: . allopurinol  200 mg Oral q morning - 10a  . aspirin EC  81 mg Oral Daily  . atorvastatin  40 mg Oral q1800  . cholecalciferol  4,000 Units Oral q morning - 10a  . enoxaparin (LOVENOX) injection  40 mg Subcutaneous Q24H  . losartan  100 mg Oral Daily  . omega-3 acid ethyl esters  2 capsule Oral BID  . potassium chloride SA  20 mEq Oral Daily  . sodium chloride flush  3 mL Intravenous Q12H   Continuous Infusions: . sodium chloride     PRN Meds: sodium chloride, acetaminophen, albuterol, ALPRAZolam, HYDROmorphone, nitroGLYCERIN, ondansetron (ZOFRAN) IV, sodium chloride flush, zolpidem  Allergies:    Allergies  Allergen Reactions  . Colchicine Diarrhea, Nausea And Vomiting and Other (See Comments)    REACTION: nausea, diarrhea and interactions with 28m    Social History:   Social History   Socioeconomic History  . Marital status: Married    Spouse name: Not on file  . Number of children: 1  . Years of education: Not on file  . Highest education level: Not on file  Social Needs  . Financial resource strain: Not on file  . Food insecurity - worry:  Not on file  . Food insecurity - inability: Not on file  . Transportation needs - medical: Not on file  . Transportation needs - non-medical: Not on file  Occupational History  . Occupation: Retired     Comment: Event organiser  Tobacco Use  . Smoking status: Former Smoker    Packs/day: 1.00    Years: 38.00    Pack years: 38.00    Types: Cigarettes    Last attempt to quit: 03/19/1991    Years since quitting: 26.7  . Smokeless tobacco: Current User    Types: Chew  Substance and Sexual Activity  . Alcohol use: No  . Drug use: No  . Sexual activity: No  Other Topics Concern  . Not on file  Social History Narrative  . Not on file    Family History:     Family History  Problem Relation Age of Onset  . Heart disease Mother   . Cervical cancer Mother   . Heart disease Father   . Heart failure Father   . Dislocations Daughter 8       Bilateral knee  . Heart disease Maternal Uncle   . Colon cancer Neg Hx      ROS:  Please see the history of present illness.   All other ROS reviewed and negative.     Physical Exam/Data:   Vitals:   12/19/17 2230 12/19/17 2300 12/19/17 2356 12/20/17 0500  BP: (!) 154/87 128/78 (!) 154/83 125/83  Pulse: 74 75 83 85  Resp: 18 17 17 17   Temp:   98 F (36.7 C) 98 F (36.7 C)  TempSrc:   Oral Oral  SpO2: 97% 97% 98% 98%    Intake/Output Summary (Last 24 hours) at 12/20/2017 0843 Last data filed at 12/20/2017 0700 Gross per 24 hour  Intake 0 ml  Output -  Net 0 ml   There were no vitals filed for this visit. There is no height or weight on file to calculate BMI.  General:  Well nourished, well developed, in no acute distress HEENT: normal Lymph: no adenopathy Neck: no JVD Endocrine:  No thryomegaly Cardiac:  normal S1, S2; RRR; no murmur Lungs:  clear to auscultation bilaterally, no wheezing, rhonchi or rales  Abd: soft, nontender, no hepatomegaly  Ext: no edema Musculoskeletal:  No deformities, BUE and BLE strength normal and equal Skin: warm and dry  Neuro:  CNs 2-12 intact, no focal abnormalities noted Psych:  Normal affect       Laboratory Data:  Chemistry Recent Labs  Lab 12/19/17 1611 12/20/17 0657  NA 141 139  K 3.2* 3.4*  CL 108 107  CO2 23 24  GLUCOSE 103* 92  BUN 14 15  CREATININE 1.11 1.08  CALCIUM 9.4 9.0  GFRNONAA >60 >60  GFRAA >60 >60  ANIONGAP 10 8    No results for input(s): PROT, ALBUMIN, AST, ALT, ALKPHOS, BILITOT in the last 168 hours. Hematology Recent Labs  Lab 12/19/17 1611 12/20/17 0657  WBC 8.1 7.8  RBC 5.15 5.11  HGB 16.6 16.1  HCT 49.0 48.4  MCV 95.1 94.7  MCH 32.2 31.5  MCHC 33.9 33.3  RDW 14.5 14.5  PLT 147* 136*   Cardiac  Enzymes Recent Labs  Lab 12/19/17 1611 12/19/17 1935 12/20/17 0133 12/20/17 0657  TROPONINI 0.03* 0.19* 0.25* 0.15*   No results for input(s): TROPIPOC in the last 168 hours.  BNPNo results for input(s): BNP, PROBNP in the last 168 hours.  DDimer  Recent  Labs  Lab 12/19/17 1634  DDIMER <0.27    Radiology/Studies:  Dg Chest Portable 1 View  Result Date: 12/19/2017 CLINICAL DATA:  Intermittent chest tightness and shortness of breath for 2 weeks worse today, former smoker, history prostate cancer, hypertension EXAM: PORTABLE CHEST 1 VIEW COMPARISON:  Portable exam 1622 hours compared to 10/24/2017 FINDINGS: Normal heart size, mediastinal contours, and pulmonary vascularity. Atherosclerotic calcification aorta. Chronic elevation of RIGHT diaphragm with minimal RIGHT basilar atelectasis. No acute infiltrate, pleural effusion, or pneumothorax. Bones unremarkable. IMPRESSION: Minimal RIGHT basilar atelectasis. No acute infiltrate. Electronically Signed   By: Lavonia Dana M.D.   On: 12/19/2017 16:31    Assessment and Plan:   1. Tachycardia - from notes patient initially with rates 160s by EMS. Unfortunately the strips cannot be located at this time after reviewing epic, paper chart, and discussing with ER. From the description it was a narrow complex regular tachycardia at 160 - K mildly low at 3.2, Mg 1.7. Normal TSH. Would keep K at 4 and Mg at 2 - start lopressor 39m bid.  - we will arrange a 21 day event monitor at home  2. Elevated troponin - likely related to SVT and severe tachycardia. Trop has peaked as 0.25 and trending down, EKG without acute ischemic changes. D-dimer negative - echo pending. F/u results, if normal echo would not pursue ischemic testing, elevated troponin likely secondary to severe tachycardia.   3. Hypokalemia - I have written for 454m of KCl today.   For questions or updates, please contact [facility] consult [email or web address] for contact info under  Cardiology/STEMI.   SiMerrily PewMD  12/20/2017 8:43 AM

## 2017-12-20 NOTE — Progress Notes (Signed)
*  PRELIMINARY RESULTS* Echocardiogram 2D Echocardiogram has been performed.  Luis Deleon 12/20/2017, 12:13 PM

## 2017-12-20 NOTE — Progress Notes (Signed)
**Note De-Identified  Obfuscation** EKG complete and placed in patient chart

## 2017-12-20 NOTE — Progress Notes (Signed)
Discharge instructions gone over with patient, verbalized understanding. IV removed, patient tolerated procedure well. Follow up appointment with PCP made for patient.

## 2017-12-23 ENCOUNTER — Telehealth: Payer: Self-pay | Admitting: Cardiology

## 2017-12-23 IMAGING — US US ABDOMEN COMPLETE
1 series · 13 of 25 positions shown · non-contrast
Comparison: MRI of the abdomen of June 20, 2016 and abdominal
ultrasound February 16, 2016.

CLINICAL DATA: Polycythemia Flete. History of fatty liver disease,
hepatic cysts, kidney stones, prostatic malignancy.

EXAM:
ABDOMEN ULTRASOUND COMPLETE

[Series 1: us abdomen complete · 0.19mm/px · 13 of 162 slices shown]
[im 1/162]
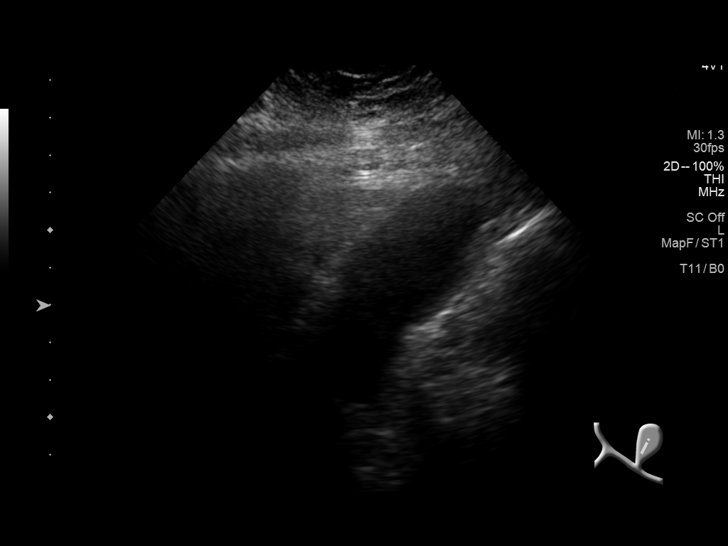
[im 14/162]
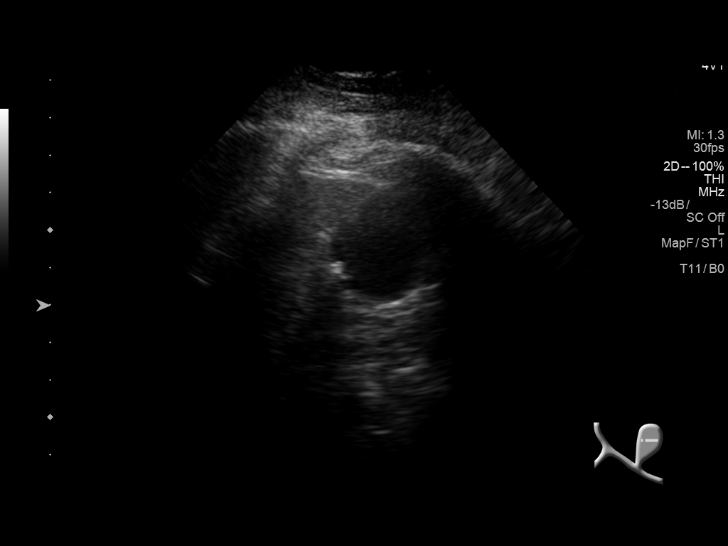
[im 27/162]
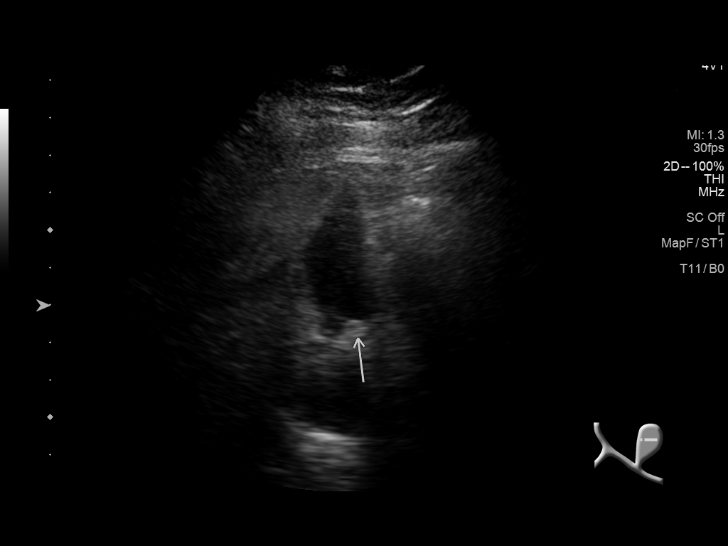
[im 41/162]
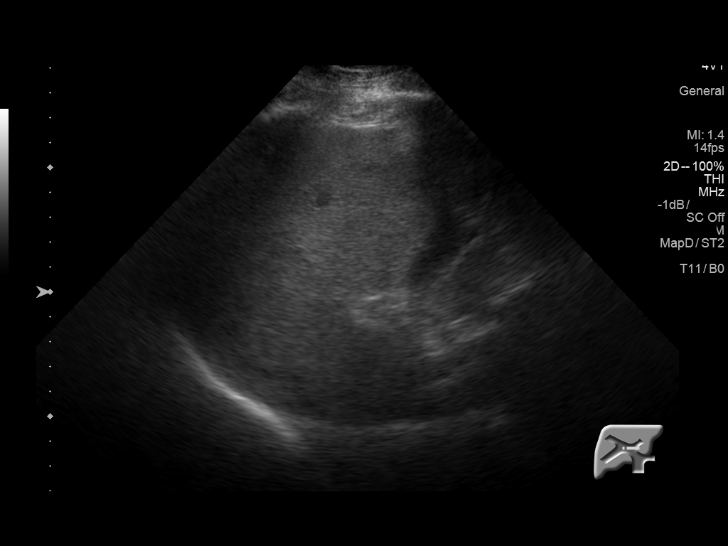
[im 54/162]
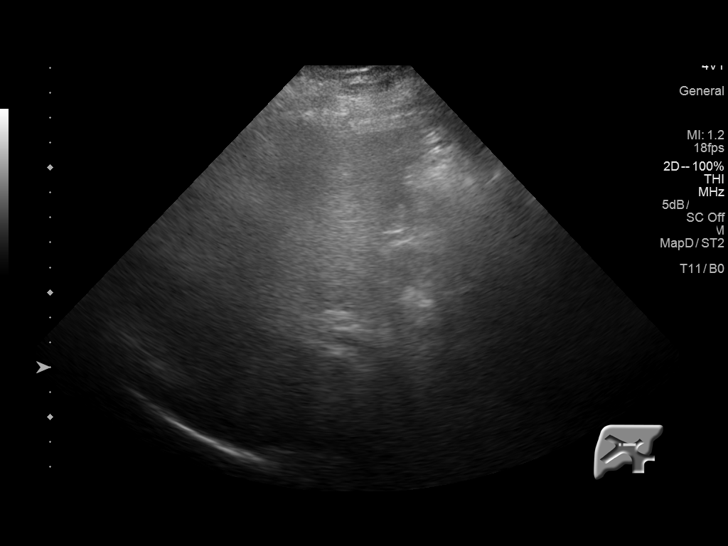
[im 68/162]
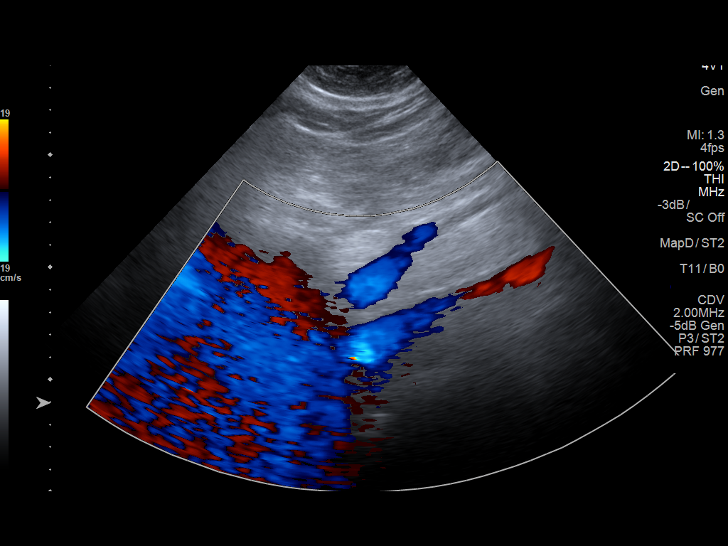
[im 81/162]
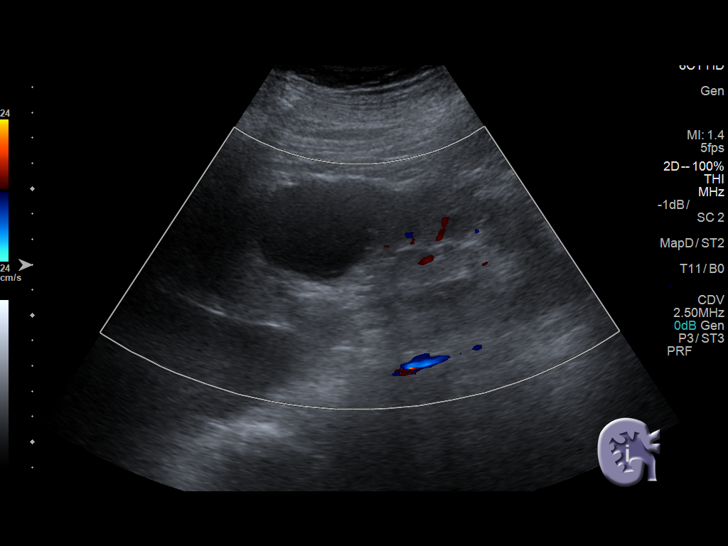
[im 94/162]
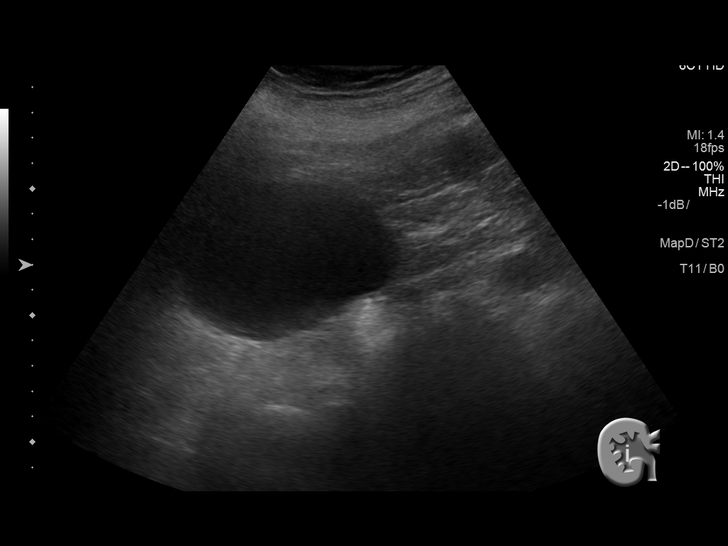
[im 108/162]
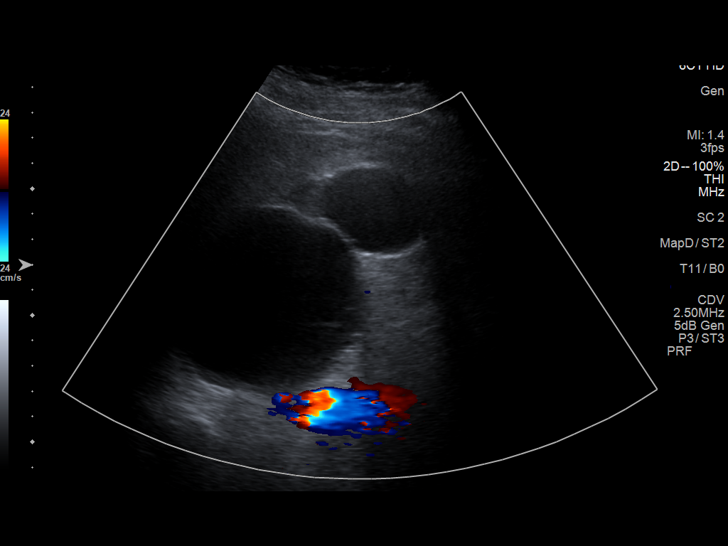
[im 121/162]
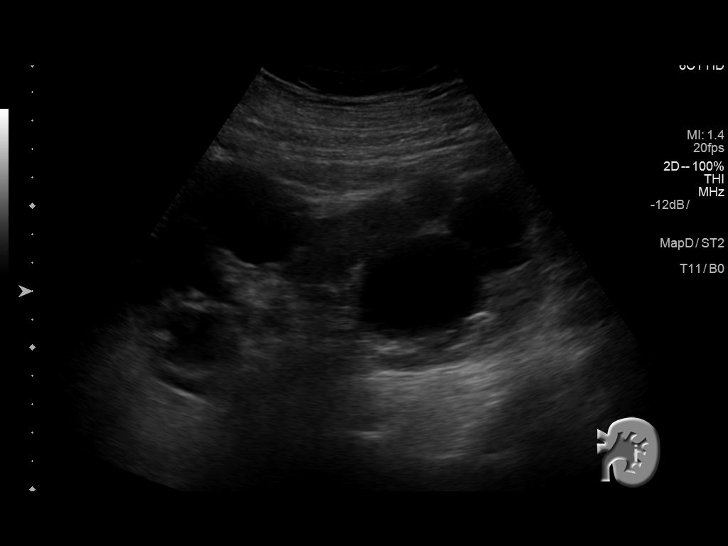
[im 135/162]
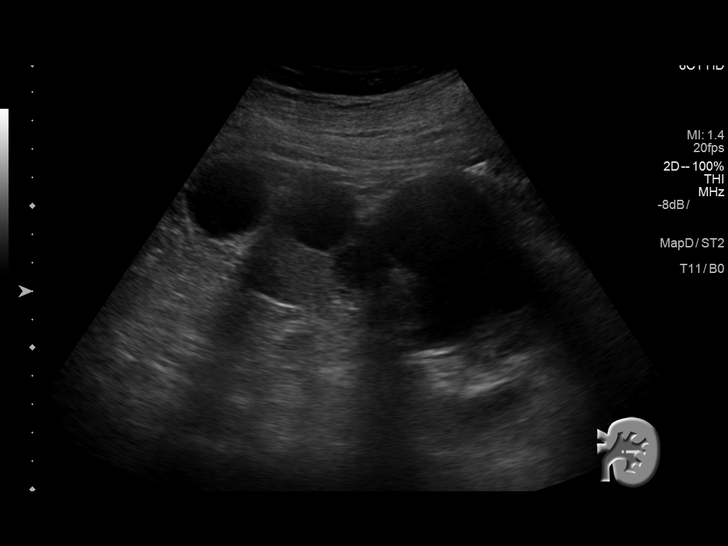
[im 148/162]
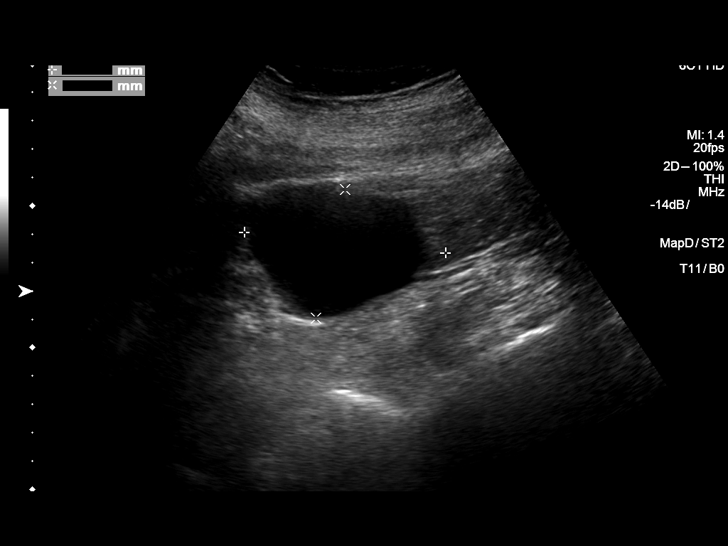
[im 162/162]
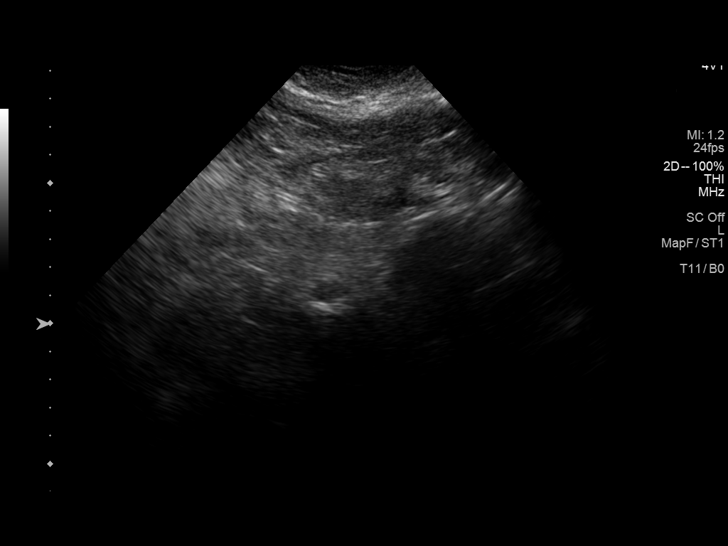

[13 of 25 positions shown; findings below may reference images not displayed]

FINDINGS: Gallbladder: The gallbladder is adequately distended. There is an
echogenic shadowing focus measuring just under 7 mm in diameter
compatible with a stone. There is no gallbladder wall thickening,
pericholecystic fluid, or positive sonographic Murphy's sign.

Common bile duct: Diameter: 3.2 mm

Liver: Penetration of the organ was limited by the increased
echotexture diffusely. The surface contour of the liver appears
smooth. No intrahepatic ductal dilation or discrete mass is
observed. The previously described hepatic cysts are not clearly new
evident on this study. Portal vein is patent on color Doppler
imaging with normal direction of blood flow towards the liver.

IVC: No abnormality visualized.

Pancreas: Visualized portion unremarkable.

Spleen: The spleen is normal in echotexture and size measuring
cm in length.

Right Kidney: Length: 17.6 cm.. The renal cortical echotexture is
slightly lower than that of the liver. Multiple cysts are present.
The largest measures 9.6 x 6.5 by 9.1 cm and lies in the lower pole.
There is a similar size cyst in the upper pole measuring 7.4 x 7.8 x
7.8 cm. Multiple smaller cysts are visible. There is no
hydronephrosis.

Left Kidney: Length: 16.8 cm. The renal cortical echotexture is
similar to that on the right. Multiple cysts are observed. The
largest lies in the upper pole and measures 8.0 x 7.6 x 0.4 cm. A
second cyst in the lower pole measures 7.1 x 4.7 x 6.5 cm. Multiple
smaller cysts are present. There is no hydronephrosis.

Abdominal aorta: There is no aneurysm.  There is mural plaque.

Other findings: None.
IMPRESSION: Gallstones without sonographic evidence of acute cholecystitis.

Increased hepatic echotexture compatible with fatty infiltrative
change. Limited visualization of the hepatic parenchyma. The
previously described hepatic cysts are not evident on today's study.

Multiple large bilateral renal cysts.  No hydronephrosis.

Atherosclerotic mural plaque in the aorta without evidence of
aneurysm.

## 2017-12-23 NOTE — Telephone Encounter (Signed)
Patient states he was to have event monitor and f/u post hosp [facility]. Can you please check on this and advise. / tg

## 2017-12-24 NOTE — Telephone Encounter (Signed)
Pt needs an appt.to see MD. Then will decide if monitor is needed. Terry aware to schedule appt.

## 2017-12-25 ENCOUNTER — Encounter: Payer: Self-pay | Admitting: Family Medicine

## 2017-12-25 ENCOUNTER — Ambulatory Visit (INDEPENDENT_AMBULATORY_CARE_PROVIDER_SITE_OTHER): Payer: Medicare HMO | Admitting: Family Medicine

## 2017-12-25 VITALS — BP 113/66 | HR 72 | Temp 97.8°F | Ht 67.5 in | Wt 246.0 lb

## 2017-12-25 DIAGNOSIS — Z09 Encounter for follow-up examination after completed treatment for conditions other than malignant neoplasm: Secondary | ICD-10-CM | POA: Diagnosis not present

## 2017-12-25 DIAGNOSIS — R0789 Other chest pain: Secondary | ICD-10-CM | POA: Diagnosis not present

## 2017-12-25 DIAGNOSIS — E876 Hypokalemia: Secondary | ICD-10-CM | POA: Diagnosis not present

## 2017-12-25 DIAGNOSIS — R002 Palpitations: Secondary | ICD-10-CM | POA: Diagnosis not present

## 2017-12-25 NOTE — Addendum Note (Signed)
Addended by: Zannie Cove on: 12/25/2017 02:21 PM   Modules accepted: Orders

## 2017-12-25 NOTE — Patient Instructions (Signed)
Follow-up with cardiology as planned and get a event monitor as requested We will call with results of potassium as soon as those results become available Continue with medications that were started on in the hospital pending the results of this lab work and visit to the cardiologist.

## 2017-12-25 NOTE — Progress Notes (Signed)
Subjective:    Patient ID: Luis Deleon, male    DOB: Aug 14, 1941, 77 y.o.   MRN: [id number]  HPI  Patient here today for hospital follow up from [facility]. He was seen for chest pain and palpitations. He was admitted on 12/19/17 and was discharged on 12/20/17.  The patient comes today for a hospital follow-up visit.  He developed chest pain and was seen in the emergency room and seen by the cardiologist because of heart rate.  Initial troponin levels were slightly elevated but it was felt these were due to the rapid heart rate.  He was placed on metoprolol 25 mg twice daily and potassium 20 mEq twice daily.  Heart rate got up to 160.  He was also told that he needed to follow-up with the cardiologist for rhythm evaluation studies or possible Holter monitor.  Today he does complain of some dizziness.  This is only at times.  The blood pressure today was 113/66.  Typically it is lower than this slightly for the systolic number.  It was requested by the discharge physician from the hospital that the patient follow-up with metabolic panel to check his electrolytes and renal function.  Also the need was there to reassess the blood pressure with adjustment of antihypertensive medicine if needed.  We also want to make sure that he has a follow-up appointment with cardiology for event monitoring.  The patient's discharge potassium from the hospital was 3.4.  He did have a chest x-ray which showed minimal right basilar atelectasis with no acute infiltrate.  The hemoglobin was good at 16.1 with a normal white blood cell count.  Patient is pleasant and alert following his recent visit to the emergency room and the history of what happened was reviewed with him again during the visit today.  He does still have some occasional dizzy spells.  This is most likely the reason for doing the event monitor and I explained to him while they are willing to do one that takes a longer period of time than just 24 hours.  Since he  has been at home he denies any chest pain or shortness of breath.  He continues to have loose stools as in the past but no worse than usual.  He is taking the metoprolol twice a day and the potassium supplement twice daily.  He denies any blood in the stool or black tarry bowel movements and is passing his water without problems.    Patient Active Problem List   Diagnosis Date Noted  . Hypokalemia 12/19/2017  . Tachyarrhythmia 12/19/2017  . Chest pain 12/19/2017  . 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 Abdominal aortic atherosclerosis ([place]) 11/25/2014  . Metabolic syndrome 62/70/3500  . DOE (dyspnea on exertion) 08/11/2013  . 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 POLYCYTHEMIA 11/03/2008  . LIVER FUNCTION TESTS, ABNORMAL, HX OF 09/24/2008  . Hyperlipidemia 07/28/2008  . GOUT, UNSPECIFIED 07/28/2008  . HTN (hypertension) 07/28/2008  . Crohn disease ([place]) 07/28/2008  . DIVERTICULOSIS, COLON 07/28/2008  . Prostate cancer ([place]) 07/28/2008  . SMALL BOWEL OBSTRUCTION, HX OF 07/28/2008  . NEPHROLITHIASIS, HX OF 07/28/2008   Outpatient Encounter Medications as of 12/25/2017  Medication Sig  . albuterol (PROVENTIL HFA;VENTOLIN HFA) 108 (90 Base) MCG/ACT inhaler Inhale 2 puffs into the lungs every 6 (six) hours as needed for wheezing or shortness of breath.  . allopurinol (ZYLOPRIM) 100 MG tablet TAKE 2 TABLETS DAILY AS DIRECTED (Patient taking differently: Take 200 mg by mouth every morning. TAKE  2 TABLETS DAILY AS DIRECTE)  . aspirin 81 MG EC tablet Take 81 mg by mouth daily.    Marland Kitchen atorvastatin (LIPITOR) 40 MG tablet TAKE 1 TABLET EVERY DAY AS DIRECTED  . Cholecalciferol (VITAMIN D3) 1000 UNITS CAPS Take 4 capsules by mouth every morning.   . fluticasone (FLONASE) 50 MCG/ACT nasal spray Place 2 sprays into both nostrils daily. (Patient taking differently: Place 2 sprays into both nostrils at bedtime. )  . gemfibrozil (LOPID) 600 MG tablet Take 1 tablet (600 mg total) by mouth daily.  (Patient taking differently: Take 600 mg by mouth every Monday, Wednesday, and Friday. )  . hydrochlorothiazide (MICROZIDE) 12.5 MG capsule TAKE 1 TABLET (12.5 MG TOTAL) BY MOUTH DAILY. AS DIRECTED  . HYDROmorphone (DILAUDID) 2 MG tablet Take 1-2 capsules by mouth every 3 hours as needed (Patient taking differently: 2-4 mg every 3 (three) hours as needed for moderate pain or severe pain (severe abdominal pain). )  . losartan (COZAAR) 100 MG tablet TAKE 1/2 TABLET BY MOUTH EVERY DAY AS DIRECTED  . metoprolol tartrate (LOPRESSOR) 25 MG tablet Take 1 tablet (25 mg total) by mouth 2 (two) times daily.  Marland Kitchen nystatin-triamcinolone (MYCOLOG II) cream Apply 1 application topically 2 (two) times daily. (Patient taking differently: Apply 1 application as needed topically. )  . OMEGA-3 1000 MG CAPS Take 2 capsules by mouth 2 (two) times daily.  . potassium chloride SA (KLOR-CON M20) 20 MEQ tablet Take 2 tablets (40 mEq total) by mouth daily.   Facility-Administered Encounter Medications as of 12/25/2017  Medication  . cyanocobalamin ((VITAMIN B-12)) injection 1,000 mcg     Review of Systems  Constitutional: Negative.   HENT: Negative.   Eyes: Negative.   Respiratory: Negative.   Cardiovascular: Negative.  Negative for palpitations.  Gastrointestinal: Negative.   Endocrine: Negative.   Genitourinary: Negative.   Musculoskeletal: Negative.   Skin: Negative.   Allergic/Immunologic: Negative.   Neurological: Positive for dizziness (at times).  Hematological: Negative.   Psychiatric/Behavioral: Negative.        Objective:   Physical Exam  Constitutional: He is oriented to person, place, and time. He appears well-developed and well-nourished. No distress.  The patient is pleasant and alert today and understands what he needs to do for follow-up from this hospital visit.  HENT:  Head: Normocephalic and atraumatic.  Eyes: Conjunctivae and EOM are normal. Pupils are equal, round, and reactive to  light. Right eye exhibits no discharge. Left eye exhibits no discharge. No scleral icterus.  Neck: Normal range of motion. Neck supple. No thyromegaly present.  Cardiovascular: Normal rate, regular rhythm and normal heart sounds.  No murmur heard. The heart is regular at 72/min  Pulmonary/Chest: Effort normal and breath sounds normal. No respiratory distress. He has no wheezes. He has no rales.  Clear anteriorly and posteriorly   Abdominal: Soft. Bowel sounds are normal. He exhibits no mass. There is no tenderness. There is no rebound and no guarding.  No tenderness masses or bruits.  Genitourinary: Rectum normal.  Musculoskeletal: Normal range of motion. He exhibits no edema.  Lymphadenopathy:    He has no cervical adenopathy.  Neurological: He is alert and oriented to person, place, and time.  Skin: Skin is warm and dry. No rash noted.  Psychiatric: He has a normal mood and affect. His behavior is normal. Judgment and thought content normal.  Nursing note and vitals reviewed.  BP 113/66 (BP Location: Left Arm)   Pulse 72   Temp 97.8 F (  36.6 C) (Oral)   Ht 5' 7.5" (1.715 m)   Wt 246 lb (111.6 kg)   BMI 37.96 kg/m         Assessment & Plan:  1. Hospital discharge follow-up -Follow-up with cardiology as planned and get the event monitor -Continue with current potassium replacement as well as with metoprolol 25 twice daily that was started in the hospital.  2. Decreased potassium in the blood - BMP8+EGFR  Patient Instructions  Follow-up with cardiology as planned and get a event monitor as requested We will call with results of potassium as soon as those results become available Continue with medications that were started on in the hospital pending the results of this lab work and visit to the cardiologist.  Arrie Senate MD

## 2017-12-26 ENCOUNTER — Other Ambulatory Visit: Payer: Self-pay | Admitting: Family Medicine

## 2017-12-26 LAB — BMP8+EGFR
BUN/Creatinine Ratio: 11 (ref 10–24)
BUN: 13 mg/dL (ref 8–27)
CO2: 25 mmol/L (ref 20–29)
CREATININE: 1.23 mg/dL (ref 0.76–1.27)
Calcium: 9.7 mg/dL (ref 8.6–10.2)
Chloride: 107 mmol/L — ABNORMAL HIGH (ref 96–106)
GFR, EST AFRICAN AMERICAN: 66 mL/min/{1.73_m2} (ref >59)
GFR, EST NON AFRICAN AMERICAN: 57 mL/min/{1.73_m2} — AB (ref >59)
Glucose: 99 mg/dL (ref 65–99)
Potassium: 4 mmol/L (ref 3.5–5.2)
Sodium: 145 mmol/L — ABNORMAL HIGH (ref 134–144)

## 2017-12-28 ENCOUNTER — Other Ambulatory Visit: Payer: Self-pay | Admitting: Family Medicine

## 2018-01-01 ENCOUNTER — Telehealth: Payer: Self-pay | Admitting: Family Medicine

## 2018-01-01 NOTE — Telephone Encounter (Signed)
Pt aware that we will check into the scheduling of this appt.

## 2018-01-02 ENCOUNTER — Encounter: Payer: Self-pay | Admitting: Cardiovascular Disease

## 2018-01-02 ENCOUNTER — Ambulatory Visit: Payer: Medicare HMO | Admitting: Cardiovascular Disease

## 2018-01-02 VITALS — BP 126/74 | HR 72 | Ht 67.0 in | Wt 242.0 lb

## 2018-01-02 DIAGNOSIS — R0602 Shortness of breath: Secondary | ICD-10-CM

## 2018-01-02 DIAGNOSIS — R000 Tachycardia, unspecified: Secondary | ICD-10-CM

## 2018-01-02 DIAGNOSIS — R072 Precordial pain: Secondary | ICD-10-CM | POA: Diagnosis not present

## 2018-01-02 DIAGNOSIS — I471 Supraventricular tachycardia: Secondary | ICD-10-CM

## 2018-01-02 NOTE — Progress Notes (Signed)
01/02/2018 Luis Deleon   Dec 24, 1940  [id number]  Primary Physician Chipper Herb, MD Primary Cardiologist: Lorretta Harp MD Lupe Carney, [place]  HPI:  Luis Deleon is a 77 y.o. moderate to severely overweight married Caucasian male father of one,and father to 2 grandchildren who is retired from [employer]. He was a Engineer, structural in [place]. He was referred by Dr. Morrie Sheldon, his primary care physician, for cardiovascular evaluation because of a recent episode of PSVT. He has a history of treated hypertension and hyperlipidemia. He's never had a heart attack or stroke. He smoked remotely having stopped 25 years ago. He does have a history of Crohn's disease and prostate cancer status post radical prostatectomy remotely by Dr. Jeffie Pollock at which time he did have an episode of PSVT.he was admitted to [facility] on 12/19/17 with what appears to be PSVT which broke. His rate was 160. He had positive below troponins. A 2-D echo was normal. Ordinarily, he denies chest pain or shortness of breath.   Current Meds  Medication Sig  . allopurinol (ZYLOPRIM) 100 MG tablet TAKE 2 TABLETS DAILY AS DIRECTED (Patient taking differently: Take 200 mg by mouth every morning. TAKE 2 TABLETS DAILY AS DIRECTE)  . aspirin 81 MG EC tablet Take 81 mg by mouth daily.    Marland Kitchen atorvastatin (LIPITOR) 40 MG tablet TAKE 1 TABLET EVERY DAY AS DIRECTED  . Cholecalciferol (VITAMIN D3) 1000 UNITS CAPS Take 4 capsules by mouth every morning.   . fluticasone (FLONASE) 50 MCG/ACT nasal spray Place 2 sprays into both nostrils daily. (Patient taking differently: Place 2 sprays into both nostrils at bedtime. )  . gemfibrozil (LOPID) 600 MG tablet Take 1 tablet (600 mg total) by mouth daily. (Patient taking differently: Take 600 mg by mouth every Monday, Wednesday, and Friday. )  . hydrochlorothiazide (MICROZIDE) 12.5 MG capsule TAKE 1 TABLET (12.5 MG TOTAL) BY MOUTH DAILY. AS DIRECTED  . HYDROmorphone (DILAUDID)  2 MG tablet Take 1-2 capsules by mouth every 3 hours as needed (Patient taking differently: 2-4 mg every 3 (three) hours as needed for moderate pain or severe pain (severe abdominal pain). )  . losartan (COZAAR) 100 MG tablet TAKE 1/2 TABLET BY MOUTH EVERY DAY AS DIRECTED  . metoprolol tartrate (LOPRESSOR) 25 MG tablet Take 1 tablet (25 mg total) by mouth 2 (two) times daily.  Marland Kitchen nystatin-triamcinolone (MYCOLOG II) cream Apply 1 application topically 2 (two) times daily. (Patient taking differently: Apply 1 application as needed topically. )  . OMEGA-3 1000 MG CAPS Take 2 capsules by mouth 2 (two) times daily.  . potassium chloride SA (KLOR-CON M20) 20 MEQ tablet Take 2 tablets (40 mEq total) by mouth daily.   Current Facility-Administered Medications for the 01/02/18 encounter (Office Visit) with Lorretta Harp, MD  Medication  . cyanocobalamin ((VITAMIN B-12)) injection 1,000 mcg     Allergies  Allergen Reactions  . Colchicine Diarrhea, Nausea And Vomiting and Other (See Comments)    REACTION: nausea, diarrhea and interactions with 87m    Social History   Socioeconomic History  . Marital status: Married    Spouse name: Not on file  . Number of children: 1  . Years of education: Not on file  . Highest education level: Not on file  Occupational History  . 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 Financial resource strain: Not on file  . Food insecurity:  Worry: Not on file    Inability: Not on file  . Transportation needs:    Medical: Not on file    Non-medical: Not on file  Tobacco Use  . Smoking status: Former Smoker    Packs/day: 1.00    Years: 38.00    Pack years: 38.00    Types: Cigarettes    Last attempt to quit: 03/19/1991    Years since quitting: 26.8  . Smokeless tobacco: Current User    Types: Chew  Substance and Sexual Activity  . Alcohol use: No  . Drug use: No  . Sexual activity: Never  Lifestyle  . Physical activity:    Days  per week: Not on file    Minutes per session: Not on file  . Stress: Not on file  Relationships  . Social connections:    Talks on phone: Not on file    Gets together: Not on file    Attends religious service: Not on file    Active member of club or organization: Not on file    Attends meetings of clubs or organizations: Not on file    Relationship status: Not on file  . Intimate partner violence:    Fear of current or ex partner: Not on file    Emotionally abused: Not on file    Physically abused: Not on file    Forced sexual activity: Not on file  Other Topics Concern  . Not on file  Social History Narrative  . Not on file     Review of Systems: General: negative for chills, fever, night sweats or weight changes.  Cardiovascular: negative for chest pain, dyspnea on exertion, edema, orthopnea, palpitations, paroxysmal nocturnal dyspnea or shortness of breath Dermatological: negative for rash Respiratory: negative for cough or wheezing Urologic: negative for hematuria Abdominal: negative for nausea, vomiting, diarrhea, bright red blood per rectum, melena, or hematemesis Neurologic: negative for visual changes, syncope, or dizziness All other systems reviewed and are otherwise negative except as noted above.    Blood pressure 126/74, pulse 72, height 5' 7"  (1.702 m), weight 242 lb (109.8 kg).  General appearance: alert and no distress Neck: no adenopathy, no carotid bruit, no JVD, supple, symmetrical, trachea midline and thyroid not enlarged, symmetric, no tenderness/mass/nodules Lungs: clear to auscultation bilaterally Heart: regular rate and rhythm, S1, S2 normal, no murmur, click, rub or gallop Extremities: extremities normal, atraumatic, no cyanosis or edema Pulses: 2+ and symmetric Skin: Skin color, texture, turgor normal. No rashes or lesions Neurologic: Alert and oriented X 3, normal strength and tone. Normal symmetric reflexes. Normal coordination and gait  EKG Not  performed today  ASSESSMENT AND PLAN:   Hyperlipidemia History of hyperlipidemia on gemfibrozil and atorvastatin with lipid profile performed 10/24/17 revealing an LDL 64 and HDL of 37.  HTN (hypertension) History of essential hypertension blood pressure measured at 126/74. He is on hydrochlorothiazide, losartan and metoprolol. Continue current meds at current dosing  Tachyarrhythmia Patient was hospitalized with tachyarrhythmia thought to be PSVT. History this was associated with chest pain and shortness of breath. Symptoms resolved when his rhythm returned to normal sinus. His troponins were low but positive. Assessment 2-D echo was completely normal. She noted that he was mildly hypokalemic with a potassium of 3.4 which was repleted. I'm going to get a 30 day event monitor and a pharmacologic Myoview stress test to further evaluate.      Lorretta Harp MD FACP,FACC,FAHA, [facility] 01/02/2018 11:03 AM

## 2018-01-02 NOTE — Patient Instructions (Signed)
Medication Instructions: Your physician recommends that you continue on your current medications as directed. Please refer to the Current Medication list given to you today.   Testing/Procedures: Your physician has recommended that you wear a 30 day event monitor. Event monitors are medical devices that record the heart's electrical activity. Doctors most often [place] these monitors to diagnose arrhythmias. Arrhythmias are problems with the speed or rhythm of the heartbeat. The monitor is a small, portable device. You can wear one while you do your normal daily activities. This is usually used to diagnose what is causing palpitations/syncope (passing out).  Your physician has requested that you have a lexiscan myoview. For further information please visit [email or web address]. Please follow instruction sheet, as given.  Follow-Up: Your physician wants you to follow-up in: 1 year with Dr. Gwenlyn Found. You will receive a reminder letter in the mail two months in advance. If you don't receive a letter, please call our office to schedule the follow-up appointment.  If you need a refill on your cardiac medications before your next appointment, please call your pharmacy.

## 2018-01-02 NOTE — Assessment & Plan Note (Signed)
Patient was hospitalized with tachyarrhythmia thought to be PSVT. History this was associated with chest pain and shortness of breath. Symptoms resolved when his rhythm returned to normal sinus. His troponins were low but positive. Assessment 2-D echo was completely normal. She noted that he was mildly hypokalemic with a potassium of 3.4 which was repleted. I'm going to get a 30 day event monitor and a pharmacologic Myoview stress test to further evaluate.

## 2018-01-02 NOTE — Assessment & Plan Note (Signed)
History of essential hypertension blood pressure measured at 126/74. He is on hydrochlorothiazide, losartan and metoprolol. Continue current meds at current dosing

## 2018-01-02 NOTE — Assessment & Plan Note (Signed)
History of hyperlipidemia on gemfibrozil and atorvastatin with lipid profile performed 10/24/17 revealing an LDL 64 and HDL of 37.

## 2018-01-07 ENCOUNTER — Other Ambulatory Visit: Payer: Self-pay | Admitting: *Deleted

## 2018-01-07 ENCOUNTER — Ambulatory Visit (INDEPENDENT_AMBULATORY_CARE_PROVIDER_SITE_OTHER): Payer: Medicare HMO | Admitting: *Deleted

## 2018-01-07 DIAGNOSIS — E538 Deficiency of other specified B group vitamins: Secondary | ICD-10-CM

## 2018-01-07 MED ORDER — POTASSIUM CHLORIDE CRYS ER 20 MEQ PO TBCR: 40.0000 meq | EXTENDED_RELEASE_TABLET | Freq: Every day | ORAL | 3 refills | Status: DC

## 2018-01-07 MED ORDER — METOPROLOL TARTRATE 25 MG PO TABS: 25.0000 mg | ORAL_TABLET | Freq: Two times a day (BID) | ORAL | 3 refills | Status: DC

## 2018-01-07 NOTE — Progress Notes (Signed)
Pt given cyanocobalamin inj Tolerated wll

## 2018-01-08 ENCOUNTER — Telehealth (HOSPITAL_COMMUNITY): Payer: Self-pay

## 2018-01-08 NOTE — Telephone Encounter (Signed)
Encounter complete. 

## 2018-01-09 ENCOUNTER — Emergency Department (HOSPITAL_COMMUNITY): Payer: Medicare HMO

## 2018-01-09 ENCOUNTER — Emergency Department (HOSPITAL_COMMUNITY)
Admission: EM | Admit: 2018-01-09 | Discharge: 2018-01-09 | Disposition: A | Discharge status: Home / Self Care | Payer: Medicare HMO | Attending: Emergency Medicine | Admitting: Emergency Medicine

## 2018-01-09 ENCOUNTER — Other Ambulatory Visit: Payer: Self-pay

## 2018-01-09 ENCOUNTER — Encounter (HOSPITAL_COMMUNITY): Payer: Self-pay

## 2018-01-09 ENCOUNTER — Telehealth: Payer: Self-pay | Admitting: Cardiovascular Disease

## 2018-01-09 DIAGNOSIS — R079 Chest pain, unspecified: Secondary | ICD-10-CM

## 2018-01-09 DIAGNOSIS — Z79899 Other long term (current) drug therapy: Secondary | ICD-10-CM | POA: Diagnosis not present

## 2018-01-09 DIAGNOSIS — Z8546 Personal history of malignant neoplasm of prostate: Secondary | ICD-10-CM | POA: Insufficient documentation

## 2018-01-09 DIAGNOSIS — R0602 Shortness of breath: Secondary | ICD-10-CM | POA: Diagnosis not present

## 2018-01-09 DIAGNOSIS — I10 Essential (primary) hypertension: Secondary | ICD-10-CM | POA: Diagnosis not present

## 2018-01-09 DIAGNOSIS — R0789 Other chest pain: Secondary | ICD-10-CM | POA: Insufficient documentation

## 2018-01-09 DIAGNOSIS — Z87891 Personal history of nicotine dependence: Secondary | ICD-10-CM | POA: Insufficient documentation

## 2018-01-09 DIAGNOSIS — R002 Palpitations: Secondary | ICD-10-CM | POA: Diagnosis not present

## 2018-01-09 DIAGNOSIS — Z7982 Long term (current) use of aspirin: Secondary | ICD-10-CM | POA: Diagnosis not present

## 2018-01-09 LAB — BASIC METABOLIC PANEL
ANION GAP: 7 (ref 5–15)
BUN: 16 mg/dL (ref 6–20)
CALCIUM: 9.3 mg/dL (ref 8.9–10.3)
CO2: 24 mmol/L (ref 22–32)
Chloride: 112 mmol/L — ABNORMAL HIGH (ref 101–111)
Creatinine, Ser: 1.17 mg/dL (ref 0.61–1.24)
GFR, EST NON AFRICAN AMERICAN: 60 mL/min — AB (ref >60)
Glucose, Bld: 103 mg/dL — ABNORMAL HIGH (ref 65–99)
Potassium: 4.4 mmol/L (ref 3.5–5.1)
SODIUM: 143 mmol/L (ref 135–145)

## 2018-01-09 LAB — CBC
HCT: 51.6 % (ref 39.0–52.0)
HEMOGLOBIN: 17.8 g/dL — AB (ref 13.0–17.0)
MCH: 32.8 pg (ref 26.0–34.0)
MCHC: 34.5 g/dL (ref 30.0–36.0)
MCV: 95.2 fL (ref 78.0–100.0)
Platelets: 150 10*3/uL (ref 150–400)
RBC: 5.42 MIL/uL (ref 4.22–5.81)
RDW: 14.6 % (ref 11.5–15.5)
WBC: 8.3 10*3/uL (ref 4.0–10.5)

## 2018-01-09 LAB — I-STAT TROPONIN, ED
TROPONIN I, POC: 0.04 ng/mL (ref 0.00–0.08)
Troponin i, poc: 0.06 ng/mL (ref 0.00–0.08)

## 2018-01-09 MED ORDER — METOPROLOL TARTRATE 50 MG PO TABS: 50.0000 mg | ORAL_TABLET | Freq: Two times a day (BID) | ORAL | 0 refills | Status: DC

## 2018-01-09 NOTE — ED Notes (Signed)
Pt  Is alert and orinted x 4 and is verbally responsive. Pt denies chest pain at this time. Pt dtr is at bedside,

## 2018-01-09 NOTE — Telephone Encounter (Signed)
Luis Deleon, Patient daughter calling states that patient went to [facility] ED this morning. Patient had another episode of SOB, chest tightness, HR racing. Patient wanted to make Dr. Gwenlyn Found aware and will see about cancelling stress test.

## 2018-01-09 NOTE — ED Provider Notes (Signed)
[facility] DEPT Provider Note   CSN: [id number] Arrival date & time: 01/09/18  1035     History   Chief Complaint Chief Complaint  Patient presents with  . Chest Pain  . Shortness of Breath    HPI Luis Deleon is a 77 y.o. male.  HPI Patient is a 77 year old male presents the emergency department with acute onset chest discomfort today at 830 with associated shortness of breath and palpitations.  He had a similar episode last week at which point he was hospitalized at [facility] in [place].  He underwent serial troponins at that time was found to have a mild elevation his troponin to 0.19.  He is scheduled to see cardiology tomorrow for a stress Myoview.  Last week he had resolution of his discomfort with resolution of his tachyarrhythmia which was felt to be either SVT or atrial flutter with 2-1 block.  Patient had converted to sinus rhythm in the ER last week.  Today he began having similar symptoms and called his daughter was brought to the emergency department.  On arrival to emergency department his symptoms have resolved his EKG shows sinus rhythm.  He feels much better at this time.  And is asymptomatic.  His recent tachyarrhythmias and chest discomfort have concerned him although he is never had syncope or near syncope associated with them.  He does get chest tightness associated with them.  No prior history of cardiac disease.   Past Medical History:  Diagnosis Date  . Abnormal LFTs (liver function tests)   . Arthritis   . Crohn disease ([place])   . Diverticulosis   . Fatty liver   . Gout   . Hepatic cyst   . Hyperlipemia   . Hyperplastic colon polyp 2012  . Hypertension   . Nephrolithiasis   . Polycythemia   . Prostate cancer ([place])    8 or 9 years  . Small bowel obstruction ([place]) 2003  . Vitamin D deficiency     Patient Active Problem List   Diagnosis Date Noted  . Hypokalemia 12/19/2017  .  Tachyarrhythmia 12/19/2017  . Chest pain 12/19/2017  . Thrombocytopenia ([place]) 04/05/2015  . 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 Metabolic syndrome 18/29/9371  . DOE (dyspnea on exertion) 08/11/2013  . 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 POLYCYTHEMIA 11/03/2008  . LIVER FUNCTION TESTS, ABNORMAL, HX OF 09/24/2008  . Hyperlipidemia 07/28/2008  . GOUT, UNSPECIFIED 07/28/2008  . HTN (hypertension) 07/28/2008  . Crohn disease ([place]) 07/28/2008  . DIVERTICULOSIS, COLON 07/28/2008  . Prostate cancer ([place]) 07/28/2008  . SMALL BOWEL OBSTRUCTION, HX OF 07/28/2008  . NEPHROLITHIASIS, HX OF 07/28/2008    Past Surgical History:  Procedure Laterality Date  . APPENDECTOMY  2003  . BACTERIAL OVERGROWTH TEST N/A 06/15/2016   Procedure: BACTERIAL OVERGROWTH TEST;  Surgeon: Md Physician Gastroenterology, MD;  Location: [facility] ENDO SUITE;  Service: Gastroenterology;  Laterality: N/A;  . EXPLORATORY LAPAROTOMY W/ BOWEL RESECTION  2003  . LITHOTRIPSY  1980  . PROSTATECTOMY    . TONSILLECTOMY    . VENTRAL HERNIA REPAIR     2 times        Home Medications    Prior to Admission medications   Medication Sig Start Date End Date Taking? Authorizing Provider  allopurinol (ZYLOPRIM) 100 MG tablet TAKE 2 TABLETS DAILY AS DIRECTED Patient taking differently: Take 200 mg by mouth every morning. TAKE 2 TABLETS DAILY AS DIRECTE 05/28/17  Yes Chipper Herb,  MD  aspirin 81 MG EC tablet Take 81 mg by mouth daily.     Yes [provider]  aspirin EC 325 MG tablet Take 325 mg by mouth daily.   Yes [provider]  atorvastatin (LIPITOR) 40 MG tablet TAKE 1 TABLET EVERY DAY AS DIRECTED 12/30/17  Yes Chipper Herb, MD  Cholecalciferol (VITAMIN D3) 1000 UNITS CAPS Take 4 capsules by mouth every morning.    Yes [provider]  fluticasone (FLONASE) 50 MCG/ACT nasal spray Place 2 sprays into both nostrils daily. Patient taking differently: Place 2 sprays into both  nostrils at bedtime.  07/03/17  Yes Chipper Herb, MD  gemfibrozil (LOPID) 600 MG tablet Take 1 tablet (600 mg total) by mouth daily. Patient taking differently: Take 600 mg by mouth every Monday, Wednesday, and Friday.  09/17/17  Yes Chipper Herb, MD  hydrochlorothiazide (MICROZIDE) 12.5 MG capsule TAKE 1 TABLET (12.5 MG TOTAL) BY MOUTH DAILY. AS DIRECTED 12/05/17  Yes Chipper Herb, MD  losartan (COZAAR) 100 MG tablet TAKE 1/2 TABLET BY MOUTH EVERY DAY AS DIRECTED 12/20/17  Yes Barton Dubois, MD  metoprolol tartrate (LOPRESSOR) 25 MG tablet Take 1 tablet (25 mg total) by mouth 2 (two) times daily. 01/07/18  Yes Chipper Herb, MD  OMEGA-3 1000 MG CAPS Take 2 capsules by mouth 2 (two) times daily.   Yes [provider]  potassium chloride SA (KLOR-CON M20) 20 MEQ tablet Take 2 tablets (40 mEq total) by mouth daily. 01/07/18  Yes Chipper Herb, MD  HYDROmorphone (DILAUDID) 2 MG tablet Take 1-2 capsules by mouth every 3 hours as needed Patient not taking: Reported on 01/09/2018 09/09/17   Pyrtle, Lajuan Lines, MD  nystatin-triamcinolone (MYCOLOG II) cream Apply 1 application topically 2 (two) times daily. Patient not taking: Reported on 01/09/2018 08/22/15   Chipper Herb, MD    Family History Family History  Problem Relation Age of Onset  . Heart disease Mother   . Cervical cancer Mother   . Heart disease Father   . Heart failure Father   . Dislocations Daughter 8       Bilateral knee  . Heart disease Maternal Uncle   . Colon cancer Neg Hx     Social History Social History   Tobacco Use  . Smoking status: Former Smoker    Packs/day: 1.00    Years: 38.00    Pack years: 38.00    Types: Cigarettes    Last attempt to quit: 03/19/1991    Years since quitting: 26.8  . Smokeless tobacco: Current User    Types: Chew  Substance Use Topics  . Alcohol use: No  . Drug use: No     Allergies   Colchicine   Review of Systems Review of Systems  All other systems reviewed and  are negative.    Physical Exam Updated Vital Signs BP 131/75 (BP Location: Right Arm)   Pulse 68   Temp 98.1 F (36.7 C) (Oral)   Resp 16   Ht 5' 7"  (1.702 m)   Wt 109.8 kg (242 lb)   SpO2 97%   BMI 37.90 kg/m   Physical Exam  Constitutional: He is oriented to person, place, and time. He appears well-developed and well-nourished.  HENT:  Head: Normocephalic and atraumatic.  Eyes: EOM are normal.  Neck: Normal range of motion.  Cardiovascular: Normal rate, regular rhythm, normal heart sounds and intact distal pulses.  Pulmonary/Chest: Effort normal and breath sounds normal. No  respiratory distress.  Abdominal: Soft. He exhibits no distension. There is no tenderness.  Musculoskeletal: Normal range of motion.  Neurological: He is alert and oriented to person, place, and time.  Skin: Skin is warm and dry.  Psychiatric: He has a normal mood and affect. Judgment normal.  Nursing note and vitals reviewed.    ED Treatments / Results  Labs (all labs ordered are listed, but only abnormal results are displayed) Labs Reviewed  BASIC METABOLIC PANEL - Abnormal; Notable for the following components:      Result Value   Chloride 112 (*)    Glucose, Bld 103 (*)    GFR calc non Af Amer 60 (*)    All other components within normal limits  CBC - Abnormal; Notable for the following components:   Hemoglobin 17.8 (*)    All other components within normal limits  I-STAT TROPONIN, ED  I-STAT TROPONIN, ED    EKG EKG Interpretation  Date/Time:  Thursday January 09 2018 10:43:17 EDT Ventricular Rate:  71 PR Interval:    QRS Duration: 95 QT Interval:  402 QTC Calculation: 437 R Axis:   89 Text Interpretation:  Sinus rhythm Borderline right axis deviation Low voltage, precordial leads No significant change was found Confirmed by Jola Schmidt [phone]) on 01/09/2018 12:04:39 PM   Radiology Dg Chest 2 View  Result Date: 01/09/2018 CLINICAL DATA:  Acute chest pain and shortness of  breath. EXAM: CHEST - 2 VIEW COMPARISON:  12/19/2017 FINDINGS: The cardiomediastinal silhouette is unremarkable. Mild peribronchial thickening is unchanged. There is no evidence of focal airspace disease, pulmonary edema, suspicious pulmonary nodule/mass, pleural effusion, or pneumothorax. No acute bony abnormalities are identified. IMPRESSION: No active cardiopulmonary disease. Electronically Signed   By: Margarette Canada M.D.   On: 01/09/2018 11:49    Procedures Procedures (including critical care time)  Medications Ordered in ED Medications - No data to display   Initial Impression / Assessment and Plan / ED Course  I have reviewed the triage vital signs and the nursing notes.  Pertinent labs & imaging results that were available during my care of the patient were reviewed by me and considered in my medical decision making (see chart for details).     Resolution of symptoms with resolution of palpitations. Likely SVT or afib/flutter. Will obtain serial troponin levels.   4:23 PM Patient continues to be a symptomatic at this time.  Patient is in sinus rhythm.  The only time he develops chest discomfort is with his tachyarrhythmia.  This is likely atrial fib or flutter versus SVT.  This was not captured during this ER visit.  He is scheduled for stress test tomorrow with cardiology.  I think this is reasonable.  I have recommended that he speak with his providers there about his symptoms today.  He will need outpatient Holter monitor.  Troponin negative x2.  Final Clinical Impressions(s) / ED Diagnoses   Final diagnoses:  None    ED Discharge Orders    None       Jola Schmidt, MD 01/09/18 1624

## 2018-01-09 NOTE — Telephone Encounter (Signed)
Returned call to daughter, not on [place]. Verified patient's birthday.   She states she would like Dr. Gwenlyn Found to be aware that patient went to [facility] ED for symptoms noted below (similar to what he was seen on 01/02/18 for). She would like to know if he should keep his 3/29 stress test appt. Advised he should keep this appt if he is not admitted to the hospital, otherwise cancel.   Routed to MD/CMA as Juluis Rainier

## 2018-01-09 NOTE — ED Triage Notes (Signed)
Patient c/o chest pressure since 0830 today with SOB. Patient states he had an episode last week with the same symptoms and his HR was around 160. Patient reports that he is scheduled for a stress test tomorrow and is suppose to be set up for a holter monitor

## 2018-01-10 ENCOUNTER — Encounter: Payer: Self-pay | Admitting: Cardiology

## 2018-01-10 ENCOUNTER — Ambulatory Visit (HOSPITAL_COMMUNITY)
Admission: RE | Admit: 2018-01-10 | Discharge: 2018-01-10 | Disposition: A | Discharge status: Home / Self Care | Payer: Medicare HMO | Source: Ambulatory Visit | Attending: Cardiology | Admitting: Cardiology

## 2018-01-10 DIAGNOSIS — R072 Precordial pain: Secondary | ICD-10-CM

## 2018-01-10 DIAGNOSIS — R0602 Shortness of breath: Secondary | ICD-10-CM

## 2018-01-10 DIAGNOSIS — I471 Supraventricular tachycardia: Secondary | ICD-10-CM

## 2018-01-14 ENCOUNTER — Ambulatory Visit (HOSPITAL_COMMUNITY)
Admission: RE | Admit: 2018-01-14 | Discharge: 2018-01-14 | Disposition: A | Discharge status: Home / Self Care | Payer: Medicare HMO | Source: Ambulatory Visit | Attending: Cardiology | Admitting: Cardiology

## 2018-01-14 DIAGNOSIS — R072 Precordial pain: Secondary | ICD-10-CM | POA: Diagnosis not present

## 2018-01-14 DIAGNOSIS — R0602 Shortness of breath: Secondary | ICD-10-CM | POA: Diagnosis not present

## 2018-01-14 DIAGNOSIS — I471 Supraventricular tachycardia: Secondary | ICD-10-CM | POA: Insufficient documentation

## 2018-01-14 LAB — MYOCARDIAL PERFUSION IMAGING
CHL CUP NUCLEAR SDS: 5
CHL CUP NUCLEAR SRS: 7
LV dias vol: 99 mL (ref 62–150)
LV sys vol: 49 mL
NUC STRESS TID: 1.45
Peak HR: 78 {beats}/min
Rest HR: 59 {beats}/min
SSS: 12

## 2018-01-14 MED ORDER — REGADENOSON 0.4 MG/5ML IV SOLN
0.4000 mg | Freq: Once | INTRAVENOUS | Status: AC
  Administered 2018-01-14: 0.4 mg via INTRAVENOUS

## 2018-01-14 MED ORDER — TECHNETIUM TC 99M TETROFOSMIN IV KIT
31.2000 | PACK | Freq: Once | INTRAVENOUS | Status: AC | PRN
  Administered 2018-01-14: 31.2 via INTRAVENOUS
  Filled 2018-01-14: qty 32

## 2018-01-14 MED ORDER — TECHNETIUM TC 99M TETROFOSMIN IV KIT
10.3000 | PACK | Freq: Once | INTRAVENOUS | Status: AC | PRN
  Administered 2018-01-14: 10.3 via INTRAVENOUS
  Filled 2018-01-14: qty 11

## 2018-01-15 ENCOUNTER — Ambulatory Visit (INDEPENDENT_AMBULATORY_CARE_PROVIDER_SITE_OTHER): Payer: Medicare HMO

## 2018-01-15 ENCOUNTER — Telehealth: Payer: Self-pay | Admitting: Cardiovascular Disease

## 2018-01-15 DIAGNOSIS — R0602 Shortness of breath: Secondary | ICD-10-CM

## 2018-01-15 DIAGNOSIS — R072 Precordial pain: Secondary | ICD-10-CM | POA: Diagnosis not present

## 2018-01-15 DIAGNOSIS — I471 Supraventricular tachycardia: Secondary | ICD-10-CM

## 2018-01-15 NOTE — Telephone Encounter (Signed)
Follow Up:     Returning your call,concerning his Stress Test results.

## 2018-01-16 ENCOUNTER — Telehealth: Payer: Self-pay | Admitting: Cardiovascular Disease

## 2018-01-16 NOTE — Telephone Encounter (Signed)
Patient called w/explanation of stress test results.

## 2018-01-16 NOTE — Telephone Encounter (Signed)
Attempted to call back patient in regards to stress test - results copied below Phone rang, no answer.   Notes recorded by Waylan Rocher, LPN on 02/19/176 at 9:39 PM EDT Patient called. Left DETAILED NORMAL message for patient to call back WITH ANY FURTHER QUESTIONS. ------  Notes recorded by Lorretta Harp, MD on 01/15/2018 at 8:10 AM EDT Low risk study without ischemia

## 2018-01-16 NOTE — Telephone Encounter (Signed)
New message   Pt verbalized that he is calling for RN  About his results he want to know if the test for the   Myocardial Perfusion 01/14/2018 was a test that will rule out if he has a blockage  Please call pt

## 2018-02-06 ENCOUNTER — Telehealth: Payer: Self-pay | Admitting: Family Medicine

## 2018-02-06 MED ORDER — METOPROLOL TARTRATE 50 MG PO TABS: 50.0000 mg | ORAL_TABLET | Freq: Two times a day (BID) | ORAL | 0 refills | Status: DC

## 2018-02-06 NOTE — Telephone Encounter (Signed)
Rx reordered as it is in chart and pt is aware.

## 2018-02-07 ENCOUNTER — Ambulatory Visit (INDEPENDENT_AMBULATORY_CARE_PROVIDER_SITE_OTHER): Payer: Medicare HMO | Admitting: *Deleted

## 2018-02-07 DIAGNOSIS — E538 Deficiency of other specified B group vitamins: Secondary | ICD-10-CM | POA: Diagnosis not present

## 2018-02-07 NOTE — Progress Notes (Signed)
Pt  given cyanocobalamin inj Tolerated well

## 2018-03-01 ENCOUNTER — Other Ambulatory Visit: Payer: Self-pay | Admitting: Family Medicine

## 2018-03-05 ENCOUNTER — Encounter: Payer: Self-pay | Admitting: Family Medicine

## 2018-03-05 ENCOUNTER — Telehealth: Payer: Self-pay | Admitting: Family Medicine

## 2018-03-05 ENCOUNTER — Ambulatory Visit (INDEPENDENT_AMBULATORY_CARE_PROVIDER_SITE_OTHER): Payer: Medicare HMO | Admitting: Family Medicine

## 2018-03-05 ENCOUNTER — Telehealth: Payer: Self-pay | Admitting: Cardiovascular Disease

## 2018-03-05 VITALS — BP 114/66 | HR 67 | Temp 97.3°F | Ht 67.0 in | Wt 245.0 lb

## 2018-03-05 DIAGNOSIS — D696 Thrombocytopenia, unspecified: Secondary | ICD-10-CM | POA: Diagnosis not present

## 2018-03-05 DIAGNOSIS — R531 Weakness: Secondary | ICD-10-CM

## 2018-03-05 DIAGNOSIS — E559 Vitamin D deficiency, unspecified: Secondary | ICD-10-CM

## 2018-03-05 DIAGNOSIS — R0789 Other chest pain: Secondary | ICD-10-CM

## 2018-03-05 DIAGNOSIS — D45 Polycythemia vera: Secondary | ICD-10-CM

## 2018-03-05 DIAGNOSIS — I10 Essential (primary) hypertension: Secondary | ICD-10-CM

## 2018-03-05 DIAGNOSIS — I700 Atherosclerosis of aorta: Secondary | ICD-10-CM | POA: Diagnosis not present

## 2018-03-05 DIAGNOSIS — J410 Simple chronic bronchitis: Secondary | ICD-10-CM | POA: Diagnosis not present

## 2018-03-05 DIAGNOSIS — E538 Deficiency of other specified B group vitamins: Secondary | ICD-10-CM

## 2018-03-05 DIAGNOSIS — C61 Malignant neoplasm of prostate: Secondary | ICD-10-CM

## 2018-03-05 DIAGNOSIS — E7800 Pure hypercholesterolemia, unspecified: Secondary | ICD-10-CM | POA: Diagnosis not present

## 2018-03-05 DIAGNOSIS — E8881 Metabolic syndrome: Secondary | ICD-10-CM | POA: Diagnosis not present

## 2018-03-05 DIAGNOSIS — K5090 Crohn's disease, unspecified, without complications: Secondary | ICD-10-CM

## 2018-03-05 MED ORDER — FLUTICASONE PROPIONATE 50 MCG/ACT NA SUSP: 2.0000 | Freq: Every day | NASAL | 6 refills | Status: DC

## 2018-03-05 MED ORDER — METOPROLOL TARTRATE 50 MG PO TABS: 50.0000 mg | ORAL_TABLET | Freq: Two times a day (BID) | ORAL | 3 refills | Status: DC

## 2018-03-05 MED ORDER — NYSTATIN-TRIAMCINOLONE 100000-0.1 UNIT/GM-% EX CREA: 1.0000 "application " | TOPICAL_CREAM | Freq: Two times a day (BID) | CUTANEOUS | 3 refills | Status: AC

## 2018-03-05 NOTE — Progress Notes (Signed)
Subjective:    Patient ID: Luis Deleon, male    DOB: [birth year], 77 y.o.   MRN: [id number]  HPI Pt here for follow up and management of chronic medical problems which includes hyperlipidemia and hypertension. He is taking medication regularly.  The patient today just complains of being weak and lightheaded.  He also has questions about his vision and ears.  He is requesting refills on metoprolol and nystatin.  He will be given an FOBT to return and will get lab work today.  He is past due on his colonoscopy and we will make sure that this is followed up on appropriately.  This patient has diagnoses of abdominal aortic atherosclerosis Crohn's disease hyperlipidemia hypertension gout prostate cancer and thrombocytopenia.  He recently was in the hospital emergency room because of acute chest pain.  The test was a low risk study without ischemia.  The ejection fraction was slightly decreased between 45 and 54%.  The patient also has B12 deficiency.  He also has COPD.  The patient also indicates that he wore a monitor for his heart for about 30 days and has not heard the results of that.  He is still having weak spells as he seems to be worse in the morning.  His blood pressure typically runs a little bit lower in the morning than in the evening.  He says his pulse rates usually run around in the 90s.  In the morning he has tightness at times lightheadedness and dizziness and it is hard to take a deep breath and he indicates that these episodes are not related to activity.  He does have a shortness of breath like I said with these episodes.  And all these episodes have been going on for the past 2 to 3 months and included the reasons for going to the hospital emergency room.  His GI issues with his Crohn's disease are stable and he still has ongoing loose bowel movements but no blood and no heartburn.  He is passing his water without problems.  He is requesting refills on metoprolol and  nystatin.    Patient Active Problem List   Diagnosis Date Noted  . Hypokalemia 12/19/2017  . Tachyarrhythmia 12/19/2017  . Chest pain 12/19/2017  . 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 Abdominal aortic atherosclerosis ([place]) 11/25/2014  . Metabolic syndrome 24/26/8341  . DOE (dyspnea on exertion) 08/11/2013  . 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 POLYCYTHEMIA 11/03/2008  . LIVER FUNCTION TESTS, ABNORMAL, HX OF 09/24/2008  . Hyperlipidemia 07/28/2008  . GOUT, UNSPECIFIED 07/28/2008  . HTN (hypertension) 07/28/2008  . Crohn disease ([place]) 07/28/2008  . DIVERTICULOSIS, COLON 07/28/2008  . Prostate cancer ([place]) 07/28/2008  . SMALL BOWEL OBSTRUCTION, HX OF 07/28/2008  . NEPHROLITHIASIS, HX OF 07/28/2008   Outpatient Encounter Medications as of 03/05/2018  Medication Sig  . allopurinol (ZYLOPRIM) 100 MG tablet TAKE 2 TABLETS DAILY AS DIRECTED (Patient taking differently: Take 200 mg by mouth every morning. TAKE 2 TABLETS DAILY AS DIRECTE)  . aspirin 81 MG EC tablet Take 81 mg by mouth daily.    Marland Kitchen atorvastatin (LIPITOR) 40 MG tablet TAKE 1 TABLET EVERY DAY AS DIRECTED  . Cholecalciferol (VITAMIN D3) 1000 UNITS CAPS Take 4 capsules by mouth every morning.   . fluticasone (FLONASE) 50 MCG/ACT nasal spray Place 2 sprays into both nostrils daily. (Patient taking differently: Place 2 sprays into both nostrils at bedtime. )  . gemfibrozil (LOPID) 600 MG tablet Take 1 tablet (600 mg total) by mouth  daily. (Patient taking differently: Take 600 mg by mouth every Monday, Wednesday, and Friday. )  . hydrochlorothiazide (MICROZIDE) 12.5 MG capsule TAKE 1 TABLET (12.5 MG TOTAL) BY MOUTH DAILY. AS DIRECTED  . HYDROmorphone (DILAUDID) 2 MG tablet Take 1-2 capsules by mouth every 3 hours as needed  . losartan (COZAAR) 100 MG tablet TAKE 1/2 TABLET BY MOUTH EVERY DAY AS DIRECTED  . metoprolol tartrate (LOPRESSOR) 50 MG tablet TAKE 1 TABLET BY MOUTH TWICE A DAY  . nystatin-triamcinolone (MYCOLOG II)  cream Apply 1 application topically 2 (two) times daily.  . OMEGA-3 1000 MG CAPS Take 2 capsules by mouth 2 (two) times daily.  . potassium chloride SA (KLOR-CON M20) 20 MEQ tablet Take 2 tablets (40 mEq total) by mouth daily.  . [DISCONTINUED] aspirin EC 325 MG tablet Take 325 mg by mouth daily.   Facility-Administered Encounter Medications as of 03/05/2018  Medication  . cyanocobalamin ((VITAMIN B-12)) injection 1,000 mcg     Review of Systems  Constitutional: Negative.   HENT: Negative.   Eyes: Negative.   Respiratory: Negative.   Cardiovascular: Negative.   Gastrointestinal: Negative.   Endocrine: Negative.   Genitourinary: Negative.   Musculoskeletal: Negative.   Skin: Negative.   Allergic/Immunologic: Negative.   Neurological: Positive for weakness and light-headedness.  Hematological: Negative.   Psychiatric/Behavioral: Negative.        Objective:   Physical Exam  Constitutional: He is oriented to person, place, and time. He appears well-developed.  Patient is pleasant and alert but still having these spells and not sure what is causing them.  HENT:  Head: Normocephalic and atraumatic.  Right Ear: External ear normal.  Left Ear: External ear normal.  Mouth/Throat: Oropharynx is clear and moist. No oropharyngeal exudate.  Ear canals are clear and patient continues to have nasal congestion bilaterally  Eyes: Pupils are equal, round, and reactive to light. Conjunctivae and EOM are normal. Right eye exhibits no discharge. Left eye exhibits no discharge.  Patient plans to go back and have his vision rechecked and and his glasses.  Neck: Normal range of motion. Neck supple. No thyromegaly present.  No bruits thyromegaly or anterior cervical adenopathy  Cardiovascular: Normal rate, regular rhythm, normal heart sounds and intact distal pulses.  No murmur heard. Heart has a regular rhythm at 72 to 84/min.  Pulmonary/Chest: Effort normal and breath sounds normal. He has no  wheezes. He has no rales. He exhibits no tenderness.  Clear anteriorly and posteriorly no axillary adenopathy  Abdominal: Soft. Bowel sounds are normal. He exhibits no mass. There is no tenderness. There is no rebound and no guarding.  Abdominal obesity without masses tenderness or organ enlargement inguinal adenopathy or bruits  Musculoskeletal: Normal range of motion. He exhibits no edema.  Lymphadenopathy:    He has no cervical adenopathy.  Neurological: He is alert and oriented to person, place, and time. He has normal reflexes. No cranial nerve deficit.  Skin: Skin is warm and dry. No rash noted.  Psychiatric: He has a normal mood and affect. His behavior is normal. Judgment and thought content normal.  The patient's mood and affect and behavior are normal he is just worried about his family history of heart disease and why he is having the spells that he is been having over the past 2 to 3 months.  Nursing note and vitals reviewed.  BP 114/66 (BP Location: Left Arm)   Pulse 67   Temp (!) 97.3 F (36.3 C) (Oral)   Ht 5'  7" (1.702 m)   Wt 245 lb (111.1 kg)   BMI 38.37 kg/m         Assessment & Plan:  1. B12 deficiency -B12 shot today and check B12 level - CBC with Differential/Platelet  2. Pure hypercholesterolemia -Continue with as aggressive therapeutic lifestyle changes as possible and medicines to keep cholesterol down pending results of lab work - CBC with Differential/Platelet - Lipid panel  3. Essential hypertension -The blood pressure is good today and he will continue with current treatment - BMP8+EGFR - CBC with Differential/Platelet - Hepatic function panel  4. Vitamin D deficiency -Continue with vitamin D replacement pending results of lab work - CBC with Differential/Platelet - VITAMIN D 25 Hydroxy (Vit-D Deficiency, Fractures)  5. Thrombocytopenia (H[facility]) -No increased history of bruising or bleeding. - CBC with Differential/Platelet  6. Metabolic  syndrome -Continue to work on weight loss through diet and exercise - CBC with Differential/Platelet  7. Prostate cancer ([place]) - CBC with Differential/Platelet  8. Vitamin B 12 deficiency -Check B12 level today  9. Abdominal aortic atherosclerosis ([place]) -Continue with aggressive therapeutic lifestyle changes and statin therapy  10. Polycythemia vera (HCC) -Check CBC  11. Crohn's disease without complication, unspecified gastrointestinal tract location [facility]) -Follow-up with gastroenterology as planned  12. Simple chronic bronchitis (HCC) -No issues with breathing other than some shortness of breath with these episodes of dizziness and chest tightness  13. Weakness -Weakness with episodes of chest tightness and shortness of breath  14. Chest tightness -Follow-up with cardiology as planned  Meds ordered this encounter  Medications  . nystatin-triamcinolone (MYCOLOG II) cream    Sig: Apply 1 application topically 2 (two) times daily.    Dispense:  30 g    Refill:  3  . metoprolol tartrate (LOPRESSOR) 50 MG tablet    Sig: Take 1 tablet (50 mg total) by mouth 2 (two) times daily.    Dispense:  180 tablet    Refill:  3   Patient Instructions                       Medicare Annual Wellness Visit  [facility] and the medical providers at [facility] strive to bring you the best medical care.  In doing so we not only want to address your current medical conditions and concerns but also to detect new conditions early and prevent illness, disease and health-related problems.    Medicare offers a yearly Wellness Visit which allows our clinical staff to assess your need for preventative services including immunizations, lifestyle education, counseling to decrease risk of preventable diseases and screening for fall risk and other medical concerns.    This visit is provided free of charge (no copay) for all Medicare recipients. The clinical pharmacists at [facility] have begun to conduct these Wellness Visits which will also include a thorough review of all your medications.    As you primary medical provider recommend that you make an appointment for your Annual Wellness Visit if you have not done so already this year.  You may set up this appointment before you leave today or you may call back ([phone]) and schedule an appointment.  Please make sure when you call that you mention that you are scheduling your Annual Wellness Visit with the clinical pharmacist so that the appointment may be made for the proper length of time.     Continue current medications. Continue good therapeutic lifestyle changes which include  good diet and exercise. Fall precautions discussed with patient. If an FOBT was given today- please return it to our front desk. If you are over 40 years old - you may need Prevnar 63 or the adult Pneumonia vaccine.  **Flu shots are available--- please call and schedule a FLU-CLINIC appointment**  After your visit with [place] today you will receive a survey in the mail or online from [employer] regarding your care with [place]. Please take a moment to fill this out. Your feedback is very important to [place] as you can help [place] better understand your patient needs as well as improve your experience and satisfaction. WE CARE ABOUT YOU!!!   Follow-up with cardiology as planned Continue to monitor blood pressure and pulse rates at home and see if you can correlate you are feeling bad with the blood pressure or heart rate We will also check with the cardiologist regarding the monitor that was submitted and depending on those results may have to get an appointment with the cardiologist sooner rather than later We will also check a B12 level with your blood work that was drawn today. Be sure and discuss with Dr. Hilarie Fredrickson your need for your next colonoscopy when you see him the summer. Check blood pressures and pulse rates and record these  at home and bring the readings by for [place] to review in 2 to 3 weeks and make note if you have any spells on the day and time when you are doing the checks  Arrie Senate MD

## 2018-03-05 NOTE — Telephone Encounter (Signed)
New Message:   Pt wants to know if his events monitor results are ready?

## 2018-03-05 NOTE — Telephone Encounter (Signed)
He spoke with DR Gwenlyn Found - all looked great

## 2018-03-05 NOTE — Patient Instructions (Addendum)
Medicare Annual Wellness Visit  [facility] and the medical providers at [facility] strive to bring you the best medical care.  In doing so we not only want to address your current medical conditions and concerns but also to detect new conditions early and prevent illness, disease and health-related problems.    Medicare offers a yearly Wellness Visit which allows our clinical staff to assess your need for preventative services including immunizations, lifestyle education, counseling to decrease risk of preventable diseases and screening for fall risk and other medical concerns.    This visit is provided free of charge (no copay) for all Medicare recipients. The clinical pharmacists at [facility] have begun to conduct these Wellness Visits which will also include a thorough review of all your medications.    As you primary medical provider recommend that you make an appointment for your Annual Wellness Visit if you have not done so already this year.  You may set up this appointment before you leave today or you may call back ([phone]) and schedule an appointment.  Please make sure when you call that you mention that you are scheduling your Annual Wellness Visit with the clinical pharmacist so that the appointment may be made for the proper length of time.     Continue current medications. Continue good therapeutic lifestyle changes which include good diet and exercise. Fall precautions discussed with patient. If an FOBT was given today- please return it to our front desk. If you are over 75 years old - you may need Prevnar 24 or the adult Pneumonia vaccine.  **Flu shots are available--- please call and schedule a FLU-CLINIC appointment**  After your visit with [place] today you will receive a survey in the mail or online from [employer] regarding your care with [place]. Please take a moment to fill this out. Your feedback is very  important to [place] as you can help [place] better understand your patient needs as well as improve your experience and satisfaction. WE CARE ABOUT YOU!!!   Follow-up with cardiology as planned Continue to monitor blood pressure and pulse rates at home and see if you can correlate you are feeling bad with the blood pressure or heart rate We will also check with the cardiologist regarding the monitor that was submitted and depending on those results may have to get an appointment with the cardiologist sooner rather than later We will also check a B12 level with your blood work that was drawn today. Be sure and discuss with Dr. Hilarie Fredrickson your need for your next colonoscopy when you see him the summer. Check blood pressures and pulse rates and record these at home and bring the readings by for [place] to review in 2 to 3 weeks and make note if you have any spells on the day and time when you are doing the checks

## 2018-03-05 NOTE — Telephone Encounter (Signed)
If patient continues to have weak spells, may want to get him to see cardiologist here ,keep [place] poated.

## 2018-03-05 NOTE — Telephone Encounter (Signed)
Routing to Dr. Gwenlyn Found to review event monitor. It does not look like the results have been read, but the monitor is listed under Media tab and "EKG" Cardiac event monitor from 01/15/18.

## 2018-03-05 NOTE — Telephone Encounter (Signed)
I looked at this and appeared to be only sinus rhythm.

## 2018-03-05 NOTE — Telephone Encounter (Signed)
Spoke with pt, aware of monitor results. 

## 2018-03-06 ENCOUNTER — Other Ambulatory Visit: Payer: Medicare HMO

## 2018-03-06 DIAGNOSIS — Z129 Encounter for screening for malignant neoplasm, site unspecified: Secondary | ICD-10-CM

## 2018-03-06 LAB — CBC WITH DIFFERENTIAL/PLATELET
BASOS: 0 %
Basophils Absolute: 0 10*3/uL (ref 0.0–0.2)
EOS (ABSOLUTE): 0.2 10*3/uL (ref 0.0–0.4)
EOS: 2 %
HEMATOCRIT: 51 % (ref 37.5–51.0)
HEMOGLOBIN: 17.8 g/dL — AB (ref 13.0–17.7)
IMMATURE GRANS (ABS): 0 10*3/uL (ref 0.0–0.1)
IMMATURE GRANULOCYTES: 0 %
LYMPHS: 23 %
Lymphocytes Absolute: 1.9 10*3/uL (ref 0.7–3.1)
MCH: 32.7 pg (ref 26.6–33.0)
MCHC: 34.9 g/dL (ref 31.5–35.7)
MCV: 94 fL (ref 79–97)
MONOCYTES: 10 %
Monocytes Absolute: 0.8 10*3/uL (ref 0.1–0.9)
NEUTROS PCT: 65 %
Neutrophils Absolute: 5.3 10*3/uL (ref 1.4–7.0)
Platelets: 154 10*3/uL (ref 150–450)
RBC: 5.45 x10E6/uL (ref 4.14–5.80)
RDW: 15.3 % (ref 12.3–15.4)
WBC: 8.2 10*3/uL (ref 3.4–10.8)

## 2018-03-06 LAB — HEPATIC FUNCTION PANEL
ALT: 32 IU/L (ref 0–44)
AST: 34 IU/L (ref 0–40)
Albumin: 3.9 g/dL (ref 3.5–4.8)
Alkaline Phosphatase: 61 IU/L (ref 39–117)
Bilirubin Total: 1.5 mg/dL — ABNORMAL HIGH (ref 0.0–1.2)
Bilirubin, Direct: 0.31 mg/dL (ref 0.00–0.40)
Total Protein: 6.4 g/dL (ref 6.0–8.5)

## 2018-03-06 LAB — LIPID PANEL
CHOL/HDL RATIO: 3.3 ratio (ref 0.0–5.0)
Cholesterol, Total: 122 mg/dL (ref 100–199)
HDL: 37 mg/dL — ABNORMAL LOW (ref >39)
LDL CALC: 59 mg/dL (ref 0–99)
TRIGLYCERIDES: 130 mg/dL (ref 0–149)
VLDL CHOLESTEROL CAL: 26 mg/dL (ref 5–40)

## 2018-03-06 LAB — BMP8+EGFR
BUN/Creatinine Ratio: 10 (ref 10–24)
BUN: 13 mg/dL (ref 8–27)
CO2: 24 mmol/L (ref 20–29)
CREATININE: 1.3 mg/dL — AB (ref 0.76–1.27)
Calcium: 9.4 mg/dL (ref 8.6–10.2)
Chloride: 104 mmol/L (ref 96–106)
GFR calc Af Amer: 62 mL/min/{1.73_m2} (ref >59)
GFR, EST NON AFRICAN AMERICAN: 54 mL/min/{1.73_m2} — AB (ref >59)
Glucose: 94 mg/dL (ref 65–99)
Potassium: 4.1 mmol/L (ref 3.5–5.2)
SODIUM: 143 mmol/L (ref 134–144)

## 2018-03-06 LAB — VITAMIN D 25 HYDROXY (VIT D DEFICIENCY, FRACTURES): VIT D 25 HYDROXY: 51.2 ng/mL (ref 30.0–100.0)

## 2018-03-07 LAB — FECAL OCCULT BLOOD, IMMUNOCHEMICAL: Fecal Occult Bld: POSITIVE — AB

## 2018-03-09 ENCOUNTER — Other Ambulatory Visit: Payer: Self-pay | Admitting: Family Medicine

## 2018-03-11 ENCOUNTER — Other Ambulatory Visit: Payer: Self-pay | Admitting: *Deleted

## 2018-03-11 DIAGNOSIS — R195 Other fecal abnormalities: Secondary | ICD-10-CM

## 2018-03-11 DIAGNOSIS — E7800 Pure hypercholesterolemia, unspecified: Secondary | ICD-10-CM

## 2018-03-24 ENCOUNTER — Other Ambulatory Visit: Payer: Self-pay | Admitting: Family Medicine

## 2018-03-26 ENCOUNTER — Other Ambulatory Visit: Payer: Self-pay | Admitting: *Deleted

## 2018-03-26 DIAGNOSIS — R7989 Other specified abnormal findings of blood chemistry: Secondary | ICD-10-CM

## 2018-03-30 IMAGING — CR DG CHEST 1V PORT
1 series · 1 of 1 positions shown · non-contrast
Comparison: Portable exam 2144 hours compared to 10/24/2017

CLINICAL DATA: Intermittent chest tightness and shortness of breath
hypertension

EXAM:
PORTABLE CHEST 1 VIEW

[portable]
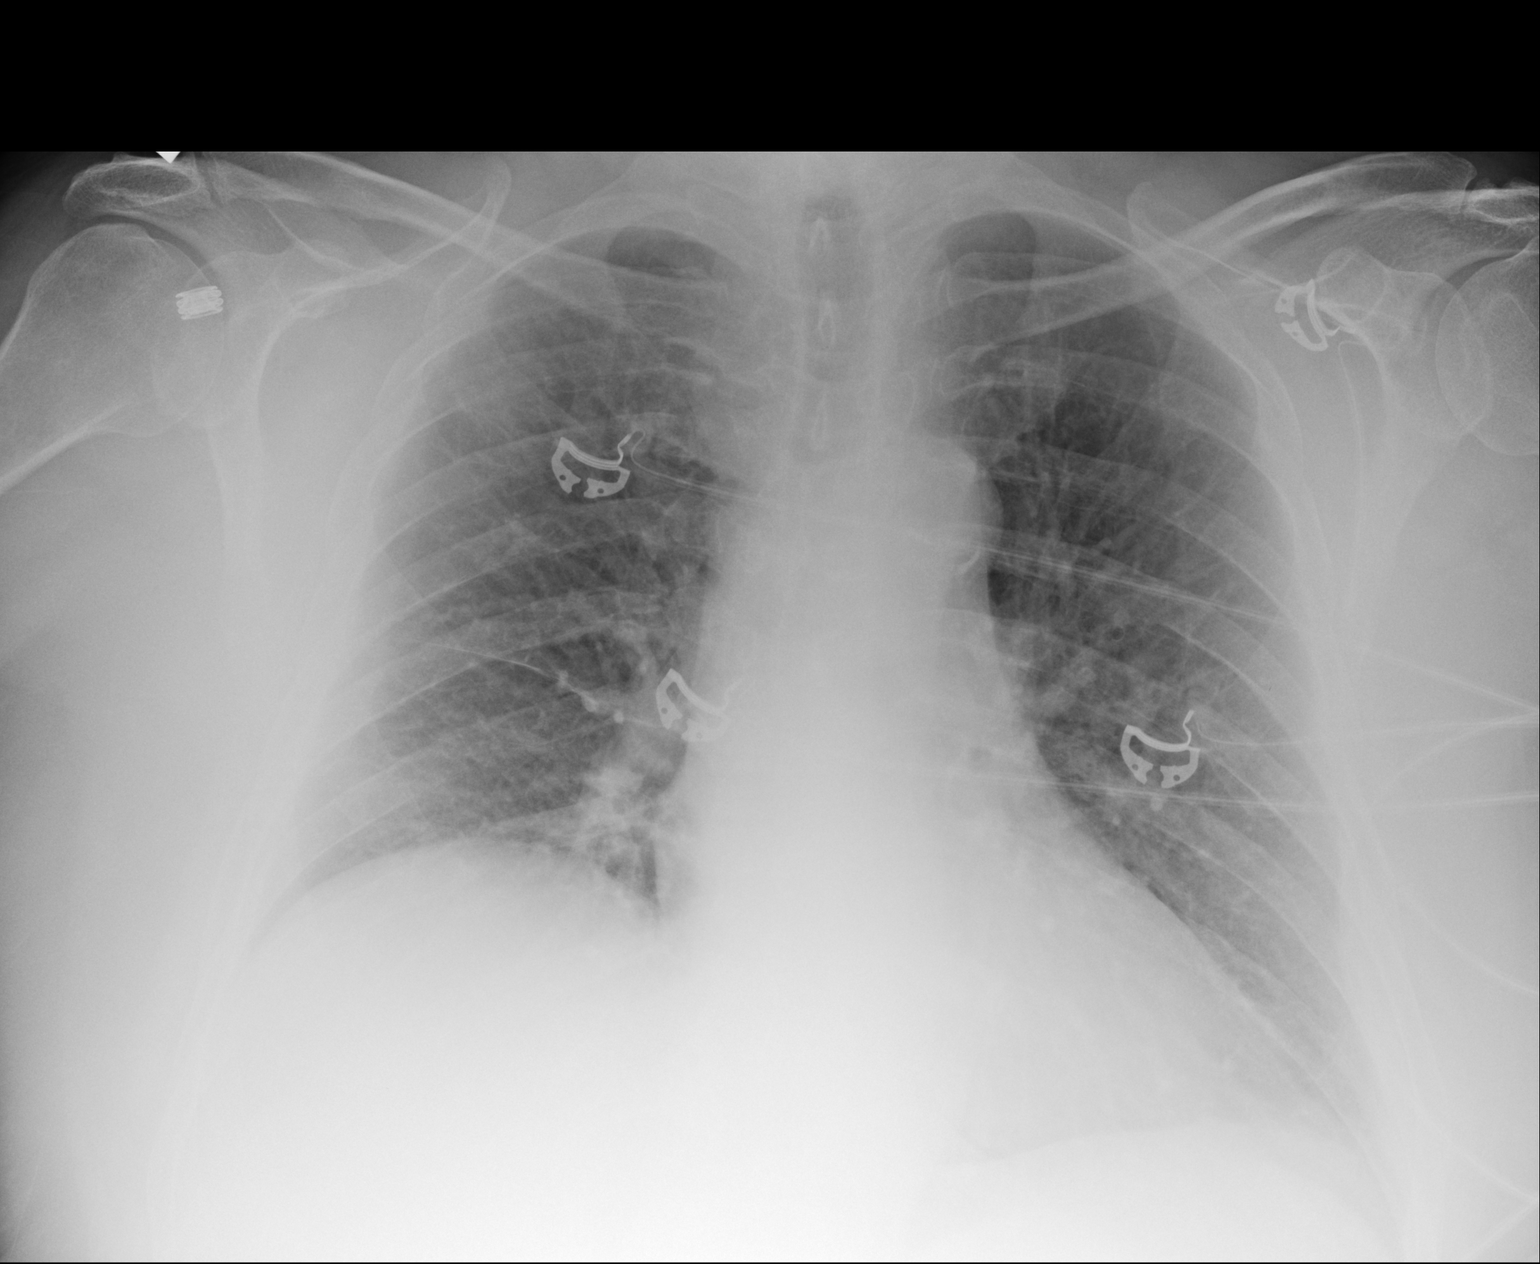

[1 of 1 positions shown; findings below may reference images not displayed]

FINDINGS: Normal heart size, mediastinal contours, and pulmonary vascularity.

Atherosclerotic calcification aorta.

Chronic elevation of RIGHT diaphragm with minimal RIGHT basilar
atelectasis.

No acute infiltrate, pleural effusion, or pneumothorax.

Bones unremarkable.
IMPRESSION: Minimal RIGHT basilar atelectasis.

No acute infiltrate.

## 2018-04-01 ENCOUNTER — Other Ambulatory Visit: Payer: Medicare HMO

## 2018-04-01 DIAGNOSIS — R7989 Other specified abnormal findings of blood chemistry: Secondary | ICD-10-CM | POA: Diagnosis not present

## 2018-04-01 LAB — BMP8+EGFR
BUN / CREAT RATIO: 11 (ref 10–24)
BUN: 14 mg/dL (ref 8–27)
CHLORIDE: 107 mmol/L — AB (ref 96–106)
CO2: 23 mmol/L (ref 20–29)
Calcium: 9.2 mg/dL (ref 8.6–10.2)
Creatinine, Ser: 1.24 mg/dL (ref 0.76–1.27)
GFR calc Af Amer: 66 mL/min/{1.73_m2} (ref >59)
GFR calc non Af Amer: 57 mL/min/{1.73_m2} — ABNORMAL LOW (ref >59)
GLUCOSE: 104 mg/dL — AB (ref 65–99)
Potassium: 3.8 mmol/L (ref 3.5–5.2)
SODIUM: 143 mmol/L (ref 134–144)

## 2018-04-07 ENCOUNTER — Ambulatory Visit (INDEPENDENT_AMBULATORY_CARE_PROVIDER_SITE_OTHER): Payer: Medicare HMO | Admitting: *Deleted

## 2018-04-07 DIAGNOSIS — E538 Deficiency of other specified B group vitamins: Secondary | ICD-10-CM | POA: Diagnosis not present

## 2018-04-14 ENCOUNTER — Other Ambulatory Visit: Payer: Medicare HMO

## 2018-04-14 DIAGNOSIS — Z1211 Encounter for screening for malignant neoplasm of colon: Secondary | ICD-10-CM

## 2018-04-15 LAB — FECAL OCCULT BLOOD, IMMUNOCHEMICAL: FECAL OCCULT BLD: POSITIVE — AB

## 2018-04-17 ENCOUNTER — Other Ambulatory Visit: Payer: Self-pay | Admitting: Family Medicine

## 2018-04-22 ENCOUNTER — Telehealth: Payer: Self-pay | Admitting: Family Medicine

## 2018-04-22 ENCOUNTER — Other Ambulatory Visit: Payer: Self-pay | Admitting: *Deleted

## 2018-04-22 DIAGNOSIS — R195 Other fecal abnormalities: Secondary | ICD-10-CM

## 2018-04-22 NOTE — Telephone Encounter (Signed)
Aware or results.  Referral placed

## 2018-05-08 ENCOUNTER — Ambulatory Visit (INDEPENDENT_AMBULATORY_CARE_PROVIDER_SITE_OTHER): Payer: Medicare HMO | Admitting: *Deleted

## 2018-05-08 DIAGNOSIS — E538 Deficiency of other specified B group vitamins: Secondary | ICD-10-CM | POA: Diagnosis not present

## 2018-05-08 NOTE — Progress Notes (Signed)
Pt given Cyanocobalamin inj Tolerated well

## 2018-05-19 ENCOUNTER — Encounter: Payer: Self-pay | Admitting: Nurse Practitioner

## 2018-05-19 ENCOUNTER — Telehealth: Payer: Self-pay | Admitting: Family Medicine

## 2018-05-19 ENCOUNTER — Ambulatory Visit (INDEPENDENT_AMBULATORY_CARE_PROVIDER_SITE_OTHER): Payer: Medicare HMO | Admitting: Nurse Practitioner

## 2018-05-19 VITALS — BP 131/73 | HR 79 | Temp 98.1°F | Ht 67.0 in | Wt 242.2 lb

## 2018-05-19 DIAGNOSIS — R300 Dysuria: Secondary | ICD-10-CM | POA: Diagnosis not present

## 2018-05-19 DIAGNOSIS — N4829 Other inflammatory disorders of penis: Secondary | ICD-10-CM

## 2018-05-19 LAB — URINALYSIS
Bilirubin, UA: NEGATIVE
Glucose, UA: NEGATIVE
Ketones, UA: NEGATIVE
Nitrite, UA: NEGATIVE
Protein, UA: NEGATIVE
Specific Gravity, UA: 1.02 (ref 1.005–1.030)
UUROB: 0.2 mg/dL (ref 0.2–1.0)
pH, UA: 5.5 (ref 5.0–7.5)

## 2018-05-19 MED ORDER — CIPROFLOXACIN HCL 500 MG PO TABS: 500.0000 mg | ORAL_TABLET | Freq: Two times a day (BID) | ORAL | 0 refills | Status: DC

## 2018-05-19 NOTE — Patient Instructions (Signed)
Balanitis Balanitis is swelling and irritation (inflammation) of the head of the penis (glans penis). The condition may also cause inflammation of the skin around the glans penis (foreskin) in men who have not been circumcised. It may develop because of an infection or another medical condition. Balanitis occurs most often among men who have not had their foreskin removed (uncircumcised men). Balanitis sometimes causes scarring of the penis or foreskin, which can require surgery. Untreated balanitis can increase the risk of penile cancer. What are the causes? Common causes of this condition include:  Poor personal hygiene, especially in uncircumcised men. Not cleaning the glans penis and foreskin well can result in buildup of bacteria, viruses, and yeast, which can lead to infection and inflammation.  Irritation and lack of air flow due to fluid (smegma) that can build up on the glans penis.  Other causes include:  Chemical irritation from products such as soaps or shower gels (especially those that have fragrance), condoms, personal lubricants, petroleum jelly, spermicides, or fabric softeners.  Skin conditions, such as eczema, dermatitis, and psoriasis.  Allergies to medicines, such as tetracycline and sulfa drugs.  Certain medical conditions, including liver cirrhosis, congestive heart failure, diabetes, and kidney disease.  Infections, such as candidiasis, HPV (human papillomavirus), herpes simplex, gonorrhea, and syphilis.  Severe obesity.  What increases the risk? The following factors may make you more likely to develop this condition:  Having diabetes. This is the most common risk factor.  Having a tight foreskin that is difficult to pull back (retract) past the glans.  Having sexual intercourse without using a condom.  What are the signs or symptoms? Symptoms of this condition include:  Discharge from under the foreskin.  A bad smell.  Pain or difficulty retracting  the foreskin.  Tenderness, redness, and swelling of the glans.  A rash or sores on the glans or foreskin.  Itchiness.  Inability to get an erection due to pain.  Difficulty urinating.  Scarring of the penis or foreskin, in some cases.  How is this diagnosed? This condition may be diagnosed based on:  A physical exam.  Testing a swab of discharge to check for bacterial or fungal infection.  Blood tests: ? To check for viruses that can cause balanitis. ? To check your blood sugar (glucose) level. High blood glucose could be a sign of diabetes, which can cause balanitis.  How is this treated? Treatment for balanitis depends on the cause. Treatment may include:  Improving personal hygiene. Your health care provider may recommend sitting in a bath of warm water that is deep enough to cover your hips and buttocks (sitz bath).  Medicines such as: ? Creams or ointments to reduce swelling (steroids) or to treat an infection. ? Antibiotic medicine. ? Antifungal medicine.  Surgery to remove or cut the foreskin (circumcision). This may be done if you have scarring on the foreskin that makes it difficult to retract.  Controlling other medical problems that may be causing your condition or making it worse.  Follow these instructions at home:  Do not have sex until the condition clears up, or until your health care provider approves.  Keep your penis clean and dry. Take sitz baths as recommended by your health care provider.  Avoid products that irritate your skin or make symptoms worse, such as soaps and shower gels that have fragrance.  Take over-the-counter and prescription medicines only as told by your health care provider. ? If you were prescribed an antibiotic medicine or a cream  or ointment, use it as told by your health care provider. Do not stop using your medicine, cream, or ointment even if you start to feel better. ? Do not drive or use heavy machinery while taking  prescription pain medicine. Contact a health care provider if:  Your symptoms get worse or do not improve with home care.  You develop chills or a fever.  You have trouble urinating.  You cannot retract your foreskin. Get help right away if:  You develop severe pain.  You are unable to urinate. Summary  Balanitis is inflammation of the head of the penis (glans penis) caused by irritation or infection.  Balanitis causes pain, redness, and swelling of the glans penis.  This condition is most common among uncircumcised men who do not keep their glans penis clean and in men who have diabetes.  Treatment may include creams or ointments.  Good hygiene is important for prevention. This includes pulling back the foreskin when washing your penis. This information is not intended to replace advice given to you by your health care provider. Make sure you discuss any questions you have with your health care provider. Document Released: 02/17/2009 Document Revised: 08/20/2016 Document Reviewed: 08/20/2016 Elsevier Interactive Patient Education  2017 [employer].

## 2018-05-19 NOTE — Progress Notes (Signed)
   Subjective:    Patient ID: Luis Deleon, male    DOB: [birth year], 77 y.o.   MRN: [id number]   Chief Complaint: Dysuria (not circumcisized was not able to get foreskin back down other night when cleaning, was very painful, may need a referral to Dr. Jeffie Pollock )   HPI Patient has had some dysuria for about a week. He has ben using nystatin and that helped. He was taking a shower the other night and pulled his foreskin back and it was very tender. Had a very hard time getting foreskin back in place. He still burns when he urinates.    Review of Systems  Constitutional: Negative.   Respiratory: Negative.   Cardiovascular: Negative.   Genitourinary: Positive for penile pain.  Musculoskeletal: Negative.   Psychiatric/Behavioral: Negative.   All other systems reviewed and are negative.      Objective:   Physical Exam  Constitutional: He appears well-developed and well-nourished. No distress.  Cardiovascular: Normal rate.  Pulmonary/Chest: Effort normal.  Genitourinary:  Genitourinary Comments: Penis uncircumcised Foreskin erythematous with slight urinary leakage. Did not atttempt to pull foreskin back.  Skin: Skin is warm.   BP 131/73   Pulse 79   Temp 98.1 F (36.7 C) (Oral)   Ht 5' 7"  (1.702 m)   Wt 242 lb 3.2 oz (109.9 kg)   BMI 37.93 kg/m         Assessment & Plan:  Luis Deleon in today with chief complaint of Dysuria (not circumcisized was not able to get foreskin back down other night when cleaning, was very painful, may need a referral to Dr. Jeffie Pollock )   1. Dysuria  - Urinalysis  2. Foreskin inflammation Continue to keep area clean and dry If noit omproving will do urology referral - ciprofloxacin (CIPRO) 500 MG tablet; Take 1 tablet (500 mg total) by mouth 2 (two) times daily.  Dispense: 10 tablet; Refill: 0  Mary-Margaret Hassell Done, FNP

## 2018-05-19 NOTE — Telephone Encounter (Signed)
appt made for pt to come in for UA and referral

## 2018-05-21 LAB — URINE CULTURE

## 2018-06-01 ENCOUNTER — Other Ambulatory Visit: Payer: Self-pay | Admitting: Family Medicine

## 2018-06-04 ENCOUNTER — Telehealth: Payer: Self-pay | Admitting: Nurse Practitioner

## 2018-06-04 DIAGNOSIS — R300 Dysuria: Secondary | ICD-10-CM

## 2018-06-04 NOTE — Telephone Encounter (Signed)
Patient wants to see Dr Jeffie Pollock. Has seen him before but it has been a while. Recently seen here in our office for a UTI and treated but continues to have problems and irritation around his penis. Referral placed per patients request.

## 2018-06-05 ENCOUNTER — Other Ambulatory Visit: Payer: Self-pay

## 2018-06-05 ENCOUNTER — Encounter: Payer: Self-pay | Admitting: Internal Medicine

## 2018-06-05 ENCOUNTER — Ambulatory Visit (AMBULATORY_SURGERY_CENTER): Payer: Self-pay

## 2018-06-05 VITALS — Ht 67.0 in | Wt 243.4 lb

## 2018-06-05 DIAGNOSIS — R195 Other fecal abnormalities: Secondary | ICD-10-CM

## 2018-06-05 MED ORDER — PEG 3350-KCL-NA BICARB-NACL 420 G PO SOLR: 4000.0000 mL | Freq: Once | ORAL | 0 refills | Status: AC

## 2018-06-05 NOTE — Progress Notes (Signed)
No egg or soy allergy known to patient  No issues with past sedation with any surgeries  or procedures, no intubation problems  No diet pills per patient No home 02 use per patient  No blood thinners per patient  Pt denies issues with constipation  No A fib or A flutter  EMMI video sent to pt's e mail , pt declined    

## 2018-06-06 ENCOUNTER — Ambulatory Visit (INDEPENDENT_AMBULATORY_CARE_PROVIDER_SITE_OTHER): Payer: Medicare HMO | Admitting: *Deleted

## 2018-06-06 DIAGNOSIS — E538 Deficiency of other specified B group vitamins: Secondary | ICD-10-CM | POA: Diagnosis not present

## 2018-06-06 NOTE — Progress Notes (Signed)
Pt given Cyanocobalamin inj Tolerated well

## 2018-06-09 ENCOUNTER — Ambulatory Visit: Payer: Medicare HMO

## 2018-06-09 ENCOUNTER — Telehealth: Payer: Self-pay | Admitting: Family Medicine

## 2018-06-09 NOTE — Telephone Encounter (Signed)
Patient is calling to check on urology referral, please advise.

## 2018-06-11 DIAGNOSIS — N476 Balanoposthitis: Secondary | ICD-10-CM | POA: Diagnosis not present

## 2018-06-19 ENCOUNTER — Encounter: Payer: Self-pay | Admitting: Internal Medicine

## 2018-06-19 ENCOUNTER — Ambulatory Visit (AMBULATORY_SURGERY_CENTER): Payer: Medicare HMO | Admitting: Internal Medicine

## 2018-06-19 VITALS — BP 126/51 | HR 72 | Temp 98.6°F | Resp 22 | Ht 67.0 in | Wt 243.0 lb

## 2018-06-19 DIAGNOSIS — R195 Other fecal abnormalities: Secondary | ICD-10-CM | POA: Diagnosis not present

## 2018-06-19 DIAGNOSIS — D124 Benign neoplasm of descending colon: Secondary | ICD-10-CM | POA: Diagnosis not present

## 2018-06-19 DIAGNOSIS — D123 Benign neoplasm of transverse colon: Secondary | ICD-10-CM

## 2018-06-19 DIAGNOSIS — D122 Benign neoplasm of ascending colon: Secondary | ICD-10-CM | POA: Diagnosis not present

## 2018-06-19 DIAGNOSIS — Z1211 Encounter for screening for malignant neoplasm of colon: Secondary | ICD-10-CM | POA: Diagnosis not present

## 2018-06-19 MED ORDER — SODIUM CHLORIDE 0.9 % IV SOLN: 500.0000 mL | Freq: Once | INTRAVENOUS | Status: DC

## 2018-06-19 NOTE — Progress Notes (Signed)
Pt's states no medical or surgical changes since previsit or office visit. 

## 2018-06-19 NOTE — Op Note (Signed)
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 The patient's tolerance of                            previous anesthesia was also reviewed. The risks                            and benefits of the procedure and the sedation                            options and risks were discussed with the patient.                            All questions were answered, and informed consent                            was obtained. Prior Anticoagulants: The patient has                            taken no previous anticoagulant or antiplatelet                            agents. ASA Grade Assessment: III - A patient with                            severe systemic disease. After reviewing the risks                            and benefits, the patient was deemed in                            satisfactory condition to undergo the procedure.  After obtaining informed consent, the colonoscope                            was passed under direct vision. Throughout the                            procedure, the patient's blood pressure, pulse, and                            oxygen saturations were monitored continuously. The                            Colonoscope was introduced through the anus and                            advanced to the terminal  ileum. The colonoscopy was                            performed without difficulty. The patient tolerated                            the procedure well. The quality of the bowel                            preparation was good. The terminal ileum, ileocecal                            valve, appendiceal orifice, and rectum were                            photographed. Scope In: 3:11:04 PM Scope Out: 3:36:25 PM Scope Withdrawal Time: 0 hours 18 minutes 2 seconds  Total Procedure Duration: 0 hours 25 minutes 21 seconds  Findings:                 The neo-terminal ileum appeared normal.                           There was evidence of a prior end-to-side                            ileo-colonic anastomosis in the proximal ascending                            colon. This was patent and was characterized by                            healthy appearing mucosa.                           Three sessile polyps were found in the ascending                            colon. The polyps were 2 to 6 mm in size. These  polyps were removed with a cold snare. Resection                            and retrieval were complete.                           Four sessile polyps were found in the transverse                            colon. The polyps were 4 to 6 mm in size. These                            polyps were removed with a cold snare. Resection                            and retrieval were complete.                           Four sessile polyps were found in the descending                            colon. The polyps were 3 to 8 mm in size. These                            polyps were removed with a cold snare. Resection                            and retrieval were complete.                           Multiple small and large-mouthed diverticula were                            found in the sigmoid colon.                           External hemorrhoids were found during                             retroflexion. The hemorrhoids were small. Complications:            No immediate complications. Estimated Blood Loss:     Estimated blood loss was minimal. Impression:               - The examined portion of the ileum was normal.                           - Patent end-to-side ileo-colonic anastomosis,                            characterized by healthy appearing mucosa.                           - Three 2 to 6 mm polyps in the ascending colon,  removed with a cold snare. Resected and retrieved.                           - Four 4 to 6 mm polyps in the transverse colon,                            removed with a cold snare. Resected and retrieved.                           - Four 3 to 8 mm polyps in the descending colon,                            removed with a cold snare. Resected and retrieved.                           - Diverticulosis in the sigmoid colon.                           - Small external hemorrhoids. Recommendation:           - Patient has a contact number available for                            emergencies. The signs and symptoms of potential                            delayed complications were discussed with the                            patient. Return to normal activities tomorrow.                            Written discharge instructions were provided to the                            patient.                           - Resume previous diet.                           - Continue present medications.                           - Await pathology results.                           - Repeat colonoscopy is recommended for                            surveillance. The colonoscopy date will be                            determined after pathology results from today's  exam become available for review. Jerene Bears, MD 06/19/2018 3:43:36 PM This report has been signed electronically.

## 2018-06-19 NOTE — Progress Notes (Signed)
Called to room to assist during endoscopic procedure.  Patient ID and intended procedure confirmed with present staff. Received instructions for my participation in the procedure from the performing physician.  

## 2018-06-19 NOTE — Patient Instructions (Signed)
Handouts:  Polyps and Diverticulosis   YOU HAD AN ENDOSCOPIC PROCEDURE TODAY AT [facility]:   Refer to the procedure report that was given to you for any specific questions about what was found during the examination.  If the procedure report does not answer your questions, please call your gastroenterologist to clarify.  If you requested that your care partner not be given the details of your procedure findings, then the procedure report has been included in a sealed envelope for you to review at your convenience later.  YOU SHOULD EXPECT: Some feelings of bloating in the abdomen. Passage of more gas than usual.  Walking can help get rid of the air that was put into your GI tract during the procedure and reduce the bloating. If you had a lower endoscopy (such as a colonoscopy or flexible sigmoidoscopy) you may notice spotting of blood in your stool or on the toilet paper. If you underwent a bowel prep for your procedure, you may not have a normal bowel movement for a few days.  Please Note:  You might notice some irritation and congestion in your nose or some drainage.  This is from the oxygen used during your procedure.  There is no need for concern and it should clear up in a day or so.  SYMPTOMS TO REPORT IMMEDIATELY:   Following lower endoscopy (colonoscopy or flexible sigmoidoscopy):  Excessive amounts of blood in the stool  Significant tenderness or worsening of abdominal pains  Swelling of the abdomen that is new, acute  Fever of 100F or higher  For urgent or emergent issues, a gastroenterologist can be reached at any hour by calling [phone].   DIET:  We do recommend a small meal at first, but then you may proceed to your regular diet.  Drink plenty of fluids but you should avoid alcoholic beverages for 24 hours.  ACTIVITY:  You should plan to take it easy for the rest of today and you should NOT DRIVE or use heavy machinery until tomorrow (because of the  sedation medicines used during the test).    FOLLOW UP: Our staff will call the number listed on your records the next business day following your procedure to check on you and address any questions or concerns that you may have regarding the information given to you following your procedure. If we do not reach you, we will leave a message.  However, if you are feeling well and you are not experiencing any problems, there is no need to return our call.  We will assume that you have returned to your regular daily activities without incident.  If any biopsies were taken you will be contacted by phone or by letter within the next 1-3 weeks.  Please call us at [phone] if you have not heard about the biopsies in 3 weeks.    SIGNATURES/CONFIDENTIALITY: You and/or your care partner have signed paperwork which will be entered into your electronic medical record.  These signatures attest to the fact that that the information above on your After Visit Summary has been reviewed and is understood.  Full responsibility of the confidentiality of this discharge information lies with you and/or your care-partner.

## 2018-06-19 NOTE — Progress Notes (Signed)
Alert and oriented x3, pleased with MAC, report to RN  

## 2018-06-20 ENCOUNTER — Telehealth: Payer: Self-pay | Admitting: *Deleted

## 2018-06-20 NOTE — Telephone Encounter (Signed)
  Follow up Call-  Call back number 06/19/2018  Post procedure Call Back phone  # [phone]  Permission to leave phone message Yes  Some recent data might be hidden     Patient questions:  Do you have a fever, pain , or abdominal swelling? No. Pain Score  0 *  Have you tolerated food without any problems? Yes.    Have you been able to return to your normal activities? Yes.    Do you have any questions about your discharge instructions: Diet   No. Medications  No. Follow up visit  No.  Do you have questions or concerns about your Care? No.  Actions: * If pain score is 4 or above: No action needed, pain <4.

## 2018-06-23 ENCOUNTER — Other Ambulatory Visit: Payer: Self-pay | Admitting: Family Medicine

## 2018-06-26 ENCOUNTER — Encounter: Payer: Self-pay | Admitting: Internal Medicine

## 2018-06-26 NOTE — Telephone Encounter (Signed)
Pt was scheduled 06/11/18 with [facility]

## 2018-07-01 ENCOUNTER — Ambulatory Visit: Payer: Medicare HMO | Admitting: Internal Medicine

## 2018-07-07 ENCOUNTER — Ambulatory Visit (INDEPENDENT_AMBULATORY_CARE_PROVIDER_SITE_OTHER): Payer: Medicare HMO | Admitting: *Deleted

## 2018-07-07 DIAGNOSIS — E538 Deficiency of other specified B group vitamins: Secondary | ICD-10-CM | POA: Diagnosis not present

## 2018-07-07 NOTE — Progress Notes (Signed)
Pt given cyanocobalamin inj Tolerated well

## 2018-07-10 ENCOUNTER — Other Ambulatory Visit: Payer: Self-pay | Admitting: Family Medicine

## 2018-07-17 ENCOUNTER — Ambulatory Visit (INDEPENDENT_AMBULATORY_CARE_PROVIDER_SITE_OTHER): Payer: Medicare HMO | Admitting: Family Medicine

## 2018-07-17 ENCOUNTER — Encounter: Payer: Self-pay | Admitting: Family Medicine

## 2018-07-17 VITALS — BP 110/65 | HR 62 | Temp 97.0°F | Ht 67.0 in | Wt 242.0 lb

## 2018-07-17 DIAGNOSIS — E8881 Metabolic syndrome: Secondary | ICD-10-CM

## 2018-07-17 DIAGNOSIS — Z23 Encounter for immunization: Secondary | ICD-10-CM | POA: Diagnosis not present

## 2018-07-17 DIAGNOSIS — D696 Thrombocytopenia, unspecified: Secondary | ICD-10-CM

## 2018-07-17 DIAGNOSIS — E538 Deficiency of other specified B group vitamins: Secondary | ICD-10-CM

## 2018-07-17 DIAGNOSIS — I10 Essential (primary) hypertension: Secondary | ICD-10-CM

## 2018-07-17 DIAGNOSIS — N4829 Other inflammatory disorders of penis: Secondary | ICD-10-CM | POA: Diagnosis not present

## 2018-07-17 DIAGNOSIS — C61 Malignant neoplasm of prostate: Secondary | ICD-10-CM | POA: Diagnosis not present

## 2018-07-17 DIAGNOSIS — E559 Vitamin D deficiency, unspecified: Secondary | ICD-10-CM

## 2018-07-17 DIAGNOSIS — H6123 Impacted cerumen, bilateral: Secondary | ICD-10-CM

## 2018-07-17 DIAGNOSIS — E7800 Pure hypercholesterolemia, unspecified: Secondary | ICD-10-CM

## 2018-07-17 MED ORDER — CEPHALEXIN 500 MG PO CAPS: 500.0000 mg | ORAL_CAPSULE | Freq: Three times a day (TID) | ORAL | 0 refills | Status: DC

## 2018-07-17 NOTE — Progress Notes (Signed)
Subjective:    Patient ID: Luis Deleon, male    DOB: [birth year], 77 y.o.   MRN: [id number]  HPI Pt here for follow up and management of chronic medical problems which includes hypertension and hyperlipidemia. He is taking medication regularly.  The patient today has several complaints he has been lightheaded and short of breath on several occasions with increased heart rate.  This is happened for 2-3 mornings each week.  He did see the cardiologist wore a 30-day monitor and no abnormalities were found.  He also has penile irritation and saw the urologist back and recently.  He will get lab work today.  We will make sure the lab work includes a thyroid profile but that has not been done recently.  Also wonder about his blood pressure and if this could be running lower at home than we are getting it here.  The patient is also followed regularly by the gastroenterologist for his GI issues.  He did have multiple polyps and diverticulosis along with small external hemorrhoids.  He has a history of ileal Crohn's disease status post ileocecalectomy.  The pathology report from the colonoscopy had adenomas.  He did have an echocardiogram in March with an ejection fraction of 60 to 65%.  Patient does have another colonoscopy scheduled in 1 year from the previous one.  He continues to have these lightheaded shortness of breath episodes.  He does not check the blood pressure during these episodes.  He denies any caffeine even though these episodes occur shortly after drinking and eating his food in the morning.  He has lightheadedness shortness of breath and increased heart rate.  He also describes an irritation of the penis and has been using Mycolog cream all this and plans to see the urologist again next week.  He denies any chest pain.  He has shortness of breath with the episodes of lightheadedness.  Currently his bowel movements are formed and this is been better since he had the colonoscopy.  He has not seen  any blood.    Patient Active Problem List   Diagnosis Date Noted  . Hypokalemia 12/19/2017  . Tachyarrhythmia 12/19/2017  . Chest pain 12/19/2017  . Thrombocytopenia ([place]) 04/05/2015  . 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 Metabolic syndrome 56/86/1683  . DOE (dyspnea on exertion) 08/11/2013  . 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 POLYCYTHEMIA 11/03/2008  . LIVER FUNCTION TESTS, ABNORMAL, HX OF 09/24/2008  . Hyperlipidemia 07/28/2008  . GOUT, UNSPECIFIED 07/28/2008  . HTN (hypertension) 07/28/2008  . Crohn disease ([place]) 07/28/2008  . DIVERTICULOSIS, COLON 07/28/2008  . Prostate cancer ([place]) 07/28/2008  . SMALL BOWEL OBSTRUCTION, HX OF 07/28/2008  . NEPHROLITHIASIS, HX OF 07/28/2008   Outpatient Encounter Medications as of 07/17/2018  Medication Sig  . allopurinol (ZYLOPRIM) 100 MG tablet TAKE 2 TABLETS DAILY AS DIRECTED  . aspirin 81 MG EC tablet Take 81 mg by mouth daily.    Marland Kitchen atorvastatin (LIPITOR) 40 MG tablet TAKE 1 TABLET EVERY DAY AS DIRECTED  . Cholecalciferol (VITAMIN D3) 1000 UNITS CAPS Take 4 capsules by mouth every morning.   . fluticasone (FLONASE) 50 MCG/ACT nasal spray Place 2 sprays into both nostrils daily.  Marland Kitchen gemfibrozil (LOPID) 600 MG tablet TAKE 1 TABLET (600 MG TOTAL) BY MOUTH DAILY.  . hydrochlorothiazide (MICROZIDE) 12.5 MG capsule TAKE 1 TABLET (12.5 MG TOTAL) BY MOUTH DAILY. AS DIRECTED  . HYDROmorphone (DILAUDID) 2 MG tablet Take 1-2 capsules by mouth every 3 hours as needed  .  KLOR-CON M20 20 MEQ tablet TAKE 2 TABLETS (40 MEQ TOTAL) BY MOUTH DAILY.  Marland Kitchen losartan (COZAAR) 100 MG tablet TAKE 1/2 TABLET BY MOUTH EVERY DAY AS DIRECTED  . metoprolol tartrate (LOPRESSOR) 50 MG tablet Take 1 tablet (50 mg total) by mouth 2 (two) times daily.  Marland Kitchen nystatin-triamcinolone (MYCOLOG II) cream Apply 1 application topically 2 (two) times daily.  . OMEGA-3 1000 MG CAPS Take 2 capsules by mouth 2 (two) times daily.   Facility-Administered  Encounter Medications as of 07/17/2018  Medication  . cyanocobalamin ((VITAMIN B-12)) injection 1,000 mcg     Review of Systems  Constitutional: Negative.   HENT: Negative.   Eyes: Negative.   Respiratory: Negative.   Cardiovascular: Negative.   Gastrointestinal: Negative.   Endocrine: Negative.   Genitourinary: Positive for penile pain (foreskin irritation).  Musculoskeletal: Negative.   Skin: Negative.   Allergic/Immunologic: Negative.   Neurological: Positive for light-headedness (1-2 mornings a week after waking , elevated  HR and SOB - going on x 1 year (seen cardio)).  Hematological: Negative.   Psychiatric/Behavioral: Negative.        Objective:   Physical Exam  Constitutional: He is oriented to person, place, and time. He appears well-developed and well-nourished.  The patient is pleasant and doing well and all of his complaints and symptoms were reviewed thoroughly during the history process.  HENT:  Head: Normocephalic and atraumatic.  Right Ear: External ear normal.  Left Ear: External ear normal.  Nose: Nose normal.  Mouth/Throat: Oropharynx is clear and moist. No oropharyngeal exudate.  Eyes: Pupils are equal, round, and reactive to light. Conjunctivae and EOM are normal. Right eye exhibits no discharge. Left eye exhibits no discharge. No scleral icterus.  Neck: Normal range of motion. Neck supple. No thyromegaly present.  No bruits thyromegaly or anterior cervical adenopathy  Cardiovascular: Normal rate, regular rhythm, normal heart sounds and intact distal pulses.  No murmur heard. Heart is regular at 60/min without murmurs and with good pedal pulses  Pulmonary/Chest: Effort normal and breath sounds normal. No respiratory distress. He has no wheezes. He has no rales.  Clear anteriorly and posteriorly  Abdominal: Soft. Bowel sounds are normal. He exhibits no mass. There is no tenderness.  No abdominal tenderness masses or organ enlargement  Genitourinary: Penis  normal.  Genitourinary Comments: Foreskin cellulitis  Musculoskeletal: Normal range of motion. He exhibits no edema.  Lymphadenopathy:    He has no cervical adenopathy.  Neurological: He is alert and oriented to person, place, and time. He has normal reflexes. No cranial nerve deficit.  Reflexes were 2+ and equal bilaterally  Skin: Skin is warm and dry. No rash noted. There is erythema.  Foreskin cellulitis otherwise no rash.  Dry skin on both feet.  Psychiatric: He has a normal mood and affect. His behavior is normal. Judgment and thought content normal.  The patient's mood affect and behavior were all normal.  Nursing note and vitals reviewed.   BP 110/65 (BP Location: Left Arm)   Pulse 62   Temp (!) 97 F (36.1 C) (Oral)   Ht _0  (1.702 m)   Wt 242 lb (109.8 kg)   BMI 37.90 kg/m        Assessment & Plan:  1. Vitamin D deficiency -Continue current treatment pending results of lab work - CBC with Differential/Platelet - VITAMIN D 25 Hydroxy (Vit-D Deficiency, Fractures)  2. Pure hypercholesterolemia -Continue current treatment pending results of lab work - CBC with Differential/Platelet - Lipid panel  3. Essential hypertension -Blood pressure is good today and patient will continue current treatment but will check blood pressure readings during his dizzy spells that he has if he has any more of these. - BMP8+EGFR - CBC with Differential/Platelet - Hepatic function panel  4. Thrombocytopenia (HCC) -No bleeding or bruising issues noted - CBC with Differential/Platelet  5. Prostate cancer [facility]) -Follow-up with urology as planned - CBC with Differential/Platelet  6. Metabolic syndrome -Continue with aggressive therapeutic lifestyle changes including diet and exercise - BMP8+EGFR - CBC with Differential/Platelet  7. Vitamin B 12 deficiency -Continue with B12 injections - CBC with Differential/Platelet  8. B12 deficiency -Continue with current treatment  9.  Foreskin inflammation -Follow-up with urology.  Take Keflex 500 mg 3 times daily for 10 days to see if there is any response to this so when the urologist sees him he can make further determinations about any further treatment  10. Excessive cerumen in both ear canals -Ear irrigation today to remove cerumen  No orders of the defined types were placed in this encounter.  Patient Instructions                       Medicare Annual Wellness Visit  [place] [facility] and the medical providers at [facility] strive to bring you the best medical care.  In doing so we not only want to address your current medical conditions and concerns but also to detect new conditions early and prevent illness, disease and health-related problems.    Medicare offers a yearly Wellness Visit which allows our clinical staff to assess your need for preventative services including immunizations, lifestyle education, counseling to decrease risk of preventable diseases and screening for fall risk and other medical concerns.    This visit is provided free of charge (no copay) for all Medicare recipients. The clinical pharmacists at [facility] have begun to conduct these Wellness Visits which will also include a thorough review of all your medications.    As you primary medical provider recommend that you make an appointment for your Annual Wellness Visit if you have not done so already this year.  You may set up this appointment before you leave today or you may call back [phone]) and schedule an appointment.  Please make sure when you call that you mention that you are scheduling your Annual Wellness Visit with the clinical pharmacist so that the appointment may be made for the proper length of time.     Continue current medications. Continue good therapeutic lifestyle changes which include good diet and exercise. Fall precautions discussed with patient. If an FOBT was given today-  please return it to our front desk. If you are over 36 years old - you may need Prevnar 23 or the adult Pneumonia vaccine.  **Flu shots are available--- please call and schedule a FLU-CLINIC appointment**  After your visit with [place] today you will receive a survey in the mail or online from [employer] regarding your care with [place]. Please take a moment to fill this out. Your feedback is very important to [place] as you can help [place] better understand your patient needs as well as improve your experience and satisfaction. WE CARE ABOUT YOU!!!   Check blood pressure readings during these dizzy headed episodes that you are having Continue to reduce caffeine intake as much as possible Discontinue the Zantac and start taking Pepcid 40 as needed for heartburn issues. Also consider picking up some  align or the generic equivalent which is a probiotic and take 1 daily.  Arrie Senate MD

## 2018-07-17 NOTE — Patient Instructions (Addendum)
Medicare Annual Wellness Visit  [facility] and the medical providers at [facility] strive to bring you the best medical care.  In doing so we not only want to address your current medical conditions and concerns but also to detect new conditions early and prevent illness, disease and health-related problems.    Medicare offers a yearly Wellness Visit which allows our clinical staff to assess your need for preventative services including immunizations, lifestyle education, counseling to decrease risk of preventable diseases and screening for fall risk and other medical concerns.    This visit is provided free of charge (no copay) for all Medicare recipients. The clinical pharmacists at [facility] have begun to conduct these Wellness Visits which will also include a thorough review of all your medications.    As you primary medical provider recommend that you make an appointment for your Annual Wellness Visit if you have not done so already this year.  You may set up this appointment before you leave today or you may call back [phone]) and schedule an appointment.  Please make sure when you call that you mention that you are scheduling your Annual Wellness Visit with the clinical pharmacist so that the appointment may be made for the proper length of time.     Continue current medications. Continue good therapeutic lifestyle changes which include good diet and exercise. Fall precautions discussed with patient. If an FOBT was given today- please return it to our front desk. If you are over 2 years old - you may need Prevnar 68 or the adult Pneumonia vaccine.  **Flu shots are available--- please call and schedule a FLU-CLINIC appointment**  After your visit with [place] today you will receive a survey in the mail or online from [employer] regarding your care with [place]. Please take a moment to fill this out. Your feedback is very  important to [place] as you can help [place] better understand your patient needs as well as improve your experience and satisfaction. WE CARE ABOUT YOU!!!   Check blood pressure readings during these dizzy headed episodes that you are having Continue to reduce caffeine intake as much as possible Discontinue the Zantac and start taking Pepcid 40 as needed for heartburn issues. Also consider picking up some align or the generic equivalent which is a probiotic and take 1 daily.

## 2018-07-17 NOTE — Addendum Note (Signed)
Addended by: Zannie Cove on: 07/17/2018 11:49 AM   Modules accepted: Orders

## 2018-07-18 ENCOUNTER — Other Ambulatory Visit: Payer: Self-pay | Admitting: *Deleted

## 2018-07-18 ENCOUNTER — Telehealth: Payer: Self-pay | Admitting: Family Medicine

## 2018-07-18 LAB — CBC WITH DIFFERENTIAL/PLATELET
BASOS ABS: 0 10*3/uL (ref 0.0–0.2)
Basos: 1 %
EOS (ABSOLUTE): 0.1 10*3/uL (ref 0.0–0.4)
EOS: 1 %
HEMATOCRIT: 50.6 % (ref 37.5–51.0)
Hemoglobin: 17.1 g/dL (ref 13.0–17.7)
IMMATURE GRANULOCYTES: 1 %
Immature Grans (Abs): 0 10*3/uL (ref 0.0–0.1)
LYMPHS ABS: 1.8 10*3/uL (ref 0.7–3.1)
Lymphs: 21 %
MCH: 31.8 pg (ref 26.6–33.0)
MCHC: 33.8 g/dL (ref 31.5–35.7)
MCV: 94 fL (ref 79–97)
Monocytes Absolute: 0.8 10*3/uL (ref 0.1–0.9)
Monocytes: 9 %
NEUTROS PCT: 67 %
Neutrophils Absolute: 5.7 10*3/uL (ref 1.4–7.0)
Platelets: 157 10*3/uL (ref 150–450)
RBC: 5.38 x10E6/uL (ref 4.14–5.80)
RDW: 14.1 % (ref 12.3–15.4)
WBC: 8.4 10*3/uL (ref 3.4–10.8)

## 2018-07-18 LAB — BMP8+EGFR
BUN / CREAT RATIO: 14 (ref 10–24)
BUN: 17 mg/dL (ref 8–27)
CHLORIDE: 102 mmol/L (ref 96–106)
CO2: 27 mmol/L (ref 20–29)
CREATININE: 1.23 mg/dL (ref 0.76–1.27)
Calcium: 9.4 mg/dL (ref 8.6–10.2)
GFR calc Af Amer: 66 mL/min/{1.73_m2} (ref >59)
GFR calc non Af Amer: 57 mL/min/{1.73_m2} — ABNORMAL LOW (ref >59)
GLUCOSE: 80 mg/dL (ref 65–99)
Potassium: 4 mmol/L (ref 3.5–5.2)
Sodium: 143 mmol/L (ref 134–144)

## 2018-07-18 LAB — HEPATIC FUNCTION PANEL
ALK PHOS: 60 IU/L (ref 39–117)
ALT: 21 IU/L (ref 0–44)
AST: 28 IU/L (ref 0–40)
Albumin: 4 g/dL (ref 3.5–4.8)
BILIRUBIN, DIRECT: 0.3 mg/dL (ref 0.00–0.40)
Bilirubin Total: 1.7 mg/dL — ABNORMAL HIGH (ref 0.0–1.2)
Total Protein: 6.5 g/dL (ref 6.0–8.5)

## 2018-07-18 LAB — LIPID PANEL
CHOLESTEROL TOTAL: 129 mg/dL (ref 100–199)
Chol/HDL Ratio: 3.7 ratio (ref 0.0–5.0)
HDL: 35 mg/dL — AB (ref >39)
LDL CALC: 56 mg/dL (ref 0–99)
TRIGLYCERIDES: 190 mg/dL — AB (ref 0–149)
VLDL Cholesterol Cal: 38 mg/dL (ref 5–40)

## 2018-07-18 LAB — VITAMIN D 25 HYDROXY (VIT D DEFICIENCY, FRACTURES): Vit D, 25-Hydroxy: 47.7 ng/mL (ref 30.0–100.0)

## 2018-07-18 MED ORDER — ICOSAPENT ETHYL 1 G PO CAPS: 2.0000 | ORAL_CAPSULE | Freq: Two times a day (BID) | ORAL | 3 refills | Status: DC

## 2018-07-18 NOTE — Telephone Encounter (Signed)
Aware of labs

## 2018-07-22 DIAGNOSIS — N471 Phimosis: Secondary | ICD-10-CM | POA: Diagnosis not present

## 2018-08-06 ENCOUNTER — Ambulatory Visit (INDEPENDENT_AMBULATORY_CARE_PROVIDER_SITE_OTHER): Payer: Medicare HMO

## 2018-08-06 DIAGNOSIS — E538 Deficiency of other specified B group vitamins: Secondary | ICD-10-CM | POA: Diagnosis not present

## 2018-08-11 ENCOUNTER — Other Ambulatory Visit: Payer: Self-pay | Admitting: *Deleted

## 2018-08-11 MED ORDER — CEPHALEXIN 500 MG PO CAPS: 500.0000 mg | ORAL_CAPSULE | Freq: Three times a day (TID) | ORAL | 0 refills | Status: DC

## 2018-08-11 NOTE — Telephone Encounter (Signed)
Pt aware.

## 2018-08-11 NOTE — Telephone Encounter (Signed)
Pt came by today and states that he needs a refill on his keflex.  He states that the irritation on foreskin got So Much better with antibiotic and since completing it - the infection / irritation has came back. He did see Dr Enid Derry and has a F/U planned for 09/03/18. Dr Enid Derry said that he would need surgery.  Can we rf and he will call urology and line up surgery.

## 2018-08-21 ENCOUNTER — Other Ambulatory Visit: Payer: Self-pay | Admitting: Family Medicine

## 2018-08-28 DIAGNOSIS — Z0100 Encounter for examination of eyes and vision without abnormal findings: Secondary | ICD-10-CM | POA: Diagnosis not present

## 2018-09-03 DIAGNOSIS — N393 Stress incontinence (female) (male): Secondary | ICD-10-CM | POA: Diagnosis not present

## 2018-09-03 DIAGNOSIS — N471 Phimosis: Secondary | ICD-10-CM | POA: Diagnosis not present

## 2018-09-08 ENCOUNTER — Ambulatory Visit (INDEPENDENT_AMBULATORY_CARE_PROVIDER_SITE_OTHER): Payer: Medicare HMO | Admitting: *Deleted

## 2018-09-08 DIAGNOSIS — E538 Deficiency of other specified B group vitamins: Secondary | ICD-10-CM

## 2018-09-08 MED ORDER — CYANOCOBALAMIN 1000 MCG/ML IJ SOLN
1000.0000 ug | INTRAMUSCULAR | Status: DC
  Administered 2018-09-08 – 2018-12-01 (×2): 1000 ug via INTRAMUSCULAR

## 2018-09-08 NOTE — Progress Notes (Signed)
Pt given cyanocobalamin inj Tolerated well

## 2018-09-10 ENCOUNTER — Other Ambulatory Visit: Payer: Self-pay | Admitting: Family Medicine

## 2018-09-12 ENCOUNTER — Other Ambulatory Visit: Payer: Self-pay | Admitting: Family Medicine

## 2018-09-17 ENCOUNTER — Other Ambulatory Visit: Payer: Self-pay | Admitting: Urology

## 2018-09-22 ENCOUNTER — Other Ambulatory Visit: Payer: Self-pay | Admitting: Family Medicine

## 2018-10-01 ENCOUNTER — Encounter (HOSPITAL_BASED_OUTPATIENT_CLINIC_OR_DEPARTMENT_OTHER): Payer: Self-pay | Admitting: *Deleted

## 2018-10-01 ENCOUNTER — Other Ambulatory Visit: Payer: Self-pay

## 2018-10-01 NOTE — Progress Notes (Signed)
Spoke with patient via telephone for pre op interview. NPO after MN. Patient to take lopressor and cozaar with a sip of water AM of surgery. Patient to arrive at 0530. Will need ISTAT 8.

## 2018-10-06 ENCOUNTER — Ambulatory Visit: Payer: Medicare HMO

## 2018-10-10 ENCOUNTER — Encounter (HOSPITAL_BASED_OUTPATIENT_CLINIC_OR_DEPARTMENT_OTHER)
Admission: RE | Disposition: A | Discharge status: Home / Self Care | Payer: Self-pay | Source: Home / Self Care | Attending: Urology

## 2018-10-10 ENCOUNTER — Ambulatory Visit (HOSPITAL_BASED_OUTPATIENT_CLINIC_OR_DEPARTMENT_OTHER): Payer: Medicare HMO | Admitting: Anesthesiology

## 2018-10-10 ENCOUNTER — Ambulatory Visit (HOSPITAL_BASED_OUTPATIENT_CLINIC_OR_DEPARTMENT_OTHER)
Admission: RE | Admit: 2018-10-10 | Discharge: 2018-10-10 | Disposition: A | Discharge status: Home / Self Care | Payer: Medicare HMO | Attending: Urology | Admitting: Urology

## 2018-10-10 ENCOUNTER — Encounter (HOSPITAL_BASED_OUTPATIENT_CLINIC_OR_DEPARTMENT_OTHER): Payer: Self-pay | Admitting: *Deleted

## 2018-10-10 DIAGNOSIS — Z8546 Personal history of malignant neoplasm of prostate: Secondary | ICD-10-CM | POA: Insufficient documentation

## 2018-10-10 DIAGNOSIS — Z7982 Long term (current) use of aspirin: Secondary | ICD-10-CM | POA: Insufficient documentation

## 2018-10-10 DIAGNOSIS — R300 Dysuria: Secondary | ICD-10-CM | POA: Insufficient documentation

## 2018-10-10 DIAGNOSIS — Z87891 Personal history of nicotine dependence: Secondary | ICD-10-CM | POA: Insufficient documentation

## 2018-10-10 DIAGNOSIS — N393 Stress incontinence (female) (male): Secondary | ICD-10-CM

## 2018-10-10 DIAGNOSIS — E785 Hyperlipidemia, unspecified: Secondary | ICD-10-CM | POA: Diagnosis not present

## 2018-10-10 DIAGNOSIS — E559 Vitamin D deficiency, unspecified: Secondary | ICD-10-CM | POA: Diagnosis not present

## 2018-10-10 DIAGNOSIS — I10 Essential (primary) hypertension: Secondary | ICD-10-CM | POA: Insufficient documentation

## 2018-10-10 DIAGNOSIS — Z79899 Other long term (current) drug therapy: Secondary | ICD-10-CM | POA: Diagnosis not present

## 2018-10-10 DIAGNOSIS — R32 Unspecified urinary incontinence: Secondary | ICD-10-CM | POA: Diagnosis not present

## 2018-10-10 DIAGNOSIS — N471 Phimosis: Secondary | ICD-10-CM | POA: Diagnosis not present

## 2018-10-10 LAB — POCT I-STAT, CHEM 8
BUN: 22 mg/dL (ref 8–23)
Calcium, Ion: 1.31 mmol/L (ref 1.15–1.40)
Chloride: 112 mmol/L — ABNORMAL HIGH (ref 98–111)
Creatinine, Ser: 1.2 mg/dL (ref 0.61–1.24)
Glucose, Bld: 104 mg/dL — ABNORMAL HIGH (ref 70–99)
HEMATOCRIT: 48 % (ref 39.0–52.0)
Hemoglobin: 16.3 g/dL (ref 13.0–17.0)
Potassium: 3.6 mmol/L (ref 3.5–5.1)
Sodium: 144 mmol/L (ref 135–145)
TCO2: 23 mmol/L (ref 22–32)

## 2018-10-10 SURGERY — DORSAL SLIT, PREPUCE
Anesthesia: General | Site: Penis

## 2018-10-10 MED ORDER — LIDOCAINE-EPINEPHRINE 1 %-1:100000 IJ SOLN
INTRAMUSCULAR | Status: DC | PRN
  Administered 2018-10-10: 4 mL

## 2018-10-10 MED ORDER — KETOROLAC TROMETHAMINE 30 MG/ML IJ SOLN
INTRAMUSCULAR | Status: DC | PRN
  Administered 2018-10-10: 30 mg via INTRAVENOUS

## 2018-10-10 MED ORDER — FENTANYL CITRATE (PF) 100 MCG/2ML IJ SOLN
25.0000 ug | INTRAMUSCULAR | Status: DC | PRN
  Filled 2018-10-10: qty 1

## 2018-10-10 MED ORDER — DEXAMETHASONE SODIUM PHOSPHATE 10 MG/ML IJ SOLN
INTRAMUSCULAR | Status: AC
  Filled 2018-10-10: qty 1

## 2018-10-10 MED ORDER — EPHEDRINE 5 MG/ML INJ
INTRAVENOUS | Status: AC
  Filled 2018-10-10: qty 10

## 2018-10-10 MED ORDER — LIDOCAINE 2% (20 MG/ML) 5 ML SYRINGE
INTRAMUSCULAR | Status: AC
  Filled 2018-10-10: qty 5

## 2018-10-10 MED ORDER — ONDANSETRON HCL 4 MG/2ML IJ SOLN
INTRAMUSCULAR | Status: DC | PRN
  Administered 2018-10-10: 4 mg via INTRAVENOUS

## 2018-10-10 MED ORDER — BACITRACIN-NEOMYCIN-POLYMYXIN 400-5-5000 EX OINT
TOPICAL_OINTMENT | CUTANEOUS | Status: DC | PRN
  Administered 2018-10-10: 1 via TOPICAL

## 2018-10-10 MED ORDER — STERILE WATER FOR IRRIGATION IR SOLN
Status: DC | PRN
  Administered 2018-10-10: 3000 mL via INTRAVESICAL

## 2018-10-10 MED ORDER — PROPOFOL 10 MG/ML IV BOLUS
INTRAVENOUS | Status: DC | PRN
  Administered 2018-10-10: 50 mg via INTRAVENOUS
  Administered 2018-10-10: 150 mg via INTRAVENOUS

## 2018-10-10 MED ORDER — FENTANYL CITRATE (PF) 100 MCG/2ML IJ SOLN
INTRAMUSCULAR | Status: AC
  Filled 2018-10-10: qty 2

## 2018-10-10 MED ORDER — DEXAMETHASONE SODIUM PHOSPHATE 4 MG/ML IJ SOLN
INTRAMUSCULAR | Status: DC | PRN
  Administered 2018-10-10: 5 mg via INTRAVENOUS

## 2018-10-10 MED ORDER — LIDOCAINE HCL URETHRAL/MUCOSAL 2 % EX GEL
CUTANEOUS | Status: DC | PRN
  Administered 2018-10-10: 1

## 2018-10-10 MED ORDER — LACTATED RINGERS IV SOLN
INTRAVENOUS | Status: DC
  Administered 2018-10-10: 07:00:00 via INTRAVENOUS
  Filled 2018-10-10: qty 1000

## 2018-10-10 MED ORDER — EPHEDRINE SULFATE 50 MG/ML IJ SOLN
INTRAMUSCULAR | Status: DC | PRN
  Administered 2018-10-10 (×2): 10 mg via INTRAVENOUS

## 2018-10-10 MED ORDER — KETOROLAC TROMETHAMINE 30 MG/ML IJ SOLN
INTRAMUSCULAR | Status: AC
  Filled 2018-10-10: qty 1

## 2018-10-10 MED ORDER — PROPOFOL 10 MG/ML IV BOLUS
INTRAVENOUS | Status: AC
  Filled 2018-10-10: qty 20

## 2018-10-10 MED ORDER — LIDOCAINE HCL (CARDIAC) PF 100 MG/5ML IV SOSY
PREFILLED_SYRINGE | INTRAVENOUS | Status: DC | PRN
  Administered 2018-10-10: 80 mg via INTRAVENOUS

## 2018-10-10 MED ORDER — CEFAZOLIN SODIUM-DEXTROSE 2-4 GM/100ML-% IV SOLN
INTRAVENOUS | Status: AC
  Filled 2018-10-10: qty 100

## 2018-10-10 MED ORDER — LACTATED RINGERS IV SOLN
INTRAVENOUS | Status: DC
  Filled 2018-10-10: qty 1000

## 2018-10-10 MED ORDER — MEPERIDINE HCL 25 MG/ML IJ SOLN
6.2500 mg | INTRAMUSCULAR | Status: DC | PRN
  Filled 2018-10-10: qty 1

## 2018-10-10 MED ORDER — ONDANSETRON HCL 4 MG/2ML IJ SOLN
INTRAMUSCULAR | Status: AC
  Filled 2018-10-10: qty 2

## 2018-10-10 MED ORDER — FENTANYL CITRATE (PF) 100 MCG/2ML IJ SOLN
INTRAMUSCULAR | Status: DC | PRN
  Administered 2018-10-10 (×8): 12.5 ug via INTRAVENOUS

## 2018-10-10 MED ORDER — CEFAZOLIN SODIUM-DEXTROSE 2-4 GM/100ML-% IV SOLN
2.0000 g | Freq: Once | INTRAVENOUS | Status: AC
  Administered 2018-10-10: 2 g via INTRAVENOUS
  Filled 2018-10-10: qty 100

## 2018-10-10 MED ORDER — PROMETHAZINE HCL 25 MG/ML IJ SOLN
6.2500 mg | INTRAMUSCULAR | Status: DC | PRN
  Filled 2018-10-10: qty 1

## 2018-10-10 SURGICAL SUPPLY — 21 items
BAG DRAIN URO-CYSTO SKYTR STRL (DRAIN) ×2 IMPLANT
BALLN NEPHROSTOMY (BALLOONS)
BALLOON NEPHROSTOMY (BALLOONS) IMPLANT
CLOTH BEACON ORANGE TIMEOUT ST (SAFETY) ×4 IMPLANT
DRSG TELFA 3X8 NADH (GAUZE/BANDAGES/DRESSINGS) ×4 IMPLANT
ELECT REM PT RETURN 9FT ADLT (ELECTROSURGICAL)
ELECTRODE REM PT RTRN 9FT ADLT (ELECTROSURGICAL) IMPLANT
GAUZE SPONGE 4X4 12PLY STRL (GAUZE/BANDAGES/DRESSINGS) ×2 IMPLANT
GLOVE BIO SURGEON STRL SZ7.5 (GLOVE) ×2 IMPLANT
GOWN STRL REUS W/ TWL LRG LVL3 (GOWN DISPOSABLE) ×1 IMPLANT
GOWN STRL REUS W/ TWL XL LVL3 (GOWN DISPOSABLE) ×1 IMPLANT
GOWN STRL REUS W/TWL LRG LVL3 (GOWN DISPOSABLE) ×1
GOWN STRL REUS W/TWL XL LVL3 (GOWN DISPOSABLE) ×1
GUIDEWIRE ANG ZIPWIRE 038X150 (WIRE) IMPLANT
GUIDEWIRE STR DUAL SENSOR (WIRE) IMPLANT
KIT TURNOVER CYSTO (KITS) ×2 IMPLANT
MANIFOLD NEPTUNE II (INSTRUMENTS) IMPLANT
PACK CYSTO (CUSTOM PROCEDURE TRAY) ×2 IMPLANT
SUT CHROMIC 3 0 PS 2 (SUTURE) ×2 IMPLANT
SYRINGE IRR TOOMEY STRL 70CC (SYRINGE) IMPLANT
TUBE CONNECTING 12X1/4 (SUCTIONS) IMPLANT

## 2018-10-10 NOTE — Anesthesia Preprocedure Evaluation (Addendum)
Anesthesia Evaluation  Patient identified by MRN, date of birth, ID band Patient awake    Reviewed: Allergy & Precautions, NPO status , Patient's Chart, lab work & pertinent test results, reviewed documented beta blocker date and time   Airway Mallampati: I  TM Distance: >3 FB Neck ROM: Full    Dental  (+) Upper Dentures, Dental Advisory Given   Pulmonary former smoker,    breath sounds clear to auscultation       Cardiovascular hypertension, Pt. on medications and Pt. on home beta blockers  Rhythm:Regular Rate:Normal     Neuro/Psych negative neurological ROS     GI/Hepatic negative GI ROS, Neg liver ROS,   Endo/Other  negative endocrine ROS  Renal/GU Renal InsufficiencyRenal disease     Musculoskeletal  (+) Arthritis ,   Abdominal (+) + obese,   Peds  Hematology negative hematology ROS (+)   Anesthesia Other Findings   Reproductive/Obstetrics                            Lab Results  Component Value Date   WBC 8.4 07/17/2018   HGB 16.3 10/10/2018   HCT 48.0 10/10/2018   MCV 94 07/17/2018   PLT 157 07/17/2018   Lab Results  Component Value Date   CREATININE 1.20 10/10/2018   BUN 22 10/10/2018   NA 144 10/10/2018   K 3.6 10/10/2018   CL 112 (H) 10/10/2018   CO2 27 07/17/2018   No results found for: INR, PROTIME  Myocardial Perfusion:  The left ventricular ejection fraction is mildly decreased (45-54%).  Nuclear stress EF: 51%.  There was no ST segment deviation noted during stress.  There is a small defect of mild severity present in the basal inferior and mid inferior location. The defect is partially reversible. In setting of normal LVF, this likely represents variations in diaphragmatic attenuation but cannot rule out small infarct with peri infarct ischemia.  There is transient ischemic dilatation with a TID of which could indicate multivessel CAD.  This is an  intermediate risk study due to increased TID. Marland Kitchen  Anesthesia Physical Anesthesia Plan  ASA: III  Anesthesia Plan: General   Post-op Pain Management:    Induction: Intravenous  PONV Risk Score and Plan: 3 and Ondansetron, Midazolam and Dexamethasone  Airway Management Planned: LMA  Additional Equipment: None  Intra-op Plan:   Post-operative Plan: Extubation in OR  Informed Consent: I have reviewed the patients History and Physical, chart, labs and discussed the procedure including the risks, benefits and alternatives for the proposed anesthesia with the patient or authorized representative who has indicated his/her understanding and acceptance.   Dental advisory given  Plan Discussed with: CRNA  Anesthesia Plan Comments:        Anesthesia Quick Evaluation

## 2018-10-10 NOTE — H&P (Addendum)
H&P  Chief Complaint: phimosis, incontinence  History of Present Illness:   Luis Deleon is a 77 year-old male established patient who is here for trouble with rollling back his foreskin.  He has had difficulties with rolling his foreskin back for 2 months. He was last able to easily roll his foreskin back approximately 04/14/2018. His symptoms have gotten worse over the last year.   He does have dysuria. He has had inflammation of the head of his penis. He has had to use creams on the head of his penis.   He is uncircumcised and he pulled the foreskin back developed a paraphimosis. He finally reduced it. After that episode he's had redness and burning of the foreskin. He took abx. He tried nystatin ointment and an OTC ointment.   AUASS = 1, noc x 1. SHIM = 4.   He has a history of prostatectomy around 2006 for prostate cancer. PSA in 2014 was undetectable.   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 His foreskin is better and inflammation has improved, but he still cannot retract the foreskin.   He presents today for dorsal incision of foreskin and cystoscopy. He has post-prostatectomy incontinence. No dysuria or hematuria. No fever or congestion. He wears 1-2 ppd and they are moist.     Past Medical History:  Diagnosis Date  . Abnormal LFTs (liver function tests)   . Arthritis   . Crohn disease ([place])   . Diverticulosis   . Fatty liver   . Gout   . Hepatic cyst   . Hyperlipemia   . Hyperplastic colon polyp 2012  . Hypertension   . Nephrolithiasis   . Polycythemia   . Prostate cancer ([place])    8 or 9 years  . Small bowel obstruction ([place]) 2003  . Vitamin D deficiency    Past Surgical History:  Procedure Laterality Date  . APPENDECTOMY  2003  . BACTERIAL OVERGROWTH TEST N/A 06/15/2016   Procedure: BACTERIAL OVERGROWTH TEST;  Surgeon: Md Physician Gastroenterology, MD;  Location: [facility];  Service: Gastroenterology;  Laterality: N/A;  . EXPLORATORY LAPAROTOMY W/ BOWEL  RESECTION  2003  . LITHOTRIPSY  1980  . PROSTATECTOMY    . TONSILLECTOMY    . VENTRAL HERNIA REPAIR     2 times    Home Medications:  Facility-Administered Medications Prior to Admission  Medication Dose Route Frequency Provider Last Rate Last Dose  . cyanocobalamin ((VITAMIN B-12)) injection 1,000 mcg  1,000 mcg Intramuscular Q30 days Chipper Herb, MD   1,000 mcg at 09/08/18 1102   Medications Prior to Admission  Medication Sig Dispense Refill Last Dose  . allopurinol (ZYLOPRIM) 100 MG tablet TAKE 2 TABLETS DAILY AS DIRECTED 180 tablet 0 10/09/2018 at Unknown time  . aspirin 81 MG EC tablet Take 81 mg by mouth daily.     09/26/2018  . atorvastatin (LIPITOR) 40 MG tablet TAKE 1 TABLET EVERY DAY AS DIRECTED 90 tablet 0 Past Week at Unknown time  . Cholecalciferol (VITAMIN D3) 1000 UNITS CAPS Take 4 capsules by mouth every morning.    09/26/2018  . gemfibrozil (LOPID) 600 MG tablet TAKE 1 TABLET (600 MG TOTAL) BY MOUTH DAILY. 90 tablet 0 10/08/2018  . hydrochlorothiazide (MICROZIDE) 12.5 MG capsule TAKE 1 TABLET (12.5 MG TOTAL) BY MOUTH DAILY. AS DIRECTED 90 capsule 0 10/09/2018 at Unknown time  . KLOR-CON M20 20 MEQ tablet TAKE 2 TABLETS (40 MEQ TOTAL) BY MOUTH DAILY. 90 tablet 1 10/09/2018 at Unknown time  . losartan (  COZAAR) 100 MG tablet TAKE 1/2 TABLET BY MOUTH EVERY DAY AS DIRECTED   10/10/2018 at 0400  . metoprolol tartrate (LOPRESSOR) 50 MG tablet Take 1 tablet (50 mg total) by mouth 2 (two) times daily. 180 tablet 3 10/10/2018 at 0400  . nystatin-triamcinolone (MYCOLOG II) cream Apply 1 application topically 2 (two) times daily. 30 g 3 Past Month at Unknown time  . OMEGA-3 1000 MG CAPS Take 2 capsules by mouth 2 (two) times daily.   09/26/2018  . HYDROmorphone (DILAUDID) 2 MG tablet Take 1-2 capsules by mouth every 3 hours as needed 30 tablet 0 Unknown at Unknown time  . Icosapent Ethyl (VASCEPA) 1 g CAPS Take 2 capsules (2 g total) by mouth 2 (two) times daily. 120 capsule 3  Unknown at Unknown time   Allergies:  Allergies  Allergen Reactions  . Colchicine Diarrhea, Nausea And Vomiting and Other (See Comments)    REACTION: nausea, diarrhea and interactions with 65m    Family History  Problem Relation Age of Onset  . Heart disease Mother   . Cervical cancer Mother   . Heart disease Father   . Heart failure Father   . Dislocations Daughter 8       Bilateral knee  . Heart disease Maternal Uncle   . Colon cancer Neg Hx   . Esophageal cancer Neg Hx   . Rectal cancer Neg Hx   . Stomach cancer Neg Hx    Social History:  reports that he quit smoking about 27 years ago. His smoking use included cigarettes. He has a 38.00 pack-year smoking history. His smokeless tobacco use includes chew. He reports that he does not drink alcohol or use drugs.  ROS: A complete review of systems was performed.  All systems are negative except for pertinent findings as noted. Review of Systems  All other systems reviewed and are negative.    Physical Exam:  Vital signs in last 24 hours: Temp:  [98.2 F (36.8 C)] 98.2 F (36.8 C) (12/27 0535) Pulse Rate:  [67] 67 (12/27 0535) Resp:  [16] 16 (12/27 0535) BP: (129)/(82) 129/82 (12/27 0535) SpO2:  [95 %] 95 % (12/27 0535) Weight:  [111.9 kg] 111.9 kg (12/27 0535) General:  Alert and oriented, No acute distress HEENT: Normocephalic, atraumatic Cardiovascular: Regular rate and rhythm Lungs: Regular rate and effort Abdomen: Soft, nontender, nondistended, no abdominal masses Back: No CVA tenderness Extremities: No edema Neurologic: Grossly intact  Laboratory Data:  Results for orders placed or performed during the hospital encounter of 10/10/18 (from the past 24 hour(s))  I-STAT, chem 8     Status: Abnormal   Collection Time: 10/10/18  6:25 AM  Result Value Ref Range   Sodium 144 135 - 145 mmol/L   Potassium 3.6 3.5 - 5.1 mmol/L   Chloride 112 (H) 98 - 111 mmol/L   BUN 22 8 - 23 mg/dL   Creatinine, Ser 1.20 0.61 -  1.24 mg/dL   Glucose, Bld 104 (H) 70 - 99 mg/dL   Calcium, Ion 1.31 1.15 - 1.40 mmol/L   TCO2 23 22 - 32 mmol/L   Hemoglobin 16.3 13.0 - 17.0 g/dL   HCT 48.0 39.0 - 52.0 %   No results found for this or any previous visit (from the past 240 hour(s)). Creatinine: Recent Labs    10/10/18 0625  CREATININE 1.20    Impression/Assessment:  -phimosis -incontinence   Plan:  I discussed with the patient the nature, potential benefits, risks and alternatives to cystoscopy  and dorsal incision, including side effects of the proposed treatment, the likelihood of the patient achieving the goals of the procedure, and any potential problems that might occur during the procedure or recuperation. All questions answered. Patient elects to proceed. Discussed possibility of needing a biopsy or circumcision and post-op foley.    Festus Aloe 10/10/2018, 7:28 AM

## 2018-10-10 NOTE — Anesthesia Postprocedure Evaluation (Signed)
Anesthesia Post Note  Patient: Luis Deleon  Procedure(s) Performed: DORSAL SLIT WITH CYSTOSCOPY (N/A Penis)     Patient location during evaluation: PACU Anesthesia Type: General Level of consciousness: awake and alert Pain management: pain level controlled Vital Signs Assessment: post-procedure vital signs reviewed and stable Respiratory status: spontaneous breathing, nonlabored ventilation, respiratory function stable and patient connected to nasal cannula oxygen Cardiovascular status: blood pressure returned to baseline and stable Postop Assessment: no apparent nausea or vomiting Anesthetic complications: no    Last Vitals:  Vitals:   10/10/18 0915 10/10/18 0930  BP: 118/72 114/73  Pulse: 73 73  Resp: 13 17  Temp:    SpO2: 94% 93%    Last Pain:  Vitals:   10/10/18 0915  TempSrc:   PainSc: 0-No pain                 Effie Berkshire

## 2018-10-10 NOTE — Op Note (Signed)
Pre-operative diagnosis: phimosis, incontinence Post-operative diagnosis: same  Procedure: Dorsal foreskin incision, flexible cystoscopy  Surgeon: Junious Silk  Anesthesia: General  Indication for procedure: 77 year old white male with a history of prostatectomy and incontinence.  He also had bothersome phimosis, but we were concerned about a buried penis and he elected for a dorsal incision.  Findings: The foreskin appeared normal without mass or lesion.  Glans appeared normal.  On cystoscopy the urethra was normal, prostate surgically absent, bladder neck patent, bladder unremarkable.  No stone or foreign body in the bladder.  Mucosa appeared normal.  Ureteral orifice ease in their normal orthotopic position with clear reflux.  Description of procedure: After consent was obtained patient brought to the operating room.  After adequate anesthesia was placed in lithotomy position prepped and draped in usual sterile fashion.  A timeout was performed to confirm the patient and procedure.  1% lidocaine with epinephrine was used to infiltrate the dorsal foreskin.  It was crossclamped and incised back to about a centimeter from the coronal sulcus.  The foreskin and glans were inspected.  They were cleaned with Betadine and irrigated copiously.  I then ran the incisions closed with 3-0 chromic.  This allowed the foreskin to pull back easily.  Flexible cystoscopy was performed.  Lidocaine jelly instilled per urethra and some antibiotic ointment applied to the foreskin.  Telfa covered the incision and fluffs.  He was awakened and taken recovery room in stable condition.  Complications: None  Blood loss: Minimal  Specimens: None  Drains: None  Disposition: Patient stable to PACU

## 2018-10-10 NOTE — Anesthesia Procedure Notes (Signed)
Procedure Name: LMA Insertion Date/Time: 10/10/2018 7:49 AM Performed by: Justice Rocher, CRNA Pre-anesthesia Checklist: Patient identified, Emergency Drugs available, Suction available and Patient being monitored Patient Re-evaluated:Patient Re-evaluated prior to induction Oxygen Delivery Method: Circle system utilized Preoxygenation: Pre-oxygenation with 100% oxygen Induction Type: IV induction Ventilation: Mask ventilation without difficulty LMA: LMA inserted LMA Size: 5.0 Number of attempts: 1 Airway Equipment and Method: Bite block Placement Confirmation: positive ETCO2 and breath sounds checked- equal and bilateral Tube secured with: Tape Dental Injury: Teeth and Oropharynx as per pre-operative assessment

## 2018-10-10 NOTE — Transfer of Care (Signed)
Immediate Anesthesia Transfer of Care Note  Patient: Luis Deleon  Procedure(s) Performed: Procedure(s) (LRB): DORSAL SLIT WITH CYSTOSCOPY (N/A)  Patient Location: PACU  Anesthesia Type: General  Level of Consciousness: awake, sedated, patient cooperative and responds to stimulation  Airway & Oxygen Therapy: Patient Spontanous Breathing and Patient connected to [place] oxygen  Post-op Assessment: Report given to PACU RN, Post -op Vital signs reviewed and stable and Patient moving all extremities  Post vital signs: Reviewed and stable  Complications: No apparent anesthesia complications

## 2018-10-10 NOTE — Discharge Instructions (Signed)
Dorsal Foreskin Incision, Care After This sheet gives you information about how to care for yourself after your procedure. Your doctor may also give you more specific instructions. If you have problems or questions, contact your doctor. Follow these instructions at home: Cut care   Follow instructions from your doctor about how to take care of your cut from surgery (incision). Make sure you: ? Wash your hands with soap and water before you change your bandage (dressing). If you cannot use soap and water, use hand sanitizer. ? Change your bandage as told by your doctor. ? Leave stitches (sutures), skin glue, or skin tape (adhesive) strips in place. They may need to stay in place for 2 weeks or longer. If tape strips get loose and curl up, you may trim the loose edges. Do not remove tape strips completely unless your doctor says it is okay  Check your cut area every day for signs of infection. Check for: ? More redness, swelling, or pain. ? More fluid or blood. ? Warmth. ? Pus or a bad smell. Bathing  Do not take baths, swim, or use a hot tub until your doctor says it is okay.   You may shower tomorrow, 10/11/2018.   Do not get your cut area wet during the 24 hours after the procedure, or as long as told by your doctor.  When you shower, do not rub the cut area. Gently pat it dry. General instructions  Avoid heavy lifting, contact sports, biking, or swimming until your doctor says it is okay.  Do not have sex until your doctor says it is okay.  Take or apply over-the-counter and prescription medicines only as told by your doctor.  Keep all follow-up visits as told by your doctor. This is important. Contact a doctor if:  Medicine does not help your pain.  You have more redness, swelling, or pain around your cut.  You have more fluid or blood coming from your cut.  Your cut feels warm to the touch.  You have pus or a bad smell coming from your cut.  You have a fever. Get  help right away if:  You cannot pee (urinate).  It hurts to pee.  Your pain is not helped by medicines.  There is redness, swelling, and soreness that spreads up the shaft of your penis, your thighs, or your lower belly (abdomen).  You have bleeding that does not stop when you press on it. Summary  Follow instructions from your doctor about how to take care of your cut from surgery (incision).  Check your cut area every day for signs of infection.  Do not have sex until your doctor says it is okay.   Cystoscopy  Cystoscopy is a procedure that is used to help diagnose and sometimes treat conditions that affect that lower urinary tract. The lower urinary tract includes the bladder and the tube that drains urine from the bladder out of the body (urethra). Cystoscopy is performed with a thin, tube-shaped instrument with a light and camera at the end (cystoscope). The cystoscope may be hard (rigid) or flexible, depending on the goal of the procedure.The cystoscope is inserted through the urethra, into the bladder. Cystoscopy may be recommended if you have:  Urinary tractinfections that keep coming back (recurring).  Blood in the urine (hematuria).  Loss of bladder control (urinary incontinence) or an overactive bladder.  Unusual cells found in a urine sample.  A blockage in the urethra.  Painful urination.  An abnormality in  the bladder found during an intravenous pyelogram (IVP) or CT scan. Cystoscopy may also be done to remove a sample of tissue to be examined under a microscope (biopsy). Tell a health care provider about:  Any allergies you have.  All medicines you are taking, including vitamins, herbs, eye drops, creams, and over-the-counter medicines.  Any problems you or family members have had with anesthetic medicines.  Any blood disorders you have.  Any surgeries you have had.  Any medical conditions you have.  Whether you are pregnant or may be  pregnant. What are the risks? Generally, this is a safe procedure. However, problems may occur, including:  Infection.  Bleeding.  Allergic reactions to medicines.  Damage to other structures or organs. What happens before the procedure?  Ask your health care provider about: ? Changing or stopping your regular medicines. This is especially important if you are taking diabetes medicines or blood thinners. ? Taking medicines such as aspirin and ibuprofen. These medicines can thin your blood. Do not take these medicines before your procedure if your health care provider instructs you not to.  Follow instructions from your health care provider about eating or drinking restrictions.  You may be given antibiotic medicine to help prevent infection.  You may have an exam or testing, such as X-rays of the bladder, urethra, or kidneys.  You may have urine tests to check for signs of infection.  Plan to have someone take you home after the procedure. What happens during the procedure?  To reduce your risk of infection,your health care team will wash or sanitize their hands.  You will be given one or more of the following: ? A medicine to help you relax (sedative). ? A medicine to numb the area (local anesthetic).  The area around the opening of your urethra will be cleaned.  The cystoscope will be passed through your urethra into your bladder.  Germ-free (sterile)fluid will flow through the cystoscope to fill your bladder. The fluid will stretch your bladder so that your surgeon can clearly examine your bladder walls.  The cystoscope will be removed and your bladder will be emptied. The procedure may vary among health care providers and hospitals. What happens after the procedure?  You may have some soreness or pain in your abdomen and urethra. Medicines will be available to help you.  You may have some blood in your urine.  Do not drive for 24 hours if you received a  sedative. This information is not intended to replace advice given to you by your health care provider. Make sure you discuss any questions you have with your health care provider. Document Released: 09/28/2000 Document Revised: 07/12/2017 Document Reviewed: 08/18/2015 Elsevier Interactive Patient Education  2019 [facility] Care Instructions  Activity: Get plenty of rest for the remainder of the day. A responsible individual must stay with you for 24 hours following the procedure.  For the next 24 hours, DO NOT: -Drive a car -Paediatric nurse -Drink alcoholic beverages -Take any medication unless instructed by your physician -Make any legal decisions or sign important papers.  Meals: Start with liquid foods such as gelatin or soup. Progress to regular foods as tolerated. Avoid greasy, spicy, heavy foods. If nausea and/or vomiting occur, drink only clear liquids until the nausea and/or vomiting subsides. Call your physician if vomiting continues.  Special Instructions/Symptoms: Your throat may feel dry or sore from the anesthesia or the breathing tube placed in your throat during surgery. If this  causes discomfort, gargle with warm salt water. The discomfort should disappear within 24 hours.  If you had a scopolamine patch placed behind your ear for the management of post- operative nausea and/or vomiting:  1. The medication in the patch is effective for 72 hours, after which it should be removed.  Wrap patch in a tissue and discard in the trash. Wash hands thoroughly with soap and water. 2. You may remove the patch earlier than 72 hours if you experience unpleasant side effects which may include dry mouth, dizziness or visual disturbances. 3. Avoid touching the patch. Wash your hands with soap and water after contact with the patch.

## 2018-10-23 DIAGNOSIS — N471 Phimosis: Secondary | ICD-10-CM | POA: Diagnosis not present

## 2018-11-12 ENCOUNTER — Other Ambulatory Visit: Payer: Medicare HMO

## 2018-11-20 ENCOUNTER — Encounter: Payer: Self-pay | Admitting: Family Medicine

## 2018-11-20 ENCOUNTER — Ambulatory Visit (INDEPENDENT_AMBULATORY_CARE_PROVIDER_SITE_OTHER): Payer: Medicare HMO | Admitting: Family Medicine

## 2018-11-20 VITALS — BP 111/65 | HR 63 | Temp 97.9°F | Ht 67.0 in | Wt 246.0 lb

## 2018-11-20 DIAGNOSIS — E559 Vitamin D deficiency, unspecified: Secondary | ICD-10-CM | POA: Diagnosis not present

## 2018-11-20 DIAGNOSIS — I10 Essential (primary) hypertension: Secondary | ICD-10-CM | POA: Diagnosis not present

## 2018-11-20 DIAGNOSIS — E538 Deficiency of other specified B group vitamins: Secondary | ICD-10-CM

## 2018-11-20 DIAGNOSIS — C61 Malignant neoplasm of prostate: Secondary | ICD-10-CM

## 2018-11-20 DIAGNOSIS — M109 Gout, unspecified: Secondary | ICD-10-CM

## 2018-11-20 DIAGNOSIS — D696 Thrombocytopenia, unspecified: Secondary | ICD-10-CM

## 2018-11-20 DIAGNOSIS — H6123 Impacted cerumen, bilateral: Secondary | ICD-10-CM | POA: Diagnosis not present

## 2018-11-20 DIAGNOSIS — E7800 Pure hypercholesterolemia, unspecified: Secondary | ICD-10-CM

## 2018-11-20 NOTE — Progress Notes (Signed)
Subjective:    Patient ID: Luis Deleon, male    DOB: [birth year], 78 y.o.   MRN: [id number]  HPI  Pt here for follow up and management of chronic medical problems which includes hypertension and hyperlipidemia. He is taking medication regularly.  Patient had a recent circumcision because of phimosis by the urologist but has not had a rectal exam in a while to check his prostate so we will go ahead and do that today.  The surgery was done by the urologist on December 27.  The patient has a history of abdominal aortic atherosclerosis Crohn's disease hypertension hyperlipidemia prostate cancer and thrombocytopenia.  He also takes allopurinol for gout.  He is on B12 replacement.  The patient is pleasant and doing well and recovering well from his recent phimosis repair.  He has follow-up visits to continue with the urologist.  Today he denies any chest pain pressure tightness or shortness of breath.  He denies any trouble with his stomach as far as blood in the stool black tarry bowel movements diarrhea or trouble swallowing.  He does have episodes every 6 to 7 months of severe abdominal pain and he did discuss this with the gastroenterologist who thinks that this is due to scar tissue and his intestinal tract and for now he will continue to monitor this and work through it this usually lasts for 8 to 12 hours when these episodes occur.  He is doing better with passing his water and he did have a cystoscopy at the time of the phimosis repair and everything appeared good to the urologist on the cystoscopy.  He is up-to-date on his eye exams.    Patient Active Problem List   Diagnosis Date Noted  . Hypokalemia 12/19/2017  . Tachyarrhythmia 12/19/2017  . Chest pain 12/19/2017  . Thrombocytopenia ([place]) 04/05/2015  . Abdominal aortic atherosclerosis ([place]) 11/25/2014  . Metabolic syndrome 38/25/0539  . DOE (dyspnea on exertion) 08/11/2013  . 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 POLYCYTHEMIA  11/03/2008  . LIVER FUNCTION TESTS, ABNORMAL, HX OF 09/24/2008  . Hyperlipidemia 07/28/2008  . GOUT, UNSPECIFIED 07/28/2008  . HTN (hypertension) 07/28/2008  . Crohn disease ([place]) 07/28/2008  . DIVERTICULOSIS, COLON 07/28/2008  . Prostate cancer ([place]) 07/28/2008  . SMALL BOWEL OBSTRUCTION, HX OF 07/28/2008  . NEPHROLITHIASIS, HX OF 07/28/2008   Outpatient Encounter Medications as of 11/20/2018  Medication Sig  . allopurinol (ZYLOPRIM) 100 MG tablet TAKE 2 TABLETS DAILY AS DIRECTED  . aspirin 81 MG EC tablet Take 81 mg by mouth daily.    Marland Kitchen atorvastatin (LIPITOR) 40 MG tablet TAKE 1 TABLET EVERY DAY AS DIRECTED  . Cholecalciferol (VITAMIN D3) 1000 UNITS CAPS Take 4 capsules by mouth every morning.   Marland Kitchen gemfibrozil (LOPID) 600 MG tablet TAKE 1 TABLET (600 MG TOTAL) BY MOUTH DAILY.  . hydrochlorothiazide (MICROZIDE) 12.5 MG capsule TAKE 1 TABLET (12.5 MG TOTAL) BY MOUTH DAILY. AS DIRECTED  . HYDROmorphone (DILAUDID) 2 MG tablet Take 1-2 capsules by mouth every 3 hours as needed  . KLOR-CON M20 20 MEQ tablet TAKE 2 TABLETS (40 MEQ TOTAL) BY MOUTH DAILY.  Marland Kitchen losartan (COZAAR) 100 MG tablet TAKE 1/2 TABLET BY MOUTH EVERY DAY AS DIRECTED  . metoprolol tartrate (LOPRESSOR) 50 MG tablet Take 1 tablet (50 mg total) by mouth 2 (two) times daily.  Marland Kitchen nystatin-triamcinolone (MYCOLOG II) cream Apply 1 application topically 2 (two) times daily.  . OMEGA-3 1000 MG CAPS Take 2 capsules by mouth 2 (two) times daily.  . [  DISCONTINUED] Icosapent Ethyl (VASCEPA) 1 g CAPS Take 2 capsules (2 g total) by mouth 2 (two) times daily.   Facility-Administered Encounter Medications as of 11/20/2018  Medication  . cyanocobalamin ((VITAMIN B-12)) injection 1,000 mcg     Review of Systems  Constitutional: Negative.   HENT: Negative.   Eyes: Negative.   Respiratory: Negative.   Cardiovascular: Negative.   Gastrointestinal: Negative.   Endocrine: Negative.   Genitourinary: Negative.   Musculoskeletal: Negative.     Skin: Negative.   Allergic/Immunologic: Negative.   Neurological: Negative.   Hematological: Negative.   Psychiatric/Behavioral: Negative.        Objective:   Physical Exam Vitals signs and nursing note reviewed.  Constitutional:      General: He is not in acute distress.    Appearance: Normal appearance. He is well-developed. He is obese. He is not ill-appearing.     Comments: The patient is pleasant and relaxed and doing well following his surgery.  HENT:     Head: Normocephalic and atraumatic.     Right Ear: Tympanic membrane, ear canal and external ear normal. There is no impacted cerumen.     Left Ear: Tympanic membrane, ear canal and external ear normal. There is no impacted cerumen.     Nose: Nose normal. No congestion or rhinorrhea.     Mouth/Throat:     Mouth: Mucous membranes are moist.     Pharynx: Oropharynx is clear. No oropharyngeal exudate.  Eyes:     General: No scleral icterus.       Right eye: No discharge.        Left eye: No discharge.     Extraocular Movements: Extraocular movements intact.     Conjunctiva/sclera: Conjunctivae normal.     Pupils: Pupils are equal, round, and reactive to light.     Comments: Patient is up-to-date on his eye exam  Neck:     Musculoskeletal: Normal range of motion and neck supple.     Thyroid: No thyromegaly.     Vascular: No carotid bruit.     Trachea: No tracheal deviation.     Comments: No bruits thyromegaly or anterior cervical adenopathy Cardiovascular:     Rate and Rhythm: Normal rate and regular rhythm.     Heart sounds: Normal heart sounds. No murmur.     Comments: The heart is regular at 60/min with diminished pedal pulses left foot but present on the right foot and varicose veins on both feet Pulmonary:     Effort: Pulmonary effort is normal.     Breath sounds: Normal breath sounds. No wheezing or rales.     Comments: Lungs are clear anteriorly and posteriorly no axillary adenopathy chest wall masses or  tenderness Chest:     Chest wall: No tenderness.  Abdominal:     General: Bowel sounds are normal.     Palpations: Abdomen is soft. There is no mass.     Tenderness: There is abdominal tenderness. There is no guarding.     Comments: Abdominal obesity with generalized upper abdominal tenderness.  No liver or spleen enlargement no bruits and no inguinal adenopathy  Genitourinary:    Penis: Normal.      Scrotum/Testes: Normal.     Rectum: Normal.     Comments: Recent circumcision for phimosis appears to be healing well with no drainage still some edema.  The prostate exam was negative and the vault was empty of any masses.  The rectal exam was negative.  External genitalia were  within normal limits other than the healing circumcision scar. Musculoskeletal: Normal range of motion.        General: No tenderness.     Right lower leg: No edema.     Left lower leg: No edema.  Lymphadenopathy:     Cervical: No cervical adenopathy.  Skin:    General: Skin is warm and dry.     Findings: No rash.  Neurological:     General: No focal deficit present.     Mental Status: He is alert and oriented to person, place, and time. Mental status is at baseline.     Cranial Nerves: No cranial nerve deficit.     Deep Tendon Reflexes: Reflexes are normal and symmetric. Reflexes normal.  Psychiatric:        Mood and Affect: Mood normal.        Behavior: Behavior normal.        Thought Content: Thought content normal.        Judgment: Judgment normal.     Comments: Mood affect and behavior for this patient were normal for him.     BP 111/65 (BP Location: Left Arm)   Pulse 63   Temp 97.9 F (36.6 C) (Oral)   Ht 5' 7"  (1.702 m)   Wt 246 lb (111.6 kg)   BMI 38.53 kg/m        Assessment & Plan:  1. Vitamin D deficiency -Continue with vitamin D replacement pending results of lab work - VITAMIN D 25 Hydroxy (Vit-D Deficiency, Fractures)  2. Pure hypercholesterolemia -Continue with cholesterol  treatment pending results of lab work - Lipid panel  3. Essential hypertension -The blood pressure is good and he will continue with current treatment - BMP8+EGFR - Hepatic function panel  4. Thrombocytopenia (HCC) -No issues or complaints with bleeding or increased bruising and history today. - CBC with Differential/Platelet  5. Prostate cancer [facility]) -Rectal exam was negative patient just recovering from circumcision because of phimosis. - PSA, total and free  6. Vitamin B 12 deficiency -Continue with B12 replacement pending results of lab work - Vitamin B12  7. Gout, unspecified cause, unspecified chronicity, unspecified site -Continue with allopurinol pending results of lab work - Uric acid  8.  Morbid obesity -Patient's BMI was elevated above 35 with 2 or more comorbid conditions and he will pursue as aggressive therapeutic lifestyle changes as possible to achieve weight loss.  9.  Cerumen ear canal, left -Ear irrigation to remove cerumen  Patient Instructions                       Medicare Annual Wellness Visit  [facility] and the medical providers at [facility] strive to bring you the best medical care.  In doing so we not only want to address your current medical conditions and concerns but also to detect new conditions early and prevent illness, disease and health-related problems.    Medicare offers a yearly Wellness Visit which allows our clinical staff to assess your need for preventative services including immunizations, lifestyle education, counseling to decrease risk of preventable diseases and screening for fall risk and other medical concerns.    This visit is provided free of charge (no copay) for all Medicare recipients. The clinical pharmacists at [facility] have begun to conduct these Wellness Visits which will also include a thorough review of all your medications.    As you primary medical provider recommend  that you make  an appointment for your Annual Wellness Visit if you have not done so already this year.  You may set up this appointment before you leave today or you may call back ([phone]) and schedule an appointment.  Please make sure when you call that you mention that you are scheduling your Annual Wellness Visit with the clinical pharmacist so that the appointment may be made for the proper length of time.     Continue current medications. Continue good therapeutic lifestyle changes which include good diet and exercise. Fall precautions discussed with patient. If an FOBT was given today- please return it to our front desk. If you are over 84 years old - you may need Prevnar 75 or the adult Pneumonia vaccine.  **Flu shots are available--- please call and schedule a FLU-CLINIC appointment**  After your visit with [place] today you will receive a survey in the mail or online from [employer] regarding your care with [place]. Please take a moment to fill this out. Your feedback is very important to [place] as you can help [place] better understand your patient needs as well as improve your experience and satisfaction. WE CARE ABOUT YOU!!!   Follow-up with gastroenterology as planned for repeat colonoscopy in the fall because of multiple polyps Follow-up with urology as planned Continue to drink plenty of fluids and stay well-hydrated Always be attuned to whether certain foods that you have eaten contribute to the episodes of it abdominal pain that he is having when he has this pain.  Arrie Senate MD

## 2018-11-20 NOTE — Patient Instructions (Addendum)
Medicare Annual Wellness Visit  [facility] and the medical providers at [facility] strive to bring you the best medical care.  In doing so we not only want to address your current medical conditions and concerns but also to detect new conditions early and prevent illness, disease and health-related problems.    Medicare offers a yearly Wellness Visit which allows our clinical staff to assess your need for preventative services including immunizations, lifestyle education, counseling to decrease risk of preventable diseases and screening for fall risk and other medical concerns.    This visit is provided free of charge (no copay) for all Medicare recipients. The clinical pharmacists at [facility] have begun to conduct these Wellness Visits which will also include a thorough review of all your medications.    As you primary medical provider recommend that you make an appointment for your Annual Wellness Visit if you have not done so already this year.  You may set up this appointment before you leave today or you may call back ([phone]) and schedule an appointment.  Please make sure when you call that you mention that you are scheduling your Annual Wellness Visit with the clinical pharmacist so that the appointment may be made for the proper length of time.     Continue current medications. Continue good therapeutic lifestyle changes which include good diet and exercise. Fall precautions discussed with patient. If an FOBT was given today- please return it to our front desk. If you are over 26 years old - you may need Prevnar 24 or the adult Pneumonia vaccine.  **Flu shots are available--- please call and schedule a FLU-CLINIC appointment**  After your visit with [place] today you will receive a survey in the mail or online from [employer] regarding your care with [place]. Please take a moment to fill this out. Your feedback is very  important to [place] as you can help [place] better understand your patient needs as well as improve your experience and satisfaction. WE CARE ABOUT YOU!!!   Follow-up with gastroenterology as planned for repeat colonoscopy in the fall because of multiple polyps Follow-up with urology as planned Continue to drink plenty of fluids and stay well-hydrated Always be attuned to whether certain foods that you have eaten contribute to the episodes of it abdominal pain that he is having when he has this pain.

## 2018-11-21 ENCOUNTER — Other Ambulatory Visit: Payer: Self-pay | Admitting: Family Medicine

## 2018-11-21 LAB — VITAMIN B12: Vitamin B-12: 427 pg/mL (ref 232–1245)

## 2018-11-21 LAB — CBC WITH DIFFERENTIAL/PLATELET
Basophils Absolute: 0.1 10*3/uL (ref 0.0–0.2)
Basos: 1 %
EOS (ABSOLUTE): 0.2 10*3/uL (ref 0.0–0.4)
Eos: 3 %
Hematocrit: 53.3 % — ABNORMAL HIGH (ref 37.5–51.0)
Hemoglobin: 17.6 g/dL (ref 13.0–17.7)
Immature Grans (Abs): 0 10*3/uL (ref 0.0–0.1)
Immature Granulocytes: 1 %
LYMPHS: 21 %
Lymphocytes Absolute: 1.8 10*3/uL (ref 0.7–3.1)
MCH: 31.5 pg (ref 26.6–33.0)
MCHC: 33 g/dL (ref 31.5–35.7)
MCV: 96 fL (ref 79–97)
Monocytes Absolute: 0.9 10*3/uL (ref 0.1–0.9)
Monocytes: 11 %
Neutrophils Absolute: 5.4 10*3/uL (ref 1.4–7.0)
Neutrophils: 63 %
Platelets: 175 10*3/uL (ref 150–450)
RBC: 5.58 x10E6/uL (ref 4.14–5.80)
RDW: 14.5 % (ref 11.6–15.4)
WBC: 8.4 10*3/uL (ref 3.4–10.8)

## 2018-11-21 LAB — BMP8+EGFR
BUN/Creatinine Ratio: 13 (ref 10–24)
BUN: 16 mg/dL (ref 8–27)
CO2: 24 mmol/L (ref 20–29)
Calcium: 9.4 mg/dL (ref 8.6–10.2)
Chloride: 104 mmol/L (ref 96–106)
Creatinine, Ser: 1.27 mg/dL (ref 0.76–1.27)
GFR calc Af Amer: 63 mL/min/{1.73_m2} (ref >59)
GFR calc non Af Amer: 55 mL/min/{1.73_m2} — ABNORMAL LOW (ref >59)
Glucose: 85 mg/dL (ref 65–99)
Potassium: 4.2 mmol/L (ref 3.5–5.2)
Sodium: 143 mmol/L (ref 134–144)

## 2018-11-21 LAB — LIPID PANEL
Chol/HDL Ratio: 3.6 ratio (ref 0.0–5.0)
Cholesterol, Total: 135 mg/dL (ref 100–199)
HDL: 37 mg/dL — ABNORMAL LOW (ref >39)
LDL Calculated: 77 mg/dL (ref 0–99)
Triglycerides: 103 mg/dL (ref 0–149)
VLDL CHOLESTEROL CAL: 21 mg/dL (ref 5–40)

## 2018-11-21 LAB — VITAMIN D 25 HYDROXY (VIT D DEFICIENCY, FRACTURES): Vit D, 25-Hydroxy: 49.2 ng/mL (ref 30.0–100.0)

## 2018-11-21 LAB — HEPATIC FUNCTION PANEL
ALT: 22 IU/L (ref 0–44)
AST: 26 IU/L (ref 0–40)
Albumin: 3.9 g/dL (ref 3.7–4.7)
Alkaline Phosphatase: 66 IU/L (ref 39–117)
Bilirubin Total: 1 mg/dL (ref 0.0–1.2)
Bilirubin, Direct: 0.22 mg/dL (ref 0.00–0.40)
Total Protein: 6.3 g/dL (ref 6.0–8.5)

## 2018-11-21 LAB — PSA, TOTAL AND FREE
PSA, Free: <0.02 ng/mL
Prostate Specific Ag, Serum: <0.1 ng/mL (ref 0.0–4.0)

## 2018-11-21 LAB — URIC ACID: Uric Acid: 4.7 mg/dL (ref 3.7–8.6)

## 2018-11-25 ENCOUNTER — Other Ambulatory Visit: Payer: Self-pay | Admitting: Family Medicine

## 2018-12-01 ENCOUNTER — Ambulatory Visit (INDEPENDENT_AMBULATORY_CARE_PROVIDER_SITE_OTHER): Payer: Medicare HMO | Admitting: *Deleted

## 2018-12-01 DIAGNOSIS — E538 Deficiency of other specified B group vitamins: Secondary | ICD-10-CM | POA: Diagnosis not present

## 2018-12-01 NOTE — Progress Notes (Signed)
Pt given Cyanocobalamin inj tolerated well

## 2018-12-07 ENCOUNTER — Other Ambulatory Visit: Payer: Self-pay | Admitting: Family Medicine

## 2018-12-08 ENCOUNTER — Other Ambulatory Visit: Payer: Self-pay | Admitting: Family Medicine

## 2018-12-21 ENCOUNTER — Other Ambulatory Visit: Payer: Self-pay | Admitting: Family Medicine

## 2018-12-30 ENCOUNTER — Ambulatory Visit: Payer: Medicare HMO

## 2018-12-31 ENCOUNTER — Telehealth: Payer: Self-pay | Admitting: Family Medicine

## 2018-12-31 MED ORDER — CEPHALEXIN 500 MG PO CAPS: 500.0000 mg | ORAL_CAPSULE | Freq: Three times a day (TID) | ORAL | 0 refills | Status: DC

## 2018-12-31 NOTE — Telephone Encounter (Signed)
Keflex 500  3 times daily for 10 days.  If the problem gets worse he should come in and be checked.

## 2018-12-31 NOTE — Telephone Encounter (Signed)
Please advise if medication will be sent in.

## 2018-12-31 NOTE — Telephone Encounter (Signed)
Sent and wife aware

## 2019-01-30 DIAGNOSIS — N476 Balanoposthitis: Secondary | ICD-10-CM | POA: Diagnosis not present

## 2019-02-12 ENCOUNTER — Telehealth: Payer: Self-pay | Admitting: Cardiovascular Disease

## 2019-02-12 NOTE — Telephone Encounter (Signed)
Tele visit/ home phone/ consent/ my chart/ pre reg completed

## 2019-02-13 ENCOUNTER — Telehealth: Payer: Medicare HMO | Admitting: Cardiovascular Disease

## 2019-02-19 ENCOUNTER — Other Ambulatory Visit: Payer: Self-pay | Admitting: Family Medicine

## 2019-02-23 ENCOUNTER — Telehealth: Payer: Self-pay | Admitting: Family Medicine

## 2019-02-23 NOTE — Telephone Encounter (Signed)
Can this be switched to something that may be cheaper?

## 2019-02-24 MED ORDER — POTASSIUM CHLORIDE 20 MEQ PO PACK: 40.0000 meq | PACK | Freq: Once | ORAL | 3 refills | Status: DC

## 2019-02-24 NOTE — Telephone Encounter (Signed)
New rx sent to pharmacy

## 2019-02-25 ENCOUNTER — Telehealth: Payer: Self-pay | Admitting: Cardiovascular Disease

## 2019-02-25 NOTE — Telephone Encounter (Signed)
M[facility], call home phone, consent, pre reg complete 02/25/19 AF

## 2019-02-26 ENCOUNTER — Telehealth (INDEPENDENT_AMBULATORY_CARE_PROVIDER_SITE_OTHER): Payer: Medicare HMO | Admitting: Cardiovascular Disease

## 2019-02-26 DIAGNOSIS — I10 Essential (primary) hypertension: Secondary | ICD-10-CM

## 2019-02-26 DIAGNOSIS — E782 Mixed hyperlipidemia: Secondary | ICD-10-CM | POA: Diagnosis not present

## 2019-02-26 DIAGNOSIS — R000 Tachycardia, unspecified: Secondary | ICD-10-CM | POA: Diagnosis not present

## 2019-02-26 NOTE — Patient Instructions (Signed)
Medication Instructions:   Your physician recommends that you continue on your current medications as directed. Please refer to the Current Medication list given to you today.  Labwork:  NONE  Testing/Procedures:  NONE  Follow-Up:  Your physician recommends that you schedule a follow-up appointment in: 1 year with Dr. Gwenlyn Found. You will receive a reminder letter in the mail in about 10 months reminding you to call and schedule your appointment. If you don't receive this letter, please contact our office.  Any Other Special Instructions Will Be Listed Below (If Applicable).  If you need a refill on your cardiac medications before your next appointment, please call your pharmacy.

## 2019-02-26 NOTE — Progress Notes (Signed)
Virtual Visit via Telephone Note   This visit type was conducted due to national recommendations for restrictions regarding the COVID-19 Pandemic (e.g. social distancing) in an effort to limit this patient's exposure and mitigate transmission in our community.  Due to his co-morbid illnesses, this patient is at least at moderate risk for complications without adequate follow up.  This format is felt to be most appropriate for this patient at this time.  The patient did not have access to video technology/had technical difficulties with video requiring transitioning to audio format only (telephone).  All issues noted in this document were discussed and addressed.  No physical exam could be performed with this format.  Please refer to the patient's chart for his  consent to telehealth for [facility].   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 moderate to severely overweight married Caucasian male father of one,and father to 2 grandchildren who is retired from [employer]. He was a Engineer, structural in [place]. He was referred by Dr. Morrie Sheldon, his primary care physician, for cardiovascular evaluation because of a recent episode of PSVT.   I last saw him in the office 01/01/2018.  He has a history of treated hypertension and hyperlipidemia. He's never had a heart attack or stroke. He smoked remotely having stopped 25 years ago. He does have a history of Crohn's disease and prostate cancer status post radical prostatectomy remotely by Dr. Jeffie Pollock at which time he did have an episode of PSVT.he was admitted to [facility] on [facility]/19[facility]17 with what appears  to be PSVT which broke. His rate was 160. He had positive below troponins. A 2-D echo was normal.  Since I saw him a year ago he is remained stable.  He denies chest pain or shortness of breath.  He does get and work in his yard and walks without limitation or symptoms.  He said no recurrent episodes of PSVT. The patient does not have symptoms concerning for COVID-19 infection (fever, chills, cough, or new shortness of breath).    Past Medical History:  Diagnosis Date  . Abnormal LFTs (liver function tests)   . Arthritis   . Crohn disease ([place])   . Diverticulosis   . Fatty liver   . Gout   . Hepatic cyst   . Hyperlipemia   . Hyperplastic colon polyp 2012  . Hypertension   . Nephrolithiasis   . Polycythemia   . Prostate cancer ([place])    8 or 9 years  . Small bowel obstruction ([place]) 2003  . Vitamin D deficiency    Past Surgical History:  Procedure Laterality Date  . APPENDECTOMY  2003  . BACTERIAL OVERGROWTH TEST N/A 06/15/2016   Procedure: BACTERIAL OVERGROWTH TEST;  Surgeon: Md Physician Gastroenterology, MD;  Location: [facility] SUITE;  Service: Gastroenterology;  Laterality: N/A;  . EXPLORATORY LAPAROTOMY W/ BOWEL RESECTION  2003  . LITHOTRIPSY  1980  . PROSTATECTOMY    . TONSILLECTOMY    . VENTRAL HERNIA REPAIR     2 times     No outpatient medications have been marked as taking for the 02/26/19 encounter (Appointment) with  Lorretta Harp, MD.   Current Facility-Administered Medications for the 02/26/19 encounter (Appointment) with Lorretta Harp, MD  Medication  . cyanocobalamin ((VITAMIN B-12)) injection 1,000 mcg     Allergies:   Colchicine   Social History   Tobacco Use  . Smoking status: Former Smoker    Packs/day: 1.00    Years: 38.00    Pack years: 38.00    Types: Cigarettes    Last attempt to quit: 03/19/1991    Years since quitting: 27.9  . Smokeless tobacco: Current User    Types: Chew  Substance Use Topics  . Alcohol use: No  . Drug use: No      Family Hx: The patient's family history includes Cervical cancer in his mother; Dislocations (age of onset: 55) in his daughter; Heart disease in his father, maternal uncle, and mother; Heart failure in his father. There is no history of Colon cancer, Esophageal cancer, Rectal cancer, or Stomach cancer.  ROS:   Please see the history of present illness.     All other systems reviewed and are negative.   Prior CV studies:   The following studies were reviewed today:  None  Labs/Other Tests and Data Reviewed:    EKG:  No ECG reviewed.  Recent Labs: 11/20/2018: ALT 22; BUN 16; Creatinine, Ser 1.27; Hemoglobin 17.6; Platelets 175; Potassium 4.2; Sodium 143   Recent Lipid Panel Lab Results  Component Value Date/Time   CHOL 135 11/20/2018 10:58 AM   CHOL 128 09/02/2013 08:18 AM   TRIG 103 11/20/2018 10:58 AM   TRIG 202 (H) 09/28/2016 09:13 AM   TRIG 205 (H) 09/02/2013 08:18 AM   HDL 37 (L) 11/20/2018 10:58 AM   HDL 37 (L) 09/28/2016 09:13 AM   HDL 37 (L) 09/02/2013 08:18 AM   CHOLHDL 3.6 11/20/2018 10:58 AM   LDLCALC 77 11/20/2018 10:58 AM   LDLCALC 62 07/19/2014 08:32 AM   LDLCALC 50 09/02/2013 08:18 AM    Wt Readings from Last 3 Encounters:  11/20/18 246 lb (111.6 kg)  10/10/18 246 lb 12.8 oz (111.9 kg)  07/17/18 242 lb (109.8 kg)     Objective:    Vital Signs:  There were no vitals taken for this visit.   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 Essential hypertension- history of essential hypertension blood pressure measured by the patient at home of 133/76 with a pulse of 62.  He is on hydrochlorothiazide, metoprolol and losartan 3. Hyperlipidemia- history of hyperlipidemia on atorvastatin and Lopid with fasting lipid profile performed 11/20/2018 revealing total cholesterol 135, LDL 77 and HDL of 37.  COVID-19 Education:  The signs and symptoms of COVID-19 were discussed with the patient and how to seek care for testing (follow up with PCP or arrange E-visit).  The importance of social distancing was discussed today.  Time:   Today, I have spent 4 minutes with the patient with telehealth technology discussing the above problems.     Medication Adjustments/Labs and Tests Ordered: Current medicines are reviewed at length with the patient today.  Concerns regarding medicines are outlined above.   Tests Ordered: No orders of the defined types were placed in this encounter.   Medication Changes: No orders of the defined types were placed in this encounter.   Disposition:  Follow up in 1 year(s)  Signed, Quay Burow,  MD  02/26/2019 12:29 PM    [facility]

## 2019-02-26 NOTE — Telephone Encounter (Signed)
Disregard opened in error

## 2019-03-18 DIAGNOSIS — N476 Balanoposthitis: Secondary | ICD-10-CM | POA: Diagnosis not present

## 2019-03-20 ENCOUNTER — Other Ambulatory Visit: Payer: Self-pay | Admitting: Family Medicine

## 2019-03-30 ENCOUNTER — Other Ambulatory Visit: Payer: Self-pay | Admitting: Family Medicine

## 2019-03-31 ENCOUNTER — Telehealth: Payer: Self-pay | Admitting: Family Medicine

## 2019-03-31 MED ORDER — LOSARTAN POTASSIUM 100 MG PO TABS: ORAL_TABLET | ORAL | 1 refills | Status: DC

## 2019-03-31 MED ORDER — ATORVASTATIN CALCIUM 40 MG PO TABS: 40.0000 mg | ORAL_TABLET | Freq: Every day | ORAL | 1 refills | Status: DC

## 2019-03-31 NOTE — Telephone Encounter (Signed)
meds refilled 

## 2019-04-09 ENCOUNTER — Other Ambulatory Visit: Payer: Self-pay | Admitting: Family Medicine

## 2019-04-10 ENCOUNTER — Encounter: Payer: Self-pay | Admitting: Family Medicine

## 2019-04-10 ENCOUNTER — Other Ambulatory Visit: Payer: Self-pay

## 2019-04-10 ENCOUNTER — Ambulatory Visit (INDEPENDENT_AMBULATORY_CARE_PROVIDER_SITE_OTHER): Payer: Medicare HMO | Admitting: Family Medicine

## 2019-04-10 DIAGNOSIS — I700 Atherosclerosis of aorta: Secondary | ICD-10-CM

## 2019-04-10 DIAGNOSIS — D696 Thrombocytopenia, unspecified: Secondary | ICD-10-CM

## 2019-04-10 DIAGNOSIS — D45 Polycythemia vera: Secondary | ICD-10-CM

## 2019-04-10 DIAGNOSIS — I10 Essential (primary) hypertension: Secondary | ICD-10-CM | POA: Diagnosis not present

## 2019-04-10 DIAGNOSIS — M109 Gout, unspecified: Secondary | ICD-10-CM | POA: Diagnosis not present

## 2019-04-10 DIAGNOSIS — Z0000 Encounter for general adult medical examination without abnormal findings: Secondary | ICD-10-CM

## 2019-04-10 DIAGNOSIS — J410 Simple chronic bronchitis: Secondary | ICD-10-CM

## 2019-04-10 DIAGNOSIS — E538 Deficiency of other specified B group vitamins: Secondary | ICD-10-CM | POA: Diagnosis not present

## 2019-04-10 DIAGNOSIS — E782 Mixed hyperlipidemia: Secondary | ICD-10-CM | POA: Diagnosis not present

## 2019-04-10 DIAGNOSIS — C61 Malignant neoplasm of prostate: Secondary | ICD-10-CM

## 2019-04-10 DIAGNOSIS — E559 Vitamin D deficiency, unspecified: Secondary | ICD-10-CM

## 2019-04-10 DIAGNOSIS — K5090 Crohn's disease, unspecified, without complications: Secondary | ICD-10-CM

## 2019-04-10 MED ORDER — VASCEPA 1 G PO CAPS: 2.0000 | ORAL_CAPSULE | Freq: Two times a day (BID) | ORAL | 3 refills | Status: DC

## 2019-04-10 NOTE — Addendum Note (Signed)
Addended by: Zannie Cove on: 04/10/2019 11:24 AM   Modules accepted: Orders

## 2019-04-10 NOTE — Progress Notes (Signed)
Virtual Visit Via telephone Note I connected with@ on 04/10/19 by telephone and verified that I am speaking with the correct person or authorized healthcare agent using two identifiers. Luis Deleon is currently located at home and there are no unauthorized people in close proximity. I completed this visit while in a private location in my home .  This visit type was conducted due to national recommendations for restrictions regarding the COVID-19 Pandemic (e.g. social distancing).  This format is felt to be most appropriate for this patient at this time.  All issues noted in this document were discussed and addressed.  No physical exam was performed.    I discussed the limitations, risks, security and privacy concerns of performing an evaluation and management service by telephone and the availability of in person appointments. I also discussed with the patient that there may be a patient responsible charge related to this service. The patient expressed understanding and agreed to proceed.   Date:  04/10/2019    ID:  Luis Deleon      Oct 17, 1940        [id number]   Patient Care Team Patient Care Team: Chipper Herb, MD as PCP - General (Family Medicine) Lorretta Harp, MD as PCP - Cardiology (Cardiology) Lafayette Dragon, MD (Inactive) as Consulting Physician (Gastroenterology) Curt Bears, MD as Consulting Physician (Oncology) Fanny Skates, MD as Consulting Physician (General Surgery) Herminio Commons, MD as Consulting Physician (Cardiology) Irine Seal, MD as Attending Physician (Urology)  Reason for Visit: Primary Care Follow-up     History of Present Illness & Review of Systems:     Luis Deleon is a 78 y.o. year old male primary care patient that presents today for a telehealth visit.  The patient is alert and doing well.  He denies any chest pain pressure tightness or shortness of breath.  He still has occasional abdominal pain and Dr. Elmo Putt has  prescribed Dilaudid for this.  He may take this for a couple of doses every 3 to 4 months.  I told him to continue to rely on Dilaudid prescriptions from Dr. Elmo Putt.  He still has occasional loose bowel movements which are normal for him with his Crohn's disease.  Otherwise he denies any trouble with swallowing heartburn indigestion nausea vomiting blood in the stool or change in bowel habits.  He is passing his water well he has had a prostatectomy and he has had kidney stones.  He does still see Dr. Junious Silk periodically.  He also follows with Dr. Alvester Chou on a yearly basis.  This is for his heart.  Review of systems as stated, otherwise negative.  The patient does not have symptoms concerning for COVID-19 infection (fever, chills, cough, or new shortness of breath).      Current Medications (Verified) Allergies as of 04/10/2019      Reactions   Colchicine Diarrhea, Nausea And Vomiting, Other (See Comments)   REACTION: nausea, diarrhea and interactions with 71m      Medication List       Accurate as of April 10, 2019  8:19 AM. If you have any questions, ask your nurse or doctor.        allopurinol 100 MG tablet Commonly known as: ZYLOPRIM TAKE 2 TABLETS BY MOUTH DAILY AS DIRECTED   aspirin 81 MG EC tablet Take 81 mg by mouth daily.   atorvastatin 40 MG tablet Commonly known as: LIPITOR Take 1 tablet (40 mg total) by mouth  daily at 6 PM.   gemfibrozil 600 MG tablet Commonly known as: LOPID TAKE 1 TABLET (600 MG TOTAL) BY MOUTH DAILY.   hydrochlorothiazide 12.5 MG capsule Commonly known as: MICROZIDE TAKE 1 TABLET (12.5 MG TOTAL) BY MOUTH DAILY. AS DIRECTED   HYDROmorphone 2 MG tablet Commonly known as: DILAUDID Take 1-2 capsules by mouth every 3 hours as needed   losartan 100 MG tablet Commonly known as: COZAAR TAKE 1/2 TABLET BY MOUTH EVERY DAY AS DIRECTED   metoprolol tartrate 50 MG tablet Commonly known as: LOPRESSOR TAKE 1 TABLET BY MOUTH TWICE A DAY    nystatin-triamcinolone cream Commonly known as: MYCOLOG II Apply 1 application topically 2 (two) times daily.   Omega-3 1000 MG Caps Take 2 capsules by mouth 2 (two) times daily.   potassium chloride 20 MEQ packet Commonly known as: KLOR-CON Take 40 mEq by mouth once for 1 dose.   Vitamin D3 25 MCG (1000 UT) Caps Take 4 capsules by mouth every morning.           Allergies (Verified)    Colchicine  Past Medical History Past Medical History:  Diagnosis Date  . Abnormal LFTs (liver function tests)   . Arthritis   . Crohn disease ([place])   . Diverticulosis   . Fatty liver   . Gout   . Hepatic cyst   . Hyperlipemia   . Hyperplastic colon polyp 2012  . Hypertension   . Nephrolithiasis   . Polycythemia   . Prostate cancer ([place])    8 or 9 years  . Small bowel obstruction ([place]) 2003  . Vitamin D deficiency      Past Surgical History:  Procedure Laterality Date  . APPENDECTOMY  2003  . BACTERIAL OVERGROWTH TEST N/A 06/15/2016   Procedure: BACTERIAL OVERGROWTH TEST;  Surgeon: Md Physician Gastroenterology, MD;  Location: [facility];  Service: Gastroenterology;  Laterality: N/A;  . EXPLORATORY LAPAROTOMY W/ BOWEL RESECTION  2003  . LITHOTRIPSY  1980  . PROSTATECTOMY    . TONSILLECTOMY    . VENTRAL HERNIA REPAIR     2 times    Social History   Socioeconomic History  . Marital status: Married    Spouse name: Not on file  . Number of children: 1  . Years of education: Not on file  . Highest education level: Not on file  Occupational History  . Occupation: Retired     Comment: Systems analyst  . Financial resource strain: Not on file  . Food insecurity    Worry: Not on file    Inability: Not on file  . Transportation needs    Medical: Not on file    Non-medical: Not on file  Tobacco Use  . Smoking status: Former Smoker    Packs/day: 1.00    Years: 38.00    Pack years: 38.00    Types: Cigarettes    Quit date: 03/19/1991    Years since  quitting: 28.0  . Smokeless tobacco: Current User    Types: Chew  Substance and Sexual Activity  . Alcohol use: No  . Drug use: No  . Sexual activity: Never  Lifestyle  . Physical activity    Days per week: Not on file    Minutes per session: Not on file  . Stress: Not on file  Relationships  . Social Herbalist on phone: Not on file    Gets together: Not on file    Attends religious service:  Not on file    Active member of club or organization: Not on file    Attends meetings of clubs or organizations: Not on file    Relationship status: Not on file  Other Topics Concern  . Not on file  Social History Narrative  . Not on file     Family History  Problem Relation Age of Onset  . Heart disease Mother   . Cervical cancer Mother   . Heart disease Father   . Heart failure Father   . Dislocations Daughter 8       Bilateral knee  . Heart disease Maternal Uncle   . Colon cancer Neg Hx   . Esophageal cancer Neg Hx   . Rectal cancer Neg Hx   . Stomach cancer Neg Hx       Labs/Other Tests and Data Reviewed:    Wt Readings from Last 3 Encounters:  02/26/19 243 lb (110.2 kg)  11/20/18 246 lb (111.6 kg)  10/10/18 246 lb 12.8 oz (111.9 kg)   Temp Readings from Last 3 Encounters:  11/20/18 97.9 F (36.6 C) (Oral)  10/10/18 97.7 F (36.5 C) (Oral)  07/17/18 (!) 97 F (36.1 C) (Oral)   BP Readings from Last 3 Encounters:  02/26/19 133/76  11/20/18 111/65  10/10/18 135/79   Pulse Readings from Last 3 Encounters:  02/26/19 62  11/20/18 63  10/10/18 74     Lab Results  Component Value Date   HGBA1C 5.3 04/02/2014   Lab Results  Component Value Date   LDLCALC 77 11/20/2018   CREATININE 1.27 11/20/2018       Chemistry      Component Value Date/Time   NA 143 11/20/2018 1058   K 4.2 11/20/2018 1058   CL 104 11/20/2018 1058   CO2 24 11/20/2018 1058   BUN 16 11/20/2018 1058   CREATININE 1.27 11/20/2018 1058   CREATININE 1.56 (H) 09/02/2013 0818       Component Value Date/Time   CALCIUM 9.4 11/20/2018 1058   ALKPHOS 66 11/20/2018 1058   AST 26 11/20/2018 1058   ALT 22 11/20/2018 1058   BILITOT 1.0 11/20/2018 1058         OBSERVATIONS/ OBJECTIVE:     The patient was alert and responded appropriately to questions asked of him.  His blood pressure is running between 120-125/60-70 range.  He has not been running any fever.  Unfortunately his weight is gone up because of the [place] pandemic and is now 245 pounds.  He promises to work on this more aggressively.  Physical exam deferred due to nature of telephonic visit.  ASSESSMENT & PLAN    Time:   Today, I have spent 28 minutes with the patient via telephone discussing the above including Covid precautions.     Visit Diagnoses: 1. Vitamin B 12 deficiency -Patient has not been taking vitamin B12 shots regularly and has not had any since February.  We will check a level may have to reinitiate this.  2. Essential hypertension -Blood pressures at home are good and running between 120 and 125 over the 60-70 range.  He will continue the current treatment and watching sodium intake  3. Gout, unspecified cause, unspecified chronicity, unspecified site -No gout flares and he is on allopurinol 200 mg daily  4. Vitamin D deficiency -Continue with vitamin D replacement and do 5000 units 6 days weekly  5. Thrombocytopenia ([place]) -Follow-up as needed with hematology  6. Prostate cancer [facility]) -Follow-up as needed with  Dr. Junious Silk  7. Morbid obesity ([place]) -Weight is up and he understands the importance of losing weight through diet and exercise  8. Mixed hyperlipidemia -Continue with atorvastatin and aggressive therapeutic lifestyle changes and follow-up with cardiology as planned, Dr. Alvester Chou.  9. Abdominal aortic atherosclerosis ([place]) -Work to reduce risk factors through diet and exercise  10. Polycythemia vera ([place]) -Follow-up with hematology as planned  11. Crohn's disease  without complication, unspecified gastrointestinal tract location [facility]) -Follow-up with Dr. Elmo Putt and get colonoscopy in September because of multiple colon polyps  12. Simple chronic bronchitis ([facility]) -Continue to practice good respiratory and hand hygiene drink plenty of fluids and avoid irritating environments  Patient Instructions  Continue to practice good preventative care and wear facial mask and gloves when out in the public and wash hands regularly Continue to follow-up with gastroenterology as planned Follow-up with cardiology as planned Come to the office for fasting blood work and call my nurse and work at a time to come in for that that is safe for you Wear gloves and mask for blood draw Follow-up with urology as planned Change vitamin D to 5000 units once daily for 6 days weekly We will retry vascepa see if insurance will cover this.     The above assessment and management plan was discussed with the patient. The patient verbalized understanding of and has agreed to the management plan. Patient is aware to call the clinic if symptoms persist or worsen. Patient is aware when to return to the clinic for a follow-up visit. Patient educated on when it is appropriate to go to the emergency department.    Chipper Herb, MD [address], [place], [place] [address] Ph [phone]   Arrie Senate MD

## 2019-04-10 NOTE — Patient Instructions (Addendum)
Continue to practice good preventative care and wear facial mask and gloves when out in the public and wash hands regularly Continue to follow-up with gastroenterology as planned Follow-up with cardiology as planned Come to the office for fasting blood work and call my nurse and work at a time to come in for that that is safe for you Wear gloves and mask for blood draw Follow-up with urology as planned Change vitamin D to 5000 units once daily for 6 days weekly We will retry vascepa see if insurance will cover this.

## 2019-04-17 ENCOUNTER — Other Ambulatory Visit: Payer: Self-pay | Admitting: Family Medicine

## 2019-04-20 ENCOUNTER — Other Ambulatory Visit: Payer: Self-pay

## 2019-04-20 ENCOUNTER — Other Ambulatory Visit: Payer: Medicare HMO

## 2019-04-20 DIAGNOSIS — E538 Deficiency of other specified B group vitamins: Secondary | ICD-10-CM

## 2019-04-20 DIAGNOSIS — I10 Essential (primary) hypertension: Secondary | ICD-10-CM | POA: Diagnosis not present

## 2019-04-20 DIAGNOSIS — E559 Vitamin D deficiency, unspecified: Secondary | ICD-10-CM

## 2019-04-20 DIAGNOSIS — E782 Mixed hyperlipidemia: Secondary | ICD-10-CM

## 2019-04-20 DIAGNOSIS — I700 Atherosclerosis of aorta: Secondary | ICD-10-CM | POA: Diagnosis not present

## 2019-04-20 DIAGNOSIS — D696 Thrombocytopenia, unspecified: Secondary | ICD-10-CM | POA: Diagnosis not present

## 2019-04-20 DIAGNOSIS — M109 Gout, unspecified: Secondary | ICD-10-CM | POA: Diagnosis not present

## 2019-04-20 DIAGNOSIS — J410 Simple chronic bronchitis: Secondary | ICD-10-CM

## 2019-04-20 DIAGNOSIS — K5090 Crohn's disease, unspecified, without complications: Secondary | ICD-10-CM

## 2019-04-20 DIAGNOSIS — D45 Polycythemia vera: Secondary | ICD-10-CM

## 2019-04-20 DIAGNOSIS — C61 Malignant neoplasm of prostate: Secondary | ICD-10-CM | POA: Diagnosis not present

## 2019-04-20 DIAGNOSIS — Z0000 Encounter for general adult medical examination without abnormal findings: Secondary | ICD-10-CM

## 2019-04-20 NOTE — Telephone Encounter (Signed)
Patient requested a refill of Klor- con-please advise

## 2019-04-20 NOTE — Telephone Encounter (Signed)
I can tell from looking on the chart if the patient is still supposed to be on this or not, his last potassium level was good and I do not see him on any major diuretics such as furosemide that would require this so I am inclined to lean towards the fact that he does not need it but he needs to establish with 1 of [place] as a provider who can decipher this better.

## 2019-04-20 NOTE — Telephone Encounter (Signed)
Patient aware.

## 2019-04-21 LAB — BMP8+EGFR
BUN/Creatinine Ratio: 10 (ref 10–24)
BUN: 13 mg/dL (ref 8–27)
CO2: 22 mmol/L (ref 20–29)
Calcium: 9.6 mg/dL (ref 8.6–10.2)
Chloride: 104 mmol/L (ref 96–106)
Creatinine, Ser: 1.31 mg/dL — ABNORMAL HIGH (ref 0.76–1.27)
GFR calc Af Amer: 61 mL/min/{1.73_m2} (ref >59)
GFR calc non Af Amer: 53 mL/min/{1.73_m2} — ABNORMAL LOW (ref >59)
Glucose: 87 mg/dL (ref 65–99)
Potassium: 4.4 mmol/L (ref 3.5–5.2)
Sodium: 142 mmol/L (ref 134–144)

## 2019-04-21 LAB — CBC WITH DIFFERENTIAL/PLATELET
Basophils Absolute: 0 10*3/uL (ref 0.0–0.2)
Basos: 1 %
EOS (ABSOLUTE): 0.2 10*3/uL (ref 0.0–0.4)
Eos: 2 %
Hematocrit: 55.6 % — ABNORMAL HIGH (ref 37.5–51.0)
Hemoglobin: 18.1 g/dL — ABNORMAL HIGH (ref 13.0–17.7)
Immature Grans (Abs): 0 10*3/uL (ref 0.0–0.1)
Immature Granulocytes: 1 %
Lymphocytes Absolute: 2.5 10*3/uL (ref 0.7–3.1)
Lymphs: 32 %
MCH: 31.3 pg (ref 26.6–33.0)
MCHC: 32.6 g/dL (ref 31.5–35.7)
MCV: 96 fL (ref 79–97)
Monocytes Absolute: 0.8 10*3/uL (ref 0.1–0.9)
Monocytes: 10 %
Neutrophils Absolute: 4.2 10*3/uL (ref 1.4–7.0)
Neutrophils: 54 %
Platelets: 144 10*3/uL — ABNORMAL LOW (ref 150–450)
RBC: 5.78 x10E6/uL (ref 4.14–5.80)
RDW: 14.6 % (ref 11.6–15.4)
WBC: 7.8 10*3/uL (ref 3.4–10.8)

## 2019-04-21 LAB — LIPID PANEL
Chol/HDL Ratio: 3.1 ratio (ref 0.0–5.0)
Cholesterol, Total: 113 mg/dL (ref 100–199)
HDL: 37 mg/dL — ABNORMAL LOW (ref >39)
LDL Calculated: 45 mg/dL (ref 0–99)
Triglycerides: 157 mg/dL — ABNORMAL HIGH (ref 0–149)
VLDL Cholesterol Cal: 31 mg/dL (ref 5–40)

## 2019-04-21 LAB — HEPATIC FUNCTION PANEL
ALT: 27 IU/L (ref 0–44)
AST: 32 IU/L (ref 0–40)
Albumin: 3.8 g/dL (ref 3.7–4.7)
Alkaline Phosphatase: 60 IU/L (ref 39–117)
Bilirubin Total: 2.1 mg/dL — ABNORMAL HIGH (ref 0.0–1.2)
Bilirubin, Direct: 0.38 mg/dL (ref 0.00–0.40)
Total Protein: 6.3 g/dL (ref 6.0–8.5)

## 2019-04-21 LAB — THYROID PANEL WITH TSH
Free Thyroxine Index: 1.8 (ref 1.2–4.9)
T3 Uptake Ratio: 24 % (ref 24–39)
T4, Total: 7.7 ug/dL (ref 4.5–12.0)
TSH: 2.72 u[IU]/mL (ref 0.450–4.500)

## 2019-04-21 LAB — URIC ACID: Uric Acid: 5.1 mg/dL (ref 3.7–8.6)

## 2019-04-21 LAB — VITAMIN B12: Vitamin B-12: 279 pg/mL (ref 232–1245)

## 2019-04-21 LAB — VITAMIN D 25 HYDROXY (VIT D DEFICIENCY, FRACTURES): Vit D, 25-Hydroxy: 47.5 ng/mL (ref 30.0–100.0)

## 2019-04-24 ENCOUNTER — Telehealth: Payer: Self-pay | Admitting: Family Medicine

## 2019-04-24 MED ORDER — KLOR-CON M20 20 MEQ PO TBCR: 40.0000 meq | EXTENDED_RELEASE_TABLET | Freq: Every day | ORAL | 6 refills | Status: DC

## 2019-04-24 NOTE — Telephone Encounter (Signed)
I have sent results to the pool, go ahead and send a refill for the potassium for 6 months

## 2019-04-24 NOTE — Telephone Encounter (Signed)
Results not reviewed please review

## 2019-04-24 NOTE — Telephone Encounter (Signed)
Patient's kidney and liver look good, his cholesterol was really the only thing that was elevated.  His blood count is also slightly elevated but he typically runs higher and I am not concerned about that.  His kidney function is elevated but stable and unchanged, no changes in current medication.

## 2019-04-24 NOTE — Telephone Encounter (Signed)
Patient aware and verbalizes understanding. 

## 2019-05-04 DIAGNOSIS — N476 Balanoposthitis: Secondary | ICD-10-CM | POA: Diagnosis not present

## 2019-05-15 ENCOUNTER — Encounter: Payer: Self-pay | Admitting: Internal Medicine

## 2019-05-18 ENCOUNTER — Encounter: Payer: Self-pay | Admitting: Internal Medicine

## 2019-06-07 ENCOUNTER — Other Ambulatory Visit: Payer: Self-pay | Admitting: Family Medicine

## 2019-06-10 ENCOUNTER — Other Ambulatory Visit: Payer: Self-pay

## 2019-06-10 ENCOUNTER — Ambulatory Visit (AMBULATORY_SURGERY_CENTER): Payer: Self-pay | Admitting: *Deleted

## 2019-06-10 VITALS — Temp 97.3°F | Ht 68.0 in | Wt 247.0 lb

## 2019-06-10 DIAGNOSIS — Z86010 Personal history of colonic polyps: Secondary | ICD-10-CM

## 2019-06-10 MED ORDER — PEG 3350-KCL-NA BICARB-NACL 420 G PO SOLR: 4000.0000 mL | Freq: Once | ORAL | 0 refills | Status: AC

## 2019-06-10 NOTE — Progress Notes (Signed)
Patient is here in person for PV today. Patient denies any allergies to eggs or soy. Patient denies any problems with anesthesia/sedation. Patient denies any oxygen use at home. Patient denies taking any diet/weight loss medications or blood thinners. EMMI education assisgned to patient on colonoscopy, this was explained and instructions given to patient.Pt is aware that care partner will wait in the car during procedure; if they feel like they will be too hot to wait in the car; they may wait in the lobby.  We want them to wear a mask (we do not have any that we can provide them), practice social distancing, and we will check their temperatures when they get here.  I did remind patient that their care partner needs to stay in the parking lot the entire time. Pt will wear mask into building.

## 2019-06-12 ENCOUNTER — Other Ambulatory Visit: Payer: Self-pay | Admitting: Family Medicine

## 2019-06-15 ENCOUNTER — Encounter: Payer: Self-pay | Admitting: Internal Medicine

## 2019-06-23 ENCOUNTER — Telehealth: Payer: Self-pay

## 2019-06-23 NOTE — Telephone Encounter (Signed)
Pt returned call and answered NO to all the Covid-19 screening questions

## 2019-06-23 NOTE — Telephone Encounter (Signed)
Covid-19 screening questions   Do you now or have you had a fever in the last 14 days?  Do you have any respiratory symptoms of shortness of breath or cough now or in the last 14 days?  Do you have any family members or close contacts with diagnosed or suspected Covid-19 in the past 14 days?  Have you been tested for Covid-19 and found to be positive?       

## 2019-06-24 ENCOUNTER — Encounter: Payer: Self-pay | Admitting: Gastroenterology

## 2019-06-24 ENCOUNTER — Other Ambulatory Visit: Payer: Self-pay

## 2019-06-24 ENCOUNTER — Ambulatory Visit (AMBULATORY_SURGERY_CENTER): Payer: Medicare HMO | Admitting: Gastroenterology

## 2019-06-24 VITALS — BP 131/78 | HR 67 | Temp 98.8°F | Resp 12 | Ht 68.0 in | Wt 247.0 lb

## 2019-06-24 DIAGNOSIS — Z86010 Personal history of colonic polyps: Secondary | ICD-10-CM | POA: Diagnosis not present

## 2019-06-24 MED ORDER — SODIUM CHLORIDE 0.9 % IV SOLN: 500.0000 mL | Freq: Once | INTRAVENOUS | Status: DC

## 2019-06-24 NOTE — Op Note (Signed)
[facility] Patient Name: Luis Deleon Procedure Date: 06/24/2019 8:34 AM MRN: [id number] Endoscopist: Mauri Pole , MD Age: 78 Referring MD:  Date of Birth: Jul 12, 1941 Gender: Male Account #: [id number] Procedure:                Colonoscopy Indications:              High risk colon cancer surveillance: Personal                            history of colonic polyps, Surveillance: History of                            numerous (> 10) adenomas on last colonoscopy (< 3                            yrs), High risk colon cancer surveillance: Crohn's                            colitis of 8 (or more) years duration with                            one-third (or more) of the colon involved Medicines:                Monitored Anesthesia Care Procedure:                Pre-Anesthesia Assessment:                           - Prior to the procedure, a History and Physical                            was performed, and patient medications and                            allergies were reviewed. The patient's tolerance of                            previous anesthesia was also reviewed. The risks                            and benefits of the procedure and the sedation                            options and risks were discussed with the patient.                            All questions were answered, and informed consent                            was obtained. Prior Anticoagulants: The patient has                            taken no previous anticoagulant or antiplatelet  agents. ASA Grade Assessment: III - A patient with                            severe systemic disease. After reviewing the risks                            and benefits, the patient was deemed in                            satisfactory condition to undergo the procedure.                           After obtaining informed consent, the colonoscope                            was passed under  direct vision. Throughout the                            procedure, the patient's blood pressure, pulse, and                            oxygen saturations were monitored continuously. The                            Colonoscope was introduced through the anus and                            advanced to the the ileocolonic anastomosis. The                            colonoscopy was performed without difficulty. The                            patient tolerated the procedure well. The quality                            of the bowel preparation was excellent. The rectum                            and ileocolonic anastomosis were photographed. Scope In: 8:35:31 AM Scope Out: 8:56:08 AM Scope Withdrawal Time: 0 hours 13 minutes 18 seconds  Total Procedure Duration: 0 hours 20 minutes 37 seconds  Findings:                 The perianal and digital rectal examinations were                            normal.                           There was evidence of a prior side-to-side                            ileo-colonic anastomosis in the proximal ascending  colon. This was patent and was characterized by                            healthy appearing mucosa.                           Scattered small-mouthed diverticula were found in                            the sigmoid colon and descending colon.                           Non-bleeding internal hemorrhoids were found during                            retroflexion. The hemorrhoids were small.                           The exam was otherwise without abnormality. Complications:            No immediate complications. Estimated Blood Loss:     Estimated blood loss: none. Impression:               - Patent side-to-side ileo-colonic anastomosis,                            characterized by healthy appearing mucosa.                           - Mild diverticulosis in the sigmoid colon and in                            the descending  colon.                           - Non-bleeding internal hemorrhoids.                           - The examination was otherwise normal.                           - No specimens collected. Recommendation:           - Patient has a contact number available for                            emergencies. The signs and symptoms of potential                            delayed complications were discussed with the                            patient. Return to normal activities tomorrow.                            Written discharge instructions were provided to the  patient.                           - Resume previous diet.                           - Continue present medications.                           - Repeat colonoscopy in 3 years for surveillance,                            to be scheduled with Dr Hilarie Fredrickson.                           - Follow up with Dr Hilarie Fredrickson in 6-12 months or sooner                            if needed Mauri Pole, MD 06/24/2019 9:03:59 AM This report has been signed electronically.

## 2019-06-24 NOTE — Progress Notes (Signed)
Pt's states no medical or surgical changes since previsit or office visit.  JB temps, CW vitals and JK IV.

## 2019-06-24 NOTE — Patient Instructions (Signed)
Please read handouts provided. Continue present medications. Follow-up with Dr. Hilarie Fredrickson in 6-12 months or sooner if needed. Repeat colonoscopy in 3 years for surveillance.      YOU HAD AN ENDOSCOPIC PROCEDURE TODAY AT [facility]:   Refer to the procedure report that was given to you for any specific questions about what was found during the examination.  If the procedure report does not answer your questions, please call your gastroenterologist to clarify.  If you requested that your care partner not be given the details of your procedure findings, then the procedure report has been included in a sealed envelope for you to review at your convenience later.  YOU SHOULD EXPECT: Some feelings of bloating in the abdomen. Passage of more gas than usual.  Walking can help get rid of the air that was put into your GI tract during the procedure and reduce the bloating. If you had a lower endoscopy (such as a colonoscopy or flexible sigmoidoscopy) you may notice spotting of blood in your stool or on the toilet paper. If you underwent a bowel prep for your procedure, you may not have a normal bowel movement for a few days.  Please Note:  You might notice some irritation and congestion in your nose or some drainage.  This is from the oxygen used during your procedure.  There is no need for concern and it should clear up in a day or so.  SYMPTOMS TO REPORT IMMEDIATELY:   Following lower endoscopy (colonoscopy or flexible sigmoidoscopy):  Excessive amounts of blood in the stool  Significant tenderness or worsening of abdominal pains  Swelling of the abdomen that is new, acute  Fever of 100F or higher   For urgent or emergent issues, a gastroenterologist can be reached at any hour by calling [phone].   DIET:  We do recommend a small meal at first, but then you may proceed to your regular diet.  Drink plenty of fluids but you should avoid alcoholic beverages for 24  hours.  ACTIVITY:  You should plan to take it easy for the rest of today and you should NOT DRIVE or use heavy machinery until tomorrow (because of the sedation medicines used during the test).    FOLLOW UP: Our staff will call the number listed on your records 48-72 hours following your procedure to check on you and address any questions or concerns that you may have regarding the information given to you following your procedure. If we do not reach you, we will leave a message.  We will attempt to reach you two times.  During this call, we will ask if you have developed any symptoms of COVID 19. If you develop any symptoms (ie: fever, flu-like symptoms, shortness of breath, cough etc.) before then, please call [phone].  If you test positive for Covid 19 in the 2 weeks post procedure, please call and report this information to [place].    If any biopsies were taken you will be contacted by phone or by letter within the next 1-3 weeks.  Please call us at [phone] if you have not heard about the biopsies in 3 weeks.    SIGNATURES/CONFIDENTIALITY: You and/or your care partner have signed paperwork which will be entered into your electronic medical record.  These signatures attest to the fact that that the information above on your After Visit Summary has been reviewed and is understood.  Full responsibility of the confidentiality of this discharge information lies with you  and/or your care-partner.

## 2019-06-24 NOTE — Progress Notes (Signed)
PT taken to PACU. Monitors in place. VSS. Report given to RN. 

## 2019-06-26 ENCOUNTER — Telehealth: Payer: Self-pay | Admitting: *Deleted

## 2019-06-26 NOTE — Telephone Encounter (Signed)
1. Have you developed a fever since your procedure? no  2.   Have you had an respiratory symptoms (SOB or cough) since your procedure? no  3.   Have you tested positive for COVID 19 since your procedure no  4.   Have you had any family members/close contacts diagnosed with the COVID 19 since your procedure?  no   If yes to any of these questions please route to Joylene John, RN and Alphonsa Gin, Therapist, sports.    Follow up Call-  Call back number 06/24/2019 06/19/2018  Post procedure Call Back phone  # [phone]  Permission to leave phone message Yes Yes  Some recent data might be hidden     Patient questions:  Do you have a fever, pain , or abdominal swelling? No. Pain Score  0 *  Have you tolerated food without any problems? Yes.    Have you been able to return to your normal activities? Yes.    Do you have any questions about your discharge instructions: Diet   No. Medications  No. Follow up visit  No.  Do you have questions or concerns about your Care? No.  Actions: * If pain score is 4 or above: No action needed, pain <4.

## 2019-07-07 ENCOUNTER — Other Ambulatory Visit: Payer: Self-pay

## 2019-07-07 ENCOUNTER — Ambulatory Visit (INDEPENDENT_AMBULATORY_CARE_PROVIDER_SITE_OTHER): Payer: Medicare HMO

## 2019-07-07 DIAGNOSIS — Z23 Encounter for immunization: Secondary | ICD-10-CM | POA: Diagnosis not present

## 2019-07-13 ENCOUNTER — Telehealth: Payer: Self-pay | Admitting: Family Medicine

## 2019-07-13 ENCOUNTER — Ambulatory Visit (INDEPENDENT_AMBULATORY_CARE_PROVIDER_SITE_OTHER): Payer: Medicare HMO | Admitting: Family Medicine

## 2019-07-13 ENCOUNTER — Encounter: Payer: Self-pay | Admitting: Family Medicine

## 2019-07-13 DIAGNOSIS — I10 Essential (primary) hypertension: Secondary | ICD-10-CM

## 2019-07-13 DIAGNOSIS — M109 Gout, unspecified: Secondary | ICD-10-CM | POA: Diagnosis not present

## 2019-07-13 DIAGNOSIS — E538 Deficiency of other specified B group vitamins: Secondary | ICD-10-CM

## 2019-07-13 DIAGNOSIS — E782 Mixed hyperlipidemia: Secondary | ICD-10-CM | POA: Diagnosis not present

## 2019-07-13 MED ORDER — HYDROCHLOROTHIAZIDE 12.5 MG PO CAPS: 12.5000 mg | ORAL_CAPSULE | Freq: Every day | ORAL | 3 refills | Status: DC

## 2019-07-13 MED ORDER — LOSARTAN POTASSIUM 50 MG PO TABS: 50.0000 mg | ORAL_TABLET | Freq: Every day | ORAL | 3 refills | Status: DC

## 2019-07-13 MED ORDER — GEMFIBROZIL 600 MG PO TABS: 600.0000 mg | ORAL_TABLET | Freq: Every day | ORAL | 3 refills | Status: DC

## 2019-07-13 MED ORDER — ATORVASTATIN CALCIUM 40 MG PO TABS: 40.0000 mg | ORAL_TABLET | Freq: Every day | ORAL | 3 refills | Status: DC

## 2019-07-13 MED ORDER — ALLOPURINOL 100 MG PO TABS: 100.0000 mg | ORAL_TABLET | Freq: Two times a day (BID) | ORAL | 3 refills | Status: DC

## 2019-07-13 MED ORDER — METOPROLOL TARTRATE 50 MG PO TABS: 50.0000 mg | ORAL_TABLET | Freq: Two times a day (BID) | ORAL | 3 refills | Status: DC

## 2019-07-13 NOTE — Telephone Encounter (Signed)
Correct directions given to pharmacist at [facility]

## 2019-07-13 NOTE — Progress Notes (Signed)
Virtual Visit via telephone Note  I connected with Luis Deleon on 07/13/19 at 1032 by telephone and verified that I am speaking with the correct person using two identifiers. Luis Deleon is currently located at home and no other people are currently with her during visit. The provider, Luis Deleon , MD is located in their office at time of visit.  Call ended at 1058  I discussed the limitations, risks, security and privacy concerns of performing an evaluation and management service by telephone and the availability of in person appointments. I also discussed with the patient that there may be a patient responsible charge related to this service. The patient expressed understanding and agreed to proceed.  bp 120/68 History and Present Illness: Hypertension Patient is currently on hctz and losartan, and their blood pressure today is 120/68. Patient denies any lightheadedness or dizziness. Patient denies headaches, blurred vision, chest pains, shortness of breath, or weakness. Denies any side effects from medication and is content with current medication.   Hyperlipidemia Patient is coming in for recheck of his hyperlipidemia. The patient is currently taking lipitor. They deny any issues with myalgias or history of liver damage from it. They deny any focal numbness or weakness or chest pain.   Gout Patient is coming in for recheck for gout and takes allopurinol and has not had gout since starting it.   Patient coming in for recheck of b12 and vit d deficiency  No diagnosis found.  Outpatient Encounter Medications as of 07/13/2019  Medication Sig  . allopurinol (ZYLOPRIM) 100 MG tablet TAKE 2 TABLETS BY MOUTH DAILY AS DIRECTED  . aspirin 81 MG EC tablet Take 81 mg by mouth daily.    Marland Kitchen atorvastatin (LIPITOR) 40 MG tablet Take 1 tablet (40 mg total) by mouth daily at 6 PM.  . Cholecalciferol (VITAMIN D3) 1000 UNITS CAPS Take 4 capsules by mouth every morning.   Marland Kitchen gemfibrozil  (LOPID) 600 MG tablet TAKE 1 TABLET (600 MG TOTAL) BY MOUTH DAILY.  . hydrochlorothiazide (MICROZIDE) 12.5 MG capsule TAKE 1 TABLET (12.5 MG TOTAL) BY MOUTH DAILY. AS DIRECTED  . HYDROmorphone (DILAUDID) 2 MG tablet Take 1-2 capsules by mouth every 3 hours as needed (Patient not taking: Reported on 06/10/2019)  . KLOR-CON M20 20 MEQ tablet Take 2 tablets (40 mEq total) by mouth daily.  Marland Kitchen losartan (COZAAR) 100 MG tablet TAKE 1/2 TABLET BY MOUTH EVERY DAY AS DIRECTED  . metoprolol tartrate (LOPRESSOR) 50 MG tablet TAKE 1 TABLET BY MOUTH TWICE A DAY  . nystatin-triamcinolone (MYCOLOG II) cream Apply 1 application topically 2 (two) times daily.  . OMEGA-3 1000 MG CAPS Take 2 capsules by mouth 2 (two) times daily.   Facility-Administered Encounter Medications as of 07/13/2019  Medication  . cyanocobalamin ((VITAMIN B-12)) injection 1,000 mcg    Review of Systems  Constitutional: Negative for chills and fever.  Respiratory: Negative for shortness of breath and wheezing.   Cardiovascular: Negative for chest pain and leg swelling.  Musculoskeletal: Negative for back pain and gait problem.  Skin: Negative for rash.  Neurological: Negative for dizziness, weakness and light-headedness.  Psychiatric/Behavioral: Negative for dysphoric mood, self-injury, sleep disturbance and suicidal ideas. The patient is not nervous/anxious.   All other systems reviewed and are negative.   Observations/Objective: Patient sounds comfortable and in no acute distress  Assessment and Plan: Problem List Items Addressed This Visit      Cardiovascular and Mediastinum   HTN (hypertension)   Relevant Medications  gemfibrozil (LOPID) 600 MG tablet   atorvastatin (LIPITOR) 40 MG tablet   hydrochlorothiazide (MICROZIDE) 12.5 MG capsule   losartan (COZAAR) 50 MG tablet   metoprolol tartrate (LOPRESSOR) 50 MG tablet     Other   Hyperlipidemia - Primary   Relevant Medications   gemfibrozil (LOPID) 600 MG tablet    atorvastatin (LIPITOR) 40 MG tablet   hydrochlorothiazide (MICROZIDE) 12.5 MG capsule   losartan (COZAAR) 50 MG tablet   metoprolol tartrate (LOPRESSOR) 50 MG tablet   GOUT, UNSPECIFIED    Other Visit Diagnoses    Low serum vitamin B12           Follow Up Instructions: Follow up in 4 months and continue current medications.    I discussed the assessment and treatment plan with the patient. The patient was provided an opportunity to ask questions and all were answered. The patient agreed with the plan and demonstrated an understanding of the instructions.   The patient was advised to call back or seek an in-person evaluation if the symptoms worsen or if the condition fails to improve as anticipated.  The above assessment and management plan was discussed with the patient. The patient verbalized understanding of and has agreed to the management plan. Patient is aware to call the clinic if symptoms persist or worsen. Patient is aware when to return to the clinic for a follow-up visit. Patient educated on when it is appropriate to go to the emergency department.    I provided 26 minutes of non-face-to-face time during this encounter.    Luis Rancher, MD

## 2019-07-13 NOTE — Telephone Encounter (Signed)
Just get rid of the half a tablet, patient is supposed to take 50 mg daily a whole tablet.

## 2019-10-11 ENCOUNTER — Other Ambulatory Visit: Payer: Self-pay | Admitting: Family Medicine

## 2019-11-13 ENCOUNTER — Other Ambulatory Visit: Payer: Self-pay

## 2019-11-16 ENCOUNTER — Encounter: Payer: Self-pay | Admitting: Family Medicine

## 2019-11-16 ENCOUNTER — Ambulatory Visit (INDEPENDENT_AMBULATORY_CARE_PROVIDER_SITE_OTHER): Payer: Medicare HMO | Admitting: Family Medicine

## 2019-11-16 ENCOUNTER — Other Ambulatory Visit: Payer: Self-pay

## 2019-11-16 VITALS — BP 122/69 | HR 63 | Temp 98.9°F | Ht 68.0 in | Wt 251.4 lb

## 2019-11-16 DIAGNOSIS — Z8546 Personal history of malignant neoplasm of prostate: Secondary | ICD-10-CM | POA: Diagnosis not present

## 2019-11-16 DIAGNOSIS — I10 Essential (primary) hypertension: Secondary | ICD-10-CM | POA: Diagnosis not present

## 2019-11-16 DIAGNOSIS — E8881 Metabolic syndrome: Secondary | ICD-10-CM

## 2019-11-16 DIAGNOSIS — E782 Mixed hyperlipidemia: Secondary | ICD-10-CM | POA: Diagnosis not present

## 2019-11-16 DIAGNOSIS — M109 Gout, unspecified: Secondary | ICD-10-CM | POA: Diagnosis not present

## 2019-11-16 MED ORDER — KLOR-CON M20 20 MEQ PO TBCR: 40.0000 meq | EXTENDED_RELEASE_TABLET | Freq: Every day | ORAL | 2 refills | Status: DC

## 2019-11-16 NOTE — Progress Notes (Signed)
BP 122/69   Pulse 63   Temp 98.9 F (37.2 C) (Temporal)   Ht 5' 8"  (1.727 m)   Wt 251 lb 6.4 oz (114 kg)   SpO2 94%   BMI 38.23 kg/m    Subjective:   Patient ID: Luis Deleon, male    DOB: [birth year], 79 y.o.   MRN: [id number]  HPI: Luis Deleon is a 79 y.o. male presenting on 11/16/2019 for Hypertension (check up. Patietn states that he has been having a "buzzing sound in his head" x 6 months ) and Hyperlipidemia   HPI Hypertension Patient is currently on metoprolol and losartan and hydrochlorothiazide, and their blood pressure today is 122/69. Patient denies any lightheadedness or dizziness. Patient denies headaches, blurred vision, chest pains, shortness of breath, or weakness. Denies any side effects from medication and is content with current medication.   Hyperlipidemia Patient is coming in for recheck of his hyperlipidemia. The patient is currently taking gemfibrozil and atorvastatin and fish oil. They deny any issues with myalgias or history of liver damage from it. They deny any focal numbness or weakness or chest pain.  Patient has also been diagnosed with metabolic syndrome and is morbidly obese with a BMI of 38 and comorbidities including hypertension and cholesterol.  Discussed weight and exercise and activity and weight loss.  Gout Last attack: More than a year Attacks this year: None Medication: Allopurinol Location of attacks: Feet, left foot usually  Relevant past medical, surgical, family and social history reviewed and updated as indicated. Interim medical history since our last visit reviewed. Allergies and medications reviewed and updated.  Review of Systems  Constitutional: Negative for chills and fever.  Eyes: Negative for visual disturbance.  Respiratory: Negative for shortness of breath and wheezing.   Cardiovascular: Negative for chest pain and leg swelling.  Musculoskeletal: Negative for back pain and gait problem.  Skin: Negative for rash.    Neurological: Negative for dizziness, weakness and light-headedness.  All other systems reviewed and are negative.   Per HPI unless specifically indicated above   Allergies as of 11/16/2019      Reactions   Colchicine Diarrhea, Nausea And Vomiting, Other (See Comments)   REACTION: nausea, diarrhea and interactions with 82m      Medication List       Accurate as of November 16, 2019 10:50 AM. If you have any questions, ask your nurse or doctor.        allopurinol 100 MG tablet Commonly known as: ZYLOPRIM Take 1 tablet (100 mg total) by mouth 2 (two) times daily.   aspirin 81 MG EC tablet Take 81 mg by mouth daily.   atorvastatin 40 MG tablet Commonly known as: LIPITOR Take 1 tablet (40 mg total) by mouth daily at 6 PM.   gemfibrozil 600 MG tablet Commonly known as: LOPID Take 1 tablet (600 mg total) by mouth daily.   hydrochlorothiazide 12.5 MG capsule Commonly known as: MICROZIDE Take 1 capsule (12.5 mg total) by mouth daily. As directed   Klor-Con M20 20 MEQ tablet Generic drug: potassium chloride SA TAKE 2 TABLETS BY MOUTH DAILY   losartan 50 MG tablet Commonly known as: COZAAR Take 1 tablet (50 mg total) by mouth daily.   metoprolol tartrate 50 MG tablet Commonly known as: LOPRESSOR Take 1 tablet (50 mg total) by mouth 2 (two) times daily.   nystatin-triamcinolone cream Commonly known as: MYCOLOG II Apply 1 application topically 2 (two) times daily.   Omega-3 1000 MG  Caps Take 2 capsules by mouth 2 (two) times daily.   Vitamin D3 25 MCG (1000 UT) Caps Take 4 capsules by mouth every morning.        Objective:   BP 122/69   Pulse 63   Temp 98.9 F (37.2 C) (Temporal)   Ht 5' 8"  (1.727 m)   Wt 251 lb 6.4 oz (114 kg)   SpO2 94%   BMI 38.23 kg/m   Wt Readings from Last 3 Encounters:  11/16/19 251 lb 6.4 oz (114 kg)  06/24/19 247 lb (112 kg)  06/10/19 247 lb (112 kg)    Physical Exam Vitals and nursing note reviewed.  Constitutional:       General: He is not in acute distress.    Appearance: He is well-developed. He is not diaphoretic.  Eyes:     General: No scleral icterus.    Conjunctiva/sclera: Conjunctivae normal.  Neck:     Thyroid: No thyromegaly.  Cardiovascular:     Rate and Rhythm: Normal rate and regular rhythm.     Heart sounds: Normal heart sounds. No murmur.  Pulmonary:     Effort: Pulmonary effort is normal. No respiratory distress.     Breath sounds: Normal breath sounds. No wheezing.  Musculoskeletal:        General: Normal range of motion.     Cervical back: Neck supple.  Lymphadenopathy:     Cervical: No cervical adenopathy.  Skin:    General: Skin is warm and dry.     Findings: No rash.  Neurological:     Mental Status: He is alert and oriented to person, place, and time.     Coordination: Coordination normal.  Psychiatric:        Behavior: Behavior normal.       Assessment & Plan:   Problem List Items Addressed This Visit      Cardiovascular and Mediastinum   HTN (hypertension) - Primary   Relevant Orders   CBC with Differential/Platelet   CMP14+EGFR     Other   Hyperlipidemia   Relevant Orders   Lipid panel   GOUT, UNSPECIFIED   Relevant Orders   CBC with Differential/Platelet   History of prostate cancer   Relevant Orders   PSA, total and free   Metabolic syndrome      Continue current medication, no changes, his blood pressure and everything is looking good and under control.  His left ear buzzing is likely ringing based on what he saying, exam looks normal, recommend getting his hearing checked Follow up plan: Return in about 6 months (around 05/15/2020), or if symptoms worsen or fail to improve, for Hypertension and cholesterol recheck.  Counseling provided for all of the vaccine components No orders of the defined types were placed in this encounter.   Caryl Pina, MD [facility] 11/16/2019, 10:50 AM

## 2019-11-17 LAB — PSA, TOTAL AND FREE
PSA, Free: <0.02 ng/mL
Prostate Specific Ag, Serum: <0.1 ng/mL (ref 0.0–4.0)

## 2019-11-17 LAB — CBC WITH DIFFERENTIAL/PLATELET
Basophils Absolute: 0 10*3/uL (ref 0.0–0.2)
Basos: 1 %
EOS (ABSOLUTE): 0.1 10*3/uL (ref 0.0–0.4)
Eos: 2 %
Hematocrit: 53.1 % — ABNORMAL HIGH (ref 37.5–51.0)
Hemoglobin: 17.8 g/dL — ABNORMAL HIGH (ref 13.0–17.7)
Immature Grans (Abs): 0 10*3/uL (ref 0.0–0.1)
Immature Granulocytes: 0 %
Lymphocytes Absolute: 1.6 10*3/uL (ref 0.7–3.1)
Lymphs: 20 %
MCH: 31.7 pg (ref 26.6–33.0)
MCHC: 33.5 g/dL (ref 31.5–35.7)
MCV: 95 fL (ref 79–97)
Monocytes Absolute: 0.8 10*3/uL (ref 0.1–0.9)
Monocytes: 10 %
Neutrophils Absolute: 5.4 10*3/uL (ref 1.4–7.0)
Neutrophils: 67 %
Platelets: 148 10*3/uL — ABNORMAL LOW (ref 150–450)
RBC: 5.61 x10E6/uL (ref 4.14–5.80)
RDW: 14.3 % (ref 11.6–15.4)
WBC: 8 10*3/uL (ref 3.4–10.8)

## 2019-11-17 LAB — CMP14+EGFR
ALT: 26 IU/L (ref 0–44)
AST: 30 IU/L (ref 0–40)
Albumin/Globulin Ratio: 1.4 (ref 1.2–2.2)
Albumin: 3.7 g/dL (ref 3.7–4.7)
Alkaline Phosphatase: 66 IU/L (ref 39–117)
BUN/Creatinine Ratio: 12 (ref 10–24)
BUN: 15 mg/dL (ref 8–27)
Bilirubin Total: 2.2 mg/dL — ABNORMAL HIGH (ref 0.0–1.2)
CO2: 24 mmol/L (ref 20–29)
Calcium: 9.4 mg/dL (ref 8.6–10.2)
Chloride: 106 mmol/L (ref 96–106)
Creatinine, Ser: 1.26 mg/dL (ref 0.76–1.27)
GFR calc Af Amer: 64 mL/min/{1.73_m2} (ref >59)
GFR calc non Af Amer: 55 mL/min/{1.73_m2} — ABNORMAL LOW (ref >59)
Globulin, Total: 2.6 g/dL (ref 1.5–4.5)
Glucose: 93 mg/dL (ref 65–99)
Potassium: 4.2 mmol/L (ref 3.5–5.2)
Sodium: 142 mmol/L (ref 134–144)
Total Protein: 6.3 g/dL (ref 6.0–8.5)

## 2019-11-17 LAB — LIPID PANEL
Chol/HDL Ratio: 3.4 ratio (ref 0.0–5.0)
Cholesterol, Total: 115 mg/dL (ref 100–199)
HDL: 34 mg/dL — ABNORMAL LOW (ref >39)
LDL Chol Calc (NIH): 54 mg/dL (ref 0–99)
Triglycerides: 159 mg/dL — ABNORMAL HIGH (ref 0–149)
VLDL Cholesterol Cal: 27 mg/dL (ref 5–40)

## 2019-11-25 DIAGNOSIS — N476 Balanoposthitis: Secondary | ICD-10-CM | POA: Diagnosis not present

## 2020-01-07 ENCOUNTER — Other Ambulatory Visit: Payer: Self-pay | Admitting: Internal Medicine

## 2020-01-07 ENCOUNTER — Other Ambulatory Visit: Payer: Self-pay

## 2020-01-07 ENCOUNTER — Telehealth: Payer: Self-pay | Admitting: Internal Medicine

## 2020-01-07 DIAGNOSIS — R109 Unspecified abdominal pain: Secondary | ICD-10-CM

## 2020-01-07 MED ORDER — HYDROMORPHONE HCL 2 MG PO TABS: 2.0000 mg | ORAL_TABLET | ORAL | 0 refills | Status: DC | PRN

## 2020-01-07 NOTE — Telephone Encounter (Signed)
Pt states that he has been experiencing episodes of intermittent severe stomach pain. They began about 7 months ago but lately it has been happening more often, last one was last Monday. He would like some advice.

## 2020-01-07 NOTE — Telephone Encounter (Signed)
Would recommend that we do a CT scan of his abd/pelvis -- concern for intermittent partial bowel obstructions Labs before, cbc, cmp Does he need more pain meds

## 2020-01-07 NOTE — Progress Notes (Signed)
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  He uses this medication very rarely but has used it successfully in the past I am proceeding with CT scan of the abdomen pelvis given recent acute recurrent similar pain

## 2020-01-07 NOTE — Telephone Encounter (Signed)
Pt reports he has episodes of severe abd pain usually about once every 6-7 months and that Dr. Norman Herrlich prescribes hydromorphone for the pain. Pt reports he had an episode Monday and again last night. These episodes last about 8 hour. Pt wants to know if he needs to be seen or what he needs to do. Please advise.

## 2020-01-07 NOTE — Telephone Encounter (Signed)
Lab orders in epic and pt knows to come for labs, CT of A/P scheduled at [facility] 01/18/20@3 :30pm. Pt to arrive there at 3:15pm, be npo after 11:30am, drink bottle 1 of contrast at 1:30pm, bottle 2 at 2:30pm. Dr. Hilarie Fredrickson sent in refill for pain med. Pt aware.

## 2020-01-12 ENCOUNTER — Other Ambulatory Visit (INDEPENDENT_AMBULATORY_CARE_PROVIDER_SITE_OTHER): Payer: Medicare HMO

## 2020-01-12 DIAGNOSIS — R109 Unspecified abdominal pain: Secondary | ICD-10-CM

## 2020-01-12 LAB — CBC WITH DIFFERENTIAL/PLATELET
Basophils Absolute: 0 10*3/uL (ref 0.0–0.1)
Basophils Relative: 0.5 % (ref 0.0–3.0)
Eosinophils Absolute: 0.2 10*3/uL (ref 0.0–0.7)
Eosinophils Relative: 2.2 % (ref 0.0–5.0)
HCT: 52.7 % — ABNORMAL HIGH (ref 39.0–52.0)
Hemoglobin: 17.9 g/dL — ABNORMAL HIGH (ref 13.0–17.0)
Lymphocytes Relative: 21.6 % (ref 12.0–46.0)
Lymphs Abs: 1.8 10*3/uL (ref 0.7–4.0)
MCHC: 33.9 g/dL (ref 30.0–36.0)
MCV: 96.5 fl (ref 78.0–100.0)
Monocytes Absolute: 0.9 10*3/uL (ref 0.1–1.0)
Monocytes Relative: 11.4 % (ref 3.0–12.0)
Neutro Abs: 5.4 10*3/uL (ref 1.4–7.7)
Neutrophils Relative %: 64.3 % (ref 43.0–77.0)
Platelets: 145 10*3/uL — ABNORMAL LOW (ref 150.0–400.0)
RBC: 5.46 Mil/uL (ref 4.22–5.81)
RDW: 15.1 % (ref 11.5–15.5)
WBC: 8.3 10*3/uL (ref 4.0–10.5)

## 2020-01-12 LAB — COMPREHENSIVE METABOLIC PANEL
ALT: 22 U/L (ref 0–53)
AST: 26 U/L (ref 0–37)
Albumin: 3.7 g/dL (ref 3.5–5.2)
Alkaline Phosphatase: 56 U/L (ref 39–117)
BUN: 12 mg/dL (ref 6–23)
CO2: 30 mEq/L (ref 19–32)
Calcium: 9 mg/dL (ref 8.4–10.5)
Chloride: 104 mEq/L (ref 96–112)
Creatinine, Ser: 1.28 mg/dL (ref 0.40–1.50)
GFR: 54.59 mL/min — ABNORMAL LOW (ref >60.00)
Glucose, Bld: 109 mg/dL — ABNORMAL HIGH (ref 70–99)
Potassium: 4 mEq/L (ref 3.5–5.1)
Sodium: 139 mEq/L (ref 135–145)
Total Bilirubin: 1.8 mg/dL — ABNORMAL HIGH (ref 0.2–1.2)
Total Protein: 6.5 g/dL (ref 6.0–8.3)

## 2020-01-18 ENCOUNTER — Other Ambulatory Visit: Payer: Self-pay

## 2020-01-18 ENCOUNTER — Ambulatory Visit (HOSPITAL_COMMUNITY)
Admission: RE | Admit: 2020-01-18 | Discharge: 2020-01-18 | Disposition: A | Discharge status: Home / Self Care | Payer: Medicare HMO | Source: Ambulatory Visit | Attending: Internal Medicine | Admitting: Internal Medicine

## 2020-01-18 DIAGNOSIS — R109 Unspecified abdominal pain: Secondary | ICD-10-CM

## 2020-01-18 MED ORDER — IOHEXOL 300 MG/ML  SOLN
100.0000 mL | Freq: Once | INTRAMUSCULAR | Status: AC | PRN
  Administered 2020-01-18: 100 mL via INTRAVENOUS

## 2020-01-19 ENCOUNTER — Other Ambulatory Visit: Payer: Self-pay

## 2020-01-19 ENCOUNTER — Telehealth: Payer: Self-pay

## 2020-01-19 DIAGNOSIS — R109 Unspecified abdominal pain: Secondary | ICD-10-CM

## 2020-01-19 NOTE — Telephone Encounter (Signed)
See result note.  

## 2020-01-19 NOTE — Telephone Encounter (Signed)
Received call report regarding CT scan:  1. No CT evidence of acute intra-abdominal pathology to explain the patient's abdominal pain. 2. Single lesion arising from the lower pole of the right kidney appears either enlarged and more dense and shows intermediate density approximately 53 Hounsfield units. This could represent a hyperdense cyst or or solid renal lesion. Consider follow-up in 6 months with MRI or renal protocol CT of the abdomen. 3. 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 4. Bilateral nephrolithiasis without hydronephrosis. 5. Colonic diverticulosis without evidence of diverticulitis.  Dr. Hilarie Fredrickson notified.

## 2020-01-27 ENCOUNTER — Telehealth: Payer: Self-pay | Admitting: Internal Medicine

## 2020-01-27 NOTE — Telephone Encounter (Signed)
Patient is calling with questions in reference to the vascular referral

## 2020-01-27 NOTE — Telephone Encounter (Signed)
Pt has appt with [facility] 5/10@10am .

## 2020-01-28 ENCOUNTER — Encounter: Payer: Self-pay | Admitting: *Deleted

## 2020-02-01 ENCOUNTER — Ambulatory Visit (INDEPENDENT_AMBULATORY_CARE_PROVIDER_SITE_OTHER): Payer: Medicare HMO | Admitting: Family Medicine

## 2020-02-01 ENCOUNTER — Encounter: Payer: Self-pay | Admitting: Family Medicine

## 2020-02-01 ENCOUNTER — Other Ambulatory Visit: Payer: Self-pay

## 2020-02-01 VITALS — BP 113/67 | HR 65 | Temp 97.7°F | Ht 68.0 in | Wt 246.1 lb

## 2020-02-01 DIAGNOSIS — H6123 Impacted cerumen, bilateral: Secondary | ICD-10-CM

## 2020-02-01 DIAGNOSIS — S161XXA Strain of muscle, fascia and tendon at neck level, initial encounter: Secondary | ICD-10-CM | POA: Diagnosis not present

## 2020-02-01 MED ORDER — BACLOFEN 10 MG PO TABS: 10.0000 mg | ORAL_TABLET | Freq: Every evening | ORAL | 1 refills | Status: DC | PRN

## 2020-02-01 NOTE — Progress Notes (Signed)
BP 113/67   Pulse 65   Temp 97.7 F (36.5 C) (Temporal)   Ht 5' 8"  (1.727 m)   Wt 246 lb 2 oz (111.6 kg)   BMI 37.42 kg/m    Subjective:   Patient ID: Luis Deleon, male    DOB: 21-Jun-1941, 79 y.o.   MRN: [id number]  HPI: Luis Deleon is a 79 y.o. male presenting on 02/01/2020 for Neck Pain   HPI Patient is coming in complaining of neck pain/achiness that has been going on over the past 3 weeks.  He says 1 day he woke up funny and has been having tightness and achiness on that side of his neck since then.  He says he has used some icy hot and some Tylenol which helps but have not gotten to go away.  He says the only thing that really makes it worse is when he tries to push down on his arms like getting up from a chair.  He says that nothing else seems to make it better or worse.  He thought he initially pulled a muscle but since it has not gotten much better that is what got him concerned and brought him in today.  He denies any pain radiating anywhere else.  Relevant past medical, surgical, family and social history reviewed and updated as indicated. Interim medical history since our last visit reviewed. Allergies and medications reviewed and updated.  Review of Systems  Constitutional: Negative for chills and fever.  Respiratory: Negative for shortness of breath and wheezing.   Cardiovascular: Negative for chest pain and leg swelling.  Musculoskeletal: Positive for neck pain. Negative for back pain and gait problem.  Skin: Negative for rash.  All other systems reviewed and are negative.   Per HPI unless specifically indicated above  Objective:   BP 113/67   Pulse 65   Temp 97.7 F (36.5 C) (Temporal)   Ht 5' 8"  (1.727 m)   Wt 246 lb 2 oz (111.6 kg)   BMI 37.42 kg/m   Wt Readings from Last 3 Encounters:  02/01/20 246 lb 2 oz (111.6 kg)  11/16/19 251 lb 6.4 oz (114 kg)  06/24/19 247 lb (112 kg)    Physical Exam Vitals and nursing note reviewed.    Constitutional:      General: He is not in acute distress.    Appearance: He is well-developed. He is not diaphoretic.  HENT:     Right Ear: There is impacted cerumen.     Left Ear: There is impacted cerumen.  Eyes:     General: No scleral icterus.    Conjunctiva/sclera: Conjunctivae normal.  Neck:     Thyroid: No thyromegaly.  Musculoskeletal:        General: Normal range of motion.     Cervical back: No rigidity. Pain with movement (Pain with pushing up from a sitting.) and muscular tenderness present.  Skin:    General: Skin is warm and dry.     Findings: No rash.  Neurological:     Mental Status: He is alert and oriented to person, place, and time.     Coordination: Coordination normal.  Psychiatric:        Behavior: Behavior normal.     Assessment & Plan:   Problem List Items Addressed This Visit    None    Visit Diagnoses    Strain of neck muscle, initial encounter    -  Primary   Relevant Medications   baclofen (LIORESAL) 10  MG tablet   Bilateral impacted cerumen       Relevant Medications   baclofen (LIORESAL) 10 MG tablet      Nurse to lavage cerumen, patient tolerated well. Follow up plan: Return if symptoms worsen or fail to improve.  Counseling provided for all of the vaccine components No orders of the defined types were placed in this encounter.   Caryl Pina, MD [facility] 02/01/2020, 1:25 PM

## 2020-02-04 ENCOUNTER — Encounter: Payer: Self-pay | Admitting: Internal Medicine

## 2020-02-04 ENCOUNTER — Ambulatory Visit: Payer: Medicare HMO | Admitting: Internal Medicine

## 2020-02-04 VITALS — BP 120/70 | HR 64 | Temp 97.8°F | Ht 68.0 in | Wt 240.8 lb

## 2020-02-04 DIAGNOSIS — N289 Disorder of kidney and ureter, unspecified: Secondary | ICD-10-CM | POA: Diagnosis not present

## 2020-02-04 DIAGNOSIS — Z8719 Personal history of other diseases of the digestive system: Secondary | ICD-10-CM | POA: Diagnosis not present

## 2020-02-04 DIAGNOSIS — K551 Chronic vascular disorders of intestine: Secondary | ICD-10-CM

## 2020-02-04 DIAGNOSIS — R109 Unspecified abdominal pain: Secondary | ICD-10-CM

## 2020-02-04 DIAGNOSIS — K660 Peritoneal adhesions (postprocedural) (postinfection): Secondary | ICD-10-CM

## 2020-02-04 DIAGNOSIS — K565 Intestinal adhesions [bands], unspecified as to partial versus complete obstruction: Secondary | ICD-10-CM

## 2020-02-04 NOTE — Progress Notes (Signed)
Subjective:    Patient ID: Luis Deleon, male    DOB: May 12, 1941, 79 y.o.   MRN: [id number]  HPI Luis Deleon is a 79 year old male with a history of remote ileal Crohn's disease status post ileocecectomy in 2003, history of ventral hernia with repair and mesh placement, history of intermittent partial small bowel obstructions, history of SIBO treated in the past with Augmentin, history of fatty liver who is here for follow-up.  I saw him last in the office in November 2018 but he came for a colonoscopy in September 2020 which was performed by Dr. Silverio Decamp in my absence.  He is here alone today.  Colonoscopy on 06/24/2019 revealed prior side to side ileocolonic anastomosis in the proximal ascending colon which was healthy-appearing.  Scattered diverticula in the sigmoid and descending colon.  Small internal hemorrhoids.  No polypoid lesions.  He reports that earlier in the month he had an episode of severe abdominal pain in the mid abdomen.  This is what he has had periodically several times per year over the last several years.  The pain builds to a peak and then subsides and then builds again.  In the past it has been associated with abdominal distention and nausea.  No vomiting on this most recent episode but he has had vomiting in the past.  Typically stools are loose once the painful attacks resolved.  He uses Dilaudid to try to prevent severe pain and prevent the need to go to the ER.  Normally he will have an attack that will last 6 to 8 hours and then be gone for months though on this most recent episode he had another attack the very next day.  This led me to perform a CT scan of his abdomen pelvis.  Subsequently after the second day his pain is resolved and not recurred.  No pain today.  Good appetite.  Regular bowel movements.  No blood in stool or melena.  CT scan did show a right kidney lesion and he reports that he has follow-up with Dr. Junious Silk at [facility] urology for similar lesions  in the past.  He has an appointment with Dr. Junious Silk in August.  Review of Systems As per HPI, otherwise negative  Current Medications, Allergies, Past Medical History, Past Surgical History, Family History and Social History were reviewed in [employer] record.     Objective:   Physical Exam BP 120/70   Pulse 64   Temp 97.8 F (36.6 C)   Ht 5' 8"  (1.727 m)   Wt 240 lb 12.8 oz (109.2 kg)   BMI 36.61 kg/m  Gen: awake, alert, NAD HEENT: anicteric CV: RRR, no mrg Pulm: CTA b/l Abd: soft, NT/ND, +BS throughout Ext: no c/c/e Neuro: nonfocal  CT ABDOMEN AND PELVIS WITH CONTRAST   TECHNIQUE: Multidetector CT imaging of the abdomen and pelvis was performed using the standard protocol following bolus administration of intravenous contrast.   CONTRAST:  148m OMNIPAQUE IOHEXOL 300 MG/ML  SOLN   COMPARISON:  12/21/2011 and 06/20/2016   FINDINGS: Lower chest: Basilar atelectasis. No consolidation or evidence of pleural effusion. The pleural and parenchymal scarring at the lung bases. The three-vessel coronary artery calcification.   Hepatobiliary: Stable appearance of low-density hepatic lesions. Previously characterized as hepatic cysts on MRI.   Pancreas: Pancreas is normal without focal lesion or ductal dilation. No signs of peripancreatic inflammation.   Spleen: Small low-density lesion in the spleen unchanged and nonspecific based on comparison with MRI, likely  benign.   Adrenals/Urinary Tract: Adrenal glands are normal. No hydronephrosis. Multiple renal cysts with similar appearance to the previous MR evaluation. Single lesion arising from the lower pole of the right kidney appears either new or enlarged and shows intermediate density approximately 53 Hounsfield units (image 51, series 5) measuring approximately 13 mm.   Nephrolithiasis bilaterally approximately 7 calculi in the right kidney largest approximately 4-5 mm.   Small calculus  in the lower pole the left kidney.   Stomach/Bowel: Colonic diverticulosis. No evidence of diverticulitis.   Vascular/Lymphatic: Calcified and noncalcified plaque of the abdominal aorta without evidence of aneurysm. No evidence of adenopathy.   No pelvic lymphadenopathy.   Reproductive: Post prostatectomy.   Other: Post ventral abdominal wall reconstruction with mesh similar to previous imaging.   Musculoskeletal: No acute bone finding or destructive bone process.   IMPRESSION: 1. No CT evidence of acute intra-abdominal pathology to explain the patient's abdominal pain. 2. Single lesion arising from the lower pole of the right kidney appears either enlarged and more dense and shows intermediate density approximately 53 Hounsfield units. This could represent a hyperdense cyst or or solid renal lesion. Consider follow-up in 6 months with MRI or renal protocol CT of the abdomen. 3. 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 4. Bilateral nephrolithiasis without hydronephrosis. 5. Colonic diverticulosis without evidence of diverticulitis.   These results will be called to the ordering clinician or representative by the Radiologist Assistant, and communication documented in the PACS or [employer].   Aortic Atherosclerosis (ICD10-I70.0).     Electronically Signed   By: Zetta Bills M.D.   On: 01/18/2020 17:16       Assessment & Plan:   79 year old male with a history of remote ileal Crohn's disease status post ileocecectomy in 2003, history of ventral hernia with repair and mesh placement, history of intermittent partial small bowel obstructions, history of SIBO treated in the past with Augmentin, history of fatty liver who is here for follow-up.  1.  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  There is no  evidence for recurrent Crohn's disease and he has maintained a long postsurgical remission.  The CT scan did not show any evidence of obstruction and his intermittent obstructions are likely due to intra-abdominal adhesions.  It did show vascular disease involving the origin of the SMA and so I have referred him to see vascular surgery to get their opinion regarding his mesenteric blood flow as it relates to his abdominal pain.  We discussed how an operation for lysis of adhesions may help but could also lead to more adhesions eventually. --He can use Dilaudid 2 mg by mouth and redose after 15 minutes if not improving up to 4 mg.  He uses this very sparingly and only for attacks of this abdominal pain.  He knows to go to the ER if the abdominal pain persists or worsens.  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  While I think his abdominal pain is most likely explained by intermittent obstructions due to adhesions I want to get Dr. Stephens Shire opinion regarding mesenteric blood flow and his abdominal pain.  This appointment is set for 02/22/2020.  3.  Remote ileal Crohn's disease --prolonged clinical remission after ileocecectomy.  No evidence of active Crohn's disease by imaging or recent colonoscopy.  4.  Right renal lesion --seen by CT.  Slightly larger than prior imaging.  Warrants further surveillance imaging but will defer this to Dr. Junious Silk who he will see in August 2021  30 minutes total spent today including patient facing time, coordination of care, reviewing medical history/procedures/pertinent radiology studies, and documentation of the encounter.

## 2020-02-04 NOTE — Patient Instructions (Signed)
Use dilaudid only as needed for severe abdominal pain.  Please keep your appointment with Dr Trula Slade.  Please keep your appointment with Dr Junious Silk for your kidney lesion.  Please follow up with Dr Hilarie Fredrickson as needed.  If you are age 77 or older, your body mass index should be between 23-30. Your Body mass index is 36.61 kg/m. If this is out of the aforementioned range listed, please consider follow up with your Primary Care Provider.  If you are age 49 or younger, your body mass index should be between 19-25. Your Body mass index is 36.61 kg/m. If this is out of the aformentioned range listed, please consider follow up with your Primary Care Provider.

## 2020-02-09 ENCOUNTER — Ambulatory Visit (INDEPENDENT_AMBULATORY_CARE_PROVIDER_SITE_OTHER): Payer: Medicare HMO | Admitting: *Deleted

## 2020-02-09 DIAGNOSIS — Z0000 Encounter for general adult medical examination without abnormal findings: Secondary | ICD-10-CM | POA: Diagnosis not present

## 2020-02-09 NOTE — Progress Notes (Signed)
MEDICARE ANNUAL WELLNESS VISIT  02/09/2020  Telephone Visit Disclaimer This Medicare AWV was conducted by telephone due to national recommendations for restrictions regarding the COVID-19 Pandemic (e.g. social distancing).  I verified, using two identifiers, that I am speaking with Luis Deleon or their authorized healthcare agent. I discussed the limitations, risks, security, and privacy concerns of performing an evaluation and management service by telephone and the potential availability of an in-person appointment in the future. The patient expressed understanding and agreed to proceed.   Subjective:  Luis Deleon is a 79 y.o. male patient of Dettinger, Fransisca Kaufmann, MD who had a Medicare Annual Wellness Visit today via telephone. Zyrion is Retired and lives with their spouse. he has 1 child. he reports that he is socially active and does interact with friends/family regularly. he is not physically active and enjoys shooting sports.  Patient Care Team: Dettinger, Fransisca Kaufmann, MD as PCP - General (Family Medicine) Lorretta Harp, MD as PCP - Cardiology (Cardiology) Festus Aloe, MD as Consulting Physician (Urology) Pyrtle, Lajuan Lines, MD as Consulting Physician (Gastroenterology)  Advanced Directives 02/09/2020 12/19/2017 12/19/2017 08/23/2017 08/09/2017 05/06/2017 02/02/2015  Does Patient Have a Medical Advance Directive? No No No No No No No  Would patient like information on creating a medical advance directive? No - Patient declined No - Patient declined - No - Patient declined No - Patient declined No - Patient declined Yes - Educational materials given    Hospital Utilization Over the Past 12 Months: # of hospitalizations or ER visits: 0 # of surgeries: 0  Review of Systems    Patient reports that his overall health is worse compared to last year.  History obtained from chart review and the patient  Patient Reported Readings (BP, Pulse, CBG, Weight, etc) none  Pain Assessment  Pain : No/denies pain     Current Medications & Allergies (verified) Allergies as of 02/09/2020      Reactions   Colchicine Diarrhea, Nausea And Vomiting, Other (See Comments)   REACTION: nausea, diarrhea and interactions with 21m      Medication List       Accurate as of February 09, 2020  2:09 PM. If you have any questions, ask your nurse or doctor.        allopurinol 100 MG tablet Commonly known as: ZYLOPRIM Take 1 tablet (100 mg total) by mouth 2 (two) times daily.   aspirin 81 MG EC tablet Take 81 mg by mouth daily.   atorvastatin 40 MG tablet Commonly known as: LIPITOR Take 1 tablet (40 mg total) by mouth daily at 6 PM.   baclofen 10 MG tablet Commonly known as: LIORESAL Take 1 tablet (10 mg total) by mouth at bedtime as needed for muscle spasms.   gemfibrozil 600 MG tablet Commonly known as: LOPID Take 1 tablet (600 mg total) by mouth daily.   hydrochlorothiazide 12.5 MG capsule Commonly known as: MICROZIDE Take 1 capsule (12.5 mg total) by mouth daily. As directed   HYDROmorphone 2 MG tablet Commonly known as: Dilaudid Take 1 tablet (2 mg total) by mouth every 4 (four) hours as needed for severe pain.   Klor-Con M20 20 MEQ tablet Generic drug: potassium chloride SA Take 2 tablets (40 mEq total) by mouth daily.   losartan 50 MG tablet Commonly known as: COZAAR Take 1 tablet (50 mg total) by mouth daily.   metoprolol tartrate 50 MG tablet Commonly known as: LOPRESSOR Take 1 tablet (50 mg total) by  mouth 2 (two) times daily.   nystatin-triamcinolone cream Commonly known as: MYCOLOG II Apply 1 application topically 2 (two) times daily.   Omega-3 1000 MG Caps Take 2 capsules by mouth 2 (two) times daily.   Vitamin D3 25 MCG (1000 UT) Caps Take 4 capsules by mouth every morning.       History (reviewed): Past Medical History:  Diagnosis Date  . Abnormal LFTs (liver function tests)   . Arthritis   . Crohn disease ([place])   . Diverticulosis   .  Fatty liver   . Gout   . Hepatic cyst   . Hyperlipemia   . Hyperplastic colon polyp 2012  . Hypertension   . Internal hemorrhoids   . Nephrolithiasis   . Polycythemia   . Prostate cancer ([place])    8 or 9 years  . Small bowel obstruction ([place]) 2003  . Tubular adenoma of colon   . Vitamin D deficiency    Past Surgical History:  Procedure Laterality Date  . APPENDECTOMY  2003  . BACTERIAL OVERGROWTH TEST N/A 06/15/2016   Procedure: BACTERIAL OVERGROWTH TEST;  Surgeon: Md Physician Gastroenterology, MD;  Location: [facility];  Service: Gastroenterology;  Laterality: N/A;  . CYSTOSCOPY  2019   dorsal slit  . EXPLORATORY LAPAROTOMY W/ BOWEL RESECTION  2003  . LITHOTRIPSY  1980  . PROSTATECTOMY    . TONSILLECTOMY    . VENTRAL HERNIA REPAIR     2 times   Family History  Problem Relation Age of Onset  . Heart disease Mother   . Cervical cancer Mother   . Heart disease Father   . Heart failure Father   . Dislocations Daughter 8       Bilateral knee  . Heart disease Maternal Uncle   . Colon cancer Neg Hx   . Esophageal cancer Neg Hx   . Rectal cancer Neg Hx   . Stomach cancer Neg Hx   . Colon polyps Neg Hx    Social History   Socioeconomic History  . Marital status: Married    Spouse name: Katharine Look  . Number of children: 1  . Years of education: 66  . Highest education level: 12th grade  Occupational History  . Occupation: Retired     Comment: Event organiser  Tobacco Use  . Smoking status: Former Smoker    Packs/day: 1.00    Years: 38.00    Pack years: 38.00    Types: Cigarettes    Quit date: 03/19/1991    Years since quitting: 28.9  . Smokeless tobacco: Current User    Types: Chew  Substance and Sexual Activity  . Alcohol use: No  . Drug use: No  . Sexual activity: Never  Other Topics Concern  . Not on file  Social History Narrative  . Not on file   Social Determinants of Health   Financial Resource Strain:   . Difficulty of Paying Living Expenses:    Food Insecurity:   . Worried About Charity fundraiser in the Last Year:   . Arboriculturist in the Last Year:   Transportation Needs:   . Film/video editor (Medical):   Marland Kitchen Lack of Transportation (Non-Medical):   Physical Activity:   . Days of Exercise per Week:   . Minutes of Exercise per Session:   Stress:   . Feeling of Stress :   Social Connections:   . Frequency of Communication with Friends and Family:   . Frequency of Social  Gatherings with Friends and Family:   . Attends Religious Services:   . Active Member of Clubs or Organizations:   . Attends Archivist Meetings:   Marland Kitchen Marital Status:     Activities of Daily Living In your present state of health, do you have any difficulty performing the following activities: 02/09/2020  Hearing? N  Vision? N  Difficulty concentrating or making decisions? N  Walking or climbing stairs? N  Dressing or bathing? N  Doing errands, shopping? N  Preparing Food and eating ? N  Using the Toilet? N  In the past six months, have you accidently leaked urine? N  Do you have problems with loss of bowel control? N  Managing your Medications? N  Managing your Finances? N  Housekeeping or managing your Housekeeping? N  Some recent data might be hidden    Patient Education/ Literacy How often do you need to have someone help you when you read instructions, pamphlets, or other written materials from your doctor or pharmacy?: 1 - Never What is the last grade level you completed in school?: 12  Exercise Current Exercise Habits: The patient does not participate in regular exercise at present, Exercise limited by: None identified  Diet Patient reports consuming 3 meals a day and 3 snack(s) a day Patient reports that his primary diet is: Regular Patient reports that she does have regular access to food.   Depression Screen PHQ 2/9 Scores 02/09/2020 02/01/2020 11/16/2019 11/20/2018 07/17/2018 05/19/2018 03/05/2018  PHQ - 2 Score 0 0 0 0 0 0  0     Fall Risk Fall Risk  02/09/2020 02/01/2020 11/16/2019 11/20/2018 07/17/2018  Falls in the past year? 0 0 0 0 No     Objective:  Luis Deleon seemed alert and oriented and he participated appropriately during our telephone visit.  Blood Pressure Weight BMI  BP Readings from Last 3 Encounters:  02/04/20 120/70  02/01/20 113/67  11/16/19 122/69   Wt Readings from Last 3 Encounters:  02/04/20 240 lb 12.8 oz (109.2 kg)  02/01/20 246 lb 2 oz (111.6 kg)  11/16/19 251 lb 6.4 oz (114 kg)   BMI Readings from Last 1 Encounters:  02/04/20 36.61 kg/m    *Unable to obtain current vital signs, weight, and BMI due to telephone visit type  Hearing/Vision  . Onnie did not seem to have difficulty with hearing/understanding during the telephone conversation . Reports that he has not had a formal eye exam by an eye care professional within the past year . Reports that he has not had a formal hearing evaluation within the past year *Unable to fully assess hearing and vision during telephone visit type  Cognitive Function: 6CIT Screen 02/09/2020  What Year? 0 points  What month? 0 points  What time? 0 points  Count back from 20 0 points  Months in reverse 0 points  Repeat phrase 2 points  Total Score 2   (Normal:0-7, Significant for Dysfunction: >8)  Normal Cognitive Function Screening: Yes   Immunization & Health Maintenance Record Immunization History  Administered Date(s) Administered  . Fluad Quad(high Dose 65+) 07/07/2019  . Influenza Whole 07/15/2010  . Influenza, High Dose Seasonal PF 07/31/2016, 07/15/2017, 07/17/2018  . Influenza,inj,Quad PF,6+ Mos 08/04/2013, 07/19/2014, 09/19/2015  . Pneumococcal Conjugate-13 08/15/2013  . Pneumococcal Polysaccharide-23 08/08/2011  . Td 07/15/2005  . Tdap 08/20/2011  . Zoster 12/24/2013    Health Maintenance  Topic Date Due  . COVID-19 Vaccine (1) Never done  . INFLUENZA VACCINE  05/15/2020  . TETANUS/TDAP  08/19/2021  .  COLONOSCOPY  06/23/2022  . PNA vac Low Risk Adult  Completed       Assessment  This is a routine wellness examination for Luis Deleon.  Health Maintenance: Due or Overdue Health Maintenance Due  Topic Date Due  . COVID-19 Vaccine (1) Never done    Luis Deleon does not need a referral for Community Assistance: Care Management:   no Social Work:    no Prescription Assistance:  no Nutrition/Diabetes Education:  no   Plan:  Personalized Goals Goals Addressed            This Visit's Progress   . Patient Stated       Cut back on snacks, especially after 5 pm      Personalized Health Maintenance & Screening Recommendations  Advanced directives: has NO advanced directive - not interested in additional information Shingles vaccine  Lung Cancer Screening Recommended: yes (Low Dose CT Chest recommended if Age 70-80 years, 30 pack-year currently smoking OR have quit w/in past 15 years) Hepatitis C Screening recommended: no HIV Screening recommended: no  Advanced Directives: Written information was not prepared per patient's request.  Referrals & Orders No orders of the defined types were placed in this encounter.   Follow-up Plan . Follow-up with Dettinger, Fransisca Kaufmann, MD as planned on 05/16/20 . Pt has an eye appt coming up in June 2021 . Pt has had both covid vaccines . Pt needs shingles vaccines and will discuss at next visit? . Pt would benefit from a chest CT since he smoked for over 30 years. . Patient voices no health concerns. . AVS printed and mailed to pt    I have personally reviewed and noted the following in the patient's chart:   . Medical and social history . Use of alcohol, tobacco or illicit drugs  . Current medications and supplements . Functional ability and status . Nutritional status . Physical activity . Advanced directives . List of other physicians . Hospitalizations, surgeries, and ER visits in previous 12 months . Vitals .  Screenings to include cognitive, depression, and falls . Referrals and appointments  In addition, I have reviewed and discussed with Luis Deleon certain preventive protocols, quality metrics, and best practice recommendations. A written personalized care plan for preventive services as well as general preventive health recommendations is available and can be mailed to the patient at his request.      Rana Snare, LPN  03/03/8021

## 2020-02-19 ENCOUNTER — Telehealth (HOSPITAL_COMMUNITY): Payer: Self-pay

## 2020-02-19 NOTE — Telephone Encounter (Signed)

## 2020-02-22 ENCOUNTER — Encounter: Payer: Self-pay | Admitting: Surgery

## 2020-02-22 ENCOUNTER — Ambulatory Visit (INDEPENDENT_AMBULATORY_CARE_PROVIDER_SITE_OTHER): Payer: Medicare HMO | Admitting: Surgery

## 2020-02-22 ENCOUNTER — Other Ambulatory Visit: Payer: Self-pay

## 2020-02-22 VITALS — BP 124/74 | HR 66 | Temp 98.1°F | Resp 20 | Ht 68.0 in | Wt 248.0 lb

## 2020-02-22 DIAGNOSIS — K551 Chronic vascular disorders of intestine: Secondary | ICD-10-CM

## 2020-02-22 NOTE — Progress Notes (Signed)
[facility] [place]  Patient name: Luis Deleon MRN: [id number] DOB: [birth year] Sex: male   REQUESTING PROVIDER:    Dr. Hilarie Fredrickson   REASON FOR CONSULT:    GI vascular disease  HISTORY OF PRESENT ILLNESS:   Luis Deleon is a 79 y.o. male, who is referred for evaluation abdominal pain.  Patient has a history of Crohn's disease.  He is status post ileocecectomy in 2003.  He has also had a ventral hernia repair with mesh and several partial small bowel obstructions.  He continues to have intermittent abdominal pain.  He states that he has severe episodes about every 3 months that last 8 to 12 hours.  They are associated with severe cramping.  The pain comes in waves.  He will have more frequent dull abdominal pain.  He denies any postprandial pain.  He denies fear of food.  He denies weight loss.  The patient is a former smoker.  He takes a statin for hypercholesterolemia.  He is medically managed for hypertension.  PAST MEDICAL HISTORY    Past Medical History:  Diagnosis Date  . Abnormal LFTs (liver function tests)   . Arthritis   . Crohn disease ([place])   . Diverticulosis   . Fatty liver   . Gout   . Hepatic cyst   . Hyperlipemia   . Hyperplastic colon polyp 2012  . Hypertension   . Internal hemorrhoids   . Nephrolithiasis   . Polycythemia   . Prostate cancer ([place])    8 or 9 years  . Small bowel obstruction ([place]) 2003  . Tubular adenoma of colon   . Vitamin D deficiency      FAMILY HISTORY   Family History  Problem Relation Age of Onset  . Heart disease Mother   . Cervical cancer Mother   . Heart disease Father   . Heart failure Father   . Dislocations Daughter 8       Bilateral knee  . Heart disease Maternal Uncle   . Colon cancer Neg Hx   . Esophageal cancer Neg Hx   . Rectal cancer Neg Hx   . Stomach cancer Neg Hx   . Colon polyps Neg Hx     SOCIAL HISTORY:   Social History   Socioeconomic  History  . Marital status: Married    Spouse name: Katharine Look  . Number of children: 1  . Years of education: 66  . Highest education level: 12th grade  Occupational History  . Occupation: Retired     Comment: Event organiser  Tobacco Use  . Smoking status: Former Smoker    Packs/day: 1.00    Years: 38.00    Pack years: 38.00    Types: Cigarettes    Quit date: 03/19/1991    Years since quitting: 28.9  . Smokeless tobacco: Current User    Types: Chew  Substance and Sexual Activity  . Alcohol use: No  . Drug use: No  . Sexual activity: Never  Other Topics Concern  . Not on file  Social History Narrative  . Not on file   Social Determinants of Health   Financial Resource Strain:   . Difficulty of Paying Living Expenses:   Food Insecurity:   . Worried About Charity fundraiser in the Last Year:   . Arboriculturist in the Last Year:   Transportation Needs:   . Film/video editor (Medical):   Marland Kitchen Lack of Transportation (Non-Medical):  Physical Activity:   . Days of Exercise per Week:   . Minutes of Exercise per Session:   Stress:   . Feeling of Stress :   Social Connections:   . Frequency of Communication with Friends and Family:   . Frequency of Social Gatherings with Friends and Family:   . Attends Religious Services:   . Active Member of Clubs or Organizations:   . Attends Archivist Meetings:   Marland Kitchen Marital Status:   Intimate Partner Violence:   . Fear of Current or Ex-Partner:   . Emotionally Abused:   Marland Kitchen Physically Abused:   . Sexually Abused:     ALLERGIES:    Allergies  Allergen Reactions  . Colchicine Diarrhea, Nausea And Vomiting and Other (See Comments)    REACTION: nausea, diarrhea and interactions with 70m    CURRENT MEDICATIONS:    Current Outpatient Medications  Medication Sig Dispense Refill  . allopurinol (ZYLOPRIM) 100 MG tablet Take 1 tablet (100 mg total) by mouth 2 (two) times daily. 180 tablet 3  . aspirin 81 MG EC tablet Take  81 mg by mouth daily.      .Marland Kitchenatorvastatin (LIPITOR) 40 MG tablet Take 1 tablet (40 mg total) by mouth daily at 6 PM. 90 tablet 3  . baclofen (LIORESAL) 10 MG tablet Take 1 tablet (10 mg total) by mouth at bedtime as needed for muscle spasms. 30 each 1  . Cholecalciferol (VITAMIN D3) 1000 UNITS CAPS Take 4 capsules by mouth every morning.     .Marland Kitchengemfibrozil (LOPID) 600 MG tablet Take 1 tablet (600 mg total) by mouth daily. 90 tablet 3  . hydrochlorothiazide (MICROZIDE) 12.5 MG capsule Take 1 capsule (12.5 mg total) by mouth daily. As directed 90 capsule 3  . HYDROmorphone (DILAUDID) 2 MG tablet Take 1 tablet (2 mg total) by mouth every 4 (four) hours as needed for severe pain. 30 tablet 0  . KLOR-CON M20 20 MEQ tablet Take 2 tablets (40 mEq total) by mouth daily. 180 tablet 2  . losartan (COZAAR) 50 MG tablet Take 1 tablet (50 mg total) by mouth daily. 90 tablet 3  . metoprolol tartrate (LOPRESSOR) 50 MG tablet Take 1 tablet (50 mg total) by mouth 2 (two) times daily. 180 tablet 3  . nystatin-triamcinolone (MYCOLOG II) cream Apply 1 application topically 2 (two) times daily. 30 g 3  . OMEGA-3 1000 MG CAPS Take 2 capsules by mouth 2 (two) times daily.     No current facility-administered medications for this visit.    REVIEW OF SYSTEMS:   [X]  denotes positive finding, [ ]  denotes negative finding Cardiac  Comments:  Chest pain or chest pressure:    Shortness of breath upon exertion:    Short of breath when lying flat:    Irregular heart rhythm:        Vascular    Pain in calf, thigh, or hip brought on by ambulation:    Pain in feet at night that wakes you up from your sleep:     Blood clot in your veins:    Leg swelling:         Pulmonary    Oxygen at home:    Productive cough:     Wheezing:         Neurologic    Sudden weakness in arms or legs:     Sudden numbness in arms or legs:     Sudden onset of difficulty speaking or slurred speech:  Temporary loss of vision in one eye:      Problems with dizziness:         Gastrointestinal    Blood in stool:      Vomited blood:         Genitourinary    Burning when urinating:     Blood in urine:        Psychiatric    Major depression:         Hematologic    Bleeding problems:    Problems with blood clotting too easily:        Skin    Rashes or ulcers:        Constitutional    Fever or chills:     PHYSICAL EXAM:   Vitals:   02/22/20 0954  BP: 124/74  Pulse: 66  Resp: 20  Temp: 98.1 F (36.7 C)  SpO2: 95%  Weight: 248 lb (112.5 kg)  Height: 5' 8"  (1.727 m)    GENERAL: The patient is a well-nourished male, in no acute distress. The vital signs are documented above. CARDIAC: There is a regular rate and rhythm.  VASCULAR: Palpable pedal pulses. PULMONARY: Nonlabored respirations ABDOMEN: Soft and non-tender.  MUSCULOSKELETAL: There are no major deformities or cyanosis. NEUROLOGIC: No focal weakness or paresthesias are detected. SKIN: There are no ulcers or rashes noted. PSYCHIATRIC: The patient has a normal affect.  STUDIES:   I have reviewed his CT scan with the following findings: 1. No CT evidence of acute intra-abdominal pathology to explain the patient's abdominal pain. 2. Single lesion arising from the lower pole of the right kidney appears either enlarged and more dense and shows intermediate density approximately 53 Hounsfield units. This could represent a hyperdense cyst or or solid renal lesion. Consider follow-up in 6 months with MRI or renal protocol CT of the abdomen. 3. 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 4. Bilateral nephrolithiasis without hydronephrosis. 5. Colonic diverticulosis without evidence of diverticulitis. 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  The degree of stenosis is difficult to ascertain but it does appear to be  significant.  His IMA and celiac are widely patent.  The patient does not endorse classic symptoms for mesenteric ischemia.  I do not think his mesenteric stenosis is responsible for his abdominal pain.  I discussed with the patient that his abdominal pain appears to be more related to scar tissue and adhesive disease from his prior abdominal surgeries.  No vascular intervention is recommended at this time.  I went over the signs and symptoms to look out for for mesenteric ischemia including postprandial abdominal pain.  If the patient's symptoms change or become more consistent with mesenteric ischemia we could proceed with angiogram however I do not think that that is necessary at this time.   Leia Alf, MD, FACS [facility] [phone] Pager [phone]

## 2020-02-23 DIAGNOSIS — R69 Illness, unspecified: Secondary | ICD-10-CM | POA: Diagnosis not present

## 2020-02-25 ENCOUNTER — Other Ambulatory Visit: Payer: Self-pay | Admitting: Family Medicine

## 2020-02-25 DIAGNOSIS — H6123 Impacted cerumen, bilateral: Secondary | ICD-10-CM

## 2020-02-25 DIAGNOSIS — S161XXA Strain of muscle, fascia and tendon at neck level, initial encounter: Secondary | ICD-10-CM

## 2020-02-26 ENCOUNTER — Encounter: Payer: Self-pay | Admitting: Family

## 2020-02-26 ENCOUNTER — Other Ambulatory Visit: Payer: Self-pay

## 2020-02-26 ENCOUNTER — Ambulatory Visit (INDEPENDENT_AMBULATORY_CARE_PROVIDER_SITE_OTHER): Payer: Medicare HMO | Admitting: Family

## 2020-02-26 VITALS — BP 114/65 | HR 67 | Temp 98.3°F | Ht 68.0 in | Wt 247.2 lb

## 2020-02-26 DIAGNOSIS — H6122 Impacted cerumen, left ear: Secondary | ICD-10-CM | POA: Diagnosis not present

## 2020-02-26 DIAGNOSIS — H1131 Conjunctival hemorrhage, right eye: Secondary | ICD-10-CM | POA: Diagnosis not present

## 2020-02-26 NOTE — Progress Notes (Signed)
Subjective:    Patient ID: Luis Deleon, male    DOB: [birth year], 79 y.o.   MRN: [id number]  Chief Complaint  Patient presents with  . Conjunctivitis  . Cerumen Impaction    LEFT EAR    Conjunctivitis  The current episode started 2 days ago. The onset was sudden. The problem occurs continuously. The problem has been gradually improving. The problem is mild. Associated symptoms include hearing loss and eye redness. Pertinent negatives include no decreased vision, no double vision, no eye itching, no photophobia, no congestion, no ear discharge, no headaches, no rhinorrhea, no sore throat, no eye discharge and no eye pain. The right eye is affected.  Ear Fullness  There is pain in the left ear. This is a recurrent problem. The current episode started more than 1 month ago. Associated symptoms include hearing loss. Pertinent negatives include no ear discharge, headaches, rhinorrhea or sore throat.      Review of Systems  HENT: Positive for hearing loss. Negative for congestion, ear discharge, rhinorrhea and sore throat.   Eyes: Positive for redness. Negative for double vision, photophobia, pain, discharge and itching.  Neurological: Negative for headaches.  All other systems reviewed and are negative.      Objective:   Physical Exam Vitals reviewed.  Constitutional:      General: He is not in acute distress.    Appearance: He is well-developed.  HENT:     Head: Normocephalic.     Right Ear: Tympanic membrane normal.     Left Ear: Tympanic membrane normal.  Eyes:     General:        Right eye: No discharge.        Left eye: No discharge.     Conjunctiva/sclera:     Right eye: Hemorrhage present.     Pupils: Pupils are equal, round, and reactive to light.  Neck:     Thyroid: No thyromegaly.  Cardiovascular:     Rate and Rhythm: Normal rate and regular rhythm.     Heart sounds: Normal heart sounds. No murmur.  Pulmonary:     Effort: Pulmonary effort is normal. No  respiratory distress.     Breath sounds: Normal breath sounds. No wheezing.  Abdominal:     General: Bowel sounds are normal. There is no distension.     Palpations: Abdomen is soft.     Tenderness: There is no abdominal tenderness.  Musculoskeletal:        General: No tenderness. Normal range of motion.     Cervical back: Normal range of motion and neck supple.  Skin:    General: Skin is warm and dry.     Findings: No erythema or rash.  Neurological:     Mental Status: He is alert and oriented to person, place, and time.     Cranial Nerves: No cranial nerve deficit.     Deep Tendon Reflexes: Reflexes are normal and symmetric.  Psychiatric:        Behavior: Behavior normal.        Thought Content: Thought content normal.        Judgment: Judgment normal.     BP 114/65   Pulse 67   Temp 98.3 F (36.8 C) (Temporal)   Ht 5' 8"  (1.727 m)   Wt 247 lb 3.2 oz (112.1 kg)   SpO2 96%   BMI 37.59 kg/m        Assessment & Plan:  NIGEL ERICSSON comes in today with  chief complaint of Conjunctivitis and Cerumen Impaction (LEFT EAR)   Diagnosis and orders addressed:  1. Subconjunctival hemorrhage of right eye Avoid rubbing or injury  Report if symptoms worsen or do not resovle  2. Left ear impacted cerumen Resovled   Evelina Dun, FNP

## 2020-02-26 NOTE — Patient Instructions (Signed)
Subconjunctival Hemorrhage Subconjunctival hemorrhage is bleeding that happens between the white part of your eye (sclera) and the clear membrane that covers the outside of your eye (conjunctiva). There are many tiny blood vessels near the surface of your eye. A subconjunctival hemorrhage happens when one or more of these vessels breaks and bleeds, causing a red patch to appear on your eye. This is similar to a bruise. Depending on the amount of bleeding, the red patch may only cover a small area of your eye or it may cover the entire visible part of the sclera. If a lot of blood collects under the conjunctiva, there may also be swelling. Subconjunctival hemorrhages do not affect your vision or cause pain, but your eye may feel irritated if there is swelling. Subconjunctival hemorrhages usually do not require treatment, and they usually disappear on their own within two weeks. What are the causes? This condition may be caused by:  Mild trauma, such as rubbing your eye too hard.  Blunt injuries, such as from playing sports or having contact with a deployed airbag.  Coughing, sneezing, or vomiting.  Straining, such as when lifting a heavy object.  High blood pressure.  Recent eye surgery.  Diabetes.  Certain medicines, especially blood thinners (anticoagulants).  Other conditions, such as eye tumors, bleeding disorders, or blood vessel abnormalities. Subconjunctival hemorrhages can also happen without an obvious cause. What are the signs or symptoms? Symptoms of this condition include:  A bright red or dark red patch on the white part of the eye. The red area may: ? Spread out to cover a larger area of the eye before it goes away. ? Turn brownish-yellow before it goes away.  Swelling around the eye.  Mild eye irritation. How is this diagnosed? This condition is diagnosed with a physical exam. If your subconjunctival hemorrhage was caused by trauma, your health care provider may refer  you to an eye specialist (ophthalmologist) or another specialist to check for other injuries. You may have other tests, including:  An eye exam.  A blood pressure check.  Blood tests to check for bleeding disorders. If your subconjunctival hemorrhage was caused by trauma, X-rays or a CT scan may be done to check for other injuries. How is this treated? Usually, treatment is not needed for this condition. If you have discomfort, your health care provider may recommend eye drops or cold compresses. Follow these instructions at home:  Take over-the-counter and prescription medicines only as directed by your health care provider.  Use eye drops or cold compresses to help with discomfort as directed by your health care provider.  Avoid activities, things, and environments that may irritate or injure your eye.  Keep all follow-up visits as told by your health care provider. This is important. Contact a health care provider if:  You have pain in your eye.  The bleeding does not go away within 3 weeks.  You keep getting new subconjunctival hemorrhages. Get help right away if:  Your vision changes or you have difficulty seeing.  You suddenly develop severe sensitivity to light.  You develop a severe headache, persistent vomiting, confusion, or abnormal tiredness (lethargy).  Your eye seems to bulge or protrude from your eye socket.  You develop unexplained bruises on your body.  You have unexplained bleeding in another area of your body. Summary  Subconjunctival hemorrhage is bleeding that happens between the white part of your eye and the clear membrane that covers the outside of your eye.  This condition  is similar to a bruise.  Subconjunctival hemorrhages usually do not require treatment, and they usually disappear on their own within two weeks.  Use eye drops or cold compresses to help with discomfort as directed by your health care provider. This information is not  intended to replace advice given to you by your health care provider. Make sure you discuss any questions you have with your health care provider. Document Revised: 03/18/2019 Document Reviewed: 07/02/2018 Elsevier Patient Education  [place].

## 2020-03-15 DIAGNOSIS — R69 Illness, unspecified: Secondary | ICD-10-CM | POA: Diagnosis not present

## 2020-03-19 ENCOUNTER — Other Ambulatory Visit: Payer: Self-pay | Admitting: Family Medicine

## 2020-03-19 DIAGNOSIS — S161XXA Strain of muscle, fascia and tendon at neck level, initial encounter: Secondary | ICD-10-CM

## 2020-03-19 DIAGNOSIS — H6123 Impacted cerumen, bilateral: Secondary | ICD-10-CM

## 2020-04-14 ENCOUNTER — Other Ambulatory Visit: Payer: Self-pay | Admitting: Family Medicine

## 2020-04-14 DIAGNOSIS — H6123 Impacted cerumen, bilateral: Secondary | ICD-10-CM

## 2020-04-14 DIAGNOSIS — S161XXA Strain of muscle, fascia and tendon at neck level, initial encounter: Secondary | ICD-10-CM

## 2020-05-13 DIAGNOSIS — R0789 Other chest pain: Secondary | ICD-10-CM | POA: Diagnosis not present

## 2020-05-13 DIAGNOSIS — R0602 Shortness of breath: Secondary | ICD-10-CM | POA: Diagnosis not present

## 2020-05-13 DIAGNOSIS — R000 Tachycardia, unspecified: Secondary | ICD-10-CM | POA: Diagnosis not present

## 2020-05-13 DIAGNOSIS — I4891 Unspecified atrial fibrillation: Secondary | ICD-10-CM | POA: Diagnosis not present

## 2020-05-13 DIAGNOSIS — R079 Chest pain, unspecified: Secondary | ICD-10-CM | POA: Diagnosis not present

## 2020-05-14 ENCOUNTER — Other Ambulatory Visit: Payer: Self-pay | Admitting: Family Medicine

## 2020-05-14 DIAGNOSIS — S161XXA Strain of muscle, fascia and tendon at neck level, initial encounter: Secondary | ICD-10-CM

## 2020-05-14 DIAGNOSIS — H6123 Impacted cerumen, bilateral: Secondary | ICD-10-CM

## 2020-05-16 ENCOUNTER — Ambulatory Visit (INDEPENDENT_AMBULATORY_CARE_PROVIDER_SITE_OTHER): Payer: Medicare HMO

## 2020-05-16 ENCOUNTER — Ambulatory Visit (INDEPENDENT_AMBULATORY_CARE_PROVIDER_SITE_OTHER): Payer: Medicare HMO | Admitting: Family Medicine

## 2020-05-16 ENCOUNTER — Telehealth: Payer: Self-pay | Admitting: Cardiovascular Disease

## 2020-05-16 ENCOUNTER — Telehealth: Payer: Self-pay | Admitting: Family Medicine

## 2020-05-16 ENCOUNTER — Encounter: Payer: Self-pay | Admitting: Family Medicine

## 2020-05-16 ENCOUNTER — Other Ambulatory Visit: Payer: Self-pay

## 2020-05-16 VITALS — BP 98/58 | HR 59 | Temp 97.9°F | Ht 68.0 in | Wt 247.0 lb

## 2020-05-16 DIAGNOSIS — R609 Edema, unspecified: Secondary | ICD-10-CM

## 2020-05-16 DIAGNOSIS — R0602 Shortness of breath: Secondary | ICD-10-CM | POA: Diagnosis not present

## 2020-05-16 DIAGNOSIS — M109 Gout, unspecified: Secondary | ICD-10-CM

## 2020-05-16 DIAGNOSIS — E782 Mixed hyperlipidemia: Secondary | ICD-10-CM

## 2020-05-16 DIAGNOSIS — I10 Essential (primary) hypertension: Secondary | ICD-10-CM

## 2020-05-16 DIAGNOSIS — R0600 Dyspnea, unspecified: Secondary | ICD-10-CM | POA: Diagnosis not present

## 2020-05-16 MED ORDER — LOSARTAN POTASSIUM 25 MG PO TABS: 25.0000 mg | ORAL_TABLET | Freq: Every day | ORAL | 3 refills | Status: DC

## 2020-05-16 NOTE — Progress Notes (Signed)
 BP (!) 98/58   Pulse 59   Temp 97.9 F (36.6 C)   Ht 5' 8" (1.727 m)   Wt (!) 247 lb (112 kg)   SpO2 94%   BMI 37.56 kg/m    Subjective:   Patient ID: Luis Deleon, male    DOB: 07/30/1941, 79 y.o.   MRN: [id number]  HPI: Luis Deleon is a 78 y.o. male presenting on 05/16/2020 for Medical Management of Chronic Issues, Hyperlipidemia, and Hypertension   HPI Patient is coming in today complaining of increased swelling in his lower legs over the past 3 months.  He also complains of an episode about 5 days ago of dizziness and lightheadedness and shortness of breath and tightness in his chest and pain on the left side of his neck that lasted less than 20 minutes, he called EMS and they were going to take him in but then he did not want a wait long so he did not end up going in.  He still has a little bit of shortness of breath taking deep breaths since that time.  He denies any further chest pain or pressure or tightness.  They said something about his heart rate running up high, he had a similar episode to this a couple years ago with his cardiologist and they did a work-up.  He also complains of feeling lightheaded and dizzy especially when he stands up.  Hypertension Patient is currently on hydrochlorothiazide and losartan and metoprolol, and their blood pressure today is 98/58. Patient denies headaches, blurred vision, chest pains, shortness of breath, or weakness. Denies any side effects from medication and is content with current medication.   Gout Last attack: Unknown, cannot recall Attacks this year: None Medication: Allopurinol Location of attacks: Feet  Hyperlipidemia Patient is coming in for recheck of his hyperlipidemia. The patient is currently taking omega-3 and gemfibrozil and atorvastatin. They deny any issues with myalgias or history of liver damage from it. They deny any focal numbness or weakness or chest pain.   Relevant past medical, surgical, family and social  history reviewed and updated as indicated. Interim medical history since our last visit reviewed. Allergies and medications reviewed and updated.  Review of Systems  Constitutional: Negative for chills and fever.  Respiratory: Negative for shortness of breath and wheezing.   Cardiovascular: Negative for chest pain and leg swelling.  Musculoskeletal: Negative for back pain and gait problem.  Skin: Negative for rash.  Neurological: Positive for dizziness and light-headedness. Negative for seizures and weakness.  All other systems reviewed and are negative.   Per HPI unless specifically indicated above   Allergies as of 05/16/2020      Reactions   Colchicine Diarrhea, Nausea And Vomiting, Other (See Comments)   REACTION: nausea, diarrhea and interactions with 6mp      Medication List       Accurate as of May 16, 2020 11:14 AM. If you have any questions, ask your nurse or doctor.        allopurinol 100 MG tablet Commonly known as: ZYLOPRIM Take 1 tablet (100 mg total) by mouth 2 (two) times daily.   aspirin 81 MG EC tablet Take 81 mg by mouth daily.   atorvastatin 40 MG tablet Commonly known as: LIPITOR Take 1 tablet (40 mg total) by mouth daily at 6 PM.   baclofen 10 MG tablet Commonly known as: LIORESAL TAKE 1 TABLET BY MOUTH AT BEDTIME AS NEEDED FOR MUSCLE SPASMS   gemfibrozil 600   MG tablet Commonly known as: LOPID Take 1 tablet (600 mg total) by mouth daily.   hydrochlorothiazide 12.5 MG capsule Commonly known as: MICROZIDE Take 1 capsule (12.5 mg total) by mouth daily. As directed   HYDROmorphone 2 MG tablet Commonly known as: Dilaudid Take 1 tablet (2 mg total) by mouth every 4 (four) hours as needed for severe pain.   Klor-Con M20 20 MEQ tablet Generic drug: potassium chloride SA Take 2 tablets (40 mEq total) by mouth daily.   losartan 25 MG tablet Commonly known as: COZAAR Take 1 tablet (25 mg total) by mouth daily. What changed:   medication  strength  how much to take Changed by: Fransisca Kaufmann Rochell Puett, MD   metoprolol tartrate 50 MG tablet Commonly known as: LOPRESSOR Take 1 tablet (50 mg total) by mouth 2 (two) times daily.   nystatin-triamcinolone cream Commonly known as: MYCOLOG II Apply 1 application topically 2 (two) times daily.   Omega-3 1000 MG Caps Take 2 capsules by mouth 2 (two) times daily.   Vitamin D3 25 MCG (1000 UT) Caps Take 4 capsules by mouth every morning.        Objective:   BP (!) 98/58   Pulse 59   Temp 97.9 F (36.6 C)   Ht 5' 8" (1.727 m)   Wt (!) 247 lb (112 kg)   SpO2 94%   BMI 37.56 kg/m   Wt Readings from Last 3 Encounters:  05/16/20 (!) 247 lb (112 kg)  02/26/20 247 lb 3.2 oz (112.1 kg)  02/22/20 248 lb (112.5 kg)    Physical Exam Vitals and nursing note reviewed.  Constitutional:      General: He is not in acute distress.    Appearance: He is well-developed. He is not diaphoretic.  Eyes:     General: No scleral icterus.    Conjunctiva/sclera: Conjunctivae normal.  Neck:     Thyroid: No thyromegaly.  Cardiovascular:     Rate and Rhythm: Normal rate and regular rhythm.     Heart sounds: Normal heart sounds. No murmur heard.   Pulmonary:     Effort: Pulmonary effort is normal. No respiratory distress.     Breath sounds: Normal breath sounds. No wheezing.  Musculoskeletal:        General: Swelling (Bilateral 2+ lower extremity edema from mid shin down) present. Normal range of motion.     Cervical back: Neck supple.  Lymphadenopathy:     Cervical: No cervical adenopathy.  Skin:    General: Skin is warm and dry.     Findings: No rash.  Neurological:     Mental Status: He is alert and oriented to person, place, and time.     Coordination: Coordination normal.  Psychiatric:        Behavior: Behavior normal.     Chest x-ray: No active cardiopulmonary disease  Assessment & Plan:   Problem List Items Addressed This Visit      Cardiovascular and Mediastinum    HTN (hypertension)   Relevant Medications   losartan (COZAAR) 25 MG tablet   Other Relevant Orders   CBC with Differential/Platelet (Completed)   CMP14+EGFR (Completed)     Other   Hyperlipidemia - Primary   Relevant Medications   losartan (COZAAR) 25 MG tablet   Other Relevant Orders   Lipid panel (Completed)   GOUT, UNSPECIFIED   Relevant Orders   CBC with Differential/Platelet (Completed)   Uric acid (Completed)    Other Visit Diagnoses    Exertional  shortness of breath       Relevant Orders   Brain natriuretic peptide (Completed)   DG Chest 2 View (Completed)   Peripheral edema       Relevant Orders   Brain natriuretic peptide (Completed)   DG Chest 2 View (Completed)      Patient is lightheaded and dizzy and blood pressure is down to 98/58 today so we will back off on the losartan to 25 mg.  With fluid and swelling in his legs and the episode he had last week of the chest pressure, recommended that he call his cardiologist and get an appointment ASAP, let us know if he has any trouble and we can help him.  Will do chest x-ray and BNP today and blood work. Follow up plan: Return in about 3 months (around 08/16/2020), or if symptoms worsen or fail to improve, for Hypertension and swelling recheck.  Counseling provided for all of the vaccine components Orders Placed This Encounter  Procedures  . DG Chest 2 View  . CBC with Differential/Platelet  . CMP14+EGFR  . Lipid panel  . Uric acid  . Brain natriuretic peptide     , MD [facility] 05/16/2020, 11:14 AM     

## 2020-05-16 NOTE — Telephone Encounter (Signed)
Patient aware and verbalizes understanding. 

## 2020-05-16 NOTE — Telephone Encounter (Signed)
Pt c/o Shortness Of Breath: STAT if SOB developed within the last 24 hours or pt is noticeably SOB on the phone  1. Are you currently SOB (can you hear that pt is SOB on the phone)? No   2. How long have you been experiencing SOB? Since 05/12/20  3. Are you SOB when sitting or when up moving around? Both   4. Are you currently experiencing any other symptoms? Chest tightness, sore throat   Pt c/o swelling: STAT is pt has developed SOB within 24 hours  1) How much weight have you gained and in what time span? 3-4 in a few months  2) If swelling, where is the swelling located? Both ankles  3) Are you currently taking a fluid pill? Yes   4) Are you currently SOB? No  5) Do you have a log of your daily weights (if so, list)? No  6) Have you gained 3 pounds in a day or 5 pounds in a week? No  7) Have you traveled recently? No

## 2020-05-16 NOTE — Telephone Encounter (Signed)
Yes most commonly tinnitus, the most common cause of tinnitus is hearing loss so if it gets to be big enough then he can get his hearing checked and see if he needs hearing aids.  The other things that can be done for it are to have white noise machines or static machines and that helps cancel out the noise.

## 2020-05-16 NOTE — Telephone Encounter (Signed)
Called patient, he states that he has been having some tightness (not pain) in his chest area, he mentions being SOB and having some swelling in his legs. He did call EMS the other night and they ruled out a heart attack, but suggested he be seen by his primary care doctor. He did see them today- and they suggested that he be seen by [place] ASAP.  I was able to place patient as a double book spot on Friday at 9:45 AM.  Will notify Dr.Berry of this. Thank you!

## 2020-05-17 LAB — CBC WITH DIFFERENTIAL/PLATELET
Basophils Absolute: 0.1 10*3/uL (ref 0.0–0.2)
Basos: 1 %
EOS (ABSOLUTE): 0.2 10*3/uL (ref 0.0–0.4)
Eos: 2 %
Hematocrit: 55.3 % — ABNORMAL HIGH (ref 37.5–51.0)
Hemoglobin: 18.2 g/dL — ABNORMAL HIGH (ref 13.0–17.7)
Immature Grans (Abs): 0 10*3/uL (ref 0.0–0.1)
Immature Granulocytes: 1 %
Lymphocytes Absolute: 1.8 10*3/uL (ref 0.7–3.1)
Lymphs: 21 %
MCH: 31.4 pg (ref 26.6–33.0)
MCHC: 32.9 g/dL (ref 31.5–35.7)
MCV: 95 fL (ref 79–97)
Monocytes Absolute: 0.8 10*3/uL (ref 0.1–0.9)
Monocytes: 10 %
Neutrophils Absolute: 5.7 10*3/uL (ref 1.4–7.0)
Neutrophils: 65 %
Platelets: 145 10*3/uL — ABNORMAL LOW (ref 150–450)
RBC: 5.8 x10E6/uL (ref 4.14–5.80)
RDW: 14 % (ref 11.6–15.4)
WBC: 8.6 10*3/uL (ref 3.4–10.8)

## 2020-05-17 LAB — CMP14+EGFR
ALT: 28 IU/L (ref 0–44)
AST: 34 IU/L (ref 0–40)
Albumin/Globulin Ratio: 1.5 (ref 1.2–2.2)
Albumin: 3.9 g/dL (ref 3.7–4.7)
Alkaline Phosphatase: 66 IU/L (ref 48–121)
BUN/Creatinine Ratio: 10 (ref 10–24)
BUN: 12 mg/dL (ref 8–27)
Bilirubin Total: 2 mg/dL — ABNORMAL HIGH (ref 0.0–1.2)
CO2: 25 mmol/L (ref 20–29)
Calcium: 9.6 mg/dL (ref 8.6–10.2)
Chloride: 104 mmol/L (ref 96–106)
Creatinine, Ser: 1.24 mg/dL (ref 0.76–1.27)
GFR calc Af Amer: 65 mL/min/{1.73_m2} (ref >59)
GFR calc non Af Amer: 56 mL/min/{1.73_m2} — ABNORMAL LOW (ref >59)
Globulin, Total: 2.6 g/dL (ref 1.5–4.5)
Glucose: 101 mg/dL — ABNORMAL HIGH (ref 65–99)
Potassium: 4.4 mmol/L (ref 3.5–5.2)
Sodium: 142 mmol/L (ref 134–144)
Total Protein: 6.5 g/dL (ref 6.0–8.5)

## 2020-05-17 LAB — LIPID PANEL
Chol/HDL Ratio: 3.6 ratio (ref 0.0–5.0)
Cholesterol, Total: 128 mg/dL (ref 100–199)
HDL: 36 mg/dL — ABNORMAL LOW (ref >39)
LDL Chol Calc (NIH): 67 mg/dL (ref 0–99)
Triglycerides: 145 mg/dL (ref 0–149)
VLDL Cholesterol Cal: 25 mg/dL (ref 5–40)

## 2020-05-17 LAB — BRAIN NATRIURETIC PEPTIDE: BNP: 43 pg/mL (ref 0.0–100.0)

## 2020-05-17 LAB — URIC ACID: Uric Acid: 4.8 mg/dL (ref 3.8–8.4)

## 2020-05-20 ENCOUNTER — Other Ambulatory Visit: Payer: Self-pay

## 2020-05-20 ENCOUNTER — Encounter: Payer: Self-pay | Admitting: Cardiovascular Disease

## 2020-05-20 ENCOUNTER — Ambulatory Visit: Payer: Medicare HMO | Admitting: Cardiovascular Disease

## 2020-05-20 DIAGNOSIS — I10 Essential (primary) hypertension: Secondary | ICD-10-CM

## 2020-05-20 DIAGNOSIS — I471 Supraventricular tachycardia: Secondary | ICD-10-CM | POA: Diagnosis not present

## 2020-05-20 DIAGNOSIS — E782 Mixed hyperlipidemia: Secondary | ICD-10-CM

## 2020-05-20 NOTE — Assessment & Plan Note (Signed)
History of essential hypertension blood pressure measured today 126/72.  He is on losartan and metoprolol.

## 2020-05-20 NOTE — Progress Notes (Signed)
05/20/2020 Luis Deleon   1941-09-30  [id number]  Primary Physician Dettinger, Fransisca Kaufmann, MD Primary Cardiologist: Lorretta Harp MD Lupe Carney, [place]  HPI:  Luis Deleon is a 79 y.o.  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 He was referred by Dr. Morrie Sheldon, his primary care physician, for cardiovascular evaluation because of a recent episode of PSVT.  I last spoke to him on the phone for a virtual telemedicine phone visit on 02/26/2019.Marland Kitchen  He has a history of treated hypertension and hyperlipidemia. He's never had a heart attack or stroke. He smoked remotely having stopped 25 years ago. He does have a history of Crohn's disease and prostate cancer status post radical prostatectomy remotely by Dr. Jeffie Pollock at which time he did have an episode of PSVT.he was admitted to [facility] on 12/19/17 with what appears to be PSVT which broke. His rate was 160. He had positive below troponins. A 2-D echo was normal.  Since I saw him a year ago he is remained stable.  He denies chest pain or shortness of breath.  He does get and work in his yard and walks without limitation or symptoms.    He has had what sounds like a recent episode of PSVT in the last several weeks and called EMS but did not get transported.   Current Meds  Medication Sig  . allopurinol (ZYLOPRIM) 100 MG tablet Take 1 tablet (100 mg total) by mouth 2 (two) times daily.  Marland Kitchen aspirin 81 MG EC tablet Take 81 mg by mouth daily.    Marland Kitchen atorvastatin (LIPITOR) 40 MG tablet Take 1 tablet (40 mg total) by mouth daily at 6 PM.  . baclofen (LIORESAL) 10 MG tablet TAKE 1 TABLET BY MOUTH AT BEDTIME AS NEEDED FOR MUSCLE SPASMS  . Cholecalciferol (VITAMIN D3) 1000 UNITS CAPS Take 4 capsules by mouth every morning.   Marland Kitchen gemfibrozil (LOPID) 600 MG tablet Take 1 tablet (600 mg total) by mouth daily.  .  hydrochlorothiazide (MICROZIDE) 12.5 MG capsule Take 1 capsule (12.5 mg total) by mouth daily. As directed  . HYDROmorphone (DILAUDID) 2 MG tablet Take 1 tablet (2 mg total) by mouth every 4 (four) hours as needed for severe pain.  Marland Kitchen KLOR-CON M20 20 MEQ tablet Take 2 tablets (40 mEq total) by mouth daily.  Marland Kitchen losartan (COZAAR) 25 MG tablet Take 1 tablet (25 mg total) by mouth daily.  . metoprolol tartrate (LOPRESSOR) 50 MG tablet Take 1 tablet (50 mg total) by mouth 2 (two) times daily.  Marland Kitchen nystatin-triamcinolone (MYCOLOG II) cream Apply 1 application topically 2 (two) times daily.  . OMEGA-3 1000 MG CAPS Take 2 capsules by mouth 2 (two) times daily.     Allergies  Allergen Reactions  . Colchicine Diarrhea, Nausea And Vomiting and Other (See Comments)    REACTION: nausea, diarrhea and interactions with 30m    Social History   Socioeconomic History  . Marital status: Married    Spouse name: [name] . Number of children: 1  . Years of education: 138 . Highest education level: 12th grade  Occupational History  . Occupation: Retired     Comment: LEvent organiser Tobacco Use  . Smoking status: Former Smoker    Packs/day: 1.00    Years: 38.00    Pack years: 38.00    Types: Cigarettes  Quit date: 03/19/1991    Years since quitting: 29.1  . Smokeless tobacco: Current User    Types: Chew  Vaping Use  . Vaping Use: Never used  Substance and Sexual Activity  . Alcohol use: No  . Drug use: No  . Sexual activity: Never  Other Topics Concern  . Not on file  Social History Narrative  . Not on file   Social Determinants of Health   Financial Resource Strain:   . Difficulty of Paying Living Expenses:   Food Insecurity:   . Worried About Charity fundraiser in the Last Year:   . Arboriculturist in the Last Year:   Transportation Needs:   . Film/video editor (Medical):   Marland Kitchen Lack of Transportation (Non-Medical):   Physical Activity:   . Days of Exercise per Week:   . Minutes  of Exercise per Session:   Stress:   . Feeling of Stress :   Social Connections:   . Frequency of Communication with Friends and Family:   . Frequency of Social Gatherings with Friends and Family:   . Attends Religious Services:   . Active Member of Clubs or Organizations:   . Attends Archivist Meetings:   Marland Kitchen Marital Status:   Intimate Partner Violence:   . Fear of Current or Ex-Partner:   . Emotionally Abused:   Marland Kitchen Physically Abused:   . Sexually Abused:      Review of Systems: General: negative for chills, fever, night sweats or weight changes.  Cardiovascular: negative for chest pain, dyspnea on exertion, edema, orthopnea, palpitations, paroxysmal nocturnal dyspnea or shortness of breath Dermatological: negative for rash Respiratory: negative for cough or wheezing Urologic: negative for hematuria Abdominal: negative for nausea, vomiting, diarrhea, bright red blood per rectum, melena, or hematemesis Neurologic: negative for visual changes, syncope, or dizziness All other systems reviewed and are otherwise negative except as noted above.    Blood pressure 126/72, pulse 61, height 5' 8"  (1.727 m), weight 249 lb 9.6 oz (113.2 kg), SpO2 95 %.  General appearance: alert and no distress Neck: no adenopathy, no carotid bruit, no JVD, supple, symmetrical, trachea midline and thyroid not enlarged, symmetric, no tenderness/mass/nodules Lungs: clear to auscultation bilaterally Heart: regular rate and rhythm, S1, S2 normal, no murmur, click, rub or gallop Extremities: extremities normal, atraumatic, no cyanosis or edema Pulses: 2+ and symmetric Skin: Skin color, texture, turgor normal. No rashes or lesions Neurologic: Alert and oriented X 3, normal strength and tone. Normal symmetric reflexes. Normal coordination and gait  EKG sinus rhythm at 61 without ST or T wave changes.  I personally reviewed this EKG.  ASSESSMENT AND PLAN:   Hyperlipidemia History of hyperlipidemia on  statin therapy with lipid profile performed 05/16/2020 revealing total cholesterol 128, LDL 45 and HDL 36.  HTN (hypertension) History of essential hypertension blood pressure measured today 126/72.  He is on losartan and metoprolol.  PSVT (paroxysmal supraventricular tachycardia) ([place]) He has had several episodes of PSVT in the past, 1 of which occurred when he had his radical prostatectomy by Dr. Jeffie Pollock and another at [facility] 12/19/2017.  Does have a normal 2D echo.  He appears to have had an episode recently as well but these are very infrequent.      Lorretta Harp MD FACP,FACC,FAHA, [facility] 05/20/2020 10:43 AM

## 2020-05-20 NOTE — Patient Instructions (Signed)
Medication Instructions:  Your physician recommends that you continue on your current medications as directed. Please refer to the Current Medication list given to you today.  *If you need a refill on your cardiac medications before your next appointment, please call your pharmacy*   Follow-Up: At [facility], you and your health needs are our priority.  As part of our continuing mission to provide you with exceptional heart care, we have created designated Provider Care Teams.  These Care Teams include your primary Cardiologist (physician) and Advanced Practice Providers (APPs -  Physician Assistants and Nurse Practitioners) who all work together to provide you with the care you need, when you need it.  We recommend signing up for the patient portal called "MyChart".  Sign up information is provided on this After Visit Summary.  MyChart is used to connect with patients for Virtual Visits (Telemedicine).  Patients are able to view lab/test results, encounter notes, upcoming appointments, etc.  Non-urgent messages can be sent to your provider as well.   To learn more about what you can do with MyChart, go to [email or web address].    Your next appointment:   6 month(s) with one of the following Advanced Practice Providers on your designated Care Team:    Kerin Ransom, PA-C  [place], [place]  Coletta Memos, [place]  12 months(s) with Dr. Gwenlyn Found   The format for your next appointment:   In Person

## 2020-05-20 NOTE — Assessment & Plan Note (Signed)
History of hyperlipidemia on statin therapy with lipid profile performed 05/16/2020 revealing total cholesterol 128, LDL 45 and HDL 36.

## 2020-05-20 NOTE — Assessment & Plan Note (Signed)
He has had several episodes of PSVT in the past, 1 of which occurred when he had his radical prostatectomy by Dr. Jeffie Pollock and another at [facility] 12/19/2017.  Does have a normal 2D echo.  He appears to have had an episode recently as well but these are very infrequent.

## 2020-05-24 ENCOUNTER — Telehealth: Payer: Self-pay | Admitting: Family Medicine

## 2020-05-24 NOTE — Telephone Encounter (Signed)
8/3 BP-119/70  HR-69  8/4  BP-110/70  HR-66  8/5  BP-128/72  HR-66  2:45  8/6  BP-126/72  HR-61  10am  8/7  BP-131/76 HR-65  10pm  8/8  BP-143/82  HR-58  6:15  8/9  BP-126/75  7:15  Patient would like to know if he needs to continue on the 50m of Lisinopril or go back to the 515m He would like to avoid cutting the tablet in half. Please send in new script if needed. Pt aware that Dettinger will not address until Thursday.

## 2020-05-25 DIAGNOSIS — N4889 Other specified disorders of penis: Secondary | ICD-10-CM | POA: Diagnosis not present

## 2020-05-25 DIAGNOSIS — N281 Cyst of kidney, acquired: Secondary | ICD-10-CM | POA: Diagnosis not present

## 2020-05-25 DIAGNOSIS — N476 Balanoposthitis: Secondary | ICD-10-CM | POA: Diagnosis not present

## 2020-05-26 MED ORDER — LOSARTAN POTASSIUM 25 MG PO TABS: 25.0000 mg | ORAL_TABLET | Freq: Every day | ORAL | 3 refills | Status: DC

## 2020-05-26 NOTE — Telephone Encounter (Signed)
Patient aware.

## 2020-05-26 NOTE — Telephone Encounter (Signed)
Please let the patient know that those blood pressures look good, continue on the losartan 25 mg, I sent in a new prescription for him.

## 2020-06-23 ENCOUNTER — Other Ambulatory Visit: Payer: Self-pay | Admitting: Family Medicine

## 2020-06-23 DIAGNOSIS — H6123 Impacted cerumen, bilateral: Secondary | ICD-10-CM

## 2020-06-23 DIAGNOSIS — S161XXA Strain of muscle, fascia and tendon at neck level, initial encounter: Secondary | ICD-10-CM

## 2020-07-12 ENCOUNTER — Encounter: Payer: Self-pay | Admitting: Nurse Practitioner

## 2020-07-12 ENCOUNTER — Ambulatory Visit (INDEPENDENT_AMBULATORY_CARE_PROVIDER_SITE_OTHER): Payer: Medicare HMO | Admitting: Nurse Practitioner

## 2020-07-12 ENCOUNTER — Telehealth: Payer: Self-pay | Admitting: Family Medicine

## 2020-07-12 DIAGNOSIS — J069 Acute upper respiratory infection, unspecified: Secondary | ICD-10-CM | POA: Diagnosis not present

## 2020-07-12 MED ORDER — FLUTICASONE PROPIONATE 50 MCG/ACT NA SUSP: 2.0000 | Freq: Every day | NASAL | 6 refills | Status: DC

## 2020-07-12 NOTE — Progress Notes (Signed)
Virtual Visit via telephone Note Due to COVID-19 pandemic this visit was conducted virtually. This visit type was conducted due to national recommendations for restrictions regarding the COVID-19 Pandemic (e.g. social distancing, sheltering in place) in an effort to limit this patient's exposure and mitigate transmission in our community. All issues noted in this document were discussed and addressed.  A physical exam was not performed with this format.  I connected with Luis Deleon on 07/12/20 at 3:05 by telephone and verified that I am speaking with the correct person using two identifiers. Luis Deleon is currently located at home and his wife is currently with him during visit. The provider, Mary-Margaret Hassell Done, FNP is located in their office at time of visit.  I discussed the limitations, risks, security and privacy concerns of performing an evaluation and management service by telephone and the availability of in person appointments. I also discussed with the patient that there may be a patient responsible charge related to this service. The patient expressed understanding and agreed to proceed.   History and Present Illness:   Chief Complaint: Nasal Congestion   HPI Patient says that yesterday afternoon he developed a sore throat and runny nose. He gargled with salt water and sore throat is better. He is coughing an d sneezing. Cough is a little better today. He denies covid exposure and he has had both vaccines.   He has been taking mucinex at home and that has helped  Review of Systems  Constitutional: Negative for chills, fever and malaise/fatigue.  HENT: Positive for congestion and sore throat.   Respiratory: Positive for cough (occasional).   Musculoskeletal: Negative for myalgias.  Neurological: Negative for headaches.  All other systems reviewed and are negative.    Observations/Objective: Alert and oriented- answers all questions appropriately No  distress Voice hoarse   Assessment and Plan: Luis Deleon in today with chief complaint of Nasal Congestion   1. Upper respiratory infection with cough and congestion 1. Take meds as prescribed 2. Use a cool mist humidifier especially during the winter months and when heat has been humid. 3. Use saline nose sprays frequently 4. Saline irrigations of the nose can be very helpful if done frequently.  * 4X daily for 1 week*  * Use of a nettie pot can be helpful with this. Follow directions with this* 5. Drink plenty of fluids 6. Keep thermostat turn down low 7.For any cough or congestion  Use plain Mucinex- regular strength or max strength is fine   * Children- consult with Pharmacist for dosing 8. For fever or aces or pains- take tylenol or ibuprofen appropriate for age and weight.  * for fevers greater than 101 orally you may alternate ibuprofen and tylenol every  3 hours.   Meds ordered this encounter  Medications  . fluticasone (FLONASE) 50 MCG/ACT nasal spray    Sig: Place 2 sprays into both nostrils daily.    Dispense:  16 g    Refill:  6    Order Specific Question:   Supervising Provider    Answer:   Caryl Pina A [[id number]]         Follow Up Instructions: prn    I discussed the assessment and treatment plan with the patient. The patient was provided an opportunity to ask questions and all were answered. The patient agreed with the plan and demonstrated an understanding of the instructions.   The patient was advised to call back or seek an in-person evaluation if  the symptoms worsen or if the condition fails to improve as anticipated.  The above assessment and management plan was discussed with the patient. The patient verbalized understanding of and has agreed to the management plan. Patient is aware to call the clinic if symptoms persist or worsen. Patient is aware when to return to the clinic for a follow-up visit. Patient educated on when it is appropriate  to go to the emergency department.   Time call ended:  3:23  I provided 18 minutes of non-face-to-face time during this encounter.    Mary-Margaret Hassell Done, FNP

## 2020-07-12 NOTE — Telephone Encounter (Signed)
Appt made

## 2020-07-18 ENCOUNTER — Other Ambulatory Visit: Payer: Self-pay | Admitting: Family Medicine

## 2020-07-28 ENCOUNTER — Other Ambulatory Visit: Payer: Self-pay | Admitting: Family Medicine

## 2020-07-28 DIAGNOSIS — H6123 Impacted cerumen, bilateral: Secondary | ICD-10-CM

## 2020-07-28 DIAGNOSIS — S161XXA Strain of muscle, fascia and tendon at neck level, initial encounter: Secondary | ICD-10-CM

## 2020-08-11 DIAGNOSIS — R69 Illness, unspecified: Secondary | ICD-10-CM | POA: Diagnosis not present

## 2020-08-15 ENCOUNTER — Ambulatory Visit: Payer: Medicare HMO | Admitting: Family Medicine

## 2020-08-23 ENCOUNTER — Other Ambulatory Visit: Payer: Self-pay | Admitting: Family Medicine

## 2020-08-23 DIAGNOSIS — S161XXA Strain of muscle, fascia and tendon at neck level, initial encounter: Secondary | ICD-10-CM

## 2020-08-23 DIAGNOSIS — H6123 Impacted cerumen, bilateral: Secondary | ICD-10-CM

## 2020-08-25 ENCOUNTER — Other Ambulatory Visit: Payer: Self-pay | Admitting: Family Medicine

## 2020-08-26 DIAGNOSIS — H1045 Other chronic allergic conjunctivitis: Secondary | ICD-10-CM | POA: Diagnosis not present

## 2020-09-02 ENCOUNTER — Ambulatory Visit: Payer: Medicare HMO | Admitting: Family Medicine

## 2020-09-06 ENCOUNTER — Other Ambulatory Visit: Payer: Self-pay | Admitting: Family Medicine

## 2020-09-07 ENCOUNTER — Other Ambulatory Visit: Payer: Self-pay | Admitting: Family Medicine

## 2020-09-16 ENCOUNTER — Ambulatory Visit: Payer: Medicare HMO | Admitting: Family Medicine

## 2020-09-17 ENCOUNTER — Other Ambulatory Visit: Payer: Self-pay | Admitting: Family Medicine

## 2020-09-17 DIAGNOSIS — H6123 Impacted cerumen, bilateral: Secondary | ICD-10-CM

## 2020-09-17 DIAGNOSIS — S161XXA Strain of muscle, fascia and tendon at neck level, initial encounter: Secondary | ICD-10-CM

## 2020-09-27 ENCOUNTER — Other Ambulatory Visit: Payer: Self-pay | Admitting: Family Medicine

## 2020-10-13 DIAGNOSIS — H43393 Other vitreous opacities, bilateral: Secondary | ICD-10-CM | POA: Diagnosis not present

## 2020-10-13 DIAGNOSIS — H2513 Age-related nuclear cataract, bilateral: Secondary | ICD-10-CM | POA: Diagnosis not present

## 2020-10-13 DIAGNOSIS — H524 Presbyopia: Secondary | ICD-10-CM | POA: Diagnosis not present

## 2020-10-13 DIAGNOSIS — H5203 Hypermetropia, bilateral: Secondary | ICD-10-CM | POA: Diagnosis not present

## 2020-10-13 DIAGNOSIS — H52223 Regular astigmatism, bilateral: Secondary | ICD-10-CM | POA: Diagnosis not present

## 2020-10-15 ENCOUNTER — Other Ambulatory Visit: Payer: Self-pay | Admitting: Family Medicine

## 2020-10-15 DIAGNOSIS — S161XXA Strain of muscle, fascia and tendon at neck level, initial encounter: Secondary | ICD-10-CM

## 2020-10-15 DIAGNOSIS — H6123 Impacted cerumen, bilateral: Secondary | ICD-10-CM

## 2020-10-19 ENCOUNTER — Other Ambulatory Visit: Payer: Self-pay

## 2020-10-19 ENCOUNTER — Encounter: Payer: Self-pay | Admitting: Family Medicine

## 2020-10-19 ENCOUNTER — Ambulatory Visit (INDEPENDENT_AMBULATORY_CARE_PROVIDER_SITE_OTHER): Payer: Medicare HMO | Admitting: Family Medicine

## 2020-10-19 VITALS — BP 141/82 | HR 61 | Ht 68.0 in | Wt 247.0 lb

## 2020-10-19 DIAGNOSIS — E782 Mixed hyperlipidemia: Secondary | ICD-10-CM

## 2020-10-19 DIAGNOSIS — R609 Edema, unspecified: Secondary | ICD-10-CM

## 2020-10-19 DIAGNOSIS — R600 Localized edema: Secondary | ICD-10-CM

## 2020-10-19 DIAGNOSIS — I10 Essential (primary) hypertension: Secondary | ICD-10-CM | POA: Diagnosis not present

## 2020-10-19 LAB — CBC WITH DIFFERENTIAL/PLATELET
Basophils Absolute: 0 10*3/uL (ref 0.0–0.2)
Basos: 1 %
EOS (ABSOLUTE): 0.2 10*3/uL (ref 0.0–0.4)
Eos: 3 %
Hematocrit: 54.2 % — ABNORMAL HIGH (ref 37.5–51.0)
Hemoglobin: 18.1 g/dL — ABNORMAL HIGH (ref 13.0–17.7)
Immature Grans (Abs): 0 10*3/uL (ref 0.0–0.1)
Immature Granulocytes: 0 %
Lymphocytes Absolute: 1.9 10*3/uL (ref 0.7–3.1)
Lymphs: 22 %
MCH: 32.4 pg (ref 26.6–33.0)
MCHC: 33.4 g/dL (ref 31.5–35.7)
MCV: 97 fL (ref 79–97)
Monocytes Absolute: 0.9 10*3/uL (ref 0.1–0.9)
Monocytes: 10 %
Neutrophils Absolute: 5.7 10*3/uL (ref 1.4–7.0)
Neutrophils: 64 %
Platelets: 155 10*3/uL (ref 150–450)
RBC: 5.59 x10E6/uL (ref 4.14–5.80)
RDW: 14.1 % (ref 11.6–15.4)
WBC: 8.8 10*3/uL (ref 3.4–10.8)

## 2020-10-19 LAB — CMP14+EGFR
ALT: 32 IU/L (ref 0–44)
AST: 32 IU/L (ref 0–40)
Albumin/Globulin Ratio: 1.4 (ref 1.2–2.2)
Albumin: 3.6 g/dL — ABNORMAL LOW (ref 3.7–4.7)
Alkaline Phosphatase: 62 IU/L (ref 44–121)
BUN/Creatinine Ratio: 12 (ref 10–24)
BUN: 17 mg/dL (ref 8–27)
Bilirubin Total: 1.4 mg/dL — ABNORMAL HIGH (ref 0.0–1.2)
CO2: 24 mmol/L (ref 20–29)
Calcium: 9.3 mg/dL (ref 8.6–10.2)
Chloride: 105 mmol/L (ref 96–106)
Creatinine, Ser: 1.42 mg/dL — ABNORMAL HIGH (ref 0.76–1.27)
GFR calc Af Amer: 55 mL/min/{1.73_m2} — ABNORMAL LOW (ref >59)
GFR calc non Af Amer: 47 mL/min/{1.73_m2} — ABNORMAL LOW (ref >59)
Globulin, Total: 2.6 g/dL (ref 1.5–4.5)
Glucose: 92 mg/dL (ref 65–99)
Potassium: 3.9 mmol/L (ref 3.5–5.2)
Sodium: 143 mmol/L (ref 134–144)
Total Protein: 6.2 g/dL (ref 6.0–8.5)

## 2020-10-19 LAB — LIPID PANEL
Chol/HDL Ratio: 3.4 ratio (ref 0.0–5.0)
Cholesterol, Total: 125 mg/dL (ref 100–199)
HDL: 37 mg/dL — ABNORMAL LOW (ref >39)
LDL Chol Calc (NIH): 66 mg/dL (ref 0–99)
Triglycerides: 124 mg/dL (ref 0–149)
VLDL Cholesterol Cal: 22 mg/dL (ref 5–40)

## 2020-10-19 MED ORDER — GEMFIBROZIL 600 MG PO TABS: 600.0000 mg | ORAL_TABLET | Freq: Every day | ORAL | 3 refills | Status: DC

## 2020-10-19 MED ORDER — ATORVASTATIN CALCIUM 40 MG PO TABS: 40.0000 mg | ORAL_TABLET | Freq: Every day | ORAL | 3 refills | Status: DC

## 2020-10-19 MED ORDER — HYDROCHLOROTHIAZIDE 12.5 MG PO CAPS: 12.5000 mg | ORAL_CAPSULE | Freq: Every day | ORAL | 3 refills | Status: DC

## 2020-10-19 MED ORDER — METOPROLOL TARTRATE 50 MG PO TABS: 50.0000 mg | ORAL_TABLET | Freq: Two times a day (BID) | ORAL | 3 refills | Status: DC

## 2020-10-19 MED ORDER — ALLOPURINOL 100 MG PO TABS: 100.0000 mg | ORAL_TABLET | Freq: Two times a day (BID) | ORAL | 3 refills | Status: DC

## 2020-10-19 MED ORDER — LOSARTAN POTASSIUM 25 MG PO TABS: 25.0000 mg | ORAL_TABLET | Freq: Every day | ORAL | 3 refills | Status: DC

## 2020-10-19 NOTE — Progress Notes (Signed)
BP (!) 141/82   Pulse 61   Ht 5' 8"  (1.727 m)   Wt 247 lb (112 kg)   SpO2 98%   BMI 37.56 kg/m    Subjective:   Patient ID: Luis Deleon, male    DOB: 16-Apr-1941, 80 y.o.   MRN: [id number]  HPI: Luis Deleon is a 80 y.o. male presenting on 10/19/2020 for Medical Management of Chronic Issues, Hyperlipidemia, and Leg Swelling (Bilateral ankle swelling)   HPI Hyperlipidemia Patient is coming in for recheck of his hyperlipidemia. The patient is currently taking Lipitor and gemfibrozil and fish oils. They deny any issues with myalgias or history of liver damage from it. They deny any focal numbness or weakness or chest pain.   Hypertension Patient is currently on hydrochlorothiazide and metoprolol and losartan, and their blood pressure today is 141/82. Patient denies any lightheadedness or dizziness. Patient denies headaches, blurred vision, chest pains, shortness of breath, or weakness. Denies any side effects from medication and is content with current medication.   Patient does have been having a little bit of swelling in his legs and wants to check in on things.  He does have a cardiologist who can check in there but we will check on his kidneys liver.  He denies any urinary issues or breathing issues or chest pain.  He feels like he is doing pretty well except for the swelling in his legs.  He has been a lot more sedentary with Covid  Relevant past medical, surgical, family and social history reviewed and updated as indicated. Interim medical history since our last visit reviewed. Allergies and medications reviewed and updated.  Review of Systems  Constitutional: Negative for chills and fever.  Eyes: Negative for visual disturbance.  Respiratory: Negative for shortness of breath and wheezing.   Cardiovascular: Positive for leg swelling. Negative for chest pain.  Musculoskeletal: Negative for back pain and gait problem.  Skin: Negative for rash.  Neurological: Negative for  dizziness, weakness and light-headedness.  All other systems reviewed and are negative.   Per HPI unless specifically indicated above   Allergies as of 10/19/2020      Reactions   Colchicine Diarrhea, Nausea And Vomiting, Other (See Comments)   REACTION: nausea, diarrhea and interactions with 67m      Medication List       Accurate as of October 19, 2020  9:17 AM. If you have any questions, ask your nurse or doctor.        allopurinol 100 MG tablet Commonly known as: ZYLOPRIM TAKE 1 TABLET BY MOUTH TWICE A DAY   aspirin 81 MG EC tablet Take 81 mg by mouth daily.   atorvastatin 40 MG tablet Commonly known as: LIPITOR TAKE 1 TABLET (40 MG TOTAL) BY MOUTH DAILY AT 6 PM.   baclofen 10 MG tablet Commonly known as: LIORESAL TAKE 1 TABLET BY MOUTH AT BEDTIME AS NEEDED FOR MUSCLE SPASMS   fluticasone 50 MCG/ACT nasal spray Commonly known as: FLONASE Place 2 sprays into both nostrils daily.   gemfibrozil 600 MG tablet Commonly known as: LOPID TAKE 1 TABLET BY MOUTH EVERY DAY   hydrochlorothiazide 12.5 MG capsule Commonly known as: MICROZIDE TAKE 1 CAPSULE (12.5 MG TOTAL) BY MOUTH DAILY. AS DIRECTED   HYDROmorphone 2 MG tablet Commonly known as: Dilaudid Take 1 tablet (2 mg total) by mouth every 4 (four) hours as needed for severe pain.   Klor-Con M20 20 MEQ tablet Generic drug: potassium chloride SA TAKE 2 TABLETS  BY MOUTH DAILY   losartan 25 MG tablet Commonly known as: Cozaar Take 1 tablet (25 mg total) by mouth daily.   metoprolol tartrate 50 MG tablet Commonly known as: LOPRESSOR TAKE 1 TABLET BY MOUTH TWICE A DAY   nystatin-triamcinolone cream Commonly known as: MYCOLOG II Apply 1 application topically 2 (two) times daily.   Omega-3 1000 MG Caps Take 2 capsules by mouth 2 (two) times daily.   Vitamin D3 25 MCG (1000 UT) Caps Take 4 capsules by mouth every morning.        Objective:   BP (!) 141/82   Pulse 61   Ht 5' 8"  (1.727 m)   Wt 247 lb  (112 kg)   SpO2 98%   BMI 37.56 kg/m   Wt Readings from Last 3 Encounters:  10/19/20 247 lb (112 kg)  05/20/20 249 lb 9.6 oz (113.2 kg)  05/16/20 (!) 247 lb (112 kg)    Physical Exam Vitals and nursing note reviewed.  Constitutional:      General: He is not in acute distress.    Appearance: He is well-developed and well-nourished. He is not diaphoretic.  Eyes:     General: No scleral icterus.    Extraocular Movements: EOM normal.     Conjunctiva/sclera: Conjunctivae normal.  Neck:     Thyroid: No thyromegaly.  Cardiovascular:     Rate and Rhythm: Normal rate and regular rhythm.     Pulses: Intact distal pulses.     Heart sounds: Normal heart sounds. No murmur heard.   Pulmonary:     Effort: Pulmonary effort is normal. No respiratory distress.     Breath sounds: Normal breath sounds. No wheezing.  Musculoskeletal:        General: Swelling (2+ bilateral lower extremity pitting edema.) present. No edema. Normal range of motion.     Cervical back: Neck supple.  Lymphadenopathy:     Cervical: No cervical adenopathy.  Skin:    General: Skin is warm and dry.     Findings: No rash.  Neurological:     Mental Status: He is alert and oriented to person, place, and time.     Coordination: Coordination normal.  Psychiatric:        Mood and Affect: Mood and affect normal.        Behavior: Behavior normal.       Assessment & Plan:   Problem List Items Addressed This Visit      Cardiovascular and Mediastinum   HTN (hypertension)   Relevant Medications   atorvastatin (LIPITOR) 40 MG tablet   gemfibrozil (LOPID) 600 MG tablet   hydrochlorothiazide (MICROZIDE) 12.5 MG capsule   metoprolol tartrate (LOPRESSOR) 50 MG tablet   losartan (COZAAR) 25 MG tablet   Other Relevant Orders   CBC with Differential/Platelet     Other   Hyperlipidemia - Primary   Relevant Medications   atorvastatin (LIPITOR) 40 MG tablet   gemfibrozil (LOPID) 600 MG tablet   hydrochlorothiazide  (MICROZIDE) 12.5 MG capsule   metoprolol tartrate (LOPRESSOR) 50 MG tablet   losartan (COZAAR) 25 MG tablet   Other Relevant Orders   Lipid panel    Other Visit Diagnoses    Essential hypertension       Relevant Medications   atorvastatin (LIPITOR) 40 MG tablet   gemfibrozil (LOPID) 600 MG tablet   hydrochlorothiazide (MICROZIDE) 12.5 MG capsule   metoprolol tartrate (LOPRESSOR) 50 MG tablet   losartan (COZAAR) 25 MG tablet   Other Relevant Orders  CMP14+EGFR   Peripheral edema          Continue current medication, recommended compression stockings and elevation of his legs when he sitting and to follow-up with cardiologist if the blood work is all normal. Follow up plan: Return in about 3 months (around 01/17/2021), or if symptoms worsen or fail to improve, for Hypertension and cholesterol recheck.  Counseling provided for all of the vaccine components Orders Placed This Encounter  Procedures  . CBC with Differential/Platelet  . CMP14+EGFR  . Lipid panel    Caryl Pina, MD [facility] 10/19/2020, 9:17 AM

## 2020-10-27 ENCOUNTER — Other Ambulatory Visit: Payer: Self-pay | Admitting: Family Medicine

## 2020-10-27 DIAGNOSIS — R718 Other abnormality of red blood cells: Secondary | ICD-10-CM

## 2020-10-27 NOTE — Progress Notes (Signed)
Will call on his labs ordered on there so can explain to patient why referral was done

## 2020-10-28 ENCOUNTER — Telehealth: Payer: Self-pay

## 2020-10-28 NOTE — Telephone Encounter (Signed)
Okay that is fine then, we will just monitor in the future and just have him go get blood as soon as he can.  He can avoid seeing hematology again if we have already gone that route.

## 2020-10-28 NOTE — Telephone Encounter (Signed)
Patient aware.

## 2020-10-28 NOTE — Telephone Encounter (Signed)
Patient called back in regards to lab results.  Patient states that his hemoglobin has been elevated in the past and he has been to hematology twice regarding it - once at [facility] and once at [facility].  Patient states that all test were normal and he was released form care both times.  Patient states that in the past donating blood has helped lower his hemoglobin in the past however due to [place] he has not donating in the last 2-3 years and wonders if that is why it is elevated again. Patient is planning on donating soon and wants to know if he should still see hematology again since in the past they could not find anything. Please review and advise.

## 2020-11-04 ENCOUNTER — Ambulatory Visit (HOSPITAL_COMMUNITY): Payer: Medicare HMO | Admitting: Hematology and Oncology

## 2020-11-09 DIAGNOSIS — N281 Cyst of kidney, acquired: Secondary | ICD-10-CM | POA: Diagnosis not present

## 2020-11-09 DIAGNOSIS — Z8546 Personal history of malignant neoplasm of prostate: Secondary | ICD-10-CM | POA: Diagnosis not present

## 2020-11-09 DIAGNOSIS — N476 Balanoposthitis: Secondary | ICD-10-CM | POA: Diagnosis not present

## 2020-11-10 ENCOUNTER — Other Ambulatory Visit: Payer: Self-pay | Admitting: Family Medicine

## 2020-11-10 DIAGNOSIS — S161XXA Strain of muscle, fascia and tendon at neck level, initial encounter: Secondary | ICD-10-CM

## 2020-11-10 DIAGNOSIS — H6123 Impacted cerumen, bilateral: Secondary | ICD-10-CM

## 2020-11-21 ENCOUNTER — Telehealth: Payer: Self-pay | Admitting: Internal Medicine

## 2020-11-21 ENCOUNTER — Other Ambulatory Visit: Payer: Self-pay | Admitting: Urology

## 2020-11-21 MED ORDER — HYDROMORPHONE HCL 2 MG PO TABS: 2.0000 mg | ORAL_TABLET | ORAL | 0 refills | Status: DC | PRN

## 2020-11-21 NOTE — Telephone Encounter (Signed)
Rx sent 

## 2020-11-21 NOTE — Telephone Encounter (Signed)
Inbound call from patient requesting a refill for HYDROmorphone medication be sent to [facility] on [address] in [place] please.

## 2020-11-21 NOTE — Telephone Encounter (Signed)
Dr Hilarie Fredrickson- Patient requesting refill on hydromorphone. He has not gotten a refill in nearly a year. I think this is something he uses very rarely. I would assume you want him to come for office visit next available. Until then, do you want to send rx? You would have to send since it is a controlled substance.

## 2020-11-26 ENCOUNTER — Other Ambulatory Visit (HOSPITAL_COMMUNITY): Payer: Medicare HMO

## 2020-12-07 ENCOUNTER — Encounter (HOSPITAL_BASED_OUTPATIENT_CLINIC_OR_DEPARTMENT_OTHER): Payer: Self-pay | Admitting: Urology

## 2020-12-08 ENCOUNTER — Encounter (HOSPITAL_BASED_OUTPATIENT_CLINIC_OR_DEPARTMENT_OTHER): Payer: Self-pay | Admitting: Urology

## 2020-12-08 ENCOUNTER — Other Ambulatory Visit: Payer: Self-pay

## 2020-12-08 NOTE — Progress Notes (Addendum)
Spoke w/ via phone for pre-op interview--- PT Lab needs dos---- Hurst and EKG              Lab results------ no COVID test ------ 12-10-2020 @ 2174 Arrive at ------- 1015 on 12-13-2020 NPO after MN NO Solid Food.  Clear liquids from MN until--- 0915 (this includes no chew tobacco after MN) Medications to take morning of surgery ----- Lopressor, Allopurinal Diabetic medication ----- n/a Patient Special Instructions ----- n/a Pre-Op special Istructions ----- n/a Patient verbalized understanding of instructions that were given at this phone interview. Patient denies shortness of breath, chest pain, fever, cough at this phone interview.   Anesthesia :  HTN;  PSVT;  Mesenteric artery stenosis;  Polycythemia;  Crohn's disease;  CKD3; Multinodular thyroid;  Pt denies any cardiac s&s, sob, does have peripheral swelling of ankles.  PCP:  Dr Dettinger Cassell Clement 10-19-2020 epic) Cardiologist :  Dr Gwenlyn Found (lov 05-20-2020 epic) Vascular:  Dr Trula Slade (lov 02-22-2020 epic) GI:  Dr Hilarie Fredrickson  Cassell Clement 02-04-2020 epic) Chest x-ray :  05-16-2020 epic EKG : 05-20-2020 epic Echo :  12-20-2017  epic Stress test: 01-14-2018 epic Cardiac Cath : no Activity level:  Denies any sob w/ any activity Sleep Study/ CPAP :  NO Fasting Blood Sugar :      / Checks Blood Sugar -- times a day:  N/A Blood Thinner/ Instructions Maryjane Hurter Dose: NO ASA / Instructions/ Last Dose : ASA 21m/  Pt stated told by Dr EJunious Silkto stop week before surgery, last dose 11-28-2020

## 2020-12-10 ENCOUNTER — Other Ambulatory Visit (HOSPITAL_COMMUNITY)
Admission: RE | Admit: 2020-12-10 | Discharge: 2020-12-10 | Disposition: A | Discharge status: Home / Self Care | Payer: Medicare HMO | Source: Ambulatory Visit | Attending: Urology | Admitting: Urology

## 2020-12-10 DIAGNOSIS — Z01812 Encounter for preprocedural laboratory examination: Secondary | ICD-10-CM | POA: Insufficient documentation

## 2020-12-10 DIAGNOSIS — Z20822 Contact with and (suspected) exposure to covid-19: Secondary | ICD-10-CM | POA: Insufficient documentation

## 2020-12-10 LAB — SARS CORONAVIRUS 2 (TAT 6-24 HRS): SARS Coronavirus 2: NEGATIVE

## 2020-12-11 ENCOUNTER — Other Ambulatory Visit: Payer: Self-pay | Admitting: Family Medicine

## 2020-12-13 ENCOUNTER — Other Ambulatory Visit: Payer: Self-pay

## 2020-12-13 ENCOUNTER — Ambulatory Visit (HOSPITAL_BASED_OUTPATIENT_CLINIC_OR_DEPARTMENT_OTHER): Payer: Medicare HMO | Admitting: Certified Registered"

## 2020-12-13 ENCOUNTER — Encounter (HOSPITAL_BASED_OUTPATIENT_CLINIC_OR_DEPARTMENT_OTHER)
Admission: RE | Disposition: A | Discharge status: Home / Self Care | Payer: Self-pay | Source: Home / Self Care | Attending: Urology

## 2020-12-13 ENCOUNTER — Encounter (HOSPITAL_BASED_OUTPATIENT_CLINIC_OR_DEPARTMENT_OTHER): Payer: Self-pay | Admitting: Urology

## 2020-12-13 ENCOUNTER — Ambulatory Visit (HOSPITAL_BASED_OUTPATIENT_CLINIC_OR_DEPARTMENT_OTHER)
Admission: RE | Admit: 2020-12-13 | Discharge: 2020-12-13 | Disposition: A | Discharge status: Home / Self Care | Payer: Medicare HMO | Attending: Urology | Admitting: Urology

## 2020-12-13 DIAGNOSIS — Z7982 Long term (current) use of aspirin: Secondary | ICD-10-CM | POA: Insufficient documentation

## 2020-12-13 DIAGNOSIS — L928 Other granulomatous disorders of the skin and subcutaneous tissue: Secondary | ICD-10-CM | POA: Diagnosis not present

## 2020-12-13 DIAGNOSIS — Z79899 Other long term (current) drug therapy: Secondary | ICD-10-CM | POA: Insufficient documentation

## 2020-12-13 DIAGNOSIS — Z8546 Personal history of malignant neoplasm of prostate: Secondary | ICD-10-CM | POA: Insufficient documentation

## 2020-12-13 DIAGNOSIS — I129 Hypertensive chronic kidney disease with stage 1 through stage 4 chronic kidney disease, or unspecified chronic kidney disease: Secondary | ICD-10-CM | POA: Diagnosis not present

## 2020-12-13 DIAGNOSIS — I10 Essential (primary) hypertension: Secondary | ICD-10-CM | POA: Diagnosis not present

## 2020-12-13 DIAGNOSIS — N183 Chronic kidney disease, stage 3 unspecified: Secondary | ICD-10-CM | POA: Insufficient documentation

## 2020-12-13 DIAGNOSIS — J449 Chronic obstructive pulmonary disease, unspecified: Secondary | ICD-10-CM | POA: Diagnosis not present

## 2020-12-13 DIAGNOSIS — N489 Disorder of penis, unspecified: Secondary | ICD-10-CM

## 2020-12-13 DIAGNOSIS — N476 Balanoposthitis: Secondary | ICD-10-CM | POA: Diagnosis not present

## 2020-12-13 DIAGNOSIS — Z87891 Personal history of nicotine dependence: Secondary | ICD-10-CM | POA: Insufficient documentation

## 2020-12-13 DIAGNOSIS — N481 Balanitis: Secondary | ICD-10-CM | POA: Diagnosis not present

## 2020-12-13 DIAGNOSIS — N4829 Other inflammatory disorders of penis: Secondary | ICD-10-CM | POA: Diagnosis not present

## 2020-12-13 HISTORY — DX: Erythematous condition, unspecified: L53.9

## 2020-12-13 HISTORY — DX: Chronic vascular disorders of intestine: K55.1

## 2020-12-13 HISTORY — DX: Mixed hyperlipidemia: E78.2

## 2020-12-13 HISTORY — DX: Balanoposthitis: N47.6

## 2020-12-13 HISTORY — DX: Chronic kidney disease, stage 3 unspecified: N18.30

## 2020-12-13 HISTORY — PX: PENILE BIOPSY: SHX6013

## 2020-12-13 HISTORY — DX: Supraventricular tachycardia: I47.1

## 2020-12-13 HISTORY — DX: Personal history of urinary calculi: Z87.442

## 2020-12-13 HISTORY — DX: Localized edema: R60.0

## 2020-12-13 HISTORY — DX: Cyst of kidney, acquired: N28.1

## 2020-12-13 HISTORY — DX: Personal history of colonic polyps: Z86.010

## 2020-12-13 HISTORY — DX: Personal history of malignant neoplasm of prostate: Z85.46

## 2020-12-13 HISTORY — DX: Personal history of adenomatous and serrated colon polyps: Z86.0101

## 2020-12-13 HISTORY — DX: Nontoxic multinodular goiter: E04.2

## 2020-12-13 HISTORY — DX: Supraventricular tachycardia, unspecified: I47.10

## 2020-12-13 HISTORY — DX: Unspecified abdominal pain: R10.9

## 2020-12-13 LAB — POCT I-STAT, CHEM 8
BUN: 15 mg/dL (ref 8–23)
Calcium, Ion: 1.17 mmol/L (ref 1.15–1.40)
Chloride: 107 mmol/L (ref 98–111)
Creatinine, Ser: 1.2 mg/dL (ref 0.61–1.24)
Glucose, Bld: 103 mg/dL — ABNORMAL HIGH (ref 70–99)
HCT: 51 % (ref 39.0–52.0)
Hemoglobin: 17.3 g/dL — ABNORMAL HIGH (ref 13.0–17.0)
Potassium: 3.5 mmol/L (ref 3.5–5.1)
Sodium: 143 mmol/L (ref 135–145)
TCO2: 23 mmol/L (ref 22–32)

## 2020-12-13 SURGERY — BIOPSY, PENIS
Anesthesia: General | Site: Penis

## 2020-12-13 MED ORDER — PROPOFOL 10 MG/ML IV BOLUS
INTRAVENOUS | Status: DC | PRN
  Administered 2020-12-13: 150 mg via INTRAVENOUS
  Administered 2020-12-13: 50 mg via INTRAVENOUS

## 2020-12-13 MED ORDER — SODIUM CHLORIDE 0.9 % IV SOLN: INTRAVENOUS | Status: DC

## 2020-12-13 MED ORDER — CEFAZOLIN SODIUM-DEXTROSE 2-4 GM/100ML-% IV SOLN
INTRAVENOUS | Status: AC
  Filled 2020-12-13: qty 100

## 2020-12-13 MED ORDER — FENTANYL CITRATE (PF) 100 MCG/2ML IJ SOLN: 25.0000 ug | INTRAMUSCULAR | Status: DC | PRN

## 2020-12-13 MED ORDER — MEPERIDINE HCL 25 MG/ML IJ SOLN: 6.2500 mg | INTRAMUSCULAR | Status: DC | PRN

## 2020-12-13 MED ORDER — PROPOFOL 10 MG/ML IV BOLUS
INTRAVENOUS | Status: AC
  Filled 2020-12-13: qty 20

## 2020-12-13 MED ORDER — BUPIVACAINE HCL 0.25 % IJ SOLN
INTRAMUSCULAR | Status: DC | PRN
  Administered 2020-12-13: 9 mL

## 2020-12-13 MED ORDER — DEXAMETHASONE SODIUM PHOSPHATE 10 MG/ML IJ SOLN
INTRAMUSCULAR | Status: DC | PRN
  Administered 2020-12-13: 5 mg via INTRAVENOUS

## 2020-12-13 MED ORDER — ONDANSETRON HCL 4 MG/2ML IJ SOLN
INTRAMUSCULAR | Status: DC | PRN
  Administered 2020-12-13: 4 mg via INTRAVENOUS

## 2020-12-13 MED ORDER — OXYCODONE HCL 5 MG/5ML PO SOLN: 5.0000 mg | Freq: Once | ORAL | Status: DC | PRN

## 2020-12-13 MED ORDER — ONDANSETRON HCL 4 MG/2ML IJ SOLN
INTRAMUSCULAR | Status: AC
  Filled 2020-12-13: qty 2

## 2020-12-13 MED ORDER — BUPIVACAINE-EPINEPHRINE 0.25% -1:200000 IJ SOLN
INTRAMUSCULAR | Status: AC
  Filled 2020-12-13: qty 1

## 2020-12-13 MED ORDER — EPHEDRINE SULFATE-NACL 50-0.9 MG/10ML-% IV SOSY
PREFILLED_SYRINGE | INTRAVENOUS | Status: DC | PRN
  Administered 2020-12-13 (×5): 10 mg via INTRAVENOUS

## 2020-12-13 MED ORDER — PROMETHAZINE HCL 25 MG/ML IJ SOLN: 6.2500 mg | INTRAMUSCULAR | Status: DC | PRN

## 2020-12-13 MED ORDER — FENTANYL CITRATE (PF) 100 MCG/2ML IJ SOLN
INTRAMUSCULAR | Status: DC | PRN
  Administered 2020-12-13 (×2): 50 ug via INTRAVENOUS

## 2020-12-13 MED ORDER — FENTANYL CITRATE (PF) 100 MCG/2ML IJ SOLN
INTRAMUSCULAR | Status: AC
  Filled 2020-12-13: qty 2

## 2020-12-13 MED ORDER — CEFAZOLIN SODIUM-DEXTROSE 2-4 GM/100ML-% IV SOLN
2.0000 g | Freq: Once | INTRAVENOUS | Status: AC
  Administered 2020-12-13: 2 g via INTRAVENOUS

## 2020-12-13 MED ORDER — BUPIVACAINE HCL (PF) 0.25 % IJ SOLN
INTRAMUSCULAR | Status: AC
  Filled 2020-12-13: qty 10

## 2020-12-13 MED ORDER — LIDOCAINE 2% (20 MG/ML) 5 ML SYRINGE
INTRAMUSCULAR | Status: DC | PRN
  Administered 2020-12-13: 30 mg via INTRAVENOUS

## 2020-12-13 MED ORDER — OXYCODONE HCL 5 MG PO TABS: 5.0000 mg | ORAL_TABLET | Freq: Once | ORAL | Status: DC | PRN

## 2020-12-13 MED ORDER — LIDOCAINE HCL (PF) 2 % IJ SOLN
INTRAMUSCULAR | Status: AC
  Filled 2020-12-13: qty 5

## 2020-12-13 MED ORDER — MIDAZOLAM HCL 2 MG/2ML IJ SOLN: 0.5000 mg | Freq: Once | INTRAMUSCULAR | Status: DC | PRN

## 2020-12-13 MED ORDER — BACITRACIN ZINC 500 UNIT/GM EX OINT
TOPICAL_OINTMENT | CUTANEOUS | Status: AC
  Filled 2020-12-13: qty 28.35

## 2020-12-13 MED ORDER — PHENYLEPHRINE 40 MCG/ML (10ML) SYRINGE FOR IV PUSH (FOR BLOOD PRESSURE SUPPORT)
PREFILLED_SYRINGE | INTRAVENOUS | Status: DC | PRN
  Administered 2020-12-13 (×3): 80 ug via INTRAVENOUS

## 2020-12-13 SURGICAL SUPPLY — 27 items
BLADE SURG 15 STRL LF DISP TIS (BLADE) ×1 IMPLANT
BLADE SURG 15 STRL SS (BLADE) ×2
BNDG COHESIVE 1X5 TAN STRL LF (GAUZE/BANDAGES/DRESSINGS) ×2 IMPLANT
CLOTH BEACON ORANGE TIMEOUT ST (SAFETY) ×2 IMPLANT
COVER BACK TABLE 60X90IN (DRAPES) ×2 IMPLANT
COVER MAYO STAND STRL (DRAPES) ×2 IMPLANT
COVER WAND RF STERILE (DRAPES) ×2 IMPLANT
DRAPE LAPAROTOMY 100X72 PEDS (DRAPES) ×2 IMPLANT
ELECT REM PT RETURN 9FT ADLT (ELECTROSURGICAL) ×2
ELECTRODE REM PT RTRN 9FT ADLT (ELECTROSURGICAL) ×1 IMPLANT
GAUZE PETROLATUM 1 X8 (GAUZE/BANDAGES/DRESSINGS) ×2 IMPLANT
GLOVE SURG ENC MOIS LTX SZ7.5 (GLOVE) ×2 IMPLANT
GLOVE SURG ENC MOIS LTX SZ8 (GLOVE) IMPLANT
GOWN STRL REUS W/TWL LRG LVL3 (GOWN DISPOSABLE) ×4 IMPLANT
KIT TURNOVER CYSTO (KITS) ×2 IMPLANT
MANIFOLD NEPTUNE II (INSTRUMENTS) IMPLANT
NEEDLE HYPO 25X1 1.5 SAFETY (NEEDLE) ×2 IMPLANT
NS IRRIG 500ML POUR BTL (IV SOLUTION) IMPLANT
PACK BASIN DAY SURGERY FS (CUSTOM PROCEDURE TRAY) ×2 IMPLANT
PENCIL SMOKE EVACUATOR (MISCELLANEOUS) ×2 IMPLANT
SUT CHROMIC 3 0 PS 2 (SUTURE) IMPLANT
SUT CHROMIC 3 0 SH 27 (SUTURE) ×2 IMPLANT
SUT SILK 2 0 (SUTURE)
SUT SILK 2-0 18XBRD TIE 12 (SUTURE) IMPLANT
SYR CONTROL 10ML LL (SYRINGE) IMPLANT
TUBE CONNECTING 12X1/4 (SUCTIONS) IMPLANT
WATER STERILE IRR 500ML POUR (IV SOLUTION) IMPLANT

## 2020-12-13 NOTE — Anesthesia Postprocedure Evaluation (Signed)
Anesthesia Post Note  Patient: Luis Deleon  Procedure(s) Performed: PENILE AND FORESKIN BIOPSY (N/A Penis)     Patient location during evaluation: Phase II Anesthesia Type: General Level of consciousness: awake and alert, patient cooperative and oriented Pain management: pain level controlled Vital Signs Assessment: post-procedure vital signs reviewed and stable Respiratory status: spontaneous breathing, nonlabored ventilation and respiratory function stable Cardiovascular status: blood pressure returned to baseline and stable Postop Assessment: no apparent nausea or vomiting, able to ambulate and adequate PO intake Anesthetic complications: no   No complications documented.  Last Vitals:  Vitals:   12/13/20 1336 12/13/20 1345  BP:  (!) 141/81  Pulse: 73 73  Resp: 16 16  Temp:    SpO2: 95% 95%    Last Pain:  Vitals:   12/13/20 1330  TempSrc:   PainSc: 0-No pain                 Renetta Suman,E. Sagrario Lineberry

## 2020-12-13 NOTE — Transfer of Care (Signed)
Immediate Anesthesia Transfer of Care Note  Patient: Luis Deleon  Procedure(s) Performed: PENILE AND FORESKIN BIOPSY (N/A Penis)  Patient Location: PACU  Anesthesia Type:General  Level of Consciousness: drowsy and patient cooperative  Airway & Oxygen Therapy: Patient Spontanous Breathing and Patient connected to face mask oxygen  Post-op Assessment: Report given to RN and Post -op Vital signs reviewed and stable  Post vital signs: Reviewed and stable  Last Vitals:  Vitals Value Taken Time  BP 141/75 12/13/20 1315  Temp 36.4 C 12/13/20 1306  Pulse 77 12/13/20 1316  Resp 15 12/13/20 1316  SpO2 99 % 12/13/20 1316  Vitals shown include unvalidated device data.  Last Pain:  Vitals:   12/13/20 1306  TempSrc:   PainSc: 0-No pain      Patients Stated Pain Goal: 3 (40/34/74 2595)  Complications: No complications documented.

## 2020-12-13 NOTE — Discharge Instructions (Signed)
Incision Care, Adult An incision is a cut that a doctor makes in your skin for surgery. Most times, these cuts are closed after surgery. Your cut from surgery may be closed with:  Stitches (sutures).  Staples.  Skin glue.  Skin tape (adhesive) strips. You may need to return to your doctor to have stitches or staples taken out. This may happen many days or many weeks after your surgery. You need to take good care of your cut so it does not get infected. Follow instructions from your doctor about how to care for your cut. Supplies needed:  Soap and water.  A clean hand towel.  Wound cleanser.  A clean bandage (dressing), if needed.  Cream or ointment, if told by your doctor.  Clean gauze. How to care for your cut from surgery Cleaning your cut Ask your doctor how to clean your cut. You may need to:  Use mild soap and water, or a wound cleanser.  Use a clean gauze to pat your cut dry after you clean it. Changing your bandage  Wash your hands with soap and water for at least 20 seconds before and after you change your bandage. If you cannot use soap and water, use hand sanitizer.  Change your bandage as told by your doctor.  Leave stitches, staples, skin glue, or skin tape strips in place. They may need to stay in place for 2 weeks or longer. If tape strips get loose and curl up, you may trim the loose edges. Do not remove tape strips completely unless your doctor says it is okay.  Put a cream or ointment on your cut. Do this only as told.  Cover your cut with a clean bandage.  Ask your doctor when you can leave your cut uncovered. Checking for infection Check your cut area every day for signs of infection. Check for:  More redness, swelling, or pain.  More fluid or blood.  Warmth.  Pus or a bad smell.   Follow these instructions at home Medicines  Take over-the-counter and prescription medicines only as told by your doctor.  If you were prescribed an antibiotic  medicine, cream, or ointment, use it as told by your doctor. Do not stop using the antibiotic even if your condition improves. Eating and drinking  Eat foods that have a lot of certain nutrients, such as protein, vitamin A, and vitamin C. These foods help your cut heal. ? Foods rich in protein include meat, fish, eggs, dairy, beans, and nuts. ? Foods rich in vitamin A include carrots and dark green, leafy vegetables. ? Foods rich in vitamin C include citrus fruits, tomatoes, broccoli, and peppers.  Drink enough fluid to keep your pee (urine) pale yellow. General instructions  Do not take baths, swim, use a hot tub, or put your cut underwater until your doctor approves. Ask your doctor if you may take showers. You may only be allowed to take sponge baths.  Limit movement around your cut. This helps with healing. ? Try not to strain, lift, or exercise for the first 2 weeks, or for as long as told by your doctor. ? Return to your normal activities as told by your doctor. Ask your doctor what activities are safe for you.  Do not scratch or pick at your cut. Keep it covered as told by your doctor.  Protect your cut from the sun when you are outside for the first 6 months, or for as long as told by your doctor. Cover up  the scar area or put on sunscreen that has an SPF of at least 34.  Do not use any products that contain nicotine or tobacco, such as cigarettes, e-cigarettes, and chewing tobacco. These can delay cut healing after surgery. If you need help quitting, ask your doctor.  Keep all follow-up visits as told by your doctor. This is important.   Contact a doctor if:  You have any of these signs of infection around your cut: ? More redness, swelling, or pain. ? More fluid or blood. ? Warmth. ? Pus or a bad smell.  You have a fever.  You feel like you may vomit (nauseous).  You vomit.  You are dizzy.  Your stitches, staples, skin glue, or tape strips come undone. Get help  right away if:  Your cut has a red streak coming from it.  Your cut bleeds through your bandage, and bleeding does not stop with gentle pressure.  Your cut opens up and comes apart.  Your body reacts very badly to an infection. This may include: ? A fever, chills, or feeling cold. ? Feeling mixed up, worried, or nervous. ? Very bad pain. ? Trouble breathing. ? A fast heartbeat. ? Clammy or sweaty skin. ? A rash. These symptoms may be an emergency. Do not wait to see if the symptoms will go away. Get medical help right away. Call your local emergency services (911 in the [place] Do not drive yourself to the hospital. Summary  Follow instructions from your doctor about how to care for your cut from surgery.  Wash your hands with soap and water for at least 20 seconds before and after you change your bandage. If you cannot use soap and water, use hand sanitizer.  Check your cut area every day for signs of infection.  Keep all follow-up visits as told by your doctor. This is important. This information is not intended to replace advice given to you by your health care provider. Make sure you discuss any questions you have with your health care provider. Document Revised: 07/22/2019 Document Reviewed: 07/22/2019 Elsevier Patient Education  2021 [place] Instructions  Activity: Get plenty of rest for the remainder of the day. A responsible individual must stay with you for 24 hours following the procedure.  For the next 24 hours, DO NOT: -Drive a car -Paediatric nurse -Drink alcoholic beverages -Take any medication unless instructed by your physician -Make any legal decisions or sign important papers.  Meals: Start with liquid foods such as gelatin or soup. Progress to regular foods as tolerated. Avoid greasy, spicy, heavy foods. If nausea and/or vomiting occur, drink only clear liquids until the nausea and/or vomiting subsides. Call your  physician if vomiting continues.  Special Instructions/Symptoms: Your throat may feel dry or sore from the anesthesia or the breathing tube placed in your throat during surgery. If this causes discomfort, gargle with warm salt water. The discomfort should disappear within 24 hours.  If you had a scopolamine patch placed behind your ear for the management of post- operative nausea and/or vomiting:  1. The medication in the patch is effective for 72 hours, after which it should be removed.  Wrap patch in a tissue and discard in the trash. Wash hands thoroughly with soap and water. 2. You may remove the patch earlier than 72 hours if you experience unpleasant side effects which may include dry mouth, dizziness or visual disturbances. 3. Avoid touching the patch. Wash your hands  with soap and water after contact with the patch.

## 2020-12-13 NOTE — H&P (Signed)
H&P  Chief Complaint: Glans erythema  History of Present Illness: Luis Deleon is a 80 year old male with a history of phimosis.  He underwent a dorsal slit a few years ago and then developed erythema of his foreskin and glans.  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  He was brought today for wedge biopsy.  He is well.  Past Medical History:  Diagnosis Date  . Arthritis   . Balanoposthitis   . CKD (chronic kidney disease), stage III ([place])   . Crohn disease ([place])    followed by dr Hilarie Fredrickson--- dx 1984, terminal ileum-- prolonged clinical remission since bowel resection 2003  . Diverticulosis   . Edema of both lower extremities   . Erythema    penis  . Fatty liver    followed by dr Hilarie Fredrickson  . Gout    12-08-2020  per pt last episode approx. 2016  right great toe  . Hepatic cyst   . History of adenomatous polyp of colon   . History of kidney stones   . History of prostate cancer    11-16-2004  s/p  radical prostatectomy with pelvic lymph node dissection  . History of small bowel obstruction 2003   s/p  bowel resection  . Hypertension    followed by pcp   (nuclear study 01-14-2018 in epic low risk without ischemia , nuclear ef 51% per cardiology note from dr berry)  . Intermittent abdominal pain    followed by dr pyrtle---  due to adhesions and partial sbo  . Internal hemorrhoids   . Mesenteric artery stenosis [facility])    vascular-- dr Trula Slade  . Mixed hyperlipidemia   . Multinodular thyroid    Benign bilateral bx's 08-09-2010 ;  last ultrasound in epic 09-19-2011   . Nephrolithiasis   . Polycythemia    currently followed by pcp;   previously seen by hematologist/ oncology--- dr Jerilynn Mages. Julien Nordmann,  lov in epic 01-28-2013  hx phlebotomy and donates blood  . PSVT (paroxysmal supraventricular tachycardia) [facility])    cardiologist--- dr Gwenlyn Found ; episdoe at time of prostatectomy 2006 and recurrent 03/ 2019;  event monitor 01-15-2018 showed NSR,  echo  12-20-2017 with ef 60-65% mild LVH mild TR  . Renal cyst, acquired    urologist-- dr Junious Silk  . Vitamin D deficiency    Past Surgical History:  Procedure Laterality Date  . BACTERIAL OVERGROWTH TEST N/A 06/15/2016   Procedure: BACTERIAL OVERGROWTH TEST;  Surgeon: Md Physician Gastroenterology, MD;  Location: [facility];  Service: Gastroenterology;  Laterality: N/A;  . COLONOSCOPY WITH PROPOFOL  last one 06-24-2019  dr pyrtle  . CYSTOSCOPY  WITH URETHRAL DORSAL FORSKIN INCISION  10-10-2018  @[facility]   . EXPLORATORY LAPAROTOMY W/ BOWEL RESECTION  05-22-2002  @[facility]    distal ileum and cecum / appendectomy    . LAPAROSCOPIC ASSISTED VENTRAL HERNIA REPAIR  05-12-2003  @[facility]   . LAPAROSCOPY EXTENSIVE LYSIS ADHESIONS AND VENTRAL HERNIA REPAIR  02-11-2008  @[facility]   . LITHOTRIPSY  1980  . RETROPUBIC PROSTATECTOMY  11-16-2004   @[facility]   . TONSILLECTOMY  age 71    Home Medications:  Medications Prior to Admission  Medication Sig Dispense Refill Last Dose  . allopurinol (ZYLOPRIM) 100 MG tablet Take 1 tablet (100 mg total) by mouth 2 (two) times daily. (Patient taking differently: Take 100 mg by mouth 2 (two) times daily.) 180 tablet 3 12/13/2020 at 0800  . aspirin 81 MG EC tablet Take 81 mg by  mouth daily.   Past Month at Unknown time  . atorvastatin (LIPITOR) 40 MG tablet Take 1 tablet (40 mg total) by mouth daily at 6 PM. (Patient taking differently: Take 40 mg by mouth at bedtime.) 90 tablet 3 12/12/2020 at Unknown time  . Cholecalciferol (VITAMIN D3) 125 MCG (5000 UT) CAPS Take 1 capsule by mouth daily.   Past Month at Unknown time  . fluticasone (FLONASE) 50 MCG/ACT nasal spray Place 2 sprays into both nostrils daily. (Patient taking differently: Place 2 sprays into both nostrils daily as needed.) 16 g 6 Past Month at Unknown time  . gemfibrozil (LOPID) 600 MG tablet Take 1 tablet (600 mg total) by mouth daily. (Patient taking differently: Take 600 mg by mouth 3 (three) times a week. Monday , Wednesday, Friday)  90 tablet 3 12/12/2020 at Unknown time  . hydrochlorothiazide (MICROZIDE) 12.5 MG capsule Take 1 capsule (12.5 mg total) by mouth daily. As directed 90 capsule 3 12/12/2020 at Unknown time  . HYDROmorphone (DILAUDID) 2 MG tablet Take 1 tablet (2 mg total) by mouth every 4 (four) hours as needed for severe pain. 30 tablet 0 Past Month at Unknown time  . KLOR-CON M20 20 MEQ tablet TAKE 2 TABLETS BY MOUTH DAILY 180 tablet 0 12/12/2020 at Unknown time  . losartan (COZAAR) 25 MG tablet Take 1 tablet (25 mg total) by mouth daily. (Patient taking differently: Take 25 mg by mouth daily.) 90 tablet 3 12/12/2020 at Unknown time  . metoprolol tartrate (LOPRESSOR) 50 MG tablet Take 1 tablet (50 mg total) by mouth 2 (two) times daily. 180 tablet 3 12/13/2020 at 0815  . OMEGA-3 1000 MG CAPS Take 2 capsules by mouth 2 (two) times daily.   Past Month at Unknown time  . baclofen (LIORESAL) 10 MG tablet TAKE 1 TABLET BY MOUTH AT BEDTIME AS NEEDED FOR MUSCLE SPASMS 30 tablet 1 More than a month at Unknown time  . nystatin-triamcinolone (MYCOLOG II) cream Apply 1 application topically 2 (two) times daily. (Patient taking differently: Apply 1 application topically 2 (two) times daily as needed.) 30 g 3 More than a month at Unknown time   Allergies:  Allergies  Allergen Reactions  . Colchicine Diarrhea, Nausea And Vomiting and Other (See Comments)    REACTION: nausea, diarrhea and interactions with 52m    Family History  Problem Relation Age of Onset  . Heart disease Mother   . Cervical cancer Mother   . Heart disease Father   . Heart failure Father   . Dislocations Daughter 8       Bilateral knee  . Heart disease Maternal Uncle   . Colon cancer Neg Hx   . Esophageal cancer Neg Hx   . Rectal cancer Neg Hx   . Stomach cancer Neg Hx   . Colon polyps Neg Hx    Social History:  reports that he quit smoking about 29 years ago. His smoking use included cigarettes. He has a 38.00 pack-year smoking history. His  smokeless tobacco use includes chew. He reports that he does not drink alcohol and does not use drugs.  ROS: A complete review of systems was performed.  All systems are negative except for pertinent findings as noted. ROS   Physical Exam:  Vital signs in last 24 hours: Temp:  [98.4 F (36.9 C)] 98.4 F (36.9 C) (03/01 1046) Pulse Rate:  [63] 63 (03/01 1046) Resp:  [18] 18 (03/01 1046) BP: (144)/(82) 144/82 (03/01 1046) SpO2:  [98 %] 98 % (  03/01 1046) Weight:  [111.4 kg] 111.4 kg (03/01 1046) General:  Alert and oriented, No acute distress HEENT: Normocephalic, atraumatic Cardiovascular: Regular rate and rhythm Lungs: Regular rate and effort Abdomen: Soft, nontender, nondistended, no abdominal masses Back: No CVA tenderness Extremities: No edema Neurologic: Grossly intact GU: 15 mm area of bright erythematous patch on the right glans.  Laboratory Data:  Results for orders placed or performed during the hospital encounter of 12/13/20 (from the past 24 hour(s))  I-STAT, chem 8     Status: Abnormal   Collection Time: 12/13/20 10:39 AM  Result Value Ref Range   Sodium 143 135 - 145 mmol/L   Potassium 3.5 3.5 - 5.1 mmol/L   Chloride 107 98 - 111 mmol/L   BUN 15 8 - 23 mg/dL   Creatinine, Ser 1.20 0.61 - 1.24 mg/dL   Glucose, Bld 103 (H) 70 - 99 mg/dL   Calcium, Ion 1.17 1.15 - 1.40 mmol/L   TCO2 23 22 - 32 mmol/L   Hemoglobin 17.3 (H) 13.0 - 17.0 g/dL   HCT 51.0 39.0 - 52.0 %   Recent Results (from the past 240 hour(s))  SARS CORONAVIRUS 2 (TAT 6-24 HRS) Nasopharyngeal Nasopharyngeal Swab     Status: None   Collection Time: 12/10/20  9:53 AM   Specimen: Nasopharyngeal Swab  Result Value Ref Range Status   SARS Coronavirus 2 NEGATIVE NEGATIVE Final    Comment: (NOTE) SARS-CoV-2 target nucleic acids are NOT DETECTED.  The SARS-CoV-2 RNA is generally detectable in upper and lower respiratory specimens during the acute phase of infection. Negative results do not preclude  SARS-CoV-2 infection, do not rule out co-infections with other pathogens, and should not be used as the sole basis for treatment or other patient management decisions. Negative results must be combined with clinical observations, patient history, and epidemiological information. The expected result is Negative.  Fact Sheet for Patients: SugarRoll.be  Fact Sheet for Healthcare Providers: https://www.woods-mathews.com/  This test is not yet approved or cleared by the [place] FDA and  has been authorized for detection and/or diagnosis of SARS-CoV-2 by FDA under an Emergency Use Authorization (EUA). This EUA will remain  in effect (meaning this test can be used) for the duration of the COVID-19 declaration under Se ction 564(b)(1) of the Act, 21 U.S.C. section 360bbb-3(b)(1), unless the authorization is terminated or revoked sooner.  Performed at [facility], [place] [address] [place], [place] [address]    Creatinine: Recent Labs    12/13/20 1039  CREATININE 1.20    Impression/Assessment/plan:  I discussed with the patient the nature, potential benefits, risks and alternatives to glans biopsy, including side effects of the proposed treatment, the likelihood of the patient achieving the goals of the procedure, and any potential problems that might occur during the procedure or recuperation. All questions answered. Patient elects to proceed.    Luis Deleon 12/13/2020, 12:13 PM

## 2020-12-13 NOTE — Op Note (Signed)
Preoperative diagnosis: Glans erythema Postoperative diagnosis: Glans erythema  Procedure: Glans biopsy, 15 mm  Surgeon: Junious Silk  Assistant: Sheppard Coil, resident Surgeon  Anesthesia: General  Indication for procedure: [name] is a 80 year old male with a history of phimosis.  He underwent dorsal incision and has had no problems with the foreskin but he had a bright conspicuous area of erythema on the glans that was concerning for a premalignant or malignant lesion.  He is brought for biopsy.  Findings: On exam under anesthesia there was no phimosis.  The foreskin appeared normal.  There was some mild glanular adhesions which were released.  On the right lateral edge of the glans about two thirds of the way up from the coronal sulcus and the meatus there was a bright spot of erythema concerning for possible carcinoma and adjacent to this a flat almost white area which may be where some of the skin had been pulled off in the past from adhesions.  There were no other worrisome lesions.  Meatus appeared normal.  No stenosis.  Penile skin was normal.  No inguinal lymphadenopathy.  Description of procedure: After consent was attained to try to the operating room.  After adequate anesthesia the external genitalia were prepped and draped in the usual sterile fashion.  A 10 cc of plain Marcaine dorsal penile block was placed.  A Penrose was used to place a tourniquet around the penis.  The area was marked and an elliptical excisional biopsy was obtained which incorporated the erythema as well as some of the more pale area.  This was sent to pathology as glans biopsy.  The incision was then closed with a running 3-0 chromic suture.  The tourniquet was released hemostasis was excellent.  Some bacitracin was placed around the coronal sulcus and on the biopsy site.  He was then awakened and taken recovery room in stable condition.  Complications: None  Blood loss: Minimal  Specimens to pathology: Glans  penile biopsy  Drains: None  Disposition: Patient stable to PACU

## 2020-12-13 NOTE — Anesthesia Procedure Notes (Signed)
Procedure Name: LMA Insertion Date/Time: 12/13/2020 12:34 PM Performed by: Gwyndolyn Saxon, CRNA Pre-anesthesia Checklist: Patient identified, Emergency Drugs available, Suction available and Patient being monitored Patient Re-evaluated:Patient Re-evaluated prior to induction Oxygen Delivery Method: Circle system utilized Preoxygenation: Pre-oxygenation with 100% oxygen Induction Type: IV induction Ventilation: Mask ventilation without difficulty and Oral airway inserted - appropriate to patient size LMA: LMA inserted LMA Size: 5.0 Number of attempts: 1 Airway Equipment and Method: Patient positioned with wedge pillow Placement Confirmation: positive ETCO2 and breath sounds checked- equal and bilateral Tube secured with: Tape Dental Injury: Teeth and Oropharynx as per pre-operative assessment

## 2020-12-13 NOTE — Anesthesia Preprocedure Evaluation (Addendum)
Anesthesia Evaluation  Patient identified by MRN, date of birth, ID band Patient awake    Reviewed: Allergy & Precautions, NPO status , Patient's Chart, lab work & pertinent test results, reviewed documented beta blocker date and time   History of Anesthesia Complications Negative for: history of anesthetic complications  Airway Mallampati: I  TM Distance: >3 FB Neck ROM: Full    Dental  (+) Edentulous Upper, Dental Advisory Given   Pulmonary COPD, former smoker,  12/10/2020 SARS coronavirus NEG   breath sounds clear to auscultation       Cardiovascular hypertension, Pt. on medications and Pt. on home beta blockers (-) angina+ DOE  + dysrhythmias Supra Ventricular Tachycardia  Rhythm:Regular Rate:Normal  '19 ECHO: EF 60-65%, mild LVH, no significant valvular abnormalities '19 Stress: EF is mildly decreased (45-54%).  Nuclear stress EF: 51%.  no ST segment deviation noted during stress.  There is a small defect of mild severity present in the basal inferior and mid inferior location. The defect is partially reversible. In setting of normal LVF, this likely represents variations in diaphragmatic attenuation but cannot rule out small infarct with peri infarct ischemia.  There is transient ischemic dilatation with a TID of which could indicate multivessel CAD.  This is an intermediate risk study due to increased TID .      Neuro/Psych negative neurological ROS     GI/Hepatic Neg liver ROS, Crohn's   Endo/Other  Morbid obesity  Renal/GU Renal InsufficiencyRenal disease   H/o prostate cancer: prostatectomy    Musculoskeletal   Abdominal (+) + obese,   Peds  Hematology negative hematology ROS (+)   Anesthesia Other Findings   Reproductive/Obstetrics                            Anesthesia Physical Anesthesia Plan  ASA: III  Anesthesia Plan: General   Post-op Pain Management:     Induction: Intravenous  PONV Risk Score and Plan: 2 and Ondansetron and Dexamethasone  Airway Management Planned: LMA  Additional Equipment: None  Intra-op Plan:   Post-operative Plan:   Informed Consent: I have reviewed the patients History and Physical, chart, labs and discussed the procedure including the risks, benefits and alternatives for the proposed anesthesia with the patient or authorized representative who has indicated his/her understanding and acceptance.     Dental advisory given  Plan Discussed with: CRNA and Surgeon  Anesthesia Plan Comments:        Anesthesia Quick Evaluation

## 2020-12-14 ENCOUNTER — Encounter (HOSPITAL_BASED_OUTPATIENT_CLINIC_OR_DEPARTMENT_OTHER): Payer: Self-pay | Admitting: Urology

## 2020-12-16 LAB — SURGICAL PATHOLOGY

## 2020-12-22 ENCOUNTER — Other Ambulatory Visit: Payer: Self-pay | Admitting: Family Medicine

## 2020-12-22 DIAGNOSIS — S161XXA Strain of muscle, fascia and tendon at neck level, initial encounter: Secondary | ICD-10-CM

## 2020-12-22 DIAGNOSIS — H6123 Impacted cerumen, bilateral: Secondary | ICD-10-CM

## 2020-12-26 DIAGNOSIS — N281 Cyst of kidney, acquired: Secondary | ICD-10-CM | POA: Diagnosis not present

## 2020-12-26 DIAGNOSIS — N476 Balanoposthitis: Secondary | ICD-10-CM | POA: Diagnosis not present

## 2021-01-17 ENCOUNTER — Other Ambulatory Visit: Payer: Self-pay | Admitting: Family Medicine

## 2021-01-17 DIAGNOSIS — H6123 Impacted cerumen, bilateral: Secondary | ICD-10-CM

## 2021-01-17 DIAGNOSIS — S161XXA Strain of muscle, fascia and tendon at neck level, initial encounter: Secondary | ICD-10-CM

## 2021-01-17 NOTE — Telephone Encounter (Signed)
Pharmacy would like to change to 90 day supply, please advise.

## 2021-01-18 ENCOUNTER — Ambulatory Visit (INDEPENDENT_AMBULATORY_CARE_PROVIDER_SITE_OTHER): Payer: Medicare HMO | Admitting: Family Medicine

## 2021-01-18 ENCOUNTER — Other Ambulatory Visit: Payer: Self-pay

## 2021-01-18 ENCOUNTER — Encounter: Payer: Self-pay | Admitting: Family Medicine

## 2021-01-18 VITALS — BP 116/79 | HR 63 | Ht 68.0 in | Wt 244.0 lb

## 2021-01-18 DIAGNOSIS — D582 Other hemoglobinopathies: Secondary | ICD-10-CM

## 2021-01-18 DIAGNOSIS — Z125 Encounter for screening for malignant neoplasm of prostate: Secondary | ICD-10-CM | POA: Diagnosis not present

## 2021-01-18 DIAGNOSIS — I10 Essential (primary) hypertension: Secondary | ICD-10-CM | POA: Diagnosis not present

## 2021-01-18 DIAGNOSIS — M109 Gout, unspecified: Secondary | ICD-10-CM

## 2021-01-18 DIAGNOSIS — E782 Mixed hyperlipidemia: Secondary | ICD-10-CM

## 2021-01-18 LAB — HEMOGLOBIN, FINGERSTICK: Hemoglobin: 17.3 g/dL (ref 12.6–17.7)

## 2021-01-18 NOTE — Progress Notes (Signed)
BP 116/79   Pulse 63   Ht 5' 8" (1.727 m)   Wt 244 lb (110.7 kg)   SpO2 94%   BMI 37.10 kg/m    Subjective:   Patient ID: Luis Deleon, male    DOB: Dec 06, 1940, 80 y.o.   MRN: [id number]  HPI: Luis Deleon is a 80 y.o. male presenting on 01/18/2021 for Medical Management of Chronic Issues, Hyperlipidemia, and Hypertension   HPI Hypertension Patient is currently on hydrochlorothiazide and losartan and metoprolol, and their blood pressure today is 116/79. Patient denies any lightheadedness or dizziness. Patient denies headaches, blurred vision, chest pains, shortness of breath, or weakness. Denies any side effects from medication and is content with current medication.   Hyperlipidemia Patient is coming in for recheck of his hyperlipidemia. The patient is currently taking atorvastatin and gemfibrozil and fish oil. They deny any issues with myalgias or history of liver damage from it. They deny any focal numbness or weakness or chest pain.   Patient complains of bilateral lower extremity edema.  Patient says is been more persistent but still comes and goes somewhat nighttime.  Patient has been doing more caregiver stopped for his wife who is been more ill and that is starting to take it slowly.  He denies any chest pain or shortness of breath or wheezing.  The swelling is just in his lower legs closer to his feet.  Relevant past medical, surgical, family and social history reviewed and updated as indicated. Interim medical history since our last visit reviewed. Allergies and medications reviewed and updated.  Review of Systems  Constitutional: Negative for chills and fever.  Respiratory: Negative for shortness of breath and wheezing.   Cardiovascular: Positive for leg swelling. Negative for chest pain and palpitations.  Gastrointestinal: Negative for abdominal pain.  Musculoskeletal: Negative for back pain and gait problem.  Skin: Negative for rash.  All other systems reviewed  and are negative.   Per HPI unless specifically indicated above   Allergies as of 01/18/2021      Reactions   Colchicine Diarrhea, Nausea And Vomiting, Other (See Comments)   REACTION: nausea, diarrhea and interactions with 70m      Medication List       Accurate as of January 18, 2021  9:25 AM. If you have any questions, ask your nurse or doctor.        allopurinol 100 MG tablet Commonly known as: ZYLOPRIM Take 1 tablet (100 mg total) by mouth 2 (two) times daily.   aspirin 81 MG EC tablet Take 81 mg by mouth daily.   atorvastatin 40 MG tablet Commonly known as: LIPITOR Take 1 tablet (40 mg total) by mouth daily at 6 PM. What changed: when to take this   baclofen 10 MG tablet Commonly known as: LIORESAL TAKE 1 TABLET BY MOUTH AT BEDTIME AS NEEDED FOR MUSCLE SPASMS.   fluticasone 50 MCG/ACT nasal spray Commonly known as: FLONASE Place 2 sprays into both nostrils daily. What changed:   when to take this  reasons to take this   gemfibrozil 600 MG tablet Commonly known as: LOPID Take 1 tablet (600 mg total) by mouth daily. What changed:   when to take this  additional instructions   hydrochlorothiazide 12.5 MG capsule Commonly known as: MICROZIDE Take 1 capsule (12.5 mg total) by mouth daily. As directed   HYDROmorphone 2 MG tablet Commonly known as: Dilaudid Take 1 tablet (2 mg total) by mouth every 4 (four) hours as needed  for severe pain.   Klor-Con M20 20 MEQ tablet Generic drug: potassium chloride SA TAKE 2 TABLETS BY MOUTH DAILY   losartan 25 MG tablet Commonly known as: Cozaar Take 1 tablet (25 mg total) by mouth daily.   metoprolol tartrate 50 MG tablet Commonly known as: LOPRESSOR Take 1 tablet (50 mg total) by mouth 2 (two) times daily.   nystatin-triamcinolone cream Commonly known as: MYCOLOG II Apply 1 application topically 2 (two) times daily. What changed:   when to take this  reasons to take this   Omega-3 1000 MG Caps Take 2  capsules by mouth 2 (two) times daily.   Vitamin D3 125 MCG (5000 UT) Caps Take 1 capsule by mouth daily.        Objective:   BP 116/79   Pulse 63   Ht 5' 8" (1.727 m)   Wt 244 lb (110.7 kg)   SpO2 94%   BMI 37.10 kg/m   Wt Readings from Last 3 Encounters:  01/18/21 244 lb (110.7 kg)  12/13/20 245 lb 9.6 oz (111.4 kg)  10/19/20 247 lb (112 kg)    Physical Exam Vitals and nursing note reviewed.  Constitutional:      General: He is not in acute distress.    Appearance: He is well-developed. He is not diaphoretic.  Eyes:     General: No scleral icterus.    Conjunctiva/sclera: Conjunctivae normal.  Neck:     Thyroid: No thyromegaly.  Cardiovascular:     Rate and Rhythm: Normal rate and regular rhythm.     Heart sounds: Normal heart sounds. No murmur heard.   Pulmonary:     Effort: Pulmonary effort is normal. No respiratory distress.     Breath sounds: Normal breath sounds. No wheezing.  Musculoskeletal:        General: Swelling (1+ pitting edema in bilateral lower extremities) present. Normal range of motion.     Cervical back: Neck supple.  Lymphadenopathy:     Cervical: No cervical adenopathy.  Skin:    General: Skin is warm and dry.     Findings: No rash.  Neurological:     Mental Status: He is alert and oriented to person, place, and time.     Coordination: Coordination normal.  Psychiatric:        Behavior: Behavior normal.       Assessment & Plan:   Problem List Items Addressed This Visit      Cardiovascular and Mediastinum   HTN (hypertension)     Other   Hyperlipidemia - Primary   Relevant Orders   CMP14+EGFR   GOUT, UNSPECIFIED   Relevant Orders   CMP14+EGFR    Other Visit Diagnoses    Prostate cancer screening       Relevant Orders   PSA, total and free   Elevated hemoglobin (HCC)       Relevant Orders   Hemoglobin      Continue current medication, seems to be doing well, recommended compression stockings.  Check back in 6  months.  Hemoglobin was elevated, is more elevated today than we may send to hematology.  If it comes down we may hold off. Follow up plan: Return in about 6 months (around 07/20/2021), or if symptoms worsen or fail to improve, for Hypertension and hyperlipidemia.  Counseling provided for all of the vaccine components No orders of the defined types were placed in this encounter.   Caryl Pina, MD [facility] 01/18/2021, 9:25 AM

## 2021-01-18 NOTE — Addendum Note (Signed)
Addended by: Alphonzo Dublin on: 01/18/2021 11:36 AM   Modules accepted: Orders

## 2021-01-18 NOTE — Addendum Note (Signed)
Addended by: Alphonzo Dublin on: 01/18/2021 11:57 AM   Modules accepted: Orders

## 2021-01-19 LAB — PSA, TOTAL AND FREE
PSA, Free Pct: 0 %
PSA, Free: 0.01 ng/mL
Prostate Specific Ag, Serum: 0 ng/mL (ref 0.0–4.0)

## 2021-01-19 LAB — CMP14+EGFR
ALT: 23 IU/L (ref 0–44)
AST: 34 IU/L (ref 0–40)
Albumin/Globulin Ratio: 1.5 (ref 1.2–2.2)
Albumin: 3.8 g/dL (ref 3.7–4.7)
Alkaline Phosphatase: 64 IU/L (ref 44–121)
BUN/Creatinine Ratio: 14 (ref 10–24)
BUN: 19 mg/dL (ref 8–27)
Bilirubin Total: 1.6 mg/dL — ABNORMAL HIGH (ref 0.0–1.2)
CO2: 22 mmol/L (ref 20–29)
Calcium: 9.7 mg/dL (ref 8.6–10.2)
Chloride: 102 mmol/L (ref 96–106)
Creatinine, Ser: 1.4 mg/dL — ABNORMAL HIGH (ref 0.76–1.27)
Globulin, Total: 2.5 g/dL (ref 1.5–4.5)
Glucose: 96 mg/dL (ref 65–99)
Potassium: 4.3 mmol/L (ref 3.5–5.2)
Sodium: 143 mmol/L (ref 134–144)
Total Protein: 6.3 g/dL (ref 6.0–8.5)
eGFR: 52 mL/min/{1.73_m2} — ABNORMAL LOW (ref >59)

## 2021-02-10 ENCOUNTER — Ambulatory Visit (INDEPENDENT_AMBULATORY_CARE_PROVIDER_SITE_OTHER): Payer: Medicare HMO

## 2021-02-10 VITALS — Ht 68.0 in | Wt 243.0 lb

## 2021-02-10 DIAGNOSIS — Z0000 Encounter for general adult medical examination without abnormal findings: Secondary | ICD-10-CM

## 2021-02-10 NOTE — Progress Notes (Signed)
Subjective:   Luis Deleon is a 80 y.o. male who presents for Medicare Annual/Subsequent preventive examination.  Virtual Visit via Telephone Note  I connected with  Luis Deleon on 02/10/21 at  1:15 PM EDT by telephone and verified that I am speaking with the correct person using two identifiers.  Location: Patient: Home Provider: [facility] Persons participating in the virtual visit: patient/Nurse Health Advisor   I discussed the limitations, risks, security and privacy concerns of performing an evaluation and management service by telephone and the availability of in person appointments. The patient expressed understanding and agreed to proceed.  Interactive audio and video telecommunications were attempted between this nurse and patient, however failed, due to patient having technical difficulties OR patient did not have access to video capability.  We continued and completed visit with audio only.  Some vital signs may be absent or patient reported.   Rayvion Stumph E Fredie Majano, LPN   Review of Systems     Cardiac Risk Factors include: advanced age (>108mn, >>93women);dyslipidemia;sedentary lifestyle;obesity (BMI >30kg/m2);male gender;hypertension     Objective:    Today's Vitals   02/10/21 1242  Weight: 243 lb (110.2 kg)  Height: 5' 8"  (1.727 m)   Body mass index is 36.95 kg/m.  Advanced Directives 02/10/2021 12/13/2020 02/09/2020 12/19/2017 12/19/2017 08/23/2017 08/09/2017  Does Patient Have a Medical Advance Directive? No No No No No No No  Would patient like information on creating a medical advance directive? Yes (MAU/Ambulatory/Procedural Areas - Information given) No - Patient declined No - Patient declined No - Patient declined - No - Patient declined No - Patient declined    Current Medications (verified) Outpatient Encounter Medications as of 02/10/2021  Medication Sig  . allopurinol (ZYLOPRIM) 100 MG tablet Take 1 tablet (100 mg total) by mouth 2 (two) times daily. (Patient  taking differently: Take 100 mg by mouth 2 (two) times daily.)  . aspirin 81 MG EC tablet Take 81 mg by mouth daily.  .Marland Kitchenatorvastatin (LIPITOR) 40 MG tablet Take 1 tablet (40 mg total) by mouth daily at 6 PM. (Patient taking differently: Take 40 mg by mouth at bedtime.)  . baclofen (LIORESAL) 10 MG tablet TAKE 1 TABLET BY MOUTH AT BEDTIME AS NEEDED FOR MUSCLE SPASMS.  . Cholecalciferol (VITAMIN D3) 125 MCG (5000 UT) CAPS Take 1 capsule by mouth daily.  . fluticasone (FLONASE) 50 MCG/ACT nasal spray Place 2 sprays into both nostrils daily. (Patient taking differently: Place 2 sprays into both nostrils daily as needed.)  . gemfibrozil (LOPID) 600 MG tablet Take 1 tablet (600 mg total) by mouth daily. (Patient taking differently: Take 600 mg by mouth 3 (three) times a week. Monday , Wednesday, Friday)  . hydrochlorothiazide (MICROZIDE) 12.5 MG capsule Take 1 capsule (12.5 mg total) by mouth daily. As directed  . HYDROmorphone (DILAUDID) 2 MG tablet Take 1 tablet (2 mg total) by mouth every 4 (four) hours as needed for severe pain.  .Marland KitchenKLOR-CON M20 20 MEQ tablet TAKE 2 TABLETS BY MOUTH DAILY  . losartan (COZAAR) 25 MG tablet Take 1 tablet (25 mg total) by mouth daily. (Patient taking differently: Take 25 mg by mouth daily.)  . metoprolol tartrate (LOPRESSOR) 50 MG tablet Take 1 tablet (50 mg total) by mouth 2 (two) times daily.  .Marland Kitchennystatin-triamcinolone (MYCOLOG II) cream Apply 1 application topically 2 (two) times daily. (Patient taking differently: Apply 1 application topically 2 (two) times daily as needed.)  . OMEGA-3 1000 MG CAPS Take 2 capsules by  mouth 2 (two) times daily.   No facility-administered encounter medications on file as of 02/10/2021.    Allergies (verified) Colchicine   History: Past Medical History:  Diagnosis Date  . Arthritis   . Balanoposthitis   . CKD (chronic kidney disease), stage III ([place])   . Crohn disease ([place])    followed by dr Hilarie Fredrickson--- dx 1984, terminal ileum--  prolonged clinical remission since bowel resection 2003  . Diverticulosis   . Edema of both lower extremities   . Erythema    penis  . Fatty liver    followed by dr Hilarie Fredrickson  . Gout    12-08-2020  per pt last episode approx. 2016  right great toe  . Hepatic cyst   . History of adenomatous polyp of colon   . History of kidney stones   . History of prostate cancer    11-16-2004  s/p  radical prostatectomy with pelvic lymph node dissection  . History of small bowel obstruction 2003   s/p  bowel resection  . Hypertension    followed by pcp   (nuclear study 01-14-2018 in epic low risk without ischemia , nuclear ef 51% per cardiology note from dr berry)  . Intermittent abdominal pain    followed by dr pyrtle---  due to adhesions and partial sbo  . Internal hemorrhoids   . Mesenteric artery stenosis [facility])    vascular-- dr Trula Slade  . Mixed hyperlipidemia   . Multinodular thyroid    Benign bilateral bx's 08-09-2010 ;  last ultrasound in epic 09-19-2011   . Nephrolithiasis   . Polycythemia    currently followed by pcp;   previously seen by hematologist/ oncology--- dr Jerilynn Mages. Julien Nordmann,  lov in epic 01-28-2013  hx phlebotomy and donates blood  . PSVT (paroxysmal supraventricular tachycardia) [facility])    cardiologist--- dr Gwenlyn Found ; episdoe at time of prostatectomy 2006 and recurrent 03/ 2019;  event monitor 01-15-2018 showed NSR,  echo 12-20-2017 with ef 60-65% mild LVH mild TR  . Renal cyst, acquired    urologist-- dr Junious Silk  . Vitamin D deficiency    Past Surgical History:  Procedure Laterality Date  . BACTERIAL OVERGROWTH TEST N/A 06/15/2016   Procedure: BACTERIAL OVERGROWTH TEST;  Surgeon: Md Physician Gastroenterology, MD;  Location[facility];  Service: Gastroenterology;  Laterality: N/A;  . COLONOSCOPY WITH PROPOFOL  last one 06-24-2019  dr pyrtle  . CYSTOSCOPY  WITH URETHRAL DORSAL FORSKIN INCISION  10-10-2018  @[facility]   . EXPLORATORY LAPAROTOMY W/ BOWEL RESECTION  05-22-2002  @[facility]    distal  ileum and cecum / appendectomy    . LAPAROSCOPIC ASSISTED VENTRAL HERNIA REPAIR  05-12-2003  @[facility]   . LAPAROSCOPY EXTENSIVE LYSIS ADHESIONS AND VENTRAL HERNIA REPAIR  02-11-2008  @[facility]   . LITHOTRIPSY  1980  . PENILE BIOPSY N/A 12/13/2020   Procedure: PENILE AND FORESKIN BIOPSY;  Surgeon: Festus Aloe, MD;  Location: [facility];  Service: Urology;  Laterality: N/A;  . RETROPUBIC PROSTATECTOMY  11-16-2004   @[facility]   . TONSILLECTOMY  age 30   Family History  Problem Relation Age of Onset  . Heart disease Mother   . Cervical cancer Mother   . Heart disease Father   . Heart failure Father   . Dislocations Daughter 8       Bilateral knee  . Heart disease Maternal Uncle   . Colon cancer Neg Hx   . Esophageal cancer Neg Hx   . Rectal cancer Neg Hx   . Stomach cancer Neg Hx   .  Colon polyps Neg Hx    Social History   Socioeconomic History  . Marital status: Married    Spouse name: Katharine Look  . Number of children: 1  . Years of education: 61  . Highest education level: 12th grade  Occupational History  . Occupation: Retired     Comment: Event organiser  Tobacco Use  . Smoking status: Former Smoker    Packs/day: 1.00    Years: 38.00    Pack years: 38.00    Types: Cigarettes    Quit date: 03/19/1991    Years since quitting: 29.9  . Smokeless tobacco: Current User    Types: Chew  Vaping Use  . Vaping Use: Never used  Substance and Sexual Activity  . Alcohol use: No  . Drug use: No  . Sexual activity: Never  Other Topics Concern  . Not on file  Social History Narrative   Lives with wife. Daughter lives in [place]   Social Determinants of Health   Financial Resource Strain: Low Risk   . Difficulty of Paying Living Expenses: Not hard at all  Food Insecurity: No Food Insecurity  . Worried About Charity fundraiser in the Last Year: Never true  . Ran Out of Food in the Last Year: Never true  Transportation Needs: No Transportation Needs  . Lack of  Transportation (Medical): No  . Lack of Transportation (Non-Medical): No  Physical Activity: Inactive  . Days of Exercise per Week: 0 days  . Minutes of Exercise per Session: 0 min  Stress: No Stress Concern Present  . Feeling of Stress : Not at all  Social Connections: Moderately Isolated  . Frequency of Communication with Friends and Family: More than three times a week  . Frequency of Social Gatherings with Friends and Family: Three times a week  . Attends Religious Services: Never  . Active Member of Clubs or Organizations: No  . Attends Archivist Meetings: Never  . Marital Status: Married    Tobacco Counseling Ready to quit: Not Answered Counseling given: Not Answered   Clinical Intake:  Pre-visit preparation completed: Yes  Pain : No/denies pain     BMI - recorded: 36.95 Nutritional Status: BMI > 30  Obese Nutritional Risks: Nausea/ vomitting/ diarrhea (ate something 3 days ago that upset his stomach for a couple of hours) Diabetes: No  How often do you need to have someone help you when you read instructions, pamphlets, or other written materials from your doctor or pharmacy?: 1 - Never  Diabetic? no  Interpreter Needed?: No  Information entered by :: Jylan Loeza, LPN   Activities of Daily Living In your present state of health, do you have any difficulty performing the following activities: 02/10/2021 12/13/2020  Hearing? N N  Vision? N N  Difficulty concentrating or making decisions? N N  Walking or climbing stairs? N N  Dressing or bathing? N N  Doing errands, shopping? N -  Preparing Food and eating ? N -  Using the Toilet? N -  In the past six months, have you accidently leaked urine? Y -  Comment seeing Urologist -  Do you have problems with loss of bowel control? N -  Managing your Medications? N -  Managing your Finances? N -  Housekeeping or managing your Housekeeping? N -  Some recent data might be hidden    Patient Care  Team: Dettinger, Fransisca Kaufmann, MD as PCP - General (Family Medicine) Lorretta Harp, MD as PCP - Cardiology (  Cardiology) Festus Aloe, MD as Consulting Physician (Urology) Pyrtle, Lajuan Lines, MD as Consulting Physician (Gastroenterology) Celestia Khat, OD (Optometry)  Indicate any recent Medical Services you may have received from other than [facility] providers in the past year (date may be approximate).     Assessment:   This is a routine wellness examination for Luis Deleon.  Hearing/Vision screen  Hearing Screening   125Hz  250Hz  500Hz  1000Hz  2000Hz  3000Hz  4000Hz  6000Hz  8000Hz   Right ear:           Left ear:           Comments: Denies hearing difficulties  Vision Screening Comments: Wears eyeglasses - Annual visits with [facility] in [place] - up to date with eye exam  Dietary issues and exercise activities discussed: Current Exercise Habits: The patient does not participate in regular exercise at present, Exercise limited by: orthopedic condition(s)  Goals    . Exercise 3x per week (30 min per time)    . Patient Stated     Cut back on snacks, especially after 5 pm      Depression Screen PHQ 2/9 Scores 02/10/2021 01/18/2021 10/19/2020 05/16/2020 02/26/2020 02/09/2020 02/01/2020  PHQ - 2 Score 0 0 0 0 0 0 0    Fall Risk Fall Risk  02/10/2021 01/18/2021 10/19/2020 05/16/2020 02/26/2020  Falls in the past year? 0 0 0 0 0  Number falls in past yr: 0 - - - -  Injury with Fall? 0 - - - -  Risk for fall due to : Medication side effect;Orthopedic patient - - - -  Follow up Falls prevention discussed;Education provided - - - -    FALL RISK PREVENTION PERTAINING TO THE HOME:  Any stairs in or around the home? Yes  If so, are there any without handrails? No  Home free of loose throw rugs in walkways, pet beds, electrical cords, etc? Yes  Adequate lighting in your home to reduce risk of falls? Yes   ASSISTIVE DEVICES UTILIZED TO PREVENT FALLS:  Life alert? No  Use of a cane, walker or w/c? No  Grab  bars in the bathroom? Yes  Shower chair or bench in shower? Yes  Elevated toilet seat or a handicapped toilet? Yes   TIMED UP AND GO:  Was the test performed? No .  Telephonic visit.  Cognitive Function: MMSE - Mini Mental State Exam 02/02/2015  Orientation to time 5  Orientation to Place 5  Registration 3  Attention/ Calculation 5  Recall 3  Language- name 2 objects 2  Language- repeat 1  Language- follow 3 step command 3  Language- read & follow direction 1  Write a sentence 1  Copy design 1  Total score 30     6CIT Screen 02/10/2021 02/09/2020  What Year? 0 points 0 points  What month? 0 points 0 points  What time? 0 points 0 points  Count back from 20 0 points 0 points  Months in reverse 0 points 0 points  Repeat phrase 2 points 2 points  Total Score 2 2    Immunizations Immunization History  Administered Date(s) Administered  . Fluad Quad(high Dose 65+) 07/07/2019  . Influenza Whole 07/15/2010  . Influenza, High Dose Seasonal PF 07/31/2016, 07/15/2017, 07/17/2018  . Influenza,inj,Quad PF,6+ Mos 08/04/2013, 07/19/2014, 09/19/2015  . Influenza-Unspecified 08/11/2020  . Moderna Sars-Covid-2 Vaccination 11/30/2019, 12/28/2019, 08/30/2020  . Pneumococcal Conjugate-13 08/15/2013  . Pneumococcal Polysaccharide-23 08/08/2011  . Td 07/15/2005  . Tdap 08/20/2011  . Zoster 12/24/2013    TDAP  status: Up to date  Flu Vaccine status: Up to date  Pneumococcal vaccine status: Up to date  Covid-19 vaccine status: Completed vaccines  Qualifies for Shingles Vaccine? Yes   Zostavax completed Yes   Shingrix Completed?: No.    Education has been provided regarding the importance of this vaccine. Patient has been advised to call insurance company to determine out of pocket expense if they have not yet received this vaccine. Advised may also receive vaccine at local pharmacy or Health Dept. Verbalized acceptance and understanding.  Screening Tests Health Maintenance  Topic  Date Due  . COVID-19 Vaccine (4 - Booster for Moderna series) 02/27/2021  . INFLUENZA VACCINE  05/15/2021  . TETANUS/TDAP  08/19/2021  . COLONOSCOPY (Pts 45-59yr Insurance coverage will need to be confirmed)  06/23/2022  . PNA vac Low Risk Adult  Completed  . HPV VACCINES  Aged Out  . Hepatitis C Screening  Discontinued    Health Maintenance  There are no preventive care reminders to display for this patient.  Colorectal cancer screening: No longer required.   Lung Cancer Screening: (Low Dose CT Chest recommended if Age 78-80years, 30 pack-year currently smoking OR have quit w/in 15years.) does not qualify.   Additional Screening:  Hepatitis C Screening: does not qualify  Vision Screening: Recommended annual ophthalmology exams for early detection of glaucoma and other disorders of the eye. Is the patient up to date with their annual eye exam?  Yes  Who is the provider or what is the name of the office in which the patient attends annual eye exams? J[facility]ynetta EmeryIf pt is not established with a provider, would they like to be referred to a provider to establish care? No .   Dental Screening: Recommended annual dental exams for proper oral hygiene  Community Resource Referral / Chronic Care Management: CRR required this visit?  No   CCM required this visit?  No      Plan:     I have personally reviewed and noted the following in the patient's chart:   . Medical and social history . Use of alcohol, tobacco or illicit drugs  . Current medications and supplements . Functional ability and status . Nutritional status . Physical activity . Advanced directives . List of other physicians . Hospitalizations, surgeries, and ER visits in previous 12 months . Vitals . Screenings to include cognitive, depression, and falls . Referrals and appointments  In addition, I have reviewed and discussed with patient certain preventive protocols, quality metrics, and best practice  recommendations. A written personalized care plan for preventive services as well as general preventive health recommendations were provided to patient.     ASandrea Hammond LPN   [id number]  Nurse Notes: None

## 2021-02-10 NOTE — Patient Instructions (Signed)
[name] , Thank you for taking time to come for your Medicare Wellness Visit. I appreciate your ongoing commitment to your health goals. Please review the following plan we discussed and let me know if I can assist you in the future.   Screening recommendations/referrals: Colonoscopy: Done 06/24/2019 - Repeat not required Recommended yearly ophthalmology/optometry visit for glaucoma screening and checkup Recommended yearly dental visit for hygiene and checkup  Vaccinations: Influenza vaccine: Done 08/11/2020 - Repeat every fall Pneumococcal vaccine: Done 09/15/2011 & 08/15/2013 Tdap vaccine: Done 08/20/2011 - Repeat 10 years (Due 2022) Shingles vaccine: Zostavax done 2015; Shingrix discussed. Please contact your pharmacy for coverage information.    Covid-19: Done 11/30/19, 12/28/19, & 08/30/20  Advanced directives: Advance directive discussed with you today. I have provided a copy for you to complete at home and have notarized. Once this is complete please bring a copy in to our office so we can scan it into your chart.  Conditions/risks identified: Aim for 30 minutes of exercise or brisk walking each day, drink 6-8 glasses of water and eat lots of fruits and vegetables.  Next appointment: Follow up in one year for your annual wellness visit.   Preventive Care 110 Years and Older, Male  Preventive care refers to lifestyle choices and visits with your health care provider that can promote health and wellness. What does preventive care include?  A yearly physical exam. This is also called an annual well check.  Dental exams once or twice a year.  Routine eye exams. Ask your health care provider how often you should have your eyes checked.  Personal lifestyle choices, including:  Daily care of your teeth and gums.  Regular physical activity.  Eating a healthy diet.  Avoiding tobacco and drug use.  Limiting alcohol use.  Practicing safe sex.  Taking low doses of aspirin every  day.  Taking vitamin and mineral supplements as recommended by your health care provider. What happens during an annual well check? The services and screenings done by your health care provider during your annual well check will depend on your age, overall health, lifestyle risk factors, and family history of disease. Counseling  Your health care provider may ask you questions about your:  Alcohol use.  Tobacco use.  Drug use.  Emotional well-being.  Home and relationship well-being.  Sexual activity.  Eating habits.  History of falls.  Memory and ability to understand (cognition).  Work and work Statistician. Screening  You may have the following tests or measurements:  Height, weight, and BMI.  Blood pressure.  Lipid and cholesterol levels. These may be checked every 5 years, or more frequently if you are over 34 years old.  Skin check.  Lung cancer screening. You may have this screening every year starting at age 65 if you have a 30-pack-year history of smoking and currently smoke or have quit within the past 15 years.  Fecal occult blood test (FOBT) of the stool. You may have this test every year starting at age 38.  Flexible sigmoidoscopy or colonoscopy. You may have a sigmoidoscopy every 5 years or a colonoscopy every 10 years starting at age 64.  Prostate cancer screening. Recommendations will vary depending on your family history and other risks.  Hepatitis C blood test.  Hepatitis B blood test.  Sexually transmitted disease (STD) testing.  Diabetes screening. This is done by checking your blood sugar (glucose) after you have not eaten for a while (fasting). You may have this done every 1-3 years.  Abdominal aortic aneurysm (AAA) screening. You may need this if you are a current or former smoker.  Osteoporosis. You may be screened starting at age 37 if you are at high risk. Talk with your health care provider about your test results, treatment options,  and if necessary, the need for more tests. Vaccines  Your health care provider may recommend certain vaccines, such as:  Influenza vaccine. This is recommended every year.  Tetanus, diphtheria, and acellular pertussis (Tdap, Td) vaccine. You may need a Td booster every 10 years.  Zoster vaccine. You may need this after age 29.  Pneumococcal 13-valent conjugate (PCV13) vaccine. One dose is recommended after age 33.  Pneumococcal polysaccharide (PPSV23) vaccine. One dose is recommended after age 46. Talk to your health care provider about which screenings and vaccines you need and how often you need them. This information is not intended to replace advice given to you by your health care provider. Make sure you discuss any questions you have with your health care provider. Document Released: 10/28/2015 Document Revised: 06/20/2016 Document Reviewed: 08/02/2015 Elsevier Interactive Patient Education  2017 [employer] Prevention in the Home Falls can cause injuries. They can happen to people of all ages. There are many things you can do to make your home safe and to help prevent falls. What can I do on the outside of my home?  Regularly fix the edges of walkways and driveways and fix any cracks.  Remove anything that might make you trip as you walk through a door, such as a raised step or threshold.  Trim any bushes or trees on the path to your home.  Use bright outdoor lighting.  Clear any walking paths of anything that might make someone trip, such as rocks or tools.  Regularly check to see if handrails are loose or broken. Make sure that both sides of any steps have handrails.  Any raised decks and porches should have guardrails on the edges.  Have any leaves, snow, or ice cleared regularly.  Use sand or salt on walking paths during winter.  Clean up any spills in your garage right away. This includes oil or grease spills. What can I do in the bathroom?  Use night  lights.  Install grab bars by the toilet and in the tub and shower. Do not use towel bars as grab bars.  Use non-skid mats or decals in the tub or shower.  If you need to sit down in the shower, use a plastic, non-slip stool.  Keep the floor dry. Clean up any water that spills on the floor as soon as it happens.  Remove soap buildup in the tub or shower regularly.  Attach bath mats securely with double-sided non-slip rug tape.  Do not have throw rugs and other things on the floor that can make you trip. What can I do in the bedroom?  Use night lights.  Make sure that you have a light by your bed that is easy to reach.  Do not use any sheets or blankets that are too big for your bed. They should not hang down onto the floor.  Have a firm chair that has side arms. You can use this for support while you get dressed.  Do not have throw rugs and other things on the floor that can make you trip. What can I do in the kitchen?  Clean up any spills right away.  Avoid walking on wet floors.  Keep items that you use  a lot in easy-to-reach places.  If you need to reach something above you, use a strong step stool that has a grab bar.  Keep electrical cords out of the way.  Do not use floor polish or wax that makes floors slippery. If you must use wax, use non-skid floor wax.  Do not have throw rugs and other things on the floor that can make you trip. What can I do with my stairs?  Do not leave any items on the stairs.  Make sure that there are handrails on both sides of the stairs and use them. Fix handrails that are broken or loose. Make sure that handrails are as long as the stairways.  Check any carpeting to make sure that it is firmly attached to the stairs. Fix any carpet that is loose or worn.  Avoid having throw rugs at the top or bottom of the stairs. If you do have throw rugs, attach them to the floor with carpet tape.  Make sure that you have a light switch at the  top of the stairs and the bottom of the stairs. If you do not have them, ask someone to add them for you. What else can I do to help prevent falls?  Wear shoes that:  Do not have high heels.  Have rubber bottoms.  Are comfortable and fit you well.  Are closed at the toe. Do not wear sandals.  If you use a stepladder:  Make sure that it is fully opened. Do not climb a closed stepladder.  Make sure that both sides of the stepladder are locked into place.  Ask someone to hold it for you, if possible.  Clearly mark and make sure that you can see:  Any grab bars or handrails.  First and last steps.  Where the edge of each step is.  Use tools that help you move around (mobility aids) if they are needed. These include:  Canes.  Walkers.  Scooters.  Crutches.  Turn on the lights when you go into a dark area. Replace any light bulbs as soon as they burn out.  Set up your furniture so you have a clear path. Avoid moving your furniture around.  If any of your floors are uneven, fix them.  If there are any pets around you, be aware of where they are.  Review your medicines with your doctor. Some medicines can make you feel dizzy. This can increase your chance of falling. Ask your doctor what other things that you can do to help prevent falls. This information is not intended to replace advice given to you by your health care provider. Make sure you discuss any questions you have with your health care provider. Document Released: 07/28/2009 Document Revised: 03/08/2016 Document Reviewed: 11/05/2014 Elsevier Interactive Patient Education  2017 [employer].

## 2021-03-20 ENCOUNTER — Other Ambulatory Visit: Payer: Self-pay | Admitting: Family Medicine

## 2021-04-03 ENCOUNTER — Telehealth: Payer: Self-pay | Admitting: Cardiovascular Disease

## 2021-04-03 NOTE — Telephone Encounter (Signed)
Pt c/o of Chest Pain: STAT if CP now or developed within 24 hours  1. Are you having CP right now?  No   2. Are you experiencing any other symptoms (ex. SOB, nausea, vomiting, sweating)? Dizziness, edema   3. How long have you been experiencing CP?  About 2 days, per patient  4. Is your CP continuous or coming and going?  Coming and going   5. Have you taken Nitroglycerin?  No, patient states he does not have any nitro   STAT if patient feels like he/she is going to faint   Are you dizzy now?  No, patient states the dizziness mainly occurs when walking   Do you feel faint or have you passed out? No   Do you have any other symptoms? Edema   Have you checked your HR and BP (record if available)?  06/19: 182/74  Pt c/o swelling: STAT is pt has developed SOB within 24 hours  How much weight have you gained and in what time span?  Patient assumes he gained weight, but does not keep a log of his daily weights   If swelling, where is the swelling located?  From the ankles down   Are you currently taking a fluid pill?  Yes, patient takes hydrochlorothiazide 12.5 MG  Are you currently SOB? No   Do you have a log of your daily weights (if so, list)?  No log available  Have you gained 3 pounds in a day or 5 pounds in a week?  No   Have you traveled recently?  No    ?

## 2021-04-03 NOTE — Telephone Encounter (Signed)
Spoke with the patient who reports that he has been having some chest pressure for several days. Patient reports that it is not chest pain but feels like indigestion. Patient also reports that it hurts for him to take a deep breath sometimes. He denies any SOB. He reports that chest pressure occurs at rest and will go away on its own. He also reports that he does get lightheaded at times. It is not associated with his chest pressure. He states that it usually occurs when he stands up and when he is walking. Patient also reports swelling in his feet and ankles that has been going on for several months. He states that he has been taking HCTZ 12.5 mg daily. Reports his BP last night was 180/74. He does not have any other recent readings. Patient advised on ER precautions and aware that I will send Dr. Gwenlyn Found a message for further advisement.

## 2021-04-04 NOTE — Telephone Encounter (Signed)
Spoke to patient appointment for April 18, 2021 at 9 am. Patient verbalized understanding about appointment. Aware if symptoms become worse to call (911 and be evaluated at ER.)

## 2021-04-04 NOTE — Telephone Encounter (Signed)
Patient called back. He did not hear anything from the office yesterday

## 2021-04-18 ENCOUNTER — Encounter: Payer: Self-pay | Admitting: Cardiovascular Disease

## 2021-04-18 ENCOUNTER — Other Ambulatory Visit: Payer: Self-pay

## 2021-04-18 ENCOUNTER — Ambulatory Visit: Payer: Medicare HMO | Admitting: Cardiovascular Disease

## 2021-04-18 VITALS — BP 126/74 | HR 62 | Ht 68.0 in | Wt 244.0 lb

## 2021-04-18 DIAGNOSIS — E782 Mixed hyperlipidemia: Secondary | ICD-10-CM

## 2021-04-18 DIAGNOSIS — R0789 Other chest pain: Secondary | ICD-10-CM | POA: Diagnosis not present

## 2021-04-18 DIAGNOSIS — I10 Essential (primary) hypertension: Secondary | ICD-10-CM

## 2021-04-18 DIAGNOSIS — I471 Supraventricular tachycardia: Secondary | ICD-10-CM

## 2021-04-18 NOTE — Assessment & Plan Note (Signed)
Patient complains of atypical chest pain occurring every 3 to 4 months usually an hour after eating.  It does not sound anginal.  He did have a negative Myoview 01/14/2018 and a normal 2D echo 12/20/2017.  He was recently begun on OTC Prilosec.

## 2021-04-18 NOTE — Assessment & Plan Note (Signed)
History of PSVT in the past although none within the last year.

## 2021-04-18 NOTE — Patient Instructions (Signed)
Medication Instructions:  No Changes In Medications at this time.  *If you need a refill on your cardiac medications before your next appointment, please call your pharmacy*  Follow-Up: [facility] you and your health needs are our priority.  As part of our continuing mission to provide you with exceptional heart care, we have created designated Provider Care Teams.  These Care Teams include your primary Cardiologist (physician) and Advanced Practice Providers (APPs -  Physician Assistants and Nurse Practitioners) who all work together to provide you with the care you need, when you need it.  Your next appointment:   6 month(s)  The format for your next appointment:   In Person  Provider:   You will see one of the following Advanced Practice Providers on your designated Care Team:   Sande Rives, PA-C Coletta Memos, FNP  Then, Quay Burow, MD will plan to see you again in 12 month(s).

## 2021-04-18 NOTE — Progress Notes (Signed)
04/18/2021 [name]   02-16-1941  [id number]  Primary Physician Dettinger, Fransisca Kaufmann, MD Primary Cardiologist: Lorretta Harp MD Lupe Carney, [place]  HPI:  Luis Deleon is a 80 y.o.  moderate to severely overweight married Caucasian male father of one,and father to 2 grandchildren who is retired from Event organiser. 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 He was referred by Dr. Morrie Sheldon, his primary care physician, for cardiovascular evaluation because of a recent episode of PSVT.   I last saw him in the office 05/20/2020.  He has a history of treated hypertension and hyperlipidemia. He's never had a heart attack or stroke. He smoked remotely having stopped 25 years ago. He does have a history of Crohn's disease and prostate cancer status post radical prostatectomy remotely by Dr. Jeffie Pollock at which time he did have an episode of PSVT.he was admitted to [facility] on 12/19/17 with what appears to be PSVT which broke. His rate was 160. He had positive below troponins. A 2-D echo was normal.   Since I saw him a year ago he is remained stable.  He has complained of some chest and epigastric pain every 3 to 4 months lasting for a few minutes usually an hour after eating.  He said no episodes of PSVT.  He was recently put on an H2 blocker.  He does complain of occasional dizziness with his sounds like orthostasis.   Current Meds  Medication Sig   allopurinol (ZYLOPRIM) 100 MG tablet Take 1 tablet (100 mg total) by mouth 2 (two) times daily. (Patient taking differently: Take 100 mg by mouth 2 (two) times daily.)   aspirin 81 MG EC tablet Take 81 mg by mouth daily.   atorvastatin (LIPITOR) 40 MG tablet Take 1 tablet (40 mg total) by mouth daily at 6 PM. (Patient taking differently: Take 40 mg by mouth at bedtime.)   baclofen (LIORESAL) 10 MG tablet TAKE 1 TABLET BY MOUTH AT BEDTIME AS NEEDED FOR MUSCLE SPASMS.   Cholecalciferol (VITAMIN D3) 125 MCG (5000 UT) CAPS Take 1 capsule by mouth  daily.   fluticasone (FLONASE) 50 MCG/ACT nasal spray Place 2 sprays into both nostrils daily. (Patient taking differently: Place 2 sprays into both nostrils daily as needed.)   gemfibrozil (LOPID) 600 MG tablet Take 1 tablet (600 mg total) by mouth daily. (Patient taking differently: Take 600 mg by mouth 3 (three) times a week. Monday , Wednesday, Friday)   hydrochlorothiazide (MICROZIDE) 12.5 MG capsule Take 1 capsule (12.5 mg total) by mouth daily. As directed   HYDROmorphone (DILAUDID) 2 MG tablet Take 1 tablet (2 mg total) by mouth every 4 (four) hours as needed for severe pain.   KLOR-CON M20 20 MEQ tablet TAKE 2 TABLETS BY MOUTH DAILY   losartan (COZAAR) 25 MG tablet Take 1 tablet (25 mg total) by mouth daily. (Patient taking differently: Take 25 mg by mouth daily.)   metoprolol tartrate (LOPRESSOR) 50 MG tablet Take 1 tablet (50 mg total) by mouth 2 (two) times daily.   nystatin-triamcinolone (MYCOLOG II) cream Apply 1 application topically 2 (two) times daily. (Patient taking differently: Apply 1 application topically 2 (two) times daily as needed.)   OMEGA-3 1000 MG CAPS Take 2 capsules by mouth 2 (two) times daily.     Allergies  Allergen Reactions   Colchicine Diarrhea, Nausea And Vomiting and Other (See Comments)    REACTION: nausea, diarrhea and interactions with 89m    Social  History   Socioeconomic History   Marital status: Married    Spouse name: Katharine Look   Number of children: 1   Years of education: 12   Highest education level: 12th grade  Occupational History   Occupation: Retired     Comment: Event organiser  Tobacco Use   Smoking status: Former    Packs/day: 1.00    Years: 38.00    Pack years: 38.00    Types: Cigarettes    Quit date: 03/19/1991    Years since quitting: 30.1   Smokeless tobacco: Current    Types: Chew  Vaping Use   Vaping Use: Never used  Substance and Sexual Activity   Alcohol use: No   Drug use: No   Sexual activity: Never  Other Topics  Concern   Not on file  Social History Narrative   Lives with wife. Daughter lives in [place] Determinants of Health   Financial Resource Strain: Low Risk    Difficulty of Paying Living Expenses: Not hard at all  Food Insecurity: No Food Insecurity   Worried About Charity fundraiser in the Last Year: Never true   Arboriculturist in the Last Year: Never true  Transportation Needs: No Transportation Needs   Lack of Transportation (Medical): No   Lack of Transportation (Non-Medical): No  Physical Activity: Inactive   Days of Exercise per Week: 0 days   Minutes of Exercise per Session: 0 min  Stress: No Stress Concern Present   Feeling of Stress : Not at all  Social Connections: Moderately Isolated   Frequency of Communication with Friends and Family: More than three times a week   Frequency of Social Gatherings with Friends and Family: Three times a week   Attends Religious Services: Never   Active Member of Clubs or Organizations: No   Attends Archivist Meetings: Never   Marital Status: Married  Human resources officer Violence: Not At Risk   Fear of Current or Ex-Partner: No   Emotionally Abused: No   Physically Abused: No   Sexually Abused: No     Review of Systems: General: negative for chills, fever, night sweats or weight changes.  Cardiovascular: negative for chest pain, dyspnea on exertion, edema, orthopnea, palpitations, paroxysmal nocturnal dyspnea or shortness of breath Dermatological: negative for rash Respiratory: negative for cough or wheezing Urologic: negative for hematuria Abdominal: negative for nausea, vomiting, diarrhea, bright red blood per rectum, melena, or hematemesis Neurologic: negative for visual changes, syncope, or dizziness All other systems reviewed and are otherwise negative except as noted above.    Blood pressure 126/74, pulse 62, height 5' 8"  (1.727 m), weight 244 lb (110.7 kg).  General appearance: alert and no  distress Neck: no adenopathy, no carotid bruit, no JVD, supple, symmetrical, trachea midline, and thyroid not enlarged, symmetric, no tenderness/mass/nodules Lungs: clear to auscultation bilaterally Heart: regular rate and rhythm, S1, S2 normal, no murmur, click, rub or gallop Extremities: 1+ pitting edema bilaterally Pulses: 2+ and symmetric Skin: Skin color, texture, turgor normal. No rashes or lesions Neurologic: Grossly normal  EKG sinus rhythm at 62 without ST or T wave changes.  I personally reviewed this EKG.  ASSESSMENT AND PLAN:   Hyperlipidemia History of hyperlipidemia on statin therapy with lipid profile performed 10/19/2020 revealing a total cholesterol of 125, LDL 66 and HDL 37.  HTN (hypertension) History of essential hypertension a blood pressure measured at 126/74.  He is on hydrochlorothiazide, metoprolol and losartan.  His PCP  did reduce his losartan dose from 50 to 25 mg a day.  His blood pressures at home runs in the 140/80 range.  He does complain of occasional dizziness which sounds like orthostasis.  PSVT (paroxysmal supraventricular tachycardia) (HCC) History of PSVT in the past although none within the last year.  Atypical chest pain Patient complains of atypical chest pain occurring every 3 to 4 months usually an hour after eating.  It does not sound anginal.  He did have a negative Myoview 01/14/2018 and a normal 2D echo 12/20/2017.  He was recently begun on OTC Prilosec.     Lorretta Harp MD FACP,FACC,FAHA, [facility] 04/18/2021 9:13 AM

## 2021-04-18 NOTE — Assessment & Plan Note (Signed)
History of essential hypertension a blood pressure measured at 126/74.  He is on hydrochlorothiazide, metoprolol and losartan.  His PCP did reduce his losartan dose from 50 to 25 mg a day.  His blood pressures at home runs in the 140/80 range.  He does complain of occasional dizziness which sounds like orthostasis.

## 2021-04-18 NOTE — Assessment & Plan Note (Signed)
History of hyperlipidemia on statin therapy with lipid profile performed 10/19/2020 revealing a total cholesterol of 125, LDL 66 and HDL 37.

## 2021-05-23 ENCOUNTER — Other Ambulatory Visit: Payer: Self-pay | Admitting: Family Medicine

## 2021-05-23 DIAGNOSIS — H6123 Impacted cerumen, bilateral: Secondary | ICD-10-CM

## 2021-05-23 DIAGNOSIS — S161XXA Strain of muscle, fascia and tendon at neck level, initial encounter: Secondary | ICD-10-CM

## 2021-05-27 ENCOUNTER — Other Ambulatory Visit: Payer: Self-pay | Admitting: Family Medicine

## 2021-06-30 ENCOUNTER — Other Ambulatory Visit: Payer: Self-pay | Admitting: Family Medicine

## 2021-07-19 ENCOUNTER — Ambulatory Visit (INDEPENDENT_AMBULATORY_CARE_PROVIDER_SITE_OTHER): Payer: Medicare HMO | Admitting: Family Medicine

## 2021-07-19 ENCOUNTER — Other Ambulatory Visit: Payer: Self-pay

## 2021-07-19 ENCOUNTER — Encounter: Payer: Self-pay | Admitting: Family Medicine

## 2021-07-19 VITALS — BP 128/70 | HR 72 | Ht 68.0 in | Wt 243.0 lb

## 2021-07-19 DIAGNOSIS — E782 Mixed hyperlipidemia: Secondary | ICD-10-CM | POA: Diagnosis not present

## 2021-07-19 DIAGNOSIS — R5383 Other fatigue: Secondary | ICD-10-CM | POA: Diagnosis not present

## 2021-07-19 DIAGNOSIS — M109 Gout, unspecified: Secondary | ICD-10-CM

## 2021-07-19 DIAGNOSIS — I10 Essential (primary) hypertension: Secondary | ICD-10-CM

## 2021-07-19 DIAGNOSIS — Z23 Encounter for immunization: Secondary | ICD-10-CM

## 2021-07-19 NOTE — Progress Notes (Signed)
BP 128/70   Pulse 72   Ht _0  (1.727 m)   Wt 243 lb (110.2 kg)   SpO2 98%   BMI 36.95 kg/m    Subjective:   Patient ID: Luis Deleon, male    DOB: May 27, 1941, 80 y.o.   MRN: [id number]  HPI: Luis Deleon is a 80 y.o. male presenting on 07/19/2021 for Medical Management of Chronic Issues, Hyperlipidemia, Hypertension, Fatigue, and balance problems   HPI Hypertension Patient is currently on losartan and metoprolol and hydrochlorothiazide, and their blood pressure today is 128/70. Patient does have some dizziness. Patient denies headaches, blurred vision, chest pains, shortness of breath, or weakness. Denies any side effects from medication and is content with current medication.   Hyperlipidemia Patient is coming in for recheck of his hyperlipidemia. The patient is currently taking gemfibrozil and atorvastatin and fish oils. They deny any issues with myalgias or history of liver damage from it. They deny any focal numbness or weakness or chest pain.   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  He also says he gets lightheaded sometimes when he stands up quickly but is also been having some balance issues.  Relevant past medical, surgical, family and social history reviewed and updated as indicated. Interim medical history since our last visit reviewed. Allergies and medications reviewed and updated.  Review of Systems  Constitutional:  Positive for fatigue. Negative for chills and fever.  Eyes:  Negative for visual disturbance.  Respiratory:  Negative for shortness of breath and wheezing.   Cardiovascular:  Negative for chest pain and leg swelling.  Musculoskeletal:  Negative for back pain and gait problem.  Skin:  Negative for rash.  Neurological:  Positive for dizziness and light-headedness.  All other  systems reviewed and are negative.  Per HPI unless specifically indicated above   Allergies as of 07/19/2021       Reactions   Colchicine Diarrhea, Nausea And Vomiting, Other (See Comments)   REACTION: nausea, diarrhea and interactions with 93m        Medication List        Accurate as of July 19, 2021  9:13 AM. If you have any questions, ask your nurse or doctor.          STOP taking these medications    hydrochlorothiazide 12.5 MG capsule Commonly known as: MICROZIDE Stopped by: JFransisca KaufmannDettinger, MD       TAKE these medications    allopurinol 100 MG tablet Commonly known as: ZYLOPRIM Take 1 tablet (100 mg total) by mouth 2 (two) times daily.   aspirin 81 MG EC tablet Take 81 mg by mouth daily.   atorvastatin 40 MG tablet Commonly known as: LIPITOR Take 1 tablet (40 mg total) by mouth daily at 6 PM. What changed: when to take this   baclofen 10 MG tablet Commonly known as: LIORESAL TAKE 1 TABLET BY MOUTH AT BEDTIME AS NEEDED FOR MUSCLE SPASMS.   fluticasone 50 MCG/ACT nasal spray Commonly known as: FLONASE Place 2 sprays into both nostrils daily. What changed:  when to take this reasons to take this   gemfibrozil 600 MG tablet Commonly known as: LOPID Take 1 tablet (600 mg total) by mouth daily. What changed:  when to take this additional instructions   HYDROmorphone 2 MG tablet Commonly known  as: Dilaudid Take 1 tablet (2 mg total) by mouth every 4 (four) hours as needed for severe pain.   Klor-Con M20 20 MEQ tablet Generic drug: potassium chloride SA TAKE 2 TABLETS BY MOUTH DAILY   losartan 25 MG tablet Commonly known as: Cozaar Take 1 tablet (25 mg total) by mouth daily.   metoprolol tartrate 50 MG tablet Commonly known as: LOPRESSOR Take 1 tablet (50 mg total) by mouth 2 (two) times daily.   nystatin-triamcinolone cream Commonly known as: MYCOLOG II Apply 1 application topically 2 (two) times daily. What changed:  when to  take this reasons to take this   Omega-3 1000 MG Caps Take 2 capsules by mouth 2 (two) times daily.   Vitamin D3 125 MCG (5000 UT) Caps Take 1 capsule by mouth daily.         Objective:   BP 128/70   Pulse 72   Ht _0  (1.727 m)   Wt 243 lb (110.2 kg)   SpO2 98%   BMI 36.95 kg/m   Wt Readings from Last 3 Encounters:  07/19/21 243 lb (110.2 kg)  04/18/21 244 lb (110.7 kg)  02/10/21 243 lb (110.2 kg)    Physical Exam Vitals and nursing note reviewed.  Constitutional:      General: He is not in acute distress.    Appearance: He is well-developed. He is not diaphoretic.  Eyes:     General: No scleral icterus.    Conjunctiva/sclera: Conjunctivae normal.  Neck:     Thyroid: No thyromegaly.  Cardiovascular:     Rate and Rhythm: Normal rate and regular rhythm.     Heart sounds: Normal heart sounds. No murmur heard. Pulmonary:     Effort: Pulmonary effort is normal. No respiratory distress.     Breath sounds: Normal breath sounds. No wheezing.  Musculoskeletal:        General: No swelling. Normal range of motion.     Cervical back: Neck supple.  Lymphadenopathy:     Cervical: No cervical adenopathy.  Skin:    General: Skin is warm and dry.     Findings: No rash.  Neurological:     Mental Status: He is alert and oriented to person, place, and time.     Coordination: Coordination normal.  Psychiatric:        Behavior: Behavior normal.      Assessment & Plan:   Problem List Items Addressed This Visit       Cardiovascular and Mediastinum   HTN (hypertension) - Primary   Relevant Orders   CBC with Differential/Platelet   CMP14+EGFR     Other   Gout, unspecified   Relevant Orders   CBC with Differential/Platelet   CMP14+EGFR   Hyperlipidemia   Relevant Orders   Lipid panel   Other Visit Diagnoses     Other fatigue       Relevant Orders   Thyroid Panel With TSH     Gave a regimen of core strengthening that he can do and will stop  hydrochlorothiazide to see if that gives from the dizziness.  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  We will check thyroid levels as well.  Follow up plan: Return in about 6 months (around 01/17/2022), or if symptoms worsen or fail to improve, for Physical exam and hypertension and cholesterol.  Counseling provided for all of the vaccine components Orders Placed This Encounter  Procedures   CBC with Differential/Platelet   CMP14+EGFR  Lipid panel   Thyroid Panel With TSH     Caryl Pina, MD [facility] 07/19/2021, 9:13 AM

## 2021-07-20 LAB — CMP14+EGFR
ALT: 28 IU/L (ref 0–44)
AST: 32 IU/L (ref 0–40)
Albumin/Globulin Ratio: 1.5 (ref 1.2–2.2)
Albumin: 3.7 g/dL (ref 3.7–4.7)
Alkaline Phosphatase: 62 IU/L (ref 44–121)
BUN/Creatinine Ratio: 14 (ref 10–24)
BUN: 17 mg/dL (ref 8–27)
Bilirubin Total: 1.7 mg/dL — ABNORMAL HIGH (ref 0.0–1.2)
CO2: 21 mmol/L (ref 20–29)
Calcium: 9.5 mg/dL (ref 8.6–10.2)
Chloride: 105 mmol/L (ref 96–106)
Creatinine, Ser: 1.22 mg/dL (ref 0.76–1.27)
Globulin, Total: 2.5 g/dL (ref 1.5–4.5)
Glucose: 100 mg/dL — ABNORMAL HIGH (ref 70–99)
Potassium: 4 mmol/L (ref 3.5–5.2)
Sodium: 140 mmol/L (ref 134–144)
Total Protein: 6.2 g/dL (ref 6.0–8.5)
eGFR: 61 mL/min/{1.73_m2} (ref >59)

## 2021-07-20 LAB — CBC WITH DIFFERENTIAL/PLATELET
Basophils Absolute: 0 10*3/uL (ref 0.0–0.2)
Basos: 1 %
EOS (ABSOLUTE): 0.2 10*3/uL (ref 0.0–0.4)
Eos: 2 %
Hematocrit: 48.6 % (ref 37.5–51.0)
Hemoglobin: 16.8 g/dL (ref 13.0–17.7)
Immature Grans (Abs): 0 10*3/uL (ref 0.0–0.1)
Immature Granulocytes: 1 %
Lymphocytes Absolute: 1.6 10*3/uL (ref 0.7–3.1)
Lymphs: 20 %
MCH: 32.5 pg (ref 26.6–33.0)
MCHC: 34.6 g/dL (ref 31.5–35.7)
MCV: 94 fL (ref 79–97)
Monocytes Absolute: 0.9 10*3/uL (ref 0.1–0.9)
Monocytes: 11 %
Neutrophils Absolute: 5.2 10*3/uL (ref 1.4–7.0)
Neutrophils: 65 %
Platelets: 139 10*3/uL — ABNORMAL LOW (ref 150–450)
RBC: 5.17 x10E6/uL (ref 4.14–5.80)
RDW: 14 % (ref 11.6–15.4)
WBC: 7.9 10*3/uL (ref 3.4–10.8)

## 2021-07-20 LAB — THYROID PANEL WITH TSH
Free Thyroxine Index: 2.4 (ref 1.2–4.9)
T3 Uptake Ratio: 26 % (ref 24–39)
T4, Total: 9.2 ug/dL (ref 4.5–12.0)
TSH: 2.32 u[IU]/mL (ref 0.450–4.500)

## 2021-07-20 LAB — LIPID PANEL
Chol/HDL Ratio: 3.5 ratio (ref 0.0–5.0)
Cholesterol, Total: 116 mg/dL (ref 100–199)
HDL: 33 mg/dL — ABNORMAL LOW (ref >39)
LDL Chol Calc (NIH): 60 mg/dL (ref 0–99)
Triglycerides: 129 mg/dL (ref 0–149)
VLDL Cholesterol Cal: 23 mg/dL (ref 5–40)

## 2021-07-24 ENCOUNTER — Telehealth: Payer: Self-pay | Admitting: Internal Medicine

## 2021-07-24 NOTE — Telephone Encounter (Signed)
Patient indicates that he was told to let us know if he had a "flare" in which he required use of the hydromorphone he was given by Dr Hilarie Fredrickson. Patient states that he did have an episode of severe periumbilical abdominal pain yesterday evening/night lasting 8-12 hours. However, hydromorphone tablet was successful in alleviating that pain. He denies any nausea, vomiting or change in bowels associated with the pain. Dr Hilarie Fredrickson, any additional recommendations or continue current plan?

## 2021-07-24 NOTE — Telephone Encounter (Signed)
Patient called states he has Hydromorphone medication left but he noticed it says discard after 3/22 he would like to know what to do please call him.

## 2021-07-24 NOTE — Telephone Encounter (Signed)
Patient has been advised of Dr Vena Rua recommendations and verbalizes clear understanding.

## 2021-07-24 NOTE — Telephone Encounter (Signed)
If his "flares" have been isolated then I feel that this represents intermittent partial small bowel obstructions and not mesenteric ischemia or recurrent Crohn's.  His Crohn's disease has long been in remission. Okay to continue to treat this with hydromorphone for attacks only.  When attack start he should do clear liquid diet until attacks resolve If they are happening more frequently or to a more severe fashion I would like to see him back in clinic

## 2021-07-29 ENCOUNTER — Other Ambulatory Visit: Payer: Self-pay | Admitting: Family Medicine

## 2021-09-18 ENCOUNTER — Telehealth: Payer: Self-pay | Admitting: Family Medicine

## 2021-09-18 NOTE — Telephone Encounter (Signed)
Yes his potassium was normal on the last check so he will be fine to go off of it for short period, will probably go lower little bit but then he can restart it once it comes.  The other option is he could get it early but he would likely have to pay out-of-pocket for it because the insurance will not pay for prescription early

## 2021-09-18 NOTE — Telephone Encounter (Signed)
Pt informed of Dettinger's recommendations. Rx will be sent on 12/19.

## 2021-09-20 ENCOUNTER — Other Ambulatory Visit: Payer: Self-pay | Admitting: Family Medicine

## 2021-10-02 ENCOUNTER — Other Ambulatory Visit: Payer: Self-pay

## 2021-10-02 MED ORDER — POTASSIUM CHLORIDE CRYS ER 20 MEQ PO TBCR: 40.0000 meq | EXTENDED_RELEASE_TABLET | Freq: Every day | ORAL | 3 refills | Status: AC

## 2021-10-02 NOTE — Progress Notes (Addendum)
Cardiology Office Note:    Date:  10/03/2021   ID:  Luis Deleon 07/21/1941, MRN [id number]  PCP:  Dettinger, Fransisca Kaufmann, MD   [facility] Providers Cardiologist:  Quay Burow, MD     Referring MD: Dettinger, Fransisca Kaufmann, MD   Chief Complaint: 6 month follow-up of SVT   History of Present Illness:    Luis Deleon is a 80 y.o. male with a hx of PSVT, HTN, hyperlipidemia, prostate cancer s/p radical prostatectomy, Crohn's disease, and umbilical hernia.   His cardiac history is significant for history of PSVT, however it was not captured on telemetry in 2019.  Had ED visit on 01/09/18 for chest pain with a mild troponin elevation.  He additionally had what was felt to be SVT, but by the time he arrived in the emergency department EKG showed normal sinus rhythm. Outpatient stress myoview was reported low risk. He was last seen in our office on 04/18/21 by Dr. Gwenlyn Found and 6 month follow-up was recommended.   Today, he is here alone. He reports he has been doing well and denies reoccurrence of SVT or fast heart rate within the past few months. States he seldom has palpitations. He denies chest pain, shortness of breath, palpitations, melena, hematuria, hemoptysis, diaphoresis, weakness, presyncope, syncope, orthopnea, and PND. He has mild bilateral 1+ pitting edema of lower extremities. Reports he feels fatigued caring for his wife who requires total care. He does all the house work, cooking, and yard work as well as personal care of his wife.   Past Medical History:  Diagnosis Date   Arthritis    Balanoposthitis    CKD (chronic kidney disease), stage III ([place])    Crohn disease ([place])    followed by dr Hilarie Fredrickson--- dx 1984, terminal ileum-- prolonged clinical remission since bowel resection 2003   Diverticulosis    Edema of both lower extremities    Erythema    penis   Fatty liver    followed by dr Hilarie Fredrickson   Gout    12-08-2020  per pt last episode approx. 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 Julien Nordmann,  lov in epic 01-28-2013  hx phlebotomy and donates blood   PSVT (paroxysmal supraventricular tachycardia) [facility])    cardiologist--- dr Gwenlyn Found ; episdoe at time of prostatectomy 2006 and recurrent 03/ 2019;  event monitor 01-15-2018 showed NSR,  echo 12-20-2017 with ef 60-65% mild LVH mild TR   Renal cyst, acquired    urologist-- dr Junious Silk   Vitamin D deficiency  Past Surgical History:  Procedure Laterality Date   BACTERIAL OVERGROWTH TEST N/A 06/15/2016   Procedure: BACTERIAL OVERGROWTH TEST;  Surgeon: Md Physician Gastroenterology, MD;  Location: [facility];  Service: Gastroenterology;  Laterality: N/A;   COLONOSCOPY WITH PROPOFOL  last one 06-24-2019  dr pyrtle   CYSTOSCOPY  WITH URETHRAL DORSAL FORSKIN INCISION  10-10-2018  @[facility]    EXPLORATORY LAPAROTOMY W/ BOWEL RESECTION  05-22-2002  @[facility]    distal ileum and cecum / appendectomy     LAPAROSCOPIC ASSISTED VENTRAL HERNIA REPAIR  05-12-2003  @[facility]    LAPAROSCOPY EXTENSIVE LYSIS ADHESIONS AND VENTRAL HERNIA REPAIR  02-11-2008  @[facility]    LITHOTRIPSY  1980   PENILE BIOPSY N/A 12/13/2020   Procedure: PENILE AND FORESKIN BIOPSY;   Surgeon: Festus Aloe, MD;  Location: [facility];  Service: Urology;  Laterality: N/A;   RETROPUBIC PROSTATECTOMY  11-16-2004   @[facility]    TONSILLECTOMY  age 43    Current Medications: Current Meds  Medication Sig   allopurinol (ZYLOPRIM) 100 MG tablet Take 1 tablet (100 mg total) by mouth 2 (two) times daily. (Patient taking differently: Take 100 mg by mouth 2 (two) times daily.)   aspirin 81 MG EC tablet Take 81 mg by mouth daily.   atorvastatin (LIPITOR) 40 MG tablet TAKE 1 TABLET BY MOUTH DAILY AT 6 PM.   baclofen (LIORESAL) 10 MG tablet TAKE 1 TABLET BY MOUTH AT BEDTIME AS NEEDED FOR MUSCLE SPASMS.   Cholecalciferol (VITAMIN D3) 125 MCG (5000 UT) CAPS Take 1 capsule by mouth daily.   fluticasone (FLONASE) 50 MCG/ACT nasal spray Place 2 sprays into both nostrils daily. (Patient taking differently: Place 2 sprays into both nostrils daily as needed.)   gemfibrozil (LOPID) 600 MG tablet Take 1 tablet (600 mg total) by mouth daily. (Patient taking differently: Take 600 mg by mouth 3 (three) times a week. Monday , Wednesday, Friday)   HYDROmorphone (DILAUDID) 2 MG tablet Take 1 tablet (2 mg total) by mouth every 4 (four) hours as needed for severe pain.   losartan (COZAAR) 25 MG tablet Take 1 tablet (25 mg total) by mouth daily. (Patient taking differently: Take 25 mg by mouth daily.)   metoprolol tartrate (LOPRESSOR) 50 MG tablet TAKE 1 TABLET BY MOUTH TWICE A DAY   nystatin-triamcinolone (MYCOLOG II) cream Apply 1 application topically 2 (two) times daily. (Patient taking differently: Apply 1 application topically 2 (two) times daily as needed.)   OMEGA-3 1000 MG CAPS Take 2 capsules by mouth 2 (two) times daily.   potassium chloride SA (KLOR-CON M20) 20 MEQ tablet Take 2 tablets (40 mEq total) by mouth daily.     Allergies:   Colchicine   Social History   Socioeconomic History   Marital status: Married    Spouse name: Katharine Look   Number of children: 1   Years of  education: 12   Highest education level: 12th grade  Occupational History   Occupation: Retired     Comment: Event organiser  Tobacco Use   Smoking status: Former    Packs/day: 1.00    Years: 38.00    Pack years: 38.00    Types: Cigarettes    Quit date: 03/19/1991    Years since quitting: 30.5   Smokeless tobacco: Current    Types: Chew  Vaping Use   Vaping Use: Never used  Substance and Sexual Activity   Alcohol use: No   Drug use: No   Sexual activity: Never  Other Topics Concern   Not on  file  Social History Narrative   Lives with wife. Daughter lives in [place] Determinants of Health   Financial Resource Strain: Low Risk    Difficulty of Paying Living Expenses: Not hard at all  Food Insecurity: No Food Insecurity   Worried About Charity fundraiser in the Last Year: Never true   Arboriculturist in the Last Year: Never true  Transportation Needs: No Transportation Needs   Lack of Transportation (Medical): No   Lack of Transportation (Non-Medical): No  Physical Activity: Inactive   Days of Exercise per Week: 0 days   Minutes of Exercise per Session: 0 min  Stress: No Stress Concern Present   Feeling of Stress : Not at all  Social Connections: Moderately Isolated   Frequency of Communication with Friends and Family: More than three times a week   Frequency of Social Gatherings with Friends and Family: Three times a week   Attends Religious Services: Never   Active Member of Clubs or Organizations: No   Attends Music therapist: Never   Marital Status: Married     Family History: The patient's family history includes Cervical cancer in his mother; Dislocations (age of onset: 34) in his daughter; Heart disease in his father, maternal uncle, and mother; Heart failure in his father. There is no history of Colon cancer, Esophageal cancer, Rectal cancer, Stomach cancer, or Colon polyps.  ROS:   Please see the history of present illness.   ++bilateral 1+ pitting edema lower extremities All other systems reviewed and are negative.  Labs/Other Studies Reviewed:    The following studies were reviewed today:  Cardiac monitor 4/19 NSR, no pauses, no arrhythmia  Lexiscan 4/19  The left ventricular ejection fraction is mildly decreased (45-54%). Nuclear stress EF: 51%. There was no ST segment deviation noted during stress. There is a small defect of mild severity present in the basal inferior and mid inferior location. The defect is partially reversible. In setting of normal LVF, this likely represents variations in diaphragmatic attenuation but cannot rule out small infarct with peri infarct ischemia. There is transient ischemic dilatation with a TID of which could indicate multivessel CAD. This is an intermediate risk study due to increased TID.   Echo 3/19  Left ventricle:  The cavity size was normal. Wall thickness was  increased in a pattern of mild LVH. Systolic function was normal.  The estimated ejection fraction was in the range of 60% to 65%.  Wall motion was normal; there were no regional wall motion  abnormalities. Left ventricular diastolic function parameters were  normal.  Aortic valve:   Mildly calcified annulus. Trileaflet; mildly  thickened leaflets.  Doppler:   There was no stenosis.   There was  no significant regurgitation. Mean gradient (S): 4 mm Hg. Peak gradient (S): 6 mm Hg.  Aorta: Aortic root: The aortic root was normal in size.  Mitral valve:   Mildly calcified annulus. Normal thickness leaflets  .  Doppler:   There was no evidence for stenosis.   There was  trivial regurgitation.  Peak gradient (D): 2 mm Hg.  Left atrium:  The atrium was normal in size.  Atrial septum:  No defect or patent foramen ovale was identified.  Right ventricle:  The cavity size was normal. Systolic function was  normal.  Pulmonic valve:   Not well visualized.  Doppler:   There was no  evidence for stenosis.    There was no significant regurgitation.  Tricuspid valve:   Normal thickness leaflets.  Doppler:   There was  no evidence for stenosis.   There was mild regurgitation.  Pulmonary artery:   Systolic pressure was within the normal range.  Right atrium:  The atrium was normal in size.  Pericardium: There was no pericardial effusion.  Systemic veins:  Inferior vena cava: The vessel was normal in size. The  respirophasic diameter changes were in the normal range (>= 50%),  consistent with normal central venous pressure.  Recent Labs: 07/19/2021: ALT 28; BUN 17; Creatinine, Ser 1.22; Hemoglobin 16.8; Platelets 139; Potassium 4.0; Sodium 140; TSH 2.320  Recent Lipid Panel    Component Value Date/Time   CHOL 116 07/19/2021 0919   CHOL 128 09/02/2013 0818   TRIG 129 07/19/2021 0919   TRIG 202 (H) 09/28/2016 0913   TRIG 205 (H) 09/02/2013 0818   HDL 33 (L) 07/19/2021 0919   HDL 37 (L) 09/28/2016 0913   HDL 37 (L) 09/02/2013 0818   CHOLHDL 3.5 07/19/2021 0919   LDLCALC 60 07/19/2021 0919   LDLCALC 62 07/19/2014 0832   LDLCALC 50 09/02/2013 0818      Physical Exam:    VS:  BP 132/84    Pulse 68    Ht 5' 9"  (1.753 m)    Wt 243 lb 6.4 oz (110.4 kg)    SpO2 96%    BMI 35.94 kg/m     Wt Readings from Last 3 Encounters:  10/03/21 243 lb 6.4 oz (110.4 kg)  07/19/21 243 lb (110.2 kg)  04/18/21 244 lb (110.7 kg)     GEN:  Well nourished, well developed in no acute distress HEENT: Normal NECK: No JVD; No carotid bruits LYMPHATICS: No lymphadenopathy CARDIAC: RRR, 1/6 systolic murmur best auscultated RUSB. No rubs, gallops RESPIRATORY:  Clear to auscultation without rales, wheezing or rhonchi  ABDOMEN: Soft, non-tender, non-distended MUSCULOSKELETAL:  Bilateral 1+ pitting edema lower extremities; No deformity  SKIN: Warm and dry NEUROLOGIC:  Alert and oriented x 3 PSYCHIATRIC:  Normal affect   EKG:  EKG is not ordered today.    Diagnoses:    1. Abdominal aortic atherosclerosis  ([place])   2. Essential hypertension   3. PSVT (paroxysmal supraventricular tachycardia) ([place])   4. Bilateral lower extremity edema   5. Mixed hyperlipidemia    Assessment and Plan:     Abdominal aortic atherosclerosis/ Mixed hyperlipidemia: LDL 60 on 07/19/21. Aortic atherosclerosis identified on CT. Do not have recent imaging of coronary arteries. He denies chest pain. Do not feel that ischemia evaluation is warranted at this time. BP is stable.  He denies bleeding concerns. Encouraged weight loss and heart healthy diet. Continue atorvastatin, gemfibrozil, aspirin.   Essential hypertension: BP is stable today. He reports home readings of systolic BP 233-007. His losartan and metoprolol were recently reduced by PCP due to dizziness. He denies dizziness today. Continue losartan, metoprolol.   PSVT: Has seldom palpitations and denies reoccurrence of SVT. Tolerating recent reduction of metoprolol that was made by PCP due to dizziness without worsening symptoms. Continue metoprolol.   Bilateral lower extremity edema: He has bilateral 1+ pitting edema in both legs. LVEF 62-26%, nl diastolic parameters by echo 3/19. He stays busy doing house and yard work. Was advised by PCP to use compression stockings but has not done so yet. In the absence of increased SOB, orthopnea, PND, I do not feel that additional evaluation of heart failure is warranted at this time. Encouraged leg elevation, compression, and low sodium  diet. Encouraged him to call back if swelling or SOB increase.  Disposition: 1 year with Dr. Gwenlyn Found   Medication Adjustments/Labs and Tests Ordered: Current medicines are reviewed at length with the patient today.  Concerns regarding medicines are outlined above.  No orders of the defined types were placed in this encounter.  No orders of the defined types were placed in this encounter.   Patient Instructions  Medication Instructions:  Your physician recommends that you continue on your  current medications as directed. Please refer to the Current Medication list given to you today.  *If you need a refill on your cardiac medications before your next appointment, please call your pharmacy*  Lab Work: NONE ordered at this time of appointment   If you have labs (blood work) drawn today and your tests are completely normal, you will receive your results only by: [place] (if you have MyChart) OR A paper copy in the mail If you have any lab test that is abnormal or we need to change your treatment, we will call you to review the results.  Testing/Procedures: NONE ordered at this time of appointment   Follow-Up: At [facility], you and your health needs are our priority.  As part of our continuing mission to provide you with exceptional heart care, we have created designated Provider Care Teams.  These Care Teams include your primary Cardiologist (physician) and Advanced Practice Providers (APPs -  Physician Assistants and Nurse Practitioners) who all work together to provide you with the care you need, when you need it.  Your next appointment:   1 year(s)  The format for your next appointment:   In Person  Provider:   Quay Burow, MD    Other Instructions     Signed, Emmaline Life, NP  10/03/2021 11:18 AM    [place]

## 2021-10-03 ENCOUNTER — Encounter: Payer: Self-pay | Admitting: Nurse Practitioner

## 2021-10-03 ENCOUNTER — Other Ambulatory Visit: Payer: Self-pay

## 2021-10-03 ENCOUNTER — Ambulatory Visit: Payer: Medicare HMO | Admitting: Nurse Practitioner

## 2021-10-03 VITALS — BP 132/84 | HR 68 | Ht 69.0 in | Wt 243.4 lb

## 2021-10-03 DIAGNOSIS — I471 Supraventricular tachycardia: Secondary | ICD-10-CM

## 2021-10-03 DIAGNOSIS — E782 Mixed hyperlipidemia: Secondary | ICD-10-CM

## 2021-10-03 DIAGNOSIS — R600 Localized edema: Secondary | ICD-10-CM | POA: Diagnosis not present

## 2021-10-03 DIAGNOSIS — I10 Essential (primary) hypertension: Secondary | ICD-10-CM

## 2021-10-03 DIAGNOSIS — I700 Atherosclerosis of aorta: Secondary | ICD-10-CM

## 2021-10-03 NOTE — Patient Instructions (Signed)
Medication Instructions:  Your physician recommends that you continue on your current medications as directed. Please refer to the Current Medication list given to you today.  *If you need a refill on your cardiac medications before your next appointment, please call your pharmacy*  Lab Work: NONE ordered at this time of appointment   If you have labs (blood work) drawn today and your tests are completely normal, you will receive your results only by: [place] (if you have MyChart) OR A paper copy in the mail If you have any lab test that is abnormal or we need to change your treatment, we will call you to review the results.  Testing/Procedures: NONE ordered at this time of appointment   Follow-Up: At [facility], you and your health needs are our priority.  As part of our continuing mission to provide you with exceptional heart care, we have created designated Provider Care Teams.  These Care Teams include your primary Cardiologist (physician) and Advanced Practice Providers (APPs -  Physician Assistants and Nurse Practitioners) who all work together to provide you with the care you need, when you need it.  Your next appointment:   1 year(s)  The format for your next appointment:   In Person  Provider:   Quay Burow, MD    Other Instructions

## 2021-10-12 ENCOUNTER — Other Ambulatory Visit: Payer: Self-pay | Admitting: Family Medicine

## 2021-10-24 ENCOUNTER — Telehealth: Payer: Self-pay | Admitting: Cardiovascular Disease

## 2021-10-24 NOTE — Telephone Encounter (Signed)
Wife states Mr.Labrum went to bathroom at 0430 this am and noted fast heart rate. He took his VS over the next hour and note 143/90-HR 114, 136/103-HR 117, 143/84-HR121  He called EMS an hour later and when medics took EKG, they were told it was "ok". He recalls they mentioned his HR at 68-70 bpm   He feels fine now. Wife admits he is "hooked" on diet [place]. He has tried to reduce drinking so much in past. He verified he take lopressor 50 mg bid.   I will McClureSwinyer, NP

## 2021-10-24 NOTE — Telephone Encounter (Signed)
Pt informed of providers result & recommendations. Pt verbalized understanding. No further questions . She will let pt know. She will call back if this does not work

## 2021-10-24 NOTE — Telephone Encounter (Signed)
Pt c/o BP issue: STAT if pt c/o blurred vision, one-sided weakness or slurred speech  1. What are your last 5 BP readings? 5am 136/103  HR 124  122/72    (paramedics took it)  2. Are you having any other symptoms (ex. Dizziness, headache, blurred vision, passed out)? No symptoms when his BP was high.   3. What is your BP issue? High BP, patient's wife states they called the paramedics.  They did an EKG on him everything was fine.  She stated they type of thing has happened in the past.

## 2021-10-24 NOTE — Telephone Encounter (Signed)
He should reduce his intake of all caffeinated beverages and increase consumption of water. Needs 64 oz total fluids daily, with the majority of that being water. If heart rate remains consistently > 100, call back and let us know. For now, will continue metoprolol 50 mg twice daily.

## 2021-11-06 DIAGNOSIS — E7800 Pure hypercholesterolemia, unspecified: Secondary | ICD-10-CM | POA: Diagnosis not present

## 2021-11-06 DIAGNOSIS — H5200 Hypermetropia, unspecified eye: Secondary | ICD-10-CM | POA: Diagnosis not present

## 2021-11-13 ENCOUNTER — Other Ambulatory Visit: Payer: Self-pay | Admitting: Family Medicine

## 2021-12-02 ENCOUNTER — Other Ambulatory Visit: Payer: Self-pay | Admitting: Family Medicine

## 2021-12-04 ENCOUNTER — Telehealth: Payer: Self-pay | Admitting: Family Medicine

## 2021-12-04 NOTE — Telephone Encounter (Signed)
Spoke  to pt about hernia he has - appt made with [facility] to discuss. He would like referral to Curlene Labrum, at [facility]

## 2021-12-06 ENCOUNTER — Ambulatory Visit (INDEPENDENT_AMBULATORY_CARE_PROVIDER_SITE_OTHER): Payer: Medicare HMO | Admitting: Family Medicine

## 2021-12-06 ENCOUNTER — Encounter: Payer: Self-pay | Admitting: Family Medicine

## 2021-12-06 VITALS — BP 145/78 | HR 81 | Ht 69.0 in | Wt 237.0 lb

## 2021-12-06 DIAGNOSIS — N289 Disorder of kidney and ureter, unspecified: Secondary | ICD-10-CM | POA: Diagnosis not present

## 2021-12-06 DIAGNOSIS — M6208 Separation of muscle (nontraumatic), other site: Secondary | ICD-10-CM

## 2021-12-06 NOTE — Progress Notes (Signed)
BP (!) 145/78    Pulse 81    Ht 5' 9"  (1.753 m)    Wt 237 lb (107.5 kg)    SpO2 97%    BMI 35.00 kg/m    Subjective:   Patient ID: Luis Deleon, male    DOB: [birth year], 81 y.o.   MRN: [id number]  HPI: [name] is a 81 y.o. male presenting on 12/06/2021 for Hernia   HPI Patient is coming in today with some bulging in his upper abdomen and sometimes some pressure.  Especially when he standing up.  He says that it is in his upper abdomen and it bulges out when he is standing up or when he sits up.  It goes back when he lays down.  He denies any significant pain with it but just more discomfort.  He has had multiple hernia surgeries and abdominal surgeries in the past.  Relevant past medical, surgical, family and social history reviewed and updated as indicated. Interim medical history since our last visit reviewed. Allergies and medications reviewed and updated.  Review of Systems  Constitutional:  Negative for chills and fever.  Respiratory:  Negative for shortness of breath and wheezing.   Cardiovascular:  Negative for chest pain and leg swelling.  Gastrointestinal:  Positive for abdominal distention. Negative for abdominal pain, anal bleeding, blood in stool, constipation and diarrhea.  Musculoskeletal:  Negative for back pain and gait problem.  Skin:  Negative for rash.  All other systems reviewed and are negative.  Per HPI unless specifically indicated above   Allergies as of 12/06/2021       Reactions   Colchicine Diarrhea, Nausea And Vomiting, Other (See Comments)   REACTION: nausea, diarrhea and interactions with 60m        Medication List        Accurate as of December 06, 2021  9:38 AM. If you have any questions, ask your nurse or doctor.          allopurinol 100 MG tablet Commonly known as: ZYLOPRIM TAKE 1 TABLET BY MOUTH TWICE A DAY   aspirin 81 MG EC tablet Take 81 mg by mouth daily.   atorvastatin 40 MG tablet Commonly known as:  LIPITOR TAKE 1 TABLET BY MOUTH DAILY AT 6 PM.   baclofen 10 MG tablet Commonly known as: LIORESAL TAKE 1 TABLET BY MOUTH AT BEDTIME AS NEEDED FOR MUSCLE SPASMS.   fluticasone 50 MCG/ACT nasal spray Commonly known as: FLONASE Place 2 sprays into both nostrils daily. What changed:  when to take this reasons to take this   gemfibrozil 600 MG tablet Commonly known as: LOPID Take 1 tablet (600 mg total) by mouth daily. What changed:  when to take this additional instructions   HYDROmorphone 2 MG tablet Commonly known as: Dilaudid Take 1 tablet (2 mg total) by mouth every 4 (four) hours as needed for severe pain.   losartan 25 MG tablet Commonly known as: COZAAR TAKE 1 TABLET (25 MG TOTAL) BY MOUTH DAILY.   metoprolol tartrate 50 MG tablet Commonly known as: LOPRESSOR TAKE 1 TABLET BY MOUTH TWICE A DAY   nystatin-triamcinolone cream Commonly known as: MYCOLOG II Apply 1 application topically 2 (two) times daily. What changed:  when to take this reasons to take this   Omega-3 1000 MG Caps Take 2 capsules by mouth 2 (two) times daily.   potassium chloride SA 20 MEQ tablet Commonly known as: Klor-Con M20 Take 2 tablets (40 mEq total) by mouth  daily.   Vitamin D3 125 MCG (5000 UT) Caps Take 1 capsule by mouth daily.         Objective:   BP (!) 145/78    Pulse 81    Ht 5' 9"  (1.753 m)    Wt 237 lb (107.5 kg)    SpO2 97%    BMI 35.00 kg/m   Wt Readings from Last 3 Encounters:  12/06/21 237 lb (107.5 kg)  10/03/21 243 lb 6.4 oz (110.4 kg)  07/19/21 243 lb (110.2 kg)    Physical Exam Vitals and nursing note reviewed.  Constitutional:      General: He is not in acute distress.    Appearance: He is well-developed. He is not diaphoretic.  Abdominal:     General: Abdomen is flat. There is distension.     Palpations: Abdomen is soft.     Tenderness: There is no abdominal tenderness. There is no guarding or rebound.     Hernia: No hernia is present.     Comments:  Diastases recti, No hernia  Musculoskeletal:        General: Normal range of motion.  Skin:    General: Skin is warm and dry.     Findings: No rash.  Neurological:     Mental Status: He is alert and oriented to person, place, and time.     Coordination: Coordination normal.  Psychiatric:        Behavior: Behavior normal.      Assessment & Plan:   Problem List Items Addressed This Visit   None Visit Diagnoses     Diastasis recti    -  Primary   Renal lesion           Renal lesion was noted on CT abdomen 2 years ago but he thinks that his urologist already took care of it.  He will discuss it with his urologist.  Reassurance that there is no hernia on exam and no hernia on CT scan from 2 years ago. Follow up plan: Return if symptoms worsen or fail to improve.  Counseling provided for all of the vaccine components No orders of the defined types were placed in this encounter.   Caryl Pina, MD [facility] 12/06/2021, 9:38 AM

## 2022-01-15 ENCOUNTER — Encounter: Payer: Self-pay | Admitting: Family Medicine

## 2022-01-15 ENCOUNTER — Ambulatory Visit (INDEPENDENT_AMBULATORY_CARE_PROVIDER_SITE_OTHER): Payer: Medicare HMO | Admitting: Family Medicine

## 2022-01-15 VITALS — BP 135/67 | HR 60 | Temp 98.2°F | Ht 69.0 in | Wt 236.4 lb

## 2022-01-15 DIAGNOSIS — Z0001 Encounter for general adult medical examination with abnormal findings: Secondary | ICD-10-CM

## 2022-01-15 DIAGNOSIS — I10 Essential (primary) hypertension: Secondary | ICD-10-CM | POA: Diagnosis not present

## 2022-01-15 DIAGNOSIS — S161XXA Strain of muscle, fascia and tendon at neck level, initial encounter: Secondary | ICD-10-CM

## 2022-01-15 DIAGNOSIS — M109 Gout, unspecified: Secondary | ICD-10-CM | POA: Diagnosis not present

## 2022-01-15 DIAGNOSIS — Z8546 Personal history of malignant neoplasm of prostate: Secondary | ICD-10-CM | POA: Diagnosis not present

## 2022-01-15 DIAGNOSIS — Z0000 Encounter for general adult medical examination without abnormal findings: Secondary | ICD-10-CM

## 2022-01-15 DIAGNOSIS — E782 Mixed hyperlipidemia: Secondary | ICD-10-CM | POA: Diagnosis not present

## 2022-01-15 MED ORDER — LOSARTAN POTASSIUM 25 MG PO TABS: 25.0000 mg | ORAL_TABLET | Freq: Every day | ORAL | 3 refills | Status: DC

## 2022-01-15 MED ORDER — BACLOFEN 10 MG PO TABS: ORAL_TABLET | ORAL | 3 refills | Status: AC

## 2022-01-15 MED ORDER — ALLOPURINOL 100 MG PO TABS: 100.0000 mg | ORAL_TABLET | Freq: Two times a day (BID) | ORAL | 3 refills | Status: AC

## 2022-01-15 MED ORDER — GEMFIBROZIL 600 MG PO TABS: 600.0000 mg | ORAL_TABLET | ORAL | 3 refills | Status: AC

## 2022-01-15 NOTE — Addendum Note (Signed)
Addended by: Caryl Pina on: 01/15/2022 09:49 AM ? ? Modules accepted: Orders ? ?

## 2022-01-15 NOTE — Progress Notes (Signed)
? ?BP 135/67   Pulse 60   Temp 98.2 ?F (36.8 ?C) (Temporal)   Ht _0  (1.753 m)   Wt 236 lb 6.4 oz (107.2 kg)   SpO2 99%   BMI 34.91 kg/m?   ? ?Subjective:  ? ?Patient ID: Luis Deleon, male    DOB: 1941/07/21, 81 y.o.   MRN: [id number] ? ?HPI: ?WORTH KOBER is a 81 y.o. male presenting on 01/15/2022 for Annual Exam ? ? ?HPI ?Physical exam ?Patient denies any chest pain, shortness of breath, headaches or vision issues, abdominal complaints, diarrhea, nausea, vomiting, or joint issues.  ? ?Hypertension ?Patient is currently on metoprolol and losartan, and their blood pressure today is 135/67. Patient denies any lightheadedness or dizziness. Patient denies headaches, blurred vision, chest pains, shortness of breath, or weakness. Denies any side effects from medication and is content with current medication.  ? ?Hyperlipidemia ?Patient is coming in for recheck of his hyperlipidemia. The patient is currently taking atorvastatin and gemfibrozil. They deny any issues with myalgias or history of liver damage from it. They deny any focal numbness or weakness or chest pain.  ? ?Gout ?Last attack: More than a year ago ?Attacks this year: None ?Medication: Allopurinol ?Location of attacks: Right foot ? ?Relevant past medical, surgical, family and social history reviewed and updated as indicated. Interim medical history since our last visit reviewed. ?Allergies and medications reviewed and updated. ? ?Review of Systems  ?Constitutional:  Negative for chills and fever.  ?HENT:  Negative for ear pain and tinnitus.   ?Eyes:  Negative for pain and visual disturbance.  ?Respiratory:  Negative for cough, shortness of breath and wheezing.   ?Cardiovascular:  Negative for chest pain, palpitations and leg swelling.  ?Gastrointestinal:  Negative for abdominal pain, blood in stool, constipation and diarrhea.  ?Genitourinary:  Negative for dysuria and hematuria.  ?Musculoskeletal:  Negative for back pain, gait problem and  myalgias.  ?Skin:  Negative for rash.  ?Neurological:  Negative for dizziness, weakness and headaches.  ?Psychiatric/Behavioral:  Negative for suicidal ideas.   ?All other systems reviewed and are negative. ? ?Per HPI unless specifically indicated above ? ? ?Allergies as of 01/15/2022   ? ?   Reactions  ? Colchicine Diarrhea, Nausea And Vomiting, Other (See Comments)  ? REACTION: nausea, diarrhea and interactions with 70m  ? ?  ? ?  ?Medication List  ?  ? ?  ? Accurate as of January 15, 2022  9:44 AM. If you have any questions, ask your nurse or doctor.  ?  ?  ? ?  ? ?allopurinol 100 MG tablet ?Commonly known as: ZYLOPRIM ?Take 1 tablet (100 mg total) by mouth 2 (two) times daily. ?  ?aspirin 81 MG EC tablet ?Take 81 mg by mouth daily. ?  ?atorvastatin 40 MG tablet ?Commonly known as: LIPITOR ?TAKE 1 TABLET BY MOUTH DAILY AT 6 PM. ?  ?baclofen 10 MG tablet ?Commonly known as: LIORESAL ?TAKE 1 TABLET BY MOUTH AT BEDTIME AS NEEDED FOR MUSCLE SPASMS. ?  ?fluticasone 50 MCG/ACT nasal spray ?Commonly known as: FLONASE ?Place 2 sprays into both nostrils daily. ?What changed:  ?when to take this ?reasons to take this ?  ?gemfibrozil 600 MG tablet ?Commonly known as: LOPID ?Take 1 tablet (600 mg total) by mouth 3 (three) times a week. Monday , Wednesday, Friday ?  ?HYDROmorphone 2 MG tablet ?Commonly known as: Dilaudid ?Take 1 tablet (2 mg total) by mouth every 4 (four) hours as needed for severe  pain. ?  ?losartan 25 MG tablet ?Commonly known as: COZAAR ?Take 1 tablet (25 mg total) by mouth daily. ?  ?metoprolol tartrate 50 MG tablet ?Commonly known as: LOPRESSOR ?TAKE 1 TABLET BY MOUTH TWICE A DAY ?  ?nystatin-triamcinolone cream ?Commonly known as: MYCOLOG II ?Apply 1 application topically 2 (two) times daily. ?What changed:  ?when to take this ?reasons to take this ?  ?Omega-3 1000 MG Caps ?Take 2 capsules by mouth 2 (two) times daily. ?  ?potassium chloride SA 20 MEQ tablet ?Commonly known as: Klor-Con M20 ?Take 2 tablets  (40 mEq total) by mouth daily. ?  ?Vitamin D3 125 MCG (5000 UT) Caps ?Take 1 capsule by mouth daily. ?  ? ?  ? ? ? ?Objective:  ? ?BP 135/67   Pulse 60   Temp 98.2 ?F (36.8 ?C) (Temporal)   Ht _0  (1.753 m)   Wt 236 lb 6.4 oz (107.2 kg)   SpO2 99%   BMI 34.91 kg/m?   ?Wt Readings from Last 3 Encounters:  ?01/15/22 236 lb 6.4 oz (107.2 kg)  ?12/06/21 237 lb (107.5 kg)  ?10/03/21 243 lb 6.4 oz (110.4 kg)  ?  ?Physical Exam ?Vitals reviewed.  ?Constitutional:   ?   General: He is not in acute distress. ?   Appearance: He is well-developed. He is not diaphoretic.  ?HENT:  ?   Right Ear: External ear normal.  ?   Left Ear: External ear normal.  ?   Nose: Nose normal.  ?   Mouth/Throat:  ?   Pharynx: No oropharyngeal exudate.  ?Eyes:  ?   General: No scleral icterus. ?   Conjunctiva/sclera: Conjunctivae normal.  ?   Pupils: Pupils are equal, round, and reactive to light.  ?Neck:  ?   Thyroid: No thyromegaly.  ?Cardiovascular:  ?   Rate and Rhythm: Normal rate and regular rhythm.  ?   Heart sounds: Normal heart sounds. No murmur heard. ?Pulmonary:  ?   Effort: Pulmonary effort is normal. No respiratory distress.  ?   Breath sounds: Normal breath sounds. No wheezing.  ?Abdominal:  ?   General: Bowel sounds are normal. There is no distension.  ?   Palpations: Abdomen is soft.  ?   Tenderness: There is no abdominal tenderness. There is no right CVA tenderness, left CVA tenderness, guarding or rebound.  ?   Hernia: No hernia is present.  ?Musculoskeletal:     ?   General: No swelling. Normal range of motion.  ?   Cervical back: Neck supple.  ?Lymphadenopathy:  ?   Cervical: No cervical adenopathy.  ?Skin: ?   General: Skin is warm and dry.  ?   Findings: No rash.  ?Neurological:  ?   Mental Status: He is alert and oriented to person, place, and time.  ?   Coordination: Coordination normal.  ?Psychiatric:     ?   Behavior: Behavior normal.  ? ? ? ? ?Assessment & Plan:  ? ?Problem List Items Addressed This Visit   ? ?  ?  Cardiovascular and Mediastinum  ? HTN (hypertension)  ? Relevant Medications  ? losartan (COZAAR) 25 MG tablet  ? gemfibrozil (LOPID) 600 MG tablet  ? Other Relevant Orders  ? CMP14+EGFR  ?  ? Other  ? Hyperlipidemia  ? Relevant Medications  ? losartan (COZAAR) 25 MG tablet  ? gemfibrozil (LOPID) 600 MG tablet  ? Other Relevant Orders  ? Lipid panel  ? Gout, unspecified  ?  Relevant Orders  ? CBC with Differential/Platelet  ? History of prostate cancer  ? Relevant Orders  ? PSA, total and free  ? ?Other Visit Diagnoses   ? ? Physical exam    -  Primary  ? Relevant Orders  ? CBC with Differential/Platelet  ? CMP14+EGFR  ? Lipid panel  ? Strain of neck muscle, initial encounter      ? Relevant Medications  ? baclofen (LIORESAL) 10 MG tablet  ? ?  ?  ?Seems to be doing very well, blood pressure looks good, continue current medicine.  We will check blood work today. ?Follow up plan: ?Return in about 6 months (around 07/17/2022), or if symptoms worsen or fail to improve, for Hypertension and cholesterol recheck. ? ?Counseling provided for all of the vaccine components ?Orders Placed This Encounter  ?Procedures  ? CBC with Differential/Platelet  ? CMP14+EGFR  ? Lipid panel  ? PSA, total and free  ? ? ?Caryl Pina, MD ?[place] ?01/15/2022, 9:44 AM ? ? ? ? ?

## 2022-01-22 ENCOUNTER — Ambulatory Visit: Payer: Medicare HMO

## 2022-01-22 DIAGNOSIS — Z8546 Personal history of malignant neoplasm of prostate: Secondary | ICD-10-CM | POA: Diagnosis not present

## 2022-01-22 DIAGNOSIS — M109 Gout, unspecified: Secondary | ICD-10-CM

## 2022-01-22 DIAGNOSIS — I10 Essential (primary) hypertension: Secondary | ICD-10-CM

## 2022-01-22 DIAGNOSIS — Z0000 Encounter for general adult medical examination without abnormal findings: Secondary | ICD-10-CM

## 2022-01-22 DIAGNOSIS — E782 Mixed hyperlipidemia: Secondary | ICD-10-CM

## 2022-01-23 LAB — CBC WITH DIFFERENTIAL/PLATELET
Basophils Absolute: 0 10*3/uL (ref 0.0–0.2)
Basos: 1 %
EOS (ABSOLUTE): 0.2 10*3/uL (ref 0.0–0.4)
Eos: 3 %
Hematocrit: 51.7 % — ABNORMAL HIGH (ref 37.5–51.0)
Hemoglobin: 17.1 g/dL (ref 13.0–17.7)
Immature Grans (Abs): 0 10*3/uL (ref 0.0–0.1)
Immature Granulocytes: 0 %
Lymphocytes Absolute: 1.6 10*3/uL (ref 0.7–3.1)
Lymphs: 20 %
MCH: 31.4 pg (ref 26.6–33.0)
MCHC: 33.1 g/dL (ref 31.5–35.7)
MCV: 95 fL (ref 79–97)
Monocytes Absolute: 0.8 10*3/uL (ref 0.1–0.9)
Monocytes: 10 %
Neutrophils Absolute: 5 10*3/uL (ref 1.4–7.0)
Neutrophils: 66 %
Platelets: 158 10*3/uL (ref 150–450)
RBC: 5.45 x10E6/uL (ref 4.14–5.80)
RDW: 14.2 % (ref 11.6–15.4)
WBC: 7.6 10*3/uL (ref 3.4–10.8)

## 2022-01-23 LAB — CMP14+EGFR
ALT: 30 IU/L (ref 0–44)
AST: 33 IU/L (ref 0–40)
Albumin/Globulin Ratio: 1.5 (ref 1.2–2.2)
Albumin: 3.8 g/dL (ref 3.7–4.7)
Alkaline Phosphatase: 65 IU/L (ref 44–121)
BUN/Creatinine Ratio: 11 (ref 10–24)
BUN: 13 mg/dL (ref 8–27)
Bilirubin Total: 2.5 mg/dL — ABNORMAL HIGH (ref 0.0–1.2)
CO2: 24 mmol/L (ref 20–29)
Calcium: 9.9 mg/dL (ref 8.6–10.2)
Chloride: 106 mmol/L (ref 96–106)
Creatinine, Ser: 1.17 mg/dL (ref 0.76–1.27)
Globulin, Total: 2.5 g/dL (ref 1.5–4.5)
Glucose: 95 mg/dL (ref 70–99)
Potassium: 4.1 mmol/L (ref 3.5–5.2)
Sodium: 143 mmol/L (ref 134–144)
Total Protein: 6.3 g/dL (ref 6.0–8.5)
eGFR: 64 mL/min/{1.73_m2} (ref >59)

## 2022-01-23 LAB — LIPID PANEL
Chol/HDL Ratio: 3.3 ratio (ref 0.0–5.0)
Cholesterol, Total: 111 mg/dL (ref 100–199)
HDL: 34 mg/dL — ABNORMAL LOW (ref >39)
LDL Chol Calc (NIH): 52 mg/dL (ref 0–99)
Triglycerides: 140 mg/dL (ref 0–149)
VLDL Cholesterol Cal: 25 mg/dL (ref 5–40)

## 2022-01-23 LAB — PSA, TOTAL AND FREE
PSA, Free: <0.02 ng/mL
Prostate Specific Ag, Serum: <0.1 ng/mL (ref 0.0–4.0)

## 2022-02-13 ENCOUNTER — Ambulatory Visit: Payer: Medicare HMO

## 2022-02-22 ENCOUNTER — Other Ambulatory Visit: Payer: Self-pay | Admitting: Family Medicine

## 2022-02-27 ENCOUNTER — Telehealth: Payer: Self-pay | Admitting: Cardiovascular Disease

## 2022-02-27 ENCOUNTER — Telehealth: Payer: Self-pay | Admitting: Family Medicine

## 2022-02-27 NOTE — Telephone Encounter (Signed)
Left message for patient to call back and schedule Medicare Annual Wellness Visit (AWV) to be completed by video or phone.  ? ?Last AWV:  02/10/2021 ? ?Please schedule at anytime with [place] ? ?45 minute appointment ? ?Any questions, please contact me at [phone]  ?  ?  ?

## 2022-02-27 NOTE — Telephone Encounter (Signed)
Pt c/o of Chest Pain: STAT if CP now or developed within 24 hours ? ?1. Are you having CP right now? No  ? ?2. Are you experiencing any other symptoms (ex. SOB, nausea, vomiting, sweating)? Occasional lightheadedness but it occurs separately from the chest tightness  ? ?3. How long have you been experiencing CP? Since Sunday ? ?4. Is your CP continuous or coming and going? Comes and goes. Primarily at Night  ? ?5. Have you taken Nitroglycerin? No ? ?STAT if patient feels like he/she is going to faint  ? ?Are you dizzy now? no ? ?Do you feel faint or have you passed out? no ? ?Do you have any other symptoms? See above  ? ?Have you checked your HR and BP (record if available)?  ? ~120/78 Sunday, but patient states that is normal for him  ?Patient states it is more of a chest tightness that normally occurs at night  ??  ?

## 2022-02-27 NOTE — Telephone Encounter (Signed)
Spoke to patient he stated he has been having chest tightness,dull ache in center of chest off and on since this past Sat.No chest tightness at present.Appointment scheduled with Dr.Berry 5/19 at 3:15 pm.Advised if he has any more chest tightness go to ED. ?

## 2022-03-02 ENCOUNTER — Ambulatory Visit: Payer: Medicare HMO | Admitting: Cardiovascular Disease

## 2022-03-02 ENCOUNTER — Encounter: Payer: Self-pay | Admitting: Cardiovascular Disease

## 2022-03-02 VITALS — BP 135/76 | HR 59 | Ht 68.0 in | Wt 236.2 lb

## 2022-03-02 DIAGNOSIS — E782 Mixed hyperlipidemia: Secondary | ICD-10-CM | POA: Diagnosis not present

## 2022-03-02 DIAGNOSIS — I471 Supraventricular tachycardia: Secondary | ICD-10-CM

## 2022-03-02 DIAGNOSIS — I10 Essential (primary) hypertension: Secondary | ICD-10-CM | POA: Diagnosis not present

## 2022-03-02 DIAGNOSIS — R600 Localized edema: Secondary | ICD-10-CM | POA: Diagnosis not present

## 2022-03-02 DIAGNOSIS — R0789 Other chest pain: Secondary | ICD-10-CM

## 2022-03-02 DIAGNOSIS — R072 Precordial pain: Secondary | ICD-10-CM

## 2022-03-02 MED ORDER — HYDROCHLOROTHIAZIDE 12.5 MG PO TABS: 12.5000 mg | ORAL_TABLET | Freq: Every day | ORAL | 3 refills | Status: DC

## 2022-03-02 NOTE — Assessment & Plan Note (Signed)
History of essential hypertension a blood pressure measured today at 135/76.  He is on losartan and metoprolol.  He was on a diuretic in the past.

## 2022-03-02 NOTE — Assessment & Plan Note (Signed)
History of hyperlipidemia on statin therapy with lipid profile performed/10/23 revealing total cholesterol of 111, LDL 52 and HDL 34.

## 2022-03-02 NOTE — Assessment & Plan Note (Signed)
1 year history of bilateral lower extremity edema.  He was on a diuretic in the past but no longer.  2D echo performed in 2019 was normal.  I am going to start him back on his hydrochlorothiazide 12.5 mg a day and we will check a basic metabolic panel in 7 to 10 days.  We will recheck a 2D echocardiogram.

## 2022-03-02 NOTE — Assessment & Plan Note (Addendum)
Luis Deleon has related several episodes of atypical chest pain and of lasting hours at a time.  He does have positive risk factors.  I am going to get a coronary CTA to further evaluate.  He did have a negative Myoview stress test performed 01/14/2018.

## 2022-03-02 NOTE — Progress Notes (Signed)
03/02/2022 Luis Deleon   Mar 08, 1941  [id number]  Primary Physician Dettinger, Fransisca Kaufmann, MD Primary Cardiologist: Luis Harp MD Luis Deleon, [place]  HPI:  Luis Deleon is a 81 y.o.  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  He is accompanied by his daughter Luis Deleon today.  He is the sole caretaker for his wife who has chronic illnesses.  He was referred by Dr. Morrie Deleon, his primary care physician, for cardiovascular evaluation because of a recent episode of PSVT.   I last saw him in the office 05/20/2020.  He has a history of treated hypertension and hyperlipidemia. He's never had a heart attack or stroke. He smoked remotely having stopped 25 years ago. He does have a history of Crohn's disease and prostate cancer status post radical prostatectomy remotely by Dr. Jeffie Deleon at which time he did have an episode of PSVT.he was admitted to [facility] on 12/19/17 with what appears to be PSVT which broke. His rate was 160. He had positive below troponins. A 2-D echo was normal.   Since I saw him a year ago he is remained stable.  He has complained of some chest and epigastric pain every 3 to 4 months lasting for a few minutes usually an hour after eating.  He said no episodes of PSVT.  He also has developed bilateral lower extremity edema over the last year.  He was on a diuretic which was discontinued by his PCP.   Current Meds  Medication Sig   allopurinol (ZYLOPRIM) 100 MG tablet Take 1 tablet (100 mg total) by mouth 2 (two) times daily.   aspirin 81 MG EC tablet Take 81 mg by mouth daily.   atorvastatin (LIPITOR) 40 MG tablet TAKE 1 TABLET BY MOUTH DAILY AT 6 PM.   baclofen (LIORESAL) 10 MG tablet TAKE 1 TABLET BY MOUTH AT BEDTIME AS NEEDED FOR MUSCLE SPASMS.   Cholecalciferol (VITAMIN D3) 125 MCG (5000 UT) CAPS Take 1 capsule by mouth daily.    fluticasone (FLONASE) 50 MCG/ACT nasal spray Place 2 sprays into both nostrils daily. (Patient taking differently: Place 2 sprays into both nostrils daily as needed.)   gemfibrozil (LOPID) 600 MG tablet Take 1 tablet (600 mg total) by mouth 3 (three) times a week. Monday , Wednesday, Friday   HYDROmorphone (DILAUDID) 2 MG tablet Take 1 tablet (2 mg total) by mouth every 4 (four) hours as needed for severe pain.   losartan (COZAAR) 25 MG tablet Take 1 tablet (25 mg total) by mouth daily.   metoprolol tartrate (LOPRESSOR) 50 MG tablet TAKE 1 TABLET BY MOUTH TWICE A DAY   nystatin-triamcinolone (MYCOLOG II) cream Apply 1 application topically 2 (two) times daily. (Patient taking differently: Apply 1 application. topically 2 (two) times daily as needed.)   OMEGA-3 1000 MG CAPS Take 2 capsules by mouth 2 (two) times daily.   potassium chloride SA (KLOR-CON M20) 20 MEQ tablet Take 2 tablets (40 mEq total) by mouth daily.     Allergies  Allergen Reactions   Colchicine Diarrhea, Nausea And Vomiting and Other (See Comments)    REACTION: nausea, diarrhea and interactions with 2m    Social History   Socioeconomic History   Marital status: Married    Spouse name: Luis Deleon  Number of children: 1   Years of education: 12   Highest education  level: 12th grade  Occupational History   Occupation: Retired     Comment: Event organiser  Tobacco Use   Smoking status: Former    Packs/day: 1.00    Years: 38.00    Pack years: 38.00    Types: Cigarettes    Quit date: 03/19/1991    Years since quitting: 30.9   Smokeless tobacco: Current    Types: Chew  Vaping Use   Vaping Use: Never used  Substance and Sexual Activity   Alcohol use: No   Drug use: No   Sexual activity: Never  Other Topics Concern   Not on file  Social History Narrative   Lives with wife. Daughter lives in [place] Determinants of Health   Financial Resource Strain: Not on file  Food Insecurity: Not on file   Transportation Needs: Not on file  Physical Activity: Not on file  Stress: Not on file  Social Connections: Not on file  Intimate Partner Violence: Not on file     Review of Systems: General: negative for chills, fever, night sweats or weight changes.  Cardiovascular: negative for chest pain, dyspnea on exertion, edema, orthopnea, palpitations, paroxysmal nocturnal dyspnea or shortness of breath Dermatological: negative for rash Respiratory: negative for cough or wheezing Urologic: negative for hematuria Abdominal: negative for nausea, vomiting, diarrhea, bright red blood per rectum, melena, or hematemesis Neurologic: negative for visual changes, syncope, or dizziness All other systems reviewed and are otherwise negative except as noted above.    Blood pressure 135/76, pulse (!) 59, height 5' 8"  (1.727 m), weight 236 lb 3.2 oz (107.1 kg), SpO2 96 %.  General appearance: alert and no distress Neck: no adenopathy, no carotid bruit, no JVD, supple, symmetrical, trachea midline, and thyroid not enlarged, symmetric, no tenderness/mass/nodules Lungs: clear to auscultation bilaterally Heart: regular rate and rhythm, S1, S2 normal, no murmur, click, rub or gallop Extremities: extremities normal, atraumatic, no cyanosis or edema Pulses: 2+ and symmetric Skin: Skin color, texture, turgor normal. No rashes or lesions Neurologic: Grossly normal  EKG sinus bradycardia 59 without ST or T wave changes.  Personally reviewed this EKG.  ASSESSMENT AND PLAN:   Hyperlipidemia History of hyperlipidemia on statin therapy with lipid profile performed/10/23 revealing total cholesterol of 111, LDL 52 and HDL 34.  HTN (hypertension) History of essential hypertension a blood pressure measured today at 135/76.  He is on losartan and metoprolol.  He was on a diuretic in the past.  PSVT (paroxysmal supraventricular tachycardia) (HCC) History of PSVT in the past although he has had no significant episodes  since I saw him a year ago.  Atypical chest pain [name] has related several episodes of atypical chest pain and of lasting hours at a time.  He does have positive risk factors.  I am going to get a coronary CTA to further evaluate.  He did have a negative Myoview stress test performed 01/14/2018.  Bilateral lower extremity edema 1 year history of bilateral lower extremity edema.  He was on a diuretic in the past but no longer.  2D echo performed in 2019 was normal.  I am going to start him back on his hydrochlorothiazide 12.5 mg a day and we will check a basic metabolic panel in 7 to 10 days.  We will recheck a 2D echocardiogram.     Luis Harp MD [facility], [facility] 03/02/2022 4:14 PM

## 2022-03-02 NOTE — Patient Instructions (Addendum)
Medication Instructions:   -Start taking hydrochlorothiazide (hydrodiuil) 12.26m once daily.  *If you need a refill on your cardiac medications before your next appointment, please call your pharmacy*   Lab Work: Your physician recommends that you return for lab work in: 7-10 days for BMET  If you have labs (blood work) drawn today and your tests are completely normal, you will receive your results only by: MLucas(if you have MyChart) OR A paper copy in the mail If you have any lab test that is abnormal or we need to change your treatment, we will call you to review the results.   Testing/Procedures: Your physician has requested that you have an echocardiogram. Echocardiography is a painless test that uses sound waves to create images of your heart. It provides your doctor with information about the size and shape of your heart and how well your heart's chambers and valves are working. This procedure takes approximately one hour. There are no restrictions for this procedure. This procedure will be done at [id number]   Follow-Up: [facility] our [facility].  As part of our continuing mission to provide you with exceptional heart care, we have created designated Provider Care Teams.  These Care Teams include your primary Cardiologist (physician) and Advanced Practice Providers (APPs -  Physician Assistants and Nurse Practitioners) who all work together to provide you with the care you need, when you need it.  We recommend signing up for the patient portal called "MyChart".  Sign up information is provided on this After Visit Summary.  MyChart is used to connect with patients for Virtual Visits (Telemedicine).  Patients are able to view lab/test results, encounter notes, upcoming appointments, etc.  Non-urgent messages can be sent to your provider as well.   To learn more about what you can do with MyChart, go to  [email or web address]    Your next appointment:   3 month(s)  The format for your next appointment:   In Person  Provider:   JColetta Memos FNP, AFabian Sharp PA-C, CSande Rives PA-C, JCaron Presume PA-C, KJory Sims DNP, ANP, HAlmyra Deforest PA-C, or EDiona Browner NP       Then, JQuay Burow MD will plan to see you again in 12 month(s).   Other Instructions   Your cardiac CT will be scheduled at the below location:   [facility] [place] [phone]  [facility] please [facility] the [facility] ([facility] minutes prior to test start time. You can use the FREE valet parking offered at entrance C (encouraged to control the heart rate for the test)  Pro[facility] the [facility] (first floor) to check-in and test prep.  All radiology patients and guests should use entrance C2 at [facility] even though the hospital's physical address listed is [address]     Please follow these instructions carefully (unless otherwise directed):  Hold all erectile dysfunction medications at least 3 days (72 hrs) prior to test.  On the Night Before the Test: Be sure to Drink plenty of water. Do not consume any caffeinated/decaffeinated beverages or chocolate 12 hours prior to your test. Do not take any antihistamines 12 hours prior to your test.  On the Day of the Test: Drink plenty of water until 1 hour  prior to the test. Do not eat any food 4 hours prior to the test. You may take your regular medications prior to the test.  Take metoprolol (Lopressor) two hours prior to test. HOLD Furosemide/Hydrochlorothiazide morning of the test.       After the Test: Drink plenty of water. After receiving IV contrast, you may experience a mild flushed feeling. This is normal. On  occasion, you may experience a mild rash up to 24 hours after the test. This is not dangerous. If this occurs, you can take Benadryl 25 mg and increase your fluid intake. If you experience trouble breathing, this can be serious. If it is severe call 911 IMMEDIATELY. If it is mild, please call our office. If you take any of these medications: Glipizide/Metformin, Avandament, Glucavance, please do not take 48 hours after completing test unless otherwise instructed.  We will call to schedule your test 2-4 weeks out understanding that some insurance companies will need an authorization prior to the service being performed.   For non-scheduling related questions, please contact the cardiac imaging nurse navigator should you have any questions/concerns: Marchia Bond, Cardiac Imaging Nurse Navigator Gordy Clement, Cardiac Imaging Nurse Navigator [facility] Direct Office Dial: [phone]   For scheduling needs, including cancellations and rescheduling, please call [place], [phone].

## 2022-03-02 NOTE — Assessment & Plan Note (Signed)
History of PSVT in the past although he has had no significant episodes since I saw him a year ago.

## 2022-03-09 ENCOUNTER — Other Ambulatory Visit: Payer: Medicare HMO

## 2022-03-09 DIAGNOSIS — I10 Essential (primary) hypertension: Secondary | ICD-10-CM | POA: Diagnosis not present

## 2022-03-09 DIAGNOSIS — R600 Localized edema: Secondary | ICD-10-CM | POA: Diagnosis not present

## 2022-03-09 DIAGNOSIS — R0789 Other chest pain: Secondary | ICD-10-CM | POA: Diagnosis not present

## 2022-03-09 DIAGNOSIS — I471 Supraventricular tachycardia: Secondary | ICD-10-CM | POA: Diagnosis not present

## 2022-03-09 DIAGNOSIS — R072 Precordial pain: Secondary | ICD-10-CM | POA: Diagnosis not present

## 2022-03-09 DIAGNOSIS — E782 Mixed hyperlipidemia: Secondary | ICD-10-CM | POA: Diagnosis not present

## 2022-03-10 LAB — BASIC METABOLIC PANEL
BUN/Creatinine Ratio: 11 (ref 10–24)
BUN: 14 mg/dL (ref 8–27)
CO2: 23 mmol/L (ref 20–29)
Calcium: 9.8 mg/dL (ref 8.6–10.2)
Chloride: 101 mmol/L (ref 96–106)
Creatinine, Ser: 1.24 mg/dL (ref 0.76–1.27)
Glucose: 100 mg/dL — ABNORMAL HIGH (ref 70–99)
Potassium: 4.1 mmol/L (ref 3.5–5.2)
Sodium: 140 mmol/L (ref 134–144)
eGFR: 60 mL/min/{1.73_m2} (ref >59)

## 2022-03-27 ENCOUNTER — Ambulatory Visit (HOSPITAL_COMMUNITY): Payer: Medicare HMO | Attending: Cardiology

## 2022-03-27 DIAGNOSIS — I10 Essential (primary) hypertension: Secondary | ICD-10-CM | POA: Insufficient documentation

## 2022-03-27 DIAGNOSIS — R0789 Other chest pain: Secondary | ICD-10-CM | POA: Diagnosis not present

## 2022-03-27 DIAGNOSIS — R072 Precordial pain: Secondary | ICD-10-CM

## 2022-03-27 DIAGNOSIS — I471 Supraventricular tachycardia: Secondary | ICD-10-CM | POA: Insufficient documentation

## 2022-03-27 DIAGNOSIS — R600 Localized edema: Secondary | ICD-10-CM | POA: Diagnosis not present

## 2022-03-27 DIAGNOSIS — E782 Mixed hyperlipidemia: Secondary | ICD-10-CM | POA: Diagnosis not present

## 2022-03-27 LAB — ECHOCARDIOGRAM COMPLETE
Area-P 1/2: 2.73 cm2
S' Lateral: 3.1 cm

## 2022-04-05 ENCOUNTER — Telehealth (HOSPITAL_COMMUNITY): Payer: Self-pay | Admitting: *Deleted

## 2022-04-05 ENCOUNTER — Telehealth: Payer: Self-pay | Admitting: Family Medicine

## 2022-04-05 NOTE — Telephone Encounter (Signed)
Reaching out to patient to offer assistance regarding upcoming cardiac imaging study; pt verbalizes understanding of appt date/time, parking situation and where to check in, pre-test NPO status and verified current allergies; name and call back number provided for further questions should they arise  Gordy Clement RN Navigator Cardiac [place] [facility] [phone] office [phone] cell  Patient to take his daily metoprolol two hours prior to his CT scan. He is aware to arrive at 8am.

## 2022-04-05 NOTE — Telephone Encounter (Signed)
Patient asked that he call back to schedule.  Stated that he is going through a lot right now and does not want to complete AWV until finished with testing

## 2022-04-06 ENCOUNTER — Ambulatory Visit (HOSPITAL_COMMUNITY)
Admission: RE | Admit: 2022-04-06 | Discharge: 2022-04-06 | Disposition: A | Discharge status: Home / Self Care | Payer: Medicare HMO | Source: Ambulatory Visit | Attending: Cardiovascular Disease | Admitting: Cardiovascular Disease

## 2022-04-06 ENCOUNTER — Other Ambulatory Visit: Payer: Self-pay | Admitting: Cardiovascular Disease

## 2022-04-06 ENCOUNTER — Ambulatory Visit (HOSPITAL_BASED_OUTPATIENT_CLINIC_OR_DEPARTMENT_OTHER)
Admission: RE | Admit: 2022-04-06 | Discharge: 2022-04-06 | Disposition: A | Discharge status: Home / Self Care | Payer: Medicare HMO | Source: Ambulatory Visit | Attending: Cardiovascular Disease | Admitting: Cardiovascular Disease

## 2022-04-06 DIAGNOSIS — R931 Abnormal findings on diagnostic imaging of heart and coronary circulation: Secondary | ICD-10-CM | POA: Diagnosis not present

## 2022-04-06 DIAGNOSIS — I2510 Atherosclerotic heart disease of native coronary artery without angina pectoris: Secondary | ICD-10-CM

## 2022-04-06 DIAGNOSIS — R072 Precordial pain: Secondary | ICD-10-CM | POA: Insufficient documentation

## 2022-04-06 MED ORDER — IOHEXOL 350 MG/ML SOLN
100.0000 mL | Freq: Once | INTRAVENOUS | Status: AC | PRN
  Administered 2022-04-06: 100 mL via INTRAVENOUS

## 2022-04-06 MED ORDER — NITROGLYCERIN 0.4 MG SL SUBL
SUBLINGUAL_TABLET | SUBLINGUAL | Status: AC
  Filled 2022-04-06: qty 2

## 2022-04-06 MED ORDER — NITROGLYCERIN 0.4 MG SL SUBL
0.8000 mg | SUBLINGUAL_TABLET | Freq: Once | SUBLINGUAL | Status: AC
  Administered 2022-04-06: 0.8 mg via SUBLINGUAL

## 2022-04-10 ENCOUNTER — Ambulatory Visit: Payer: Medicare HMO | Admitting: Cardiovascular Disease

## 2022-04-10 ENCOUNTER — Encounter: Payer: Self-pay | Admitting: Cardiovascular Disease

## 2022-04-10 DIAGNOSIS — R0789 Other chest pain: Secondary | ICD-10-CM | POA: Diagnosis not present

## 2022-04-10 NOTE — Progress Notes (Signed)
04/10/2022 Luis Deleon   1941-09-02  [id number]  Primary Physician Dettinger, Fransisca Kaufmann, MD Primary Cardiologist: Lorretta Harp MD Lupe Carney, [place]  HPI:  Luis Deleon is a 81 y.o.  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  He is accompanied by his daughter [name] today.  He is the sole caretaker for his wife who has chronic illnesses.  He was referred by Dr. Morrie Sheldon, his primary care physician, for cardiovascular evaluation because of a recent episode of PSVT.   I last saw him in the office 03/02/2022.  He has a history of treated hypertension and hyperlipidemia. He's never had a heart attack or stroke. He smoked remotely having stopped 25 years ago. He does have a history of Crohn's disease and prostate cancer status post radical prostatectomy remotely by Dr. Jeffie Pollock at which time he did have an episode of PSVT.he was admitted to [facility] on 12/19/17 with what appears to be PSVT which broke. His rate was 160. He had positive below troponins. A 2-D echo was normal.   He has complained of some chest and epigastric pain every 3 to 4 months lasting for a few minutes usually an hour after eating.  He said no episodes of PSVT.  He also has developed bilateral lower extremity edema over the last year.  He was on a diuretic which was discontinued by his PCP.  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  Based on this, we have decided to proceed with outpatient diagnostic coronary angiography.   Current Meds  Medication Sig   allopurinol (ZYLOPRIM) 100 MG tablet Take 1 tablet (100 mg total) by mouth 2 (two) times daily.   aspirin 81 MG EC tablet Take 81 mg by mouth daily.   atorvastatin (LIPITOR) 40 MG  tablet TAKE 1 TABLET BY MOUTH DAILY AT 6 PM.   baclofen (LIORESAL) 10 MG tablet TAKE 1 TABLET BY MOUTH AT BEDTIME AS NEEDED FOR MUSCLE SPASMS.   Cholecalciferol (VITAMIN D3) 125 MCG (5000 UT) CAPS Take 1 capsule by mouth daily.   fluticasone (FLONASE) 50 MCG/ACT nasal spray Place 2 sprays into both nostrils daily. (Patient taking differently: Place 2 sprays into both nostrils daily as needed.)   gemfibrozil (LOPID) 600 MG tablet Take 1 tablet (600 mg total) by mouth 3 (three) times a week. Monday , Wednesday, Friday   hydrochlorothiazide (HYDRODIURIL) 12.5 MG tablet Take 1 tablet (12.5 mg total) by mouth daily.   HYDROmorphone (DILAUDID) 2 MG tablet Take 1 tablet (2 mg total) by mouth every 4 (four) hours as needed for severe pain.   losartan (COZAAR) 25 MG tablet Take 1 tablet (25 mg total) by mouth daily.   metoprolol tartrate (LOPRESSOR) 50 MG tablet TAKE 1 TABLET BY MOUTH TWICE A DAY   nystatin-triamcinolone (MYCOLOG II) cream Apply 1 application topically 2 (two) times daily. (Patient taking differently: Apply 1 application  topically 2 (two) times daily as needed.)   OMEGA-3 1000 MG CAPS Take 2 capsules by mouth 2 (two) times daily.   potassium chloride SA (KLOR-CON M20) 20 MEQ tablet Take 2 tablets (40 mEq total) by mouth daily.     Allergies  Allergen Reactions   Colchicine Diarrhea, Nausea And Vomiting and  Other (See Comments)    REACTION: nausea, diarrhea and interactions with 73m    Social History   Socioeconomic History   Marital status: Married    Spouse name: [name]  Number of children: 1   Years of education: 12   Highest education level: 12th grade  Occupational History   Occupation: Retired     Comment: LEvent organiser Tobacco Use   Smoking status: Former    Packs/day: 1.00    Years: 38.00    Total pack years: 38.00    Types: Cigarettes    Quit date: 03/19/1991    Years since quitting: 31.0   Smokeless tobacco: Current    Types: Chew  Vaping Use   Vaping  Use: Never used  Substance and Sexual Activity   Alcohol use: No   Drug use: No   Sexual activity: Never  Other Topics Concern   Not on file  Social History Narrative   Lives with wife. [place]  (02/10/2021)   Overall Financial Resource Strain (CARDIA)    Difficulty of Paying Living Expenses: Not hard at all  Food Insecurity: No Food Insecurity (02/10/2021)   Hunger Vital Sign    Worried About Running Out of Food in the Last Year: Never true    Ran Out of Food in the Last Year: Never true  Transportation Needs: No Transportation Needs (02/10/2021)   PRAPARE - THydrologist(Medical): No    Lack of Transportation (Non-Medical): No  Physical Activity: Inactive (02/10/2021)   Exercise Vital Sign    Days of Exercise per Week: 0 days    Minutes of Exercise per Session: 0 min  Stress: No Stress Concern Present (02/10/2021)   FLewis   Feeling of Stress : Not at all  Social Connections: Moderately Isolated (02/10/2021)   Social Connection and Isolation Panel [NHANES]    Frequency of Communication with Friends and Family: More than three times a week    Frequency of Social Gatherings with Friends and Family: Three times a week    Attends Religious Services: Never    Active Member of Clubs or Organizations: No    Attends CArchivistMeetings: Never    Marital Status: Married  IHuman resources officerViolence: Not At Risk (02/10/2021)   Humiliation, Afraid, Rape, and Kick questionnaire    Fear of Current or Ex-Partner: No    Emotionally Abused: No    Physically Abused: No    Sexually Abused: No     Review of Systems: General: negative for chills, fever, night sweats or weight changes.  Cardiovascular: negative for chest pain, dyspnea on exertion, edema, orthopnea, palpitations, paroxysmal nocturnal  dyspnea or shortness of breath Dermatological: negative for rash Respiratory: negative for cough or wheezing Urologic: negative for hematuria Abdominal: negative for nausea, vomiting, diarrhea, bright red blood per rectum, melena, or hematemesis Neurologic: negative for visual changes, syncope, or dizziness All other systems reviewed and are otherwise negative except as noted above.    Blood pressure (!) 116/58, pulse 61, height 5' 8"  (1.727 m), weight 236 lb (107 kg).  General appearance: alert and no distress Neck: no adenopathy, no carotid bruit, no JVD, supple, symmetrical, trachea midline, and thyroid not enlarged, symmetric, no tenderness/mass/nodules Lungs: clear to auscultation bilaterally Heart: regular rate and rhythm, S1, S2 normal, no murmur, click, rub or gallop  Extremities: extremities normal, atraumatic, no cyanosis or edema Pulses: 2+ and symmetric Skin: Skin color, texture, turgor normal. No rashes or lesions Neurologic: Grossly normal  EKG sinus rhythm at 61 without ST or T wave changes.  I personally reviewed this EKG.  ASSESSMENT AND PLAN:   Atypical chest pain Mr. Sparr returns today for for follow-up of his outpatient diagnostic test performed because of shortness of breath and chest pain.  His 2D echo was essentially normal.  His coronary CTA performed 04/06/2022 revealed at least two-vessel disease read by Dr. Audie Box.  His coronary calcium score was 3693.  Based on this, we decided to proceed with outpatient radial diagnostic coronary angiography to define his anatomy.   I have reviewed the risks, indications, and alternatives to cardiac catheterization, possible angioplasty, and stenting with the patient. Risks include but are not limited to bleeding, infection, vascular injury, stroke, myocardial infection, arrhythmia, kidney injury, radiation-related injury in the case of prolonged fluoroscopy use, emergency cardiac surgery, and death. The patient understands the  risks of serious complication is 1-2 in 9480 with diagnostic cardiac cath and 1-2% or less with angioplasty/stenting.      Lorretta Harp MD FACP,FACC,FAHA, [facility] 04/10/2022 9:20 AM

## 2022-04-10 NOTE — H&P (View-Only) (Signed)
04/10/2022 Luis Deleon   1941-09-02  [id number]  Primary Physician Dettinger, Fransisca Kaufmann, MD Primary Cardiologist: Lorretta Harp MD Luis Deleon, [place]  HPI:  Luis Deleon is a 81 y.o.  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  He is accompanied by his daughter Luis Deleon today.  He is the sole caretaker for his wife who has chronic illnesses.  He was referred by Dr. Morrie Sheldon, his primary care physician, for cardiovascular evaluation because of a recent episode of PSVT.   I last saw him in the office 03/02/2022.  He has a history of treated hypertension and hyperlipidemia. He's never had a heart attack or stroke. He smoked remotely having stopped 25 years ago. He does have a history of Crohn's disease and prostate cancer status post radical prostatectomy remotely by Dr. Jeffie Pollock at which time he did have an episode of PSVT.he was admitted to [facility] on 12/19/17 with what appears to be PSVT which broke. His rate was 160. He had positive below troponins. A 2-D echo was normal.   He has complained of some chest and epigastric pain every 3 to 4 months lasting for a few minutes usually an hour after eating.  He said no episodes of PSVT.  He also has developed bilateral lower extremity edema over the last year.  He was on a diuretic which was discontinued by his PCP.  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  Based on this, we have decided to proceed with outpatient diagnostic coronary angiography.   Current Meds  Medication Sig   allopurinol (ZYLOPRIM) 100 MG tablet Take 1 tablet (100 mg total) by mouth 2 (two) times daily.   aspirin 81 MG EC tablet Take 81 mg by mouth daily.   atorvastatin (LIPITOR) 40 MG  tablet TAKE 1 TABLET BY MOUTH DAILY AT 6 PM.   baclofen (LIORESAL) 10 MG tablet TAKE 1 TABLET BY MOUTH AT BEDTIME AS NEEDED FOR MUSCLE SPASMS.   Cholecalciferol (VITAMIN D3) 125 MCG (5000 UT) CAPS Take 1 capsule by mouth daily.   fluticasone (FLONASE) 50 MCG/ACT nasal spray Place 2 sprays into both nostrils daily. (Patient taking differently: Place 2 sprays into both nostrils daily as needed.)   gemfibrozil (LOPID) 600 MG tablet Take 1 tablet (600 mg total) by mouth 3 (three) times a week. Monday , Wednesday, Friday   hydrochlorothiazide (HYDRODIURIL) 12.5 MG tablet Take 1 tablet (12.5 mg total) by mouth daily.   HYDROmorphone (DILAUDID) 2 MG tablet Take 1 tablet (2 mg total) by mouth every 4 (four) hours as needed for severe pain.   losartan (COZAAR) 25 MG tablet Take 1 tablet (25 mg total) by mouth daily.   metoprolol tartrate (LOPRESSOR) 50 MG tablet TAKE 1 TABLET BY MOUTH TWICE A DAY   nystatin-triamcinolone (MYCOLOG II) cream Apply 1 application topically 2 (two) times daily. (Patient taking differently: Apply 1 application  topically 2 (two) times daily as needed.)   OMEGA-3 1000 MG CAPS Take 2 capsules by mouth 2 (two) times daily.   potassium chloride SA (KLOR-CON M20) 20 MEQ tablet Take 2 tablets (40 mEq total) by mouth daily.     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 [place]  (02/10/2021[place]   Overall Financial Resource Strain (CARDIA)    Difficulty of Paying Living Expenses: Not hard at all  Food Insecurity: No Food Insecurity (02/10/2021)   Hunger Vital Sign    Worried About Running Out of Food in the Last Year: Never true    Ran Out of Food in the Last Year: Never true  Transportation Needs: No Transportation Needs (02/10/2021)   PRAPARE - THydrologist(Medical): No    Lack of Transportation (Non-Medical): No  Physical Activity: Inactive (02/10/2021)   Exercise Vital Sign    Days of Exercise per Week: 0 days    Minutes of Exercise per Session: 0 min  Stress: No Stress Concern Present (02/10/2021)   FLewis   Feeling of Stress : Not at all  Social Connections: Moderately Isolated (02/10/2021)   Social Connection and Isolation Panel [NHANES]    Frequency of Communication with Friends and Family: More than three times a week    Frequency of Social Gatherings with Friends and Family: Three times a week    Attends Religious Services: Never    Active Member of Clubs or Organizations: No    Attends CArchivistMeetings: Never    Marital Status: Married  IHuman resources officerViolence: Not At Risk (02/10/2021)   Humiliation, Afraid, Rape, and Kick questionnaire    Fear of Current or Ex-Partner: No    Emotionally Abused: No    Physically Abused: No    Sexually Abused: No     Review of Systems: General: negative for chills, fever, night sweats or weight changes.  Cardiovascular: negative for chest pain, dyspnea on exertion, edema, orthopnea, palpitations, paroxysmal nocturnal  dyspnea or shortness of breath Dermatological: negative for rash Respiratory: negative for cough or wheezing Urologic: negative for hematuria Abdominal: negative for nausea, vomiting, diarrhea, bright red blood per rectum, melena, or hematemesis Neurologic: negative for visual changes, syncope, or dizziness All other systems reviewed and are otherwise negative except as noted above.    Blood pressure (!) 116/58, pulse 61, height 5' 8"  (1.727 m), weight 236 lb (107 kg).  General appearance: alert and no distress Neck: no adenopathy, no carotid bruit, no JVD, supple, symmetrical, trachea midline, and thyroid not enlarged, symmetric, no tenderness/mass/nodules Lungs: clear to auscultation bilaterally Heart: regular rate and rhythm, S1, S2 normal, no murmur, click, rub or gallop  Extremities: extremities normal, atraumatic, no cyanosis or edema Pulses: 2+ and symmetric Skin: Skin color, texture, turgor normal. No rashes or lesions Neurologic: Grossly normal  EKG sinus rhythm at 61 without ST or T wave changes.  I personally reviewed this EKG.  ASSESSMENT AND PLAN:   Atypical chest pain Mr. Sparr returns today for for follow-up of his outpatient diagnostic test performed because of shortness of breath and chest pain.  His 2D echo was essentially normal.  His coronary CTA performed 04/06/2022 revealed at least two-vessel disease read by Dr. Audie Box.  His coronary calcium score was 3693.  Based on this, we decided to proceed with outpatient radial diagnostic coronary angiography to define his anatomy.   I have reviewed the risks, indications, and alternatives to cardiac catheterization, possible angioplasty, and stenting with the patient. Risks include but are not limited to bleeding, infection, vascular injury, stroke, myocardial infection, arrhythmia, kidney injury, radiation-related injury in the case of prolonged fluoroscopy use, emergency cardiac surgery, and death. The patient understands the  risks of serious complication is 1-2 in 9480 with diagnostic cardiac cath and 1-2% or less with angioplasty/stenting.      Lorretta Harp MD FACP,FACC,FAHA, [facility] 04/10/2022 9:20 AM

## 2022-04-11 ENCOUNTER — Other Ambulatory Visit: Payer: Self-pay

## 2022-04-11 DIAGNOSIS — R931 Abnormal findings on diagnostic imaging of heart and coronary circulation: Secondary | ICD-10-CM

## 2022-04-11 LAB — CBC
Hematocrit: 53.1 % — ABNORMAL HIGH (ref 37.5–51.0)
Hemoglobin: 17.7 g/dL (ref 13.0–17.7)
MCH: 31.8 pg (ref 26.6–33.0)
MCHC: 33.3 g/dL (ref 31.5–35.7)
MCV: 96 fL (ref 79–97)
Platelets: 142 10*3/uL — ABNORMAL LOW (ref 150–450)
RBC: 5.56 x10E6/uL (ref 4.14–5.80)
RDW: 14.3 % (ref 11.6–15.4)
WBC: 7.9 10*3/uL (ref 3.4–10.8)

## 2022-04-11 LAB — BASIC METABOLIC PANEL
BUN/Creatinine Ratio: 13 (ref 10–24)
BUN: 18 mg/dL (ref 8–27)
CO2: 24 mmol/L (ref 20–29)
Calcium: 9.4 mg/dL (ref 8.6–10.2)
Chloride: 105 mmol/L (ref 96–106)
Creatinine, Ser: 1.35 mg/dL — ABNORMAL HIGH (ref 0.76–1.27)
Glucose: 70 mg/dL (ref 70–99)
Potassium: 4.1 mmol/L (ref 3.5–5.2)
Sodium: 143 mmol/L (ref 134–144)
eGFR: 54 mL/min/{1.73_m2} — ABNORMAL LOW (ref >59)

## 2022-04-11 MED ORDER — SODIUM CHLORIDE 0.9% FLUSH: 3.0000 mL | Freq: Two times a day (BID) | INTRAVENOUS | Status: DC

## 2022-04-12 ENCOUNTER — Telehealth: Payer: Self-pay | Admitting: *Deleted

## 2022-04-12 NOTE — Telephone Encounter (Signed)
Cardiac Catheterization scheduled at [facility] for: Monday April 16, 2022 7:30 AM Arrival time and place: [place] Entrance A at: 5:30 AM   Nothing to eat after midnight prior to procedure, clear liquids until 5 AM day of procedure.  Medication instructions: -Hold:  Losartan/HCTZ/KCl-day before and day of procedure -per protocol -GFR 54 -Except hold medications usual morning medications can be taken with sips of water including aspirin 81 mg.  Confirmed patient has responsible adult to drive home post procedure and be with patient first 24 hours after arriving home.  Patient reports no new symptoms concerning for COVID-19 in the past 10 days.  Reviewed procedure instructions with patient.

## 2022-04-16 ENCOUNTER — Encounter (HOSPITAL_COMMUNITY): Payer: Self-pay | Admitting: Cardiovascular Disease

## 2022-04-16 ENCOUNTER — Telehealth: Payer: Self-pay | Admitting: Cardiovascular Disease

## 2022-04-16 ENCOUNTER — Other Ambulatory Visit: Payer: Self-pay

## 2022-04-16 ENCOUNTER — Encounter (HOSPITAL_COMMUNITY)
Admission: RE | Disposition: A | Discharge status: Home / Self Care | Payer: Self-pay | Source: Home / Self Care | Attending: Cardiovascular Disease

## 2022-04-16 ENCOUNTER — Ambulatory Visit (HOSPITAL_COMMUNITY)
Admission: RE | Admit: 2022-04-16 | Discharge: 2022-04-16 | Disposition: A | Discharge status: Home / Self Care | Payer: Medicare HMO | Attending: Cardiovascular Disease | Admitting: Cardiovascular Disease

## 2022-04-16 DIAGNOSIS — Z87891 Personal history of nicotine dependence: Secondary | ICD-10-CM | POA: Diagnosis not present

## 2022-04-16 DIAGNOSIS — Z8546 Personal history of malignant neoplasm of prostate: Secondary | ICD-10-CM | POA: Insufficient documentation

## 2022-04-16 DIAGNOSIS — R0789 Other chest pain: Secondary | ICD-10-CM | POA: Diagnosis not present

## 2022-04-16 DIAGNOSIS — I2584 Coronary atherosclerosis due to calcified coronary lesion: Secondary | ICD-10-CM | POA: Diagnosis not present

## 2022-04-16 DIAGNOSIS — K5090 Crohn's disease, unspecified, without complications: Secondary | ICD-10-CM | POA: Diagnosis not present

## 2022-04-16 DIAGNOSIS — E785 Hyperlipidemia, unspecified: Secondary | ICD-10-CM | POA: Diagnosis not present

## 2022-04-16 DIAGNOSIS — I10 Essential (primary) hypertension: Secondary | ICD-10-CM | POA: Diagnosis not present

## 2022-04-16 DIAGNOSIS — I2510 Atherosclerotic heart disease of native coronary artery without angina pectoris: Secondary | ICD-10-CM | POA: Diagnosis not present

## 2022-04-16 DIAGNOSIS — R931 Abnormal findings on diagnostic imaging of heart and coronary circulation: Secondary | ICD-10-CM

## 2022-04-16 DIAGNOSIS — Z9079 Acquired absence of other genital organ(s): Secondary | ICD-10-CM | POA: Insufficient documentation

## 2022-04-16 HISTORY — PX: LEFT HEART CATH AND CORONARY ANGIOGRAPHY: CATH118249

## 2022-04-16 SURGERY — LEFT HEART CATH AND CORONARY ANGIOGRAPHY
Anesthesia: LOCAL

## 2022-04-16 MED ORDER — HEPARIN (PORCINE) IN NACL 1000-0.9 UT/500ML-% IV SOLN
INTRAVENOUS | Status: DC | PRN
  Administered 2022-04-16 (×2): 500 mL

## 2022-04-16 MED ORDER — ASPIRIN 81 MG PO CHEW: 81.0000 mg | CHEWABLE_TABLET | ORAL | Status: DC

## 2022-04-16 MED ORDER — VERAPAMIL HCL 2.5 MG/ML IV SOLN
INTRA_ARTERIAL | Status: DC | PRN
  Administered 2022-04-16: 20 mL via INTRA_ARTERIAL

## 2022-04-16 MED ORDER — ASPIRIN 81 MG PO CHEW: 81.0000 mg | CHEWABLE_TABLET | Freq: Every day | ORAL | Status: DC

## 2022-04-16 MED ORDER — SODIUM CHLORIDE 0.9 % IV SOLN: 250.0000 mL | INTRAVENOUS | Status: DC | PRN

## 2022-04-16 MED ORDER — HYDRALAZINE HCL 20 MG/ML IJ SOLN: 10.0000 mg | INTRAMUSCULAR | Status: DC | PRN

## 2022-04-16 MED ORDER — SODIUM CHLORIDE 0.9% FLUSH: 3.0000 mL | INTRAVENOUS | Status: DC | PRN

## 2022-04-16 MED ORDER — ACETAMINOPHEN 325 MG PO TABS: 650.0000 mg | ORAL_TABLET | ORAL | Status: DC | PRN

## 2022-04-16 MED ORDER — HEPARIN SODIUM (PORCINE) 1000 UNIT/ML IJ SOLN
INTRAMUSCULAR | Status: DC | PRN
  Administered 2022-04-16: 5000 [IU] via INTRAVENOUS

## 2022-04-16 MED ORDER — SODIUM CHLORIDE 0.9 % WEIGHT BASED INFUSION: 1.0000 mL/kg/h | INTRAVENOUS | Status: DC

## 2022-04-16 MED ORDER — IOHEXOL 350 MG/ML SOLN
INTRAVENOUS | Status: DC | PRN
  Administered 2022-04-16: 45 mL

## 2022-04-16 MED ORDER — ONDANSETRON HCL 4 MG/2ML IJ SOLN: 4.0000 mg | Freq: Four times a day (QID) | INTRAMUSCULAR | Status: DC | PRN

## 2022-04-16 MED ORDER — LABETALOL HCL 5 MG/ML IV SOLN: 10.0000 mg | INTRAVENOUS | Status: DC | PRN

## 2022-04-16 MED ORDER — FENTANYL CITRATE (PF) 100 MCG/2ML IJ SOLN
INTRAMUSCULAR | Status: DC | PRN
  Administered 2022-04-16: 25 ug via INTRAVENOUS

## 2022-04-16 MED ORDER — HEPARIN SODIUM (PORCINE) 1000 UNIT/ML IJ SOLN
INTRAMUSCULAR | Status: AC
  Filled 2022-04-16: qty 10

## 2022-04-16 MED ORDER — SODIUM CHLORIDE 0.9 % WEIGHT BASED INFUSION
3.0000 mL/kg/h | INTRAVENOUS | Status: AC
  Administered 2022-04-16: 3 mL/kg/h via INTRAVENOUS

## 2022-04-16 MED ORDER — NITROGLYCERIN 1 MG/10 ML FOR IR/CATH LAB
INTRA_ARTERIAL | Status: AC
Start: 2022-04-16 — End: ?
  Filled 2022-04-16: qty 10

## 2022-04-16 MED ORDER — VERAPAMIL HCL 2.5 MG/ML IV SOLN
INTRAVENOUS | Status: AC
  Filled 2022-04-16: qty 2

## 2022-04-16 MED ORDER — FENTANYL CITRATE (PF) 100 MCG/2ML IJ SOLN
INTRAMUSCULAR | Status: AC
  Filled 2022-04-16: qty 2

## 2022-04-16 MED ORDER — MIDAZOLAM HCL 2 MG/2ML IJ SOLN
INTRAMUSCULAR | Status: AC
  Filled 2022-04-16: qty 2

## 2022-04-16 MED ORDER — MIDAZOLAM HCL 2 MG/2ML IJ SOLN
INTRAMUSCULAR | Status: DC | PRN
  Administered 2022-04-16: 1 mg via INTRAVENOUS

## 2022-04-16 MED ORDER — HEPARIN (PORCINE) IN NACL 1000-0.9 UT/500ML-% IV SOLN
INTRAVENOUS | Status: AC
  Filled 2022-04-16: qty 1000

## 2022-04-16 MED ORDER — LIDOCAINE HCL (PF) 1 % IJ SOLN
INTRAMUSCULAR | Status: AC
  Filled 2022-04-16: qty 30

## 2022-04-16 MED ORDER — LIDOCAINE HCL (PF) 1 % IJ SOLN
INTRAMUSCULAR | Status: DC | PRN
  Administered 2022-04-16: 2 mL

## 2022-04-16 MED ORDER — SODIUM CHLORIDE 0.9 % IV SOLN: INTRAVENOUS | Status: DC

## 2022-04-16 MED ORDER — MORPHINE SULFATE (PF) 2 MG/ML IV SOLN: 2.0000 mg | INTRAVENOUS | Status: DC | PRN

## 2022-04-16 MED ORDER — SODIUM CHLORIDE 0.9% FLUSH: 3.0000 mL | Freq: Two times a day (BID) | INTRAVENOUS | Status: DC

## 2022-04-16 SURGICAL SUPPLY — 11 items
BAND ZEPHYR COMPRESS 30 LONG (HEMOSTASIS) ×1 IMPLANT
CATH OPTITORQUE TIG 4.0 5F (CATHETERS) ×1 IMPLANT
GLIDESHEATH SLEND A-KIT 6F 22G (SHEATH) ×1 IMPLANT
GUIDEWIRE INQWIRE 1.5J.035X260 (WIRE) IMPLANT
INQWIRE 1.5J .035X260CM (WIRE) ×2
KIT HEART LEFT (KITS) ×2 IMPLANT
PACK CARDIAC CATHETERIZATION (CUSTOM PROCEDURE TRAY) ×2 IMPLANT
SYR MEDRAD MARK 7 150ML (SYRINGE) ×2 IMPLANT
TRANSDUCER W/STOPCOCK (MISCELLANEOUS) ×2 IMPLANT
TUBING CIL FLEX 10 FLL-RA (TUBING) ×2 IMPLANT
WIRE HI TORQ VERSACORE-J 145CM (WIRE) ×1 IMPLANT

## 2022-04-16 NOTE — Telephone Encounter (Signed)
New Message:     Daughter called and said the surgeon's office had called today to schedule the patient's consultation. for heart surgery on 05-04-22. She wanted to check with Dr Gwenlyn Found to see if he thought that was too long to wait? She said she thought it sounded today like it was really urgent.Marland Kitchen

## 2022-04-16 NOTE — Progress Notes (Signed)
     I have notified [facility] that pt needs outpt appt for CABG.  They will call him at home.

## 2022-04-16 NOTE — Interval H&P Note (Signed)
Cath Lab Visit (complete for each Cath Lab visit)  Clinical Evaluation Leading to the Procedure:   ACS: No.  Non-ACS:    Anginal Classification: CCS I  Anti-ischemic medical therapy: Minimal Therapy (1 class of medications)  Non-Invasive Test Results: No non-invasive testing performed  Prior CABG: No previous CABG      History and Physical Interval Note:  04/16/2022 7:34 AM  Luis Deleon  has presented today for surgery, with the diagnosis of cad.  The various methods of treatment have been discussed with the patient and family. After consideration of risks, benefits and other options for treatment, the patient has consented to  Procedure(s): LEFT HEART CATH AND CORONARY ANGIOGRAPHY (N/A) as a surgical intervention.  The patient's history has been reviewed, patient examined, no change in status, stable for surgery.  I have reviewed the patient's chart and labs.  Questions were answered to the patient's satisfaction.     Quay Burow

## 2022-04-16 NOTE — Discharge Instructions (Signed)
The cardiothoracic team will call you with date and time of appt.   If you have not heard in several days their number is [phone]

## 2022-04-16 NOTE — Telephone Encounter (Signed)
Spoke with pt's daughter regarding sooner appointment with [facility]. Explained that I spoke with Dr. Gwenlyn Found and he suggests that appointment with [facility] should be sooner. Per Dr. Gwenlyn Found, will reach out to [facility] for pt to have sooner appointment. Relayed this message to daughter. Daughter verbalizes understanding.

## 2022-04-20 ENCOUNTER — Ambulatory Visit: Payer: Medicare HMO | Admitting: Cardiovascular Disease

## 2022-04-23 ENCOUNTER — Other Ambulatory Visit: Payer: Self-pay | Admitting: *Deleted

## 2022-04-23 ENCOUNTER — Encounter: Payer: Self-pay | Admitting: Thoracic Surgery (Cardiothoracic Vascular Surgery)

## 2022-04-23 ENCOUNTER — Encounter: Payer: Self-pay | Admitting: *Deleted

## 2022-04-23 ENCOUNTER — Institutional Professional Consult (permissible substitution): Payer: Medicare HMO | Admitting: Thoracic Surgery (Cardiothoracic Vascular Surgery)

## 2022-04-23 VITALS — BP 157/88 | HR 63 | Resp 20 | Ht 68.0 in | Wt 235.0 lb

## 2022-04-23 DIAGNOSIS — I25118 Atherosclerotic heart disease of native coronary artery with other forms of angina pectoris: Secondary | ICD-10-CM | POA: Diagnosis not present

## 2022-04-23 DIAGNOSIS — I25119 Atherosclerotic heart disease of native coronary artery with unspecified angina pectoris: Secondary | ICD-10-CM | POA: Diagnosis not present

## 2022-04-23 DIAGNOSIS — I2510 Atherosclerotic heart disease of native coronary artery without angina pectoris: Secondary | ICD-10-CM | POA: Insufficient documentation

## 2022-04-23 NOTE — H&P (View-Only) (Signed)
PCP is Dettinger, Fransisca Kaufmann, MD Referring Provider is Lorretta Harp, MD  Chief Complaint  Patient presents with   Coronary Artery Disease    New patient consultation CATH 7/3, ECHO 6/13    HPI: Luis Deleon is sent for consultation regarding two-vessel coronary disease.  Luis Deleon is a 81 year old man with a past history significant for hypertension, hyperlipidemia, Crohn's disease, multiple abdominal surgeries, prostate cancer with radical prostatectomy, stage III chronic kidney disease, remote tobacco abuse, and paroxysmal supraventricular tachycardia.  His primary complaint is palpitations.  He has been having those for a couple of years.  He describes as his heart beating very strongly.  His heart rate would usually be around 90 if he checked it.  On multiple occasions EMS was called and EKG did not show any ischemia.  It would usually last about an hour or 2 and then would resolve.  He was having that with increased frequency.  He had an event monitor for 30 days and did not have an episode.  He had a recurrent episode the day after the event monitor was removed.  He says he is not having any chest pain, pressure, or tightness.  He does get short of breath with exertion.  Also complains of dizziness particularly in the mornings and when he bends over and then stands up quickly.  Dr. Gwenlyn Found did a coronary CT which showed a coronary calcium score of greater than 3000.  He then underwent cardiac catheterization which revealed severe two-vessel coronary disease.  History of Crohn's disease with intermittent abdominal pain and prior surgeries.  About once every 4 to 5 months he will have to take Dilaudid for pain.  Does have swelling in his legs.  Worsened after he stopped hydrochlorothiazide.  That medication has been resumed.   Past Medical History:  Diagnosis Date   Arthritis    Balanoposthitis    CKD (chronic kidney disease), stage III ([place])    Crohn disease ([place])    followed by dr  Hilarie Fredrickson--- dx 1984, terminal ileum-- prolonged clinical remission since bowel resection 2003   Diverticulosis    Edema of both lower extremities    Erythema    penis   Fatty liver    followed by dr Hilarie Fredrickson   Gout    12-08-2020  per pt last episode approx. 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 Julien Nordmann,  lov in epic 01-28-2013  hx phlebotomy and donates blood   PSVT (paroxysmal supraventricular tachycardia) [facility])    cardiologist--- dr Gwenlyn Found ; episdoe  at time of prostatectomy 2006 and recurrent 03/ 2019;  event monitor 01-15-2018 showed NSR,  echo 12-20-2017 with ef 60-65% mild LVH mild TR   Renal cyst, acquired    urologist-- dr Junious Silk   Vitamin D deficiency     Past Surgical History:  Procedure Laterality Date   BACTERIAL OVERGROWTH TEST N/A 06/15/2016   Procedure: BACTERIAL OVERGROWTH TEST;  Surgeon: Md Phys[facility], MD;  Location: [facility];  Service: Gastroenterology;  Laterality: N/A;   COLONOSCOPY WITH PROPOFOL  last one 06-24-2019  dr pyrtle   CYSTOSCOPY  WITH URETHRAL DORSAL FORSKIN INCISION  10-10-2018  @[facility]    EXPLORATORY LAPAROTOMY W/ BOWEL RESECTION  05-22-2002   @[facility]    distal ileum and cecum / appendectomy     LAPAROSCOPIC ASSISTED VENTRAL HERNIA REPAIR  05-12-2003  @[facility]    LAPAROSCOPY EXTENSIVE LYSIS ADHESIONS AND VENTRAL HERNIA REPAIR  02-11-2008  @[facility]    LEFT HEART CATH AND CORONARY ANGIOGRAPHY N/A 04/16/2022   Procedure: LEFT HEART CATH AND CORONARY ANGIOGRAPHY;  Surgeon: Lorretta Harp, MD;  Location: [facility];  Service: Cardiovascular;  Laterality: N/A;   LITHOTRIPSY  1980   PENILE BIOPSY N/A 12/13/2020   Procedure: PENILE AND FORESKIN BIOPSY;  Surgeon: Festus Aloe, MD;  Location: [facility];  Service: Urology;  Laterality: N/A;   RETROPUBIC PROSTATECTOMY  11-16-2004   @[facility]    TONSILLECTOMY  age 89    Family History  Problem Relation Age of Onset   Heart disease Mother    Cervical cancer Mother    Heart disease Father    Heart failure Father    Dislocations Daughter 8       Bilateral knee   Heart disease Maternal Uncle    Colon cancer Neg Hx    Esophageal cancer Neg Hx    Rectal cancer Neg Hx    Stomach cancer Neg Hx    Colon polyps Neg Hx     Social History Social History   Tobacco Use   Smoking status: Former    Packs/day: 1.00    Years: 38.00    Total pack years: 38.00    Types: Cigarettes    Quit date: 03/19/1991    Years since quitting: 31.1   Smokeless tobacco: Current    Types: Chew  Vaping Use   Vaping Use: Never used  Substance Use Topics   Alcohol use: No   Drug use: No    Current Outpatient Medications  Medication Sig Dispense Refill   allopurinol (ZYLOPRIM) 100 MG tablet Take 1 tablet (100 mg total) by mouth 2 (two) times daily. 180 tablet 3   aspirin 81 MG EC tablet Take 81 mg by mouth every evening.     atorvastatin (LIPITOR) 40 MG tablet TAKE 1 TABLET BY MOUTH DAILY AT 6 PM. 90 tablet 3   baclofen (LIORESAL) 10 MG tablet TAKE 1 TABLET BY MOUTH AT BEDTIME AS NEEDED FOR MUSCLE SPASMS. 90 tablet 3   Cholecalciferol (VITAMIN D3) 125 MCG (5000 UT) CAPS Take 1 capsule by mouth See  admin instructions. Take 1 tablet by mouth every day except on Sundays.     gemfibrozil (LOPID) 600 MG tablet Take 1 tablet (600 mg total) by mouth 3 (three) times a week. Monday , Wednesday, Friday (Patient taking differently: Take 600 mg by mouth every 3 (three) days.) 39 tablet 3   hydrochlorothiazide (HYDRODIURIL) 12.5 MG tablet Take 1 tablet (12.5 mg total) by mouth daily. 90 tablet 3   HYDROmorphone (DILAUDID) 2 MG  tablet Take 2 mg by mouth daily as needed for severe pain.     losartan (COZAAR) 25 MG tablet Take 1 tablet (25 mg total) by mouth daily. 90 tablet 3   metoprolol tartrate (LOPRESSOR) 50 MG tablet TAKE 1 TABLET BY MOUTH TWICE A DAY 180 tablet 3   nystatin-triamcinolone (MYCOLOG II) cream Apply 1 application topically 2 (two) times daily. (Patient taking differently: Apply 1 application  topically 2 (two) times daily as needed.) 30 g 3   OMEGA-3 1000 MG CAPS Take 2 capsules by mouth 2 (two) times daily.     potassium chloride SA (KLOR-CON M20) 20 MEQ tablet Take 2 tablets (40 mEq total) by mouth daily. 180 tablet 3   Current Facility-Administered Medications  Medication Dose Route Frequency Provider Last Rate Last Admin   sodium chloride flush (NS) 0.9 % injection 3 mL  3 mL Intravenous Q12H Lorretta Harp, MD        Allergies  Allergen Reactions   Colchicine Diarrhea, Nausea And Vomiting and Other (See Comments)    REACTION: nausea, diarrhea and interactions with 21m    Review of Systems  Constitutional:  Positive for activity change and fatigue.  HENT:  Positive for dental problem (Dentures) and hearing loss. Negative for trouble swallowing and voice change.   Eyes:  Negative for visual disturbance.  Respiratory:  Positive for shortness of breath.   Cardiovascular:  Positive for palpitations and leg swelling. Negative for chest pain.  Gastrointestinal:  Positive for abdominal pain and diarrhea.  Genitourinary:  Positive for frequency. Negative for dysuria.  Skin:   Positive for rash.       Itching  Neurological:  Positive for dizziness. Negative for seizures and weakness.       Memory problems  Hematological:  Bruises/bleeds easily.  All other systems reviewed and are negative.   BP (!) 157/88 (BP Location: Left Arm, Patient Position: Sitting, Cuff Size: Normal)   Pulse 63   Resp 20   Ht 5' 8"  (1.727 m)   Wt 235 lb (106.6 kg)   SpO2 94% Comment: RA  BMI 35.73 kg/m  Physical Exam Vitals reviewed.  Constitutional:      General: He is not in acute distress.    Appearance: He is obese.  HENT:     Head: Normocephalic and atraumatic.  Eyes:     General: No scleral icterus.    Extraocular Movements: Extraocular movements intact.  Neck:     Vascular: No carotid bruit.  Cardiovascular:     Rate and Rhythm: Normal rate and regular rhythm.     Heart sounds: Normal heart sounds. No murmur heard.    No friction rub. No gallop.  Pulmonary:     Effort: Pulmonary effort is normal. No respiratory distress.     Breath sounds: Normal breath sounds. No wheezing or rales.  Abdominal:     General: There is distension.     Palpations: Abdomen is soft.  Musculoskeletal:     Cervical back: Neck supple.     Right lower leg: Edema (1+) present.     Left lower leg: Edema (1+) present.  Skin:    General: Skin is warm and dry.  Neurological:     General: No focal deficit present.     Mental Status: He is alert and oriented to person, place, and time.     Cranial Nerves: No cranial nerve deficit.     Motor: No weakness.     Diagnostic Tests: Echocardiogram 03/27/2022 IMPRESSIONS  1. Left ventricular ejection fraction, by estimation, is 60 to 65%. The  left ventricle has normal function. The left ventricle has no regional  wall motion abnormalities. Left ventricular diastolic parameters were  normal. The average left ventricular  global longitudinal strain is -24.8 %. The global longitudinal strain is  normal.   2. Right ventricular systolic  function is normal. The right ventricular  size is normal. There is normal pulmonary artery systolic pressure. The  estimated right ventricular systolic pressure is 81.8 mmHg.   3. The mitral valve is normal in structure. Trivial mitral valve  regurgitation. No evidence of mitral stenosis.   4. The aortic valve is tricuspid. There is mild calcification of the  aortic valve. There is mild thickening of the aortic valve. Aortic valve  regurgitation is not visualized. Aortic valve sclerosis is present, with  no evidence of aortic valve stenosis.   5. The inferior vena cava is dilated in size with >50% respiratory  variability, suggesting right atrial pressure of 8 mmHg.   Cardiac catheterization 04/16/2022 Conclusion      Prox RCA lesion is 99% stenosed.   Prox RCA to Mid RCA lesion is 99% stenosed.   Mid RCA to Dist RCA lesion is 99% stenosed.   Dist RCA lesion is 99% stenosed.   Prox LAD to Mid LAD lesion is 95% stenosed.   1st Diag lesion is 95% stenosed. Diagnostic Dominance: Right  I personally reviewed the catheterization images.  There is a tight lesion in the proximal LAD at the takeoff of the first diagonal.  Also severe diffuse disease in the right coronary.  Impression: Luis Deleon is a 81 year old man with a past history significant for hypertension, hyperlipidemia, Crohn's disease, multiple abdominal surgeries, prostate cancer with radical prostatectomy, stage III chronic kidney disease, remote tobacco abuse, and paroxysmal supraventricular tachycardia.   He gives a history of very unusual symptoms, primarily just palpitations.  I have seen in other notes complaints of chest tightness or pressure but he denies that to me.  In any event, his symptoms led to a CT for coronary calcium scoring which showed two-vessel disease and a calcium score of greater than 3000.  That led to cardiac catheterization which revealed severe two-vessel coronary disease.  Ejection fraction is 60 to  65% by echocardiogram and there is no significant valvular disease.  Coronary bypass grafting is indicated for survival benefit with two-vessel disease with proximal LAD involvement.  Hopefully, he will get symptomatic relief but he does not have classical anginal symptoms so is hard to know.  He does have some issues with fatigue and shortness of breath with exertion which I think will improve after bypass grafting.  I made it very clear that surgery may or may not have any effect on his palpitations.  I discussed the general nature of the procedure, including the need for general anesthesia, the incisions to be used, the use of cardiopulmonary bypass, and the use of drainage tubes and temporary pacemaker wires postoperatively with Mr. Bail and his daughter.  We discussed the expected hospital stay, overall recovery and short and long term outcomes.  I informed them of the indications, risks, benefits, and alternatives.  They understand the risks include, but are not limited to death, stroke, MI, DVT/PE, bleeding, possible need for transfusion, infections, cardiac arrhythmias, as well as other organ system dysfunction including respiratory, renal, or GI complications.   He does have stage III chronic kidney disease with a creatinine of 1.3 baseline.  Some  increased risk for perioperative renal complications.  He understands and accepts the risks and agrees to proceed.  His primary concern is working out a plan of care for his wife.  He is her primary caregiver and he has not been able to make those arrangements yet.  Plan: Tentatively scheduled for coronary bypass grafting x3 on Monday, 05/07/2022 If he has any acceleration of symptoms should seek medical attention immediately.  Melrose Nakayama, MD [facility] [phone]

## 2022-04-23 NOTE — Progress Notes (Signed)
PCP is Dettinger, Luis Kaufmann, Luis Referring Provider is Luis Harp, Luis  Chief Complaint  Patient presents with   Coronary Artery Disease    New patient consultation CATH 7/3, ECHO 6/13    HPI: Luis Deleon is sent for consultation regarding two-vessel coronary disease.  Luis Deleon is a 81 year old man with a past history significant for hypertension, hyperlipidemia, Crohn's disease, multiple abdominal surgeries, prostate cancer with radical prostatectomy, stage III chronic kidney disease, remote tobacco abuse, and paroxysmal supraventricular tachycardia.  His primary complaint is palpitations.  He has been having those for a couple of years.  He describes as his heart beating very strongly.  His heart rate would usually be around 90 if he checked it.  On multiple occasions EMS was called and EKG did not show any ischemia.  It would usually last about an hour or 2 and then would resolve.  He was having that with increased frequency.  He had an event monitor for 30 days and did not have an episode.  He had a recurrent episode the day after the event monitor was removed.  He says he is not having any chest pain, pressure, or tightness.  He does get short of breath with exertion.  Also complains of dizziness particularly in the mornings and when he bends over and then stands up quickly.  Luis Deleon did a coronary CT which showed a coronary calcium score of greater than 3000.  He then underwent cardiac catheterization which revealed severe two-vessel coronary disease.  History of Crohn's disease with intermittent abdominal pain and prior surgeries.  About once every 4 to 5 months he will have to take Dilaudid for pain.  Does have swelling in his legs.  Worsened after he stopped hydrochlorothiazide.  That medication has been resumed.   Past Medical History:  Diagnosis Date   Arthritis    Balanoposthitis    CKD (chronic kidney disease), stage III ([place])    Crohn disease ([place])    followed by Luis  Luis Deleon--- dx 1984, terminal ileum-- prolonged clinical remission since bowel resection 2003   Diverticulosis    Edema of both lower extremities    Erythema    penis   Fatty liver    followed by Luis Luis Deleon   Gout    12-08-2020  per pt last episode approx. 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[facility])    vascular-- Luis Luis Deleon   Mixed hyperlipidemia    Multinodular thyroid    Benign bilateral bx's 08-09-2010 ;  last ultrasound in epic 09-19-2011    Nephrolithiasis    Polycythemia    currently followed by pcp;   previously seen by hematologist/ oncology--- Luis Luis Deleon,  lov in epic 01-28-2013  hx phlebotomy and donates blood   PSVT (paroxysmal supraventricular tachycardia) [facility])    cardiologist--- Luis Luis Deleon ; episdoe  at time of prostatectomy 2006 and recurrent 03/ 2019;  event monitor 01-15-2018 showed NSR,  echo 12-20-2017 with ef 60-65% mild LVH mild TR   Renal cyst, acquired    urologist-- Luis Luis Deleon   Vitamin D deficiency     Past Surgical History:  Procedure Laterality Date   BACTERIAL OVERGROWTH TEST N/A 06/15/2016   Procedure: BACTERIAL OVERGROWTH TEST;  Surgeon: Luis Deleon;  [facility];  Service: Gastroenterology;  Laterality: N/A;   COLONOSCOPY WITH PROPOFOL  last one 06-24-2019  Luis Deleon   CYSTOSCOPY  WITH URETHRAL DORSAL FORSKIN INCISION  10-10-2018  @[facility]    EXPLORATORY LAPAROTOMY W/ BOWEL RESECTION  05-22-2002   @[facility]    distal ileum and cecum / appendectomy     LAPAROSCOPIC ASSISTED VENTRAL HERNIA REPAIR  05-12-2003  @[facility]    LAPAROSCOPY EXTENSIVE LYSIS ADHESIONS AND VENTRAL HERNIA REPAIR  02-11-2008  @[facility]    LEFT HEART CATH AND CORONARY ANGIOGRAPHY N/A 04/16/2022   Procedure: LEFT HEART CATH AND CORONARY ANGIOGRAPHY;  Surgeon: Luis Harp, Luis;  Location: [place] CV LAB;  Service: Cardiovascular;  Laterality: N/A;   LITHOTRIPSY  1980   PENILE BIOPSY N/A 12/13/2020   Procedure: PENILE AND FORESKIN BIOPSY;  Surgeon: Luis Aloe, Luis;  Location: [facility];  Service: Urology;  Laterality: N/A;   RETROPUBIC PROSTATECTOMY  11-16-2004   @[facility]    TONSILLECTOMY  age 40    Family History  Problem Relation Age of Onset   Heart disease Mother    Cervical cancer Mother    Heart disease Father    Heart failure Father    Dislocations Daughter 8       Bilateral knee   Heart disease Maternal Uncle    Colon cancer Neg Hx    Esophageal cancer Neg Hx    Rectal cancer Neg Hx    Stomach cancer Neg Hx    Colon polyps Neg Hx     Social History Social History   Tobacco Use   Smoking status: Former    Packs/day: 1.00    Years: 38.00    Total pack years: 38.00    Types: Cigarettes    Quit date: 03/19/1991    Years since quitting: 31.1   Smokeless tobacco: Current    Types: Chew  Vaping Use   Vaping Use: Never used  Substance Use Topics   Alcohol use: No   Drug use: No    Current Outpatient Medications  Medication Sig Dispense Refill   allopurinol (ZYLOPRIM) 100 MG tablet Take 1 tablet (100 mg total) by mouth 2 (two) times daily. 180 tablet 3   aspirin 81 MG EC tablet Take 81 mg by mouth every evening.     atorvastatin (LIPITOR) 40 MG tablet TAKE 1 TABLET BY MOUTH DAILY AT 6 PM. 90 tablet 3   baclofen (LIORESAL) 10 MG tablet TAKE 1 TABLET BY MOUTH AT BEDTIME AS NEEDED FOR MUSCLE SPASMS. 90 tablet 3   Cholecalciferol (VITAMIN D3) 125 MCG (5000 UT) CAPS Take 1 capsule by mouth See  admin instructions. Take 1 tablet by mouth every day except on Sundays.     gemfibrozil (LOPID) 600 MG tablet Take 1 tablet (600 mg total) by mouth 3 (three) times a week. Monday , Wednesday, Friday (Patient taking differently: Take 600 mg by mouth every 3 (three) days.) 39 tablet 3   hydrochlorothiazide (HYDRODIURIL) 12.5 MG tablet Take 1 tablet (12.5 mg total) by mouth daily. 90 tablet 3   HYDROmorphone (DILAUDID) 2 MG  tablet Take 2 mg by mouth daily as needed for severe pain.     losartan (COZAAR) 25 MG tablet Take 1 tablet (25 mg total) by mouth daily. 90 tablet 3   metoprolol tartrate (LOPRESSOR) 50 MG tablet TAKE 1 TABLET BY MOUTH TWICE A DAY 180 tablet 3   nystatin-triamcinolone (MYCOLOG II) cream Apply 1 application topically 2 (two) times daily. (Patient taking differently: Apply 1 application  topically 2 (two) times daily as needed.) 30 g 3   OMEGA-3 1000 MG CAPS Take 2 capsules by mouth 2 (two) times daily.     potassium chloride SA (KLOR-CON M20) 20 MEQ tablet Take 2 tablets (40 mEq total) by mouth daily. 180 tablet 3   Current Facility-Administered Medications  Medication Dose Route Frequency Provider Last Rate Last Admin   sodium chloride flush (NS) 0.9 % injection 3 mL  3 mL Intravenous Q12H Luis Harp, Luis        Allergies  Allergen Reactions   Colchicine Diarrhea, Nausea And Vomiting and Other (See Comments)    REACTION: nausea, diarrhea and interactions with 61m    Review of Systems  Constitutional:  Positive for activity change and fatigue.  HENT:  Positive for dental problem (Dentures) and hearing loss. Negative for trouble swallowing and voice change.   Eyes:  Negative for visual disturbance.  Respiratory:  Positive for shortness of breath.   Cardiovascular:  Positive for palpitations and leg swelling. Negative for chest pain.  Gastrointestinal:  Positive for abdominal pain and diarrhea.  Genitourinary:  Positive for frequency. Negative for dysuria.  Skin:   Positive for rash.       Itching  Neurological:  Positive for dizziness. Negative for seizures and weakness.       Memory problems  Hematological:  Bruises/bleeds easily.  All other systems reviewed and are negative.   BP (!) 157/88 (BP Location: Left Arm, Patient Position: Sitting, Cuff Size: Normal)   Pulse 63   Resp 20   Ht 5' 8"  (1.727 m)   Wt 235 lb (106.6 kg)   SpO2 94% Comment: RA  BMI 35.73 kg/m  Physical Exam Vitals reviewed.  Constitutional:      General: He is not in acute distress.    Appearance: He is obese.  HENT:     Head: Normocephalic and atraumatic.  Eyes:     General: No scleral icterus.    Extraocular Movements: Extraocular movements intact.  Neck:     Vascular: No carotid bruit.  Cardiovascular:     Rate and Rhythm: Normal rate and regular rhythm.     Heart sounds: Normal heart sounds. No murmur heard.    No friction rub. No gallop.  Pulmonary:     Effort: Pulmonary effort is normal. No respiratory distress.     Breath sounds: Normal breath sounds. No wheezing or rales.  Abdominal:     General: There is distension.     Palpations: Abdomen is soft.  Musculoskeletal:     Cervical back: Neck supple.     Right lower leg: Edema (1+) present.     Left lower leg: Edema (1+) present.  Skin:    General: Skin is warm and dry.  Neurological:     General: No focal deficit present.     Mental Status: He is alert and oriented to person, place, and time.     Cranial Nerves: No cranial nerve deficit.     Motor: No weakness.     Diagnostic Tests: Echocardiogram 03/27/2022 IMPRESSIONS  1. Left ventricular ejection fraction, by estimation, is 60 to 65%. The  left ventricle has normal function. The left ventricle has no regional  wall motion abnormalities. Left ventricular diastolic parameters were  normal. The average left ventricular  global longitudinal strain is -24.8 %. The global longitudinal strain is  normal.   2. Right ventricular systolic  function is normal. The right ventricular  size is normal. There is normal pulmonary artery systolic pressure. The  estimated right ventricular systolic pressure is 27.7 mmHg.   3. The mitral valve is normal in structure. Trivial mitral valve  regurgitation. No evidence of mitral stenosis.   4. The aortic valve is tricuspid. There is mild calcification of the  aortic valve. There is mild thickening of the aortic valve. Aortic valve  regurgitation is not visualized. Aortic valve sclerosis is present, with  no evidence of aortic valve stenosis.   5. The inferior vena cava is dilated in size with >50% respiratory  variability, suggesting right atrial pressure of 8 mmHg.   Cardiac catheterization 04/16/2022 Conclusion      Prox RCA lesion is 99% stenosed.   Prox RCA to Mid RCA lesion is 99% stenosed.   Mid RCA to Dist RCA lesion is 99% stenosed.   Dist RCA lesion is 99% stenosed.   Prox LAD to Mid LAD lesion is 95% stenosed.   1st Diag lesion is 95% stenosed. Diagnostic Dominance: Right  I personally reviewed the catheterization images.  There is a tight lesion in the proximal LAD at the takeoff of the first diagonal.  Also severe diffuse disease in the right coronary.  Impression: Luis Deleon is a 81 year old man with a past history significant for hypertension, hyperlipidemia, Crohn's disease, multiple abdominal surgeries, prostate cancer with radical prostatectomy, stage III chronic kidney disease, remote tobacco abuse, and paroxysmal supraventricular tachycardia.   He gives a history of very unusual symptoms, primarily just palpitations.  I have seen in other notes complaints of chest tightness or pressure but he denies that to me.  In any event, his symptoms led to a CT for coronary calcium scoring which showed two-vessel disease and a calcium score of greater than 3000.  That led to cardiac catheterization which revealed severe two-vessel coronary disease.  Ejection fraction is 60 to  65% by echocardiogram and there is no significant valvular disease.  Coronary bypass grafting is indicated for survival benefit with two-vessel disease with proximal LAD involvement.  Hopefully, he will get symptomatic relief but he does not have classical anginal symptoms so is hard to know.  He does have some issues with fatigue and shortness of breath with exertion which I think will improve after bypass grafting.  I made it very clear that surgery may or may not have any effect on his palpitations.  I discussed the general nature of the procedure, including the need for general anesthesia, the incisions to be used, the use of cardiopulmonary bypass, and the use of drainage tubes and temporary pacemaker wires postoperatively with Mr. Dorian and his daughter.  We discussed the expected hospital stay, overall recovery and short and long term outcomes.  I informed them of the indications, risks, benefits, and alternatives.  They understand the risks include, but are not limited to death, stroke, MI, DVT/PE, bleeding, possible need for transfusion, infections, cardiac arrhythmias, as well as other organ system dysfunction including respiratory, renal, or GI complications.   He does have stage III chronic kidney disease with a creatinine of 1.3 baseline.  Some  increased risk for perioperative renal complications.  He understands and accepts the risks and agrees to proceed.  His primary concern is working out a plan of care for his wife.  He is her primary caregiver and he has not been able to make those arrangements yet.  Plan: Tentatively scheduled for coronary bypass grafting x3 on Monday, 05/07/2022 If he has any acceleration of symptoms should seek medical attention immediately.  Melrose Nakayama, Luis [facility] [phone]

## 2022-04-24 ENCOUNTER — Encounter: Payer: Self-pay | Admitting: *Deleted

## 2022-05-02 NOTE — Progress Notes (Signed)
Surgical Instructions    Your procedure is scheduled on Monday July 24.  Report to [facility] Main Entrance "A" at 5:30 A.M., then check in with the Admitting office.  Call this number if you have problems the morning of surgery:  [phone]   If you have any questions prior to your surgery date call [phone]: Open Monday-Friday 8am-4pm    Remember:  Do not eat or drink anything after midnight the night before your surgery    Take these medicines the morning of surgery with A SIP OF WATER:  allopurinol (ZYLOPRIM)  gemfibrozil (LOPID) metoprolol tartrate (LOPRESSOR) potassium chloride SA (KLOR-CON M20)   As of today, STOP taking Omega-3 Fatty Acids (OMEGA-3 FISH OIL)  Aleve, Naproxen, Ibuprofen, Motrin, Advil, Goody's, BC's, all herbal medications, fish oil, and all vitamins.  FOLLOW YOUR SURGEON'S INSTRUCTIONS REGARDING WHEN TO STOP ASPIRIN.         Do not wear jewelry or makeup Do not wear lotions, powders, perfumes/colognes, or deodorant. Do not shave 48 hours prior to surgery.  Men may shave face and neck. Do not bring valuables to the hospital. Do not wear nail polish, gel polish, artificial nails, or any other type of covering on natural nails (fingers and toes) If you have artificial nails or gel coating that need to be removed by a nail salon, please have this removed prior to surgery. Artificial nails or gel coating may interfere with anesthesia's ability to adequately monitor your vital signs.  [facility] is not responsible for any belongings or valuables. .   Do NOT Smoke (Tobacco/Vaping)  24 hours prior to your procedure  If you use a CPAP at night, you may bring your mask for your overnight stay.   Contacts, glasses, hearing aids, dentures or partials may not be worn into surgery, please bring cases for these belongings   For patients admitted to the hospital, discharge time will be determined by your treatment team.   Patients discharged the day of surgery  will not be allowed to drive home, and someone needs to stay with them for 24 hours.   SURGICAL WAITING ROOM VISITATION Patients having surgery or a procedure may have no more than 2 support people in the waiting area - these visitors may rotate.   Children under the age of 36 must have an adult with them who is not the patient. If the patient needs to stay at the hospital during part of their recovery, the visitor guidelines for inpatient rooms apply. Pre-op nurse will coordinate an appropriate time for 1 support person to accompany patient in pre-op.  This support person may not rotate.   Please refer to the [facility] website for the visitor guidelines for Inpatients (after your surgery is over and you are in a regular room).    Special instructions:    Oral Hygiene is also important to reduce your risk of infection.  Remember - BRUSH YOUR TEETH THE MORNING OF SURGERY WITH YOUR REGULAR TOOTHPASTE   [place]- Preparing For Surgery  Before surgery, you can play an important role. Because skin is not sterile, your skin needs to be as free of germs as possible. You can reduce the number of germs on your skin by washing with CHG (chlorahexidine gluconate) Soap before surgery.  CHG is an antiseptic cleaner which kills germs and bonds with the skin to continue killing germs even after washing.     Please do not use if you have an allergy to CHG or antibacterial soaps.  If your skin becomes reddened/irritated stop using the CHG.  Do not shave (including legs and underarms) for at least 48 hours prior to first CHG shower. It is OK to shave your face.  Please follow these instructions carefully.     Shower the NIGHT BEFORE SURGERY and the MORNING OF SURGERY with CHG Soap.   If you chose to wash your hair, wash your hair first as usual with your normal shampoo. After you shampoo, rinse your hair and body thoroughly to remove the shampoo.  Then [employer] and genitals (private parts) with your  normal soap and rinse thoroughly to remove soap.  After that Use CHG Soap as you would any other liquid soap. You can apply CHG directly to the skin and wash gently with a scrungie or a clean washcloth.   Apply the CHG Soap to your body ONLY FROM THE NECK DOWN.  Do not use on open wounds or open sores. Avoid contact with your eyes, ears, mouth and genitals (private parts). Wash Face and genitals (private parts)  with your normal soap.   Wash thoroughly, paying special attention to the area where your surgery will be performed.  Thoroughly rinse your body with warm water from the neck down.  DO NOT shower/wash with your normal soap after using and rinsing off the CHG Soap.  Pat yourself dry with a CLEAN TOWEL.  Wear CLEAN PAJAMAS to bed the night before surgery  Place CLEAN SHEETS on your bed the night before your surgery  DO NOT SLEEP WITH PETS.   Day of Surgery:  Take a shower with CHG soap. Wear Clean/Comfortable clothing the morning of surgery Do not apply any deodorants/lotions.   Remember to brush your teeth WITH YOUR REGULAR TOOTHPASTE.    If you received a COVID test during your pre-op visit, it is requested that you wear a mask when out in public, stay away from anyone that may not be feeling well, and notify your surgeon if you develop symptoms. If you have been in contact with anyone that has tested positive in the last 10 days, please notify your surgeon.    Please read over the following fact sheets that you were given.

## 2022-05-03 ENCOUNTER — Other Ambulatory Visit (HOSPITAL_COMMUNITY): Payer: Medicare HMO

## 2022-05-03 ENCOUNTER — Other Ambulatory Visit: Payer: Self-pay

## 2022-05-03 ENCOUNTER — Ambulatory Visit (HOSPITAL_COMMUNITY)
Admission: RE | Admit: 2022-05-03 | Discharge: 2022-05-03 | Disposition: A | Discharge status: Home / Self Care | Payer: Medicare HMO | Source: Ambulatory Visit | Attending: Thoracic Surgery (Cardiothoracic Vascular Surgery) | Admitting: Thoracic Surgery (Cardiothoracic Vascular Surgery)

## 2022-05-03 ENCOUNTER — Encounter (HOSPITAL_COMMUNITY)
Admission: RE | Admit: 2022-05-03 | Discharge: 2022-05-03 | Disposition: A | Discharge status: Home / Self Care | Payer: Medicare HMO | Source: Ambulatory Visit | Attending: Thoracic Surgery (Cardiothoracic Vascular Surgery) | Admitting: Thoracic Surgery (Cardiothoracic Vascular Surgery)

## 2022-05-03 ENCOUNTER — Encounter (HOSPITAL_COMMUNITY): Payer: Self-pay

## 2022-05-03 VITALS — BP 114/53 | HR 51 | Temp 97.9°F | Resp 18 | Ht 68.0 in | Wt 233.8 lb

## 2022-05-03 DIAGNOSIS — I10 Essential (primary) hypertension: Secondary | ICD-10-CM | POA: Insufficient documentation

## 2022-05-03 DIAGNOSIS — Z01818 Encounter for other preprocedural examination: Secondary | ICD-10-CM | POA: Diagnosis not present

## 2022-05-03 DIAGNOSIS — Z20822 Contact with and (suspected) exposure to covid-19: Secondary | ICD-10-CM | POA: Insufficient documentation

## 2022-05-03 DIAGNOSIS — I129 Hypertensive chronic kidney disease with stage 1 through stage 4 chronic kidney disease, or unspecified chronic kidney disease: Secondary | ICD-10-CM | POA: Insufficient documentation

## 2022-05-03 DIAGNOSIS — N183 Chronic kidney disease, stage 3 unspecified: Secondary | ICD-10-CM | POA: Insufficient documentation

## 2022-05-03 DIAGNOSIS — Z87891 Personal history of nicotine dependence: Secondary | ICD-10-CM | POA: Insufficient documentation

## 2022-05-03 DIAGNOSIS — I700 Atherosclerosis of aorta: Secondary | ICD-10-CM | POA: Diagnosis not present

## 2022-05-03 DIAGNOSIS — I25118 Atherosclerotic heart disease of native coronary artery with other forms of angina pectoris: Secondary | ICD-10-CM | POA: Insufficient documentation

## 2022-05-03 DIAGNOSIS — E785 Hyperlipidemia, unspecified: Secondary | ICD-10-CM | POA: Diagnosis not present

## 2022-05-03 HISTORY — DX: Dyspnea, unspecified: R06.00

## 2022-05-03 HISTORY — DX: Atherosclerotic heart disease of native coronary artery without angina pectoris: I25.10

## 2022-05-03 LAB — COMPREHENSIVE METABOLIC PANEL
ALT: 20 U/L (ref 0–44)
AST: 28 U/L (ref 15–41)
Albumin: 3.1 g/dL — ABNORMAL LOW (ref 3.5–5.0)
Alkaline Phosphatase: 46 U/L (ref 38–126)
Anion gap: 7 (ref 5–15)
BUN: 18 mg/dL (ref 8–23)
CO2: 20 mmol/L — ABNORMAL LOW (ref 22–32)
Calcium: 9.4 mg/dL (ref 8.9–10.3)
Chloride: 113 mmol/L — ABNORMAL HIGH (ref 98–111)
Creatinine, Ser: 1.29 mg/dL — ABNORMAL HIGH (ref 0.61–1.24)
GFR, Estimated: 57 mL/min — ABNORMAL LOW (ref >60)
Glucose, Bld: 74 mg/dL (ref 70–99)
Potassium: 4.4 mmol/L (ref 3.5–5.1)
Sodium: 140 mmol/L (ref 135–145)
Total Bilirubin: 2.8 mg/dL — ABNORMAL HIGH (ref 0.3–1.2)
Total Protein: 6.1 g/dL — ABNORMAL LOW (ref 6.5–8.1)

## 2022-05-03 LAB — URINALYSIS, ROUTINE W REFLEX MICROSCOPIC
Bilirubin Urine: NEGATIVE
Glucose, UA: NEGATIVE mg/dL
Hgb urine dipstick: NEGATIVE
Ketones, ur: NEGATIVE mg/dL
Leukocytes,Ua: NEGATIVE
Nitrite: NEGATIVE
Protein, ur: NEGATIVE mg/dL
Specific Gravity, Urine: 1.014 (ref 1.005–1.030)
pH: 5 (ref 5.0–8.0)

## 2022-05-03 LAB — BLOOD GAS, ARTERIAL
Acid-Base Excess: 0.8 mmol/L (ref 0.0–2.0)
Bicarbonate: 25.3 mmol/L (ref 20.0–28.0)
Drawn by: 58793
O2 Saturation: 98.4 %
Patient temperature: 37
pCO2 arterial: 39 mmHg (ref 32–48)
pH, Arterial: 7.42 (ref 7.35–7.45)
pO2, Arterial: 92 mmHg (ref 83–108)

## 2022-05-03 LAB — TYPE AND SCREEN
ABO/RH(D): A POS
Antibody Screen: NEGATIVE

## 2022-05-03 LAB — APTT: aPTT: 30 seconds (ref 24–36)

## 2022-05-03 LAB — HEMOGLOBIN A1C
Hgb A1c MFr Bld: 5.5 % (ref 4.8–5.6)
Mean Plasma Glucose: 111.15 mg/dL

## 2022-05-03 LAB — PROTIME-INR
INR: 1 (ref 0.8–1.2)
Prothrombin Time: 13.4 seconds (ref 11.4–15.2)

## 2022-05-03 LAB — CBC
HCT: 52.1 % — ABNORMAL HIGH (ref 39.0–52.0)
Hemoglobin: 17.7 g/dL — ABNORMAL HIGH (ref 13.0–17.0)
MCH: 32.2 pg (ref 26.0–34.0)
MCHC: 34 g/dL (ref 30.0–36.0)
MCV: 94.9 fL (ref 80.0–100.0)
Platelets: 136 10*3/uL — ABNORMAL LOW (ref 150–400)
RBC: 5.49 MIL/uL (ref 4.22–5.81)
RDW: 14.8 % (ref 11.5–15.5)
WBC: 9.9 10*3/uL (ref 4.0–10.5)
nRBC: 0 % (ref 0.0–0.2)

## 2022-05-03 LAB — SURGICAL PCR SCREEN
MRSA, PCR: NEGATIVE
Staphylococcus aureus: NEGATIVE

## 2022-05-03 LAB — SARS CORONAVIRUS 2 (TAT 6-24 HRS): SARS Coronavirus 2: NEGATIVE

## 2022-05-03 NOTE — Progress Notes (Signed)
   05/03/22 1010  OBSTRUCTIVE SLEEP APNEA  Have you ever been diagnosed with sleep apnea through a sleep study? No  Do you snore loudly (loud enough to be heard through closed doors)?  0  Do you often feel tired, fatigued, or sleepy during the daytime (such as falling asleep during driving or talking to someone)? 0  Has anyone observed you stop breathing during your sleep? 0  Do you have, or are you being treated for high blood pressure? 1  BMI more than 35 kg/m2? 1  Age > 50 (1-yes) 1  Neck circumference greater than:Male 16 inches or larger, Male 17inches or larger? 1  Male Gender (Yes=1) 1  Obstructive Sleep Apnea Score 5  Score 5 or greater  Results sent to PCP

## 2022-05-03 NOTE — Progress Notes (Signed)
VASCULAR LAB    Pre CABG Dopplers have been performed.  See CV proc for preliminary results.   Jamison Yuhasz, RVT 05/03/2022, 8:55 AM

## 2022-05-03 NOTE — Progress Notes (Signed)
PCP - Dr. Vonna Kotyk Dettinger Cardiologist - Dr. Quay Burow  PPM/ICD - denies   Chest x-ray - 05/03/22 EKG - 04/10/22 Stress Test - 01/14/18 ECHO - 03/27/22 Cardiac Cath - 04/16/22  Sleep Study - denies   DM- denies  Blood Thinner Instructions: n/a Aspirin Instructions: Continue thru day before surgery  ERAS Protcol - no, NPO   COVID TEST- 05/03/22   Anesthesia review: yes, cardiac hx  Patient denies shortness of breath, fever, cough and chest pain at PAT appointment   All instructions explained to the patient, with a verbal understanding of the material. Patient agrees to go over the instructions while at home for a better understanding. Patient also instructed to wear a mask in public after being tested for COVID-19. The opportunity to ask questions was provided.

## 2022-05-04 ENCOUNTER — Encounter: Payer: Medicare HMO | Admitting: Thoracic Surgery (Cardiothoracic Vascular Surgery)

## 2022-05-04 MED ORDER — CEFAZOLIN SODIUM-DEXTROSE 2-4 GM/100ML-% IV SOLN
2.0000 g | INTRAVENOUS | Status: AC
  Administered 2022-05-07 (×2): 2 g via INTRAVENOUS
  Filled 2022-05-04: qty 100

## 2022-05-04 MED ORDER — MILRINONE LACTATE IN DEXTROSE 20-5 MG/100ML-% IV SOLN
0.3000 ug/kg/min | INTRAVENOUS | Status: DC
  Filled 2022-05-04: qty 100

## 2022-05-04 MED ORDER — INSULIN REGULAR(HUMAN) IN NACL 100-0.9 UT/100ML-% IV SOLN
INTRAVENOUS | Status: AC
  Administered 2022-05-07: 2.8 [IU]/h via INTRAVENOUS
  Filled 2022-05-04: qty 100

## 2022-05-04 MED ORDER — TRANEXAMIC ACID (OHS) PUMP PRIME SOLUTION
2.0000 mg/kg | INTRAVENOUS | Status: DC
  Filled 2022-05-04: qty 1.67

## 2022-05-04 MED ORDER — DEXMEDETOMIDINE HCL IN NACL 400 MCG/100ML IV SOLN
0.1000 ug/kg/h | INTRAVENOUS | Status: AC
  Administered 2022-05-07: .5 ug/kg/h via INTRAVENOUS
  Filled 2022-05-04: qty 100

## 2022-05-04 MED ORDER — MAGNESIUM SULFATE 50 % IJ SOLN
40.0000 meq | INTRAMUSCULAR | Status: DC
  Filled 2022-05-04: qty 9.85

## 2022-05-04 MED ORDER — VANCOMYCIN HCL 1500 MG/300ML IV SOLN
1500.0000 mg | INTRAVENOUS | Status: AC
  Administered 2022-05-07: 1500 mg via INTRAVENOUS
  Filled 2022-05-04: qty 300

## 2022-05-04 MED ORDER — PHENYLEPHRINE HCL-NACL 20-0.9 MG/250ML-% IV SOLN
30.0000 ug/min | INTRAVENOUS | Status: AC
  Administered 2022-05-07: 40 ug/min via INTRAVENOUS
  Filled 2022-05-04: qty 250

## 2022-05-04 MED ORDER — TRANEXAMIC ACID (OHS) BOLUS VIA INFUSION
15.0000 mg/kg | INTRAVENOUS | Status: AC
  Administered 2022-05-07: 1252.5 mg via INTRAVENOUS
  Filled 2022-05-04: qty 1253

## 2022-05-04 MED ORDER — TRANEXAMIC ACID 1000 MG/10ML IV SOLN
1.5000 mg/kg/h | INTRAVENOUS | Status: AC
  Administered 2022-05-07: 1.5 mg/kg/h via INTRAVENOUS
  Filled 2022-05-04: qty 25

## 2022-05-04 MED ORDER — HEPARIN 30,000 UNITS/1000 ML (OHS) CELLSAVER SOLUTION
Status: DC
  Filled 2022-05-04: qty 1000

## 2022-05-04 MED ORDER — POTASSIUM CHLORIDE 2 MEQ/ML IV SOLN
80.0000 meq | INTRAVENOUS | Status: DC
  Filled 2022-05-04: qty 40

## 2022-05-04 MED ORDER — NITROGLYCERIN IN D5W 200-5 MCG/ML-% IV SOLN
2.0000 ug/min | INTRAVENOUS | Status: DC
  Filled 2022-05-04: qty 250

## 2022-05-04 MED ORDER — NOREPINEPHRINE 4 MG/250ML-% IV SOLN
0.0000 ug/min | INTRAVENOUS | Status: DC
  Filled 2022-05-04: qty 250

## 2022-05-04 MED ORDER — EPINEPHRINE HCL 5 MG/250ML IV SOLN IN NS
0.0000 ug/min | INTRAVENOUS | Status: DC
  Filled 2022-05-04: qty 250

## 2022-05-04 MED ORDER — CEFAZOLIN SODIUM-DEXTROSE 2-4 GM/100ML-% IV SOLN
2.0000 g | INTRAVENOUS | Status: DC
  Filled 2022-05-04: qty 100

## 2022-05-04 MED ORDER — PLASMA-LYTE A IV SOLN
INTRAVENOUS | Status: DC
  Filled 2022-05-04: qty 2.5

## 2022-05-06 NOTE — Anesthesia Preprocedure Evaluation (Addendum)
Anesthesia Evaluation  Patient identified by MRN, date of birth, ID band Patient awake    Reviewed: Allergy & Precautions, NPO status , Patient's Chart, lab work & pertinent test results, reviewed documented beta blocker date and time   History of Anesthesia Complications Negative for: history of anesthetic complications  Airway Mallampati: II  TM Distance: >3 FB Neck ROM: Full    Dental  (+) Edentulous Upper, Missing, Dental Advisory Given   Pulmonary former smoker,    breath sounds clear to auscultation       Cardiovascular hypertension, Pt. on medications and Pt. on home beta blockers (-) angina+ CAD (severe LAD, RCA disease) and + Peripheral Vascular Disease  + dysrhythmias Supra Ventricular Tachycardia  Rhythm:Regular Rate:Normal  03/2022 ECHO: EF 60-65%, Normal LVF, normal RVF, no significant valvular abnormalities   Neuro/Psych negative neurological ROS     GI/Hepatic Neg liver ROS, Crohn's   Endo/Other  Morbid obesity  Renal/GU Renal InsufficiencyRenal disease   Prostate cancer    Musculoskeletal  (+) Arthritis ,   Abdominal (+) + obese,   Peds  Hematology Hb 17.7, plt 136k   Anesthesia Other Findings   Reproductive/Obstetrics                            Anesthesia Physical Anesthesia Plan  ASA: 3  Anesthesia Plan: General   Post-op Pain Management:    Induction: Intravenous  PONV Risk Score and Plan: 2 and Treatment may vary due to age or medical condition  Airway Management Planned: Oral ETT  Additional Equipment: Arterial line, CVP, PA Cath and TEE  Intra-op Plan:   Post-operative Plan: Post-operative intubation/ventilation  Informed Consent: I have reviewed the patients History and Physical, chart, labs and discussed the procedure including the risks, benefits and alternatives for the proposed anesthesia with the patient or authorized representative who has  indicated his/her understanding and acceptance.     Dental advisory given  Plan Discussed with: CRNA and Surgeon  Anesthesia Plan Comments:        Anesthesia Quick Evaluation

## 2022-05-07 ENCOUNTER — Other Ambulatory Visit: Payer: Self-pay

## 2022-05-07 ENCOUNTER — Inpatient Hospital Stay (HOSPITAL_COMMUNITY): Payer: Medicare HMO

## 2022-05-07 ENCOUNTER — Inpatient Hospital Stay (HOSPITAL_COMMUNITY)
Admission: RE | Admit: 2022-05-07 | Discharge: 2022-05-17 | DRG: 236 | Disposition: A | Discharge status: Home Health | Payer: Medicare HMO | Attending: Thoracic Surgery (Cardiothoracic Vascular Surgery) | Admitting: Thoracic Surgery (Cardiothoracic Vascular Surgery)

## 2022-05-07 ENCOUNTER — Encounter (HOSPITAL_COMMUNITY): Payer: Self-pay | Admitting: Thoracic Surgery (Cardiothoracic Vascular Surgery)

## 2022-05-07 ENCOUNTER — Inpatient Hospital Stay (HOSPITAL_COMMUNITY): Payer: Medicare HMO | Admitting: Certified Registered Nurse Anesthetist

## 2022-05-07 ENCOUNTER — Inpatient Hospital Stay (HOSPITAL_COMMUNITY)
Admission: RE | Disposition: A | Discharge status: Home Health | Payer: Self-pay | Source: Home / Self Care | Attending: Thoracic Surgery (Cardiothoracic Vascular Surgery)

## 2022-05-07 DIAGNOSIS — I4892 Unspecified atrial flutter: Secondary | ICD-10-CM | POA: Diagnosis not present

## 2022-05-07 DIAGNOSIS — I739 Peripheral vascular disease, unspecified: Secondary | ICD-10-CM | POA: Diagnosis not present

## 2022-05-07 DIAGNOSIS — K5090 Crohn's disease, unspecified, without complications: Secondary | ICD-10-CM | POA: Diagnosis present

## 2022-05-07 DIAGNOSIS — Z87891 Personal history of nicotine dependence: Secondary | ICD-10-CM | POA: Diagnosis not present

## 2022-05-07 DIAGNOSIS — I10 Essential (primary) hypertension: Secondary | ICD-10-CM

## 2022-05-07 DIAGNOSIS — R000 Tachycardia, unspecified: Secondary | ICD-10-CM | POA: Diagnosis not present

## 2022-05-07 DIAGNOSIS — I2510 Atherosclerotic heart disease of native coronary artery without angina pectoris: Secondary | ICD-10-CM

## 2022-05-07 DIAGNOSIS — E8770 Fluid overload, unspecified: Secondary | ICD-10-CM | POA: Diagnosis not present

## 2022-05-07 DIAGNOSIS — I808 Phlebitis and thrombophlebitis of other sites: Secondary | ICD-10-CM | POA: Diagnosis not present

## 2022-05-07 DIAGNOSIS — D62 Acute posthemorrhagic anemia: Secondary | ICD-10-CM | POA: Diagnosis not present

## 2022-05-07 DIAGNOSIS — Z888 Allergy status to other drugs, medicaments and biological substances status: Secondary | ICD-10-CM | POA: Diagnosis not present

## 2022-05-07 DIAGNOSIS — D696 Thrombocytopenia, unspecified: Secondary | ICD-10-CM | POA: Diagnosis present

## 2022-05-07 DIAGNOSIS — I129 Hypertensive chronic kidney disease with stage 1 through stage 4 chronic kidney disease, or unspecified chronic kidney disease: Secondary | ICD-10-CM | POA: Diagnosis not present

## 2022-05-07 DIAGNOSIS — K760 Fatty (change of) liver, not elsewhere classified: Secondary | ICD-10-CM | POA: Diagnosis not present

## 2022-05-07 DIAGNOSIS — J90 Pleural effusion, not elsewhere classified: Secondary | ICD-10-CM | POA: Diagnosis not present

## 2022-05-07 DIAGNOSIS — Z8546 Personal history of malignant neoplasm of prostate: Secondary | ICD-10-CM | POA: Diagnosis not present

## 2022-05-07 DIAGNOSIS — I700 Atherosclerosis of aorta: Secondary | ICD-10-CM | POA: Diagnosis not present

## 2022-05-07 DIAGNOSIS — Z6833 Body mass index (BMI) 33.0-33.9, adult: Secondary | ICD-10-CM

## 2022-05-07 DIAGNOSIS — I25118 Atherosclerotic heart disease of native coronary artery with other forms of angina pectoris: Secondary | ICD-10-CM

## 2022-05-07 DIAGNOSIS — J9811 Atelectasis: Secondary | ICD-10-CM | POA: Diagnosis not present

## 2022-05-07 DIAGNOSIS — K5900 Constipation, unspecified: Secondary | ICD-10-CM | POA: Diagnosis not present

## 2022-05-07 DIAGNOSIS — Z8249 Family history of ischemic heart disease and other diseases of the circulatory system: Secondary | ICD-10-CM | POA: Diagnosis not present

## 2022-05-07 DIAGNOSIS — I447 Left bundle-branch block, unspecified: Secondary | ICD-10-CM | POA: Diagnosis present

## 2022-05-07 DIAGNOSIS — E669 Obesity, unspecified: Secondary | ICD-10-CM | POA: Diagnosis not present

## 2022-05-07 DIAGNOSIS — I088 Other rheumatic multiple valve diseases: Secondary | ICD-10-CM | POA: Diagnosis not present

## 2022-05-07 DIAGNOSIS — M109 Gout, unspecified: Secondary | ICD-10-CM | POA: Diagnosis not present

## 2022-05-07 DIAGNOSIS — I471 Supraventricular tachycardia: Secondary | ICD-10-CM | POA: Diagnosis not present

## 2022-05-07 DIAGNOSIS — Z951 Presence of aortocoronary bypass graft: Secondary | ICD-10-CM | POA: Diagnosis not present

## 2022-05-07 DIAGNOSIS — I472 Ventricular tachycardia, unspecified: Secondary | ICD-10-CM | POA: Diagnosis not present

## 2022-05-07 DIAGNOSIS — I480 Paroxysmal atrial fibrillation: Secondary | ICD-10-CM | POA: Diagnosis not present

## 2022-05-07 DIAGNOSIS — N183 Chronic kidney disease, stage 3 unspecified: Secondary | ICD-10-CM | POA: Diagnosis present

## 2022-05-07 DIAGNOSIS — Z79899 Other long term (current) drug therapy: Secondary | ICD-10-CM | POA: Diagnosis not present

## 2022-05-07 DIAGNOSIS — E782 Mixed hyperlipidemia: Secondary | ICD-10-CM | POA: Diagnosis present

## 2022-05-07 DIAGNOSIS — I4729 Other ventricular tachycardia: Secondary | ICD-10-CM | POA: Diagnosis not present

## 2022-05-07 DIAGNOSIS — Z7982 Long term (current) use of aspirin: Secondary | ICD-10-CM

## 2022-05-07 DIAGNOSIS — K219 Gastro-esophageal reflux disease without esophagitis: Secondary | ICD-10-CM | POA: Diagnosis present

## 2022-05-07 DIAGNOSIS — D72829 Elevated white blood cell count, unspecified: Secondary | ICD-10-CM | POA: Diagnosis not present

## 2022-05-07 DIAGNOSIS — R001 Bradycardia, unspecified: Secondary | ICD-10-CM | POA: Diagnosis not present

## 2022-05-07 DIAGNOSIS — R5381 Other malaise: Secondary | ICD-10-CM | POA: Diagnosis present

## 2022-05-07 HISTORY — PX: TEE WITHOUT CARDIOVERSION: SHX5443

## 2022-05-07 HISTORY — PX: CORONARY ARTERY BYPASS GRAFT: SHX141

## 2022-05-07 LAB — POCT I-STAT 7, (LYTES, BLD GAS, ICA,H+H)
Acid-Base Excess: 0 mmol/L (ref 0.0–2.0)
Acid-Base Excess: 0 mmol/L (ref 0.0–2.0)
Acid-Base Excess: 2 mmol/L (ref 0.0–2.0)
Acid-base deficit: 1 mmol/L (ref 0.0–2.0)
Acid-base deficit: 3 mmol/L — ABNORMAL HIGH (ref 0.0–2.0)
Acid-base deficit: 6 mmol/L — ABNORMAL HIGH (ref 0.0–2.0)
Acid-base deficit: 6 mmol/L — ABNORMAL HIGH (ref 0.0–2.0)
Bicarbonate: 26.9 mmol/L (ref 20.0–28.0)
Bicarbonate: 26.1 mmol/L (ref 20.0–28.0)
Bicarbonate: 25.2 mmol/L (ref 20.0–28.0)
Bicarbonate: 23.6 mmol/L (ref 20.0–28.0)
Bicarbonate: 22.6 mmol/L (ref 20.0–28.0)
Bicarbonate: 18.7 mmol/L — ABNORMAL LOW (ref 20.0–28.0)
Bicarbonate: 19.3 mmol/L — ABNORMAL LOW (ref 20.0–28.0)
Calcium, Ion: 1.28 mmol/L (ref 1.15–1.40)
Calcium, Ion: 1.01 mmol/L — ABNORMAL LOW (ref 1.15–1.40)
Calcium, Ion: 1.06 mmol/L — ABNORMAL LOW (ref 1.15–1.40)
Calcium, Ion: 1.1 mmol/L — ABNORMAL LOW (ref 1.15–1.40)
Calcium, Ion: 1.17 mmol/L (ref 1.15–1.40)
Calcium, Ion: 1.15 mmol/L (ref 1.15–1.40)
Calcium, Ion: 1.15 mmol/L (ref 1.15–1.40)
HCT: 44 % (ref 39.0–52.0)
HCT: 35 % — ABNORMAL LOW (ref 39.0–52.0)
HCT: 33 % — ABNORMAL LOW (ref 39.0–52.0)
HCT: 38 % — ABNORMAL LOW (ref 39.0–52.0)
HCT: 40 % (ref 39.0–52.0)
HCT: 35 % — ABNORMAL LOW (ref 39.0–52.0)
HCT: 35 % — ABNORMAL LOW (ref 39.0–52.0)
Hemoglobin: 15 g/dL (ref 13.0–17.0)
Hemoglobin: 11.9 g/dL — ABNORMAL LOW (ref 13.0–17.0)
Hemoglobin: 11.2 g/dL — ABNORMAL LOW (ref 13.0–17.0)
Hemoglobin: 12.9 g/dL — ABNORMAL LOW (ref 13.0–17.0)
Hemoglobin: 13.6 g/dL (ref 13.0–17.0)
Hemoglobin: 11.9 g/dL — ABNORMAL LOW (ref 13.0–17.0)
Hemoglobin: 11.9 g/dL — ABNORMAL LOW (ref 13.0–17.0)
O2 Saturation: 100 %
O2 Saturation: 100 %
O2 Saturation: 100 %
O2 Saturation: 100 %
O2 Saturation: 98 %
O2 Saturation: 96 %
O2 Saturation: 97 %
Patient temperature: 35.9
Patient temperature: 37.3
Patient temperature: 37.6
Potassium: 3.3 mmol/L — ABNORMAL LOW (ref 3.5–5.1)
Potassium: 3.7 mmol/L (ref 3.5–5.1)
Potassium: 4.6 mmol/L (ref 3.5–5.1)
Potassium: 4.1 mmol/L (ref 3.5–5.1)
Potassium: 3.8 mmol/L (ref 3.5–5.1)
Potassium: 3.5 mmol/L (ref 3.5–5.1)
Potassium: 3.6 mmol/L (ref 3.5–5.1)
Sodium: 143 mmol/L (ref 135–145)
Sodium: 142 mmol/L (ref 135–145)
Sodium: 137 mmol/L (ref 135–145)
Sodium: 140 mmol/L (ref 135–145)
Sodium: 141 mmol/L (ref 135–145)
Sodium: 142 mmol/L (ref 135–145)
Sodium: 143 mmol/L (ref 135–145)
TCO2: 29 mmol/L (ref 22–32)
TCO2: 27 mmol/L (ref 22–32)
TCO2: 26 mmol/L (ref 22–32)
TCO2: 25 mmol/L (ref 22–32)
TCO2: 24 mmol/L (ref 22–32)
TCO2: 20 mmol/L — ABNORMAL LOW (ref 22–32)
TCO2: 20 mmol/L — ABNORMAL LOW (ref 22–32)
pCO2 arterial: 53.9 mmHg — ABNORMAL HIGH (ref 32–48)
pCO2 arterial: 45.1 mmHg (ref 32–48)
pCO2 arterial: 35.3 mmHg (ref 32–48)
pCO2 arterial: 38.2 mmHg (ref 32–48)
pCO2 arterial: 39.6 mmHg (ref 32–48)
pCO2 arterial: 35.8 mmHg (ref 32–48)
pCO2 arterial: 38.3 mmHg (ref 32–48)
pH, Arterial: 7.306 — ABNORMAL LOW (ref 7.35–7.45)
pH, Arterial: 7.37 (ref 7.35–7.45)
pH, Arterial: 7.462 — ABNORMAL HIGH (ref 7.35–7.45)
pH, Arterial: 7.399 (ref 7.35–7.45)
pH, Arterial: 7.359 (ref 7.35–7.45)
pH, Arterial: 7.328 — ABNORMAL LOW (ref 7.35–7.45)
pH, Arterial: 7.313 — ABNORMAL LOW (ref 7.35–7.45)
pO2, Arterial: 495 mmHg — ABNORMAL HIGH (ref 83–108)
pO2, Arterial: 448 mmHg — ABNORMAL HIGH (ref 83–108)
pO2, Arterial: 350 mmHg — ABNORMAL HIGH (ref 83–108)
pO2, Arterial: 194 mmHg — ABNORMAL HIGH (ref 83–108)
pO2, Arterial: 100 mmHg (ref 83–108)
pO2, Arterial: 92 mmHg (ref 83–108)
pO2, Arterial: 106 mmHg (ref 83–108)

## 2022-05-07 LAB — POCT I-STAT, CHEM 8
BUN: 16 mg/dL (ref 8–23)
BUN: 16 mg/dL (ref 8–23)
BUN: 14 mg/dL (ref 8–23)
BUN: 15 mg/dL (ref 8–23)
Calcium, Ion: 1.23 mmol/L (ref 1.15–1.40)
Calcium, Ion: 1.25 mmol/L (ref 1.15–1.40)
Calcium, Ion: 1.03 mmol/L — ABNORMAL LOW (ref 1.15–1.40)
Calcium, Ion: 1.09 mmol/L — ABNORMAL LOW (ref 1.15–1.40)
Chloride: 105 mmol/L (ref 98–111)
Chloride: 105 mmol/L (ref 98–111)
Chloride: 100 mmol/L (ref 98–111)
Chloride: 105 mmol/L (ref 98–111)
Creatinine, Ser: 1.1 mg/dL (ref 0.61–1.24)
Creatinine, Ser: 1.1 mg/dL (ref 0.61–1.24)
Creatinine, Ser: 0.9 mg/dL (ref 0.61–1.24)
Creatinine, Ser: 1 mg/dL (ref 0.61–1.24)
Glucose, Bld: 106 mg/dL — ABNORMAL HIGH (ref 70–99)
Glucose, Bld: 105 mg/dL — ABNORMAL HIGH (ref 70–99)
Glucose, Bld: 120 mg/dL — ABNORMAL HIGH (ref 70–99)
Glucose, Bld: 170 mg/dL — ABNORMAL HIGH (ref 70–99)
HCT: 43 % (ref 39.0–52.0)
HCT: 43 % (ref 39.0–52.0)
HCT: 33 % — ABNORMAL LOW (ref 39.0–52.0)
HCT: 38 % — ABNORMAL LOW (ref 39.0–52.0)
Hemoglobin: 14.6 g/dL (ref 13.0–17.0)
Hemoglobin: 14.6 g/dL (ref 13.0–17.0)
Hemoglobin: 11.2 g/dL — ABNORMAL LOW (ref 13.0–17.0)
Hemoglobin: 12.9 g/dL — ABNORMAL LOW (ref 13.0–17.0)
Potassium: 3.3 mmol/L — ABNORMAL LOW (ref 3.5–5.1)
Potassium: 3.4 mmol/L — ABNORMAL LOW (ref 3.5–5.1)
Potassium: 4.2 mmol/L (ref 3.5–5.1)
Potassium: 4.1 mmol/L (ref 3.5–5.1)
Sodium: 143 mmol/L (ref 135–145)
Sodium: 142 mmol/L (ref 135–145)
Sodium: 135 mmol/L (ref 135–145)
Sodium: 139 mmol/L (ref 135–145)
TCO2: 26 mmol/L (ref 22–32)
TCO2: 23 mmol/L (ref 22–32)
TCO2: 24 mmol/L (ref 22–32)
TCO2: 23 mmol/L (ref 22–32)

## 2022-05-07 LAB — HEMOGLOBIN AND HEMATOCRIT, BLOOD
HCT: 35.1 % — ABNORMAL LOW (ref 39.0–52.0)
Hemoglobin: 12.1 g/dL — ABNORMAL LOW (ref 13.0–17.0)

## 2022-05-07 LAB — CBC
HCT: 41.7 % (ref 39.0–52.0)
HCT: 38 % — ABNORMAL LOW (ref 39.0–52.0)
Hemoglobin: 14.4 g/dL (ref 13.0–17.0)
Hemoglobin: 13 g/dL (ref 13.0–17.0)
MCH: 32.4 pg (ref 26.0–34.0)
MCH: 32.3 pg (ref 26.0–34.0)
MCHC: 34.5 g/dL (ref 30.0–36.0)
MCHC: 34.2 g/dL (ref 30.0–36.0)
MCV: 93.9 fL (ref 80.0–100.0)
MCV: 94.5 fL (ref 80.0–100.0)
Platelets: 95 10*3/uL — ABNORMAL LOW (ref 150–400)
Platelets: 80 10*3/uL — ABNORMAL LOW (ref 150–400)
RBC: 4.44 MIL/uL (ref 4.22–5.81)
RBC: 4.02 MIL/uL — ABNORMAL LOW (ref 4.22–5.81)
RDW: 14.4 % (ref 11.5–15.5)
RDW: 14.5 % (ref 11.5–15.5)
WBC: 16.4 10*3/uL — ABNORMAL HIGH (ref 4.0–10.5)
WBC: 12.4 10*3/uL — ABNORMAL HIGH (ref 4.0–10.5)
nRBC: 0 % (ref 0.0–0.2)
nRBC: 0 % (ref 0.0–0.2)

## 2022-05-07 LAB — POCT I-STAT EG7
Acid-Base Excess: 7 mmol/L — ABNORMAL HIGH (ref 0.0–2.0)
Bicarbonate: 33.1 mmol/L — ABNORMAL HIGH (ref 20.0–28.0)
Calcium, Ion: 1.04 mmol/L — ABNORMAL LOW (ref 1.15–1.40)
HCT: 34 % — ABNORMAL LOW (ref 39.0–52.0)
Hemoglobin: 11.6 g/dL — ABNORMAL LOW (ref 13.0–17.0)
O2 Saturation: 87 %
Potassium: 3.4 mmol/L — ABNORMAL LOW (ref 3.5–5.1)
Sodium: 149 mmol/L — ABNORMAL HIGH (ref 135–145)
TCO2: 35 mmol/L — ABNORMAL HIGH (ref 22–32)
pCO2, Ven: 56.4 mmHg (ref 44–60)
pH, Ven: 7.376 (ref 7.25–7.43)
pO2, Ven: 55 mmHg — ABNORMAL HIGH (ref 32–45)

## 2022-05-07 LAB — BASIC METABOLIC PANEL
Anion gap: 7 (ref 5–15)
BUN: 15 mg/dL (ref 8–23)
CO2: 21 mmol/L — ABNORMAL LOW (ref 22–32)
Calcium: 7.7 mg/dL — ABNORMAL LOW (ref 8.9–10.3)
Chloride: 111 mmol/L (ref 98–111)
Creatinine, Ser: 1.03 mg/dL (ref 0.61–1.24)
GFR, Estimated: >60 mL/min (ref >60)
Glucose, Bld: 125 mg/dL — ABNORMAL HIGH (ref 70–99)
Potassium: 4.5 mmol/L (ref 3.5–5.1)
Sodium: 139 mmol/L (ref 135–145)

## 2022-05-07 LAB — APTT: aPTT: 32 seconds (ref 24–36)

## 2022-05-07 LAB — PLATELET COUNT: Platelets: 113 10*3/uL — ABNORMAL LOW (ref 150–400)

## 2022-05-07 LAB — GLUCOSE, CAPILLARY
Glucose-Capillary: 139 mg/dL — ABNORMAL HIGH (ref 70–99)
Glucose-Capillary: 161 mg/dL — ABNORMAL HIGH (ref 70–99)
Glucose-Capillary: 132 mg/dL — ABNORMAL HIGH (ref 70–99)
Glucose-Capillary: 127 mg/dL — ABNORMAL HIGH (ref 70–99)
Glucose-Capillary: 125 mg/dL — ABNORMAL HIGH (ref 70–99)
Glucose-Capillary: 122 mg/dL — ABNORMAL HIGH (ref 70–99)
Glucose-Capillary: 136 mg/dL — ABNORMAL HIGH (ref 70–99)

## 2022-05-07 LAB — PROTIME-INR
INR: 1.4 — ABNORMAL HIGH (ref 0.8–1.2)
Prothrombin Time: 16.8 seconds — ABNORMAL HIGH (ref 11.4–15.2)

## 2022-05-07 LAB — ECHO INTRAOPERATIVE TEE
AV Mean grad: 3 mmHg
Height: 68 in
Weight: 3680 oz

## 2022-05-07 LAB — ABO/RH: ABO/RH(D): A POS

## 2022-05-07 LAB — MAGNESIUM: Magnesium: 2.9 mg/dL — ABNORMAL HIGH (ref 1.7–2.4)

## 2022-05-07 SURGERY — CORONARY ARTERY BYPASS GRAFTING (CABG)
Anesthesia: General | Site: Chest

## 2022-05-07 MED ORDER — PROTAMINE SULFATE 10 MG/ML IV SOLN
INTRAVENOUS | Status: DC | PRN
  Administered 2022-05-07: 370 mg via INTRAVENOUS

## 2022-05-07 MED ORDER — FENTANYL CITRATE (PF) 250 MCG/5ML IJ SOLN
INTRAMUSCULAR | Status: AC
  Filled 2022-05-07: qty 5

## 2022-05-07 MED ORDER — FENTANYL CITRATE (PF) 250 MCG/5ML IJ SOLN
INTRAMUSCULAR | Status: DC | PRN
  Administered 2022-05-07: 700 ug via INTRAVENOUS
  Administered 2022-05-07 (×2): 100 ug via INTRAVENOUS
  Administered 2022-05-07: 150 ug via INTRAVENOUS
  Administered 2022-05-07: 50 ug via INTRAVENOUS
  Administered 2022-05-07: 100 ug via INTRAVENOUS
  Administered 2022-05-07: 50 ug via INTRAVENOUS
  Administered 2022-05-07: 100 ug via INTRAVENOUS
  Administered 2022-05-07: 50 ug via INTRAVENOUS

## 2022-05-07 MED ORDER — SODIUM CHLORIDE 0.9 % IV SOLN
250.0000 mL | INTRAVENOUS | Status: DC
  Administered 2022-05-08: 250 mL via INTRAVENOUS

## 2022-05-07 MED ORDER — SODIUM CHLORIDE 0.9% FLUSH
10.0000 mL | Freq: Two times a day (BID) | INTRAVENOUS | Status: DC
  Administered 2022-05-07: 10 mL

## 2022-05-07 MED ORDER — ALBUMIN HUMAN 5 % IV SOLN
250.0000 mL | INTRAVENOUS | Status: AC | PRN
  Administered 2022-05-07 (×4): 12.5 g via INTRAVENOUS
  Filled 2022-05-07 (×2): qty 250

## 2022-05-07 MED ORDER — CHLORHEXIDINE GLUCONATE 4 % EX LIQD: 30.0000 mL | CUTANEOUS | Status: DC

## 2022-05-07 MED ORDER — SODIUM CHLORIDE (PF) 0.9 % IJ SOLN
OROMUCOSAL | Status: DC | PRN
  Administered 2022-05-07 (×3): 4 mL via TOPICAL

## 2022-05-07 MED ORDER — POTASSIUM CHLORIDE 10 MEQ/50ML IV SOLN
10.0000 meq | Freq: Once | INTRAVENOUS | Status: AC
Start: 2022-05-07 — End: 2022-05-07
  Administered 2022-05-07: 10 meq via INTRAVENOUS

## 2022-05-07 MED ORDER — MIDAZOLAM HCL (PF) 5 MG/ML IJ SOLN
INTRAMUSCULAR | Status: DC | PRN
  Administered 2022-05-07 (×3): 1 mg via INTRAVENOUS
  Administered 2022-05-07: 2 mg via INTRAVENOUS

## 2022-05-07 MED ORDER — ORAL CARE MOUTH RINSE: 15.0000 mL | Freq: Once | OROMUCOSAL | Status: AC

## 2022-05-07 MED ORDER — ALBUMIN HUMAN 5 % IV SOLN: INTRAVENOUS | Status: DC | PRN

## 2022-05-07 MED ORDER — ACETAMINOPHEN 650 MG RE SUPP: 650.0000 mg | Freq: Once | RECTAL | Status: AC

## 2022-05-07 MED ORDER — PROPOFOL 10 MG/ML IV BOLUS
INTRAVENOUS | Status: DC | PRN
  Administered 2022-05-07: 20 mg via INTRAVENOUS

## 2022-05-07 MED ORDER — ACETAMINOPHEN 500 MG PO TABS
1000.0000 mg | ORAL_TABLET | Freq: Four times a day (QID) | ORAL | Status: AC
  Administered 2022-05-07 – 2022-05-12 (×15): 1000 mg via ORAL
  Filled 2022-05-07 (×17): qty 2

## 2022-05-07 MED ORDER — HEPARIN SODIUM (PORCINE) 1000 UNIT/ML IJ SOLN
INTRAMUSCULAR | Status: DC | PRN
  Administered 2022-05-07: 2000 [IU] via INTRAVENOUS
  Administered 2022-05-07: 35000 [IU] via INTRAVENOUS

## 2022-05-07 MED ORDER — PANTOPRAZOLE SODIUM 40 MG PO TBEC
40.0000 mg | DELAYED_RELEASE_TABLET | Freq: Every day | ORAL | Status: DC
  Administered 2022-05-09 – 2022-05-17 (×9): 40 mg via ORAL
  Filled 2022-05-07 (×9): qty 1

## 2022-05-07 MED ORDER — MAGNESIUM SULFATE 4 GM/100ML IV SOLN
4.0000 g | Freq: Once | INTRAVENOUS | Status: AC
  Administered 2022-05-07: 4 g via INTRAVENOUS
  Filled 2022-05-07: qty 100

## 2022-05-07 MED ORDER — CHLORHEXIDINE GLUCONATE CLOTH 2 % EX PADS
6.0000 | MEDICATED_PAD | Freq: Every day | CUTANEOUS | Status: DC
Start: 2022-05-07 — End: 2022-05-08

## 2022-05-07 MED ORDER — ACETAMINOPHEN 160 MG/5ML PO SOLN
650.0000 mg | Freq: Once | ORAL | Status: AC
  Administered 2022-05-07: 650 mg

## 2022-05-07 MED ORDER — SODIUM CHLORIDE 0.9 % IV SOLN: INTRAVENOUS | Status: DC | PRN

## 2022-05-07 MED ORDER — 0.9 % SODIUM CHLORIDE (POUR BTL) OPTIME
TOPICAL | Status: DC | PRN
  Administered 2022-05-07: 5000 mL

## 2022-05-07 MED ORDER — PHENYLEPHRINE 80 MCG/ML (10ML) SYRINGE FOR IV PUSH (FOR BLOOD PRESSURE SUPPORT)
PREFILLED_SYRINGE | INTRAVENOUS | Status: AC
  Filled 2022-05-07: qty 10

## 2022-05-07 MED ORDER — CHLORHEXIDINE GLUCONATE 0.12 % MT SOLN
15.0000 mL | Freq: Once | OROMUCOSAL | Status: AC
  Administered 2022-05-07: 15 mL via OROMUCOSAL
  Filled 2022-05-07: qty 15

## 2022-05-07 MED ORDER — SODIUM CHLORIDE 0.9% FLUSH
3.0000 mL | Freq: Two times a day (BID) | INTRAVENOUS | Status: DC
  Administered 2022-05-08 (×2): 3 mL via INTRAVENOUS

## 2022-05-07 MED ORDER — POTASSIUM CHLORIDE 10 MEQ/50ML IV SOLN
10.0000 meq | INTRAVENOUS | Status: AC
  Administered 2022-05-07 (×2): 10 meq via INTRAVENOUS

## 2022-05-07 MED ORDER — METOPROLOL TARTRATE 12.5 MG HALF TABLET: 12.5000 mg | ORAL_TABLET | Freq: Once | ORAL | Status: DC

## 2022-05-07 MED ORDER — ASPIRIN 325 MG PO TBEC
325.0000 mg | DELAYED_RELEASE_TABLET | Freq: Every day | ORAL | Status: DC
  Administered 2022-05-08 – 2022-05-12 (×5): 325 mg via ORAL
  Filled 2022-05-07 (×5): qty 1

## 2022-05-07 MED ORDER — LACTATED RINGERS IV SOLN: INTRAVENOUS | Status: DC | PRN

## 2022-05-07 MED ORDER — SODIUM CHLORIDE 0.9% FLUSH: 10.0000 mL | INTRAVENOUS | Status: DC | PRN

## 2022-05-07 MED ORDER — HEPARIN SODIUM (PORCINE) 1000 UNIT/ML IJ SOLN
INTRAMUSCULAR | Status: AC
Start: 2022-05-07 — End: ?
  Filled 2022-05-07: qty 1

## 2022-05-07 MED ORDER — DOCUSATE SODIUM 100 MG PO CAPS
200.0000 mg | ORAL_CAPSULE | Freq: Every day | ORAL | Status: DC
  Administered 2022-05-08: 200 mg via ORAL
  Filled 2022-05-07: qty 2

## 2022-05-07 MED ORDER — SODIUM BICARBONATE 8.4 % IV SOLN
100.0000 meq | Freq: Once | INTRAVENOUS | Status: AC
  Administered 2022-05-07: 100 meq via INTRAVENOUS

## 2022-05-07 MED ORDER — LACTATED RINGERS IV SOLN: INTRAVENOUS | Status: DC

## 2022-05-07 MED ORDER — TRAMADOL HCL 50 MG PO TABS
50.0000 mg | ORAL_TABLET | ORAL | Status: DC | PRN
  Administered 2022-05-09 – 2022-05-13 (×4): 100 mg via ORAL
  Filled 2022-05-07 (×4): qty 2

## 2022-05-07 MED ORDER — CEFAZOLIN SODIUM-DEXTROSE 2-4 GM/100ML-% IV SOLN
2.0000 g | Freq: Three times a day (TID) | INTRAVENOUS | Status: AC
  Administered 2022-05-07 – 2022-05-09 (×6): 2 g via INTRAVENOUS
  Filled 2022-05-07 (×6): qty 100

## 2022-05-07 MED ORDER — MORPHINE SULFATE (PF) 2 MG/ML IV SOLN
1.0000 mg | INTRAVENOUS | Status: DC | PRN
  Administered 2022-05-07 – 2022-05-08 (×2): 2 mg via INTRAVENOUS
  Filled 2022-05-07 (×2): qty 1

## 2022-05-07 MED ORDER — NITROGLYCERIN IN D5W 200-5 MCG/ML-% IV SOLN: 0.0000 ug/min | INTRAVENOUS | Status: DC

## 2022-05-07 MED ORDER — MIDAZOLAM HCL (PF) 10 MG/2ML IJ SOLN
INTRAMUSCULAR | Status: AC
  Filled 2022-05-07: qty 2

## 2022-05-07 MED ORDER — SODIUM CHLORIDE 0.45 % IV SOLN: INTRAVENOUS | Status: DC | PRN

## 2022-05-07 MED ORDER — METOPROLOL TARTRATE 12.5 MG HALF TABLET
12.5000 mg | ORAL_TABLET | Freq: Two times a day (BID) | ORAL | Status: DC
  Administered 2022-05-08 – 2022-05-09 (×2): 12.5 mg via ORAL
  Filled 2022-05-07 (×2): qty 1

## 2022-05-07 MED ORDER — ASPIRIN 81 MG PO CHEW: 324.0000 mg | CHEWABLE_TABLET | Freq: Every day | ORAL | Status: DC

## 2022-05-07 MED ORDER — DEXTROSE 50 % IV SOLN: 0.0000 mL | INTRAVENOUS | Status: DC | PRN

## 2022-05-07 MED ORDER — SODIUM CHLORIDE 0.9% FLUSH: 3.0000 mL | INTRAVENOUS | Status: DC | PRN

## 2022-05-07 MED ORDER — ROCURONIUM BROMIDE 10 MG/ML (PF) SYRINGE
PREFILLED_SYRINGE | INTRAVENOUS | Status: AC
  Filled 2022-05-07: qty 10

## 2022-05-07 MED ORDER — ACETAMINOPHEN 160 MG/5ML PO SOLN
1000.0000 mg | Freq: Four times a day (QID) | ORAL | Status: AC
  Administered 2022-05-08: 1000 mg
  Filled 2022-05-07: qty 40.6

## 2022-05-07 MED ORDER — CHLORHEXIDINE GLUCONATE CLOTH 2 % EX PADS
6.0000 | MEDICATED_PAD | Freq: Every day | CUTANEOUS | Status: DC
  Administered 2022-05-07 – 2022-05-11 (×5): 6 via TOPICAL

## 2022-05-07 MED ORDER — OXYCODONE HCL 5 MG PO TABS
5.0000 mg | ORAL_TABLET | ORAL | Status: DC | PRN
  Administered 2022-05-07 – 2022-05-08 (×3): 10 mg via ORAL
  Filled 2022-05-07 (×3): qty 2

## 2022-05-07 MED ORDER — METOPROLOL TARTRATE 25 MG/10 ML ORAL SUSPENSION
12.5000 mg | Freq: Two times a day (BID) | ORAL | Status: DC
  Filled 2022-05-07: qty 5

## 2022-05-07 MED ORDER — ONDANSETRON HCL 4 MG/2ML IJ SOLN
4.0000 mg | Freq: Four times a day (QID) | INTRAMUSCULAR | Status: DC | PRN
  Administered 2022-05-07 – 2022-05-09 (×2): 4 mg via INTRAVENOUS
  Filled 2022-05-07: qty 2

## 2022-05-07 MED ORDER — BISACODYL 5 MG PO TBEC
10.0000 mg | DELAYED_RELEASE_TABLET | Freq: Every day | ORAL | Status: DC
  Administered 2022-05-08 – 2022-05-16 (×6): 10 mg via ORAL
  Filled 2022-05-07 (×7): qty 2

## 2022-05-07 MED ORDER — PHENYLEPHRINE HCL-NACL 20-0.9 MG/250ML-% IV SOLN: 0.0000 ug/min | INTRAVENOUS | Status: DC

## 2022-05-07 MED ORDER — BISACODYL 10 MG RE SUPP
10.0000 mg | Freq: Every day | RECTAL | Status: DC
  Filled 2022-05-07: qty 1

## 2022-05-07 MED ORDER — PROPOFOL 10 MG/ML IV BOLUS
INTRAVENOUS | Status: AC
  Filled 2022-05-07: qty 20

## 2022-05-07 MED ORDER — CHLORHEXIDINE GLUCONATE 0.12 % MT SOLN: 15.0000 mL | Freq: Once | OROMUCOSAL | Status: DC

## 2022-05-07 MED ORDER — ROCURONIUM BROMIDE 10 MG/ML (PF) SYRINGE
PREFILLED_SYRINGE | INTRAVENOUS | Status: DC | PRN
  Administered 2022-05-07: 70 mg via INTRAVENOUS
  Administered 2022-05-07: 30 mg via INTRAVENOUS
  Administered 2022-05-07: 100 mg via INTRAVENOUS

## 2022-05-07 MED ORDER — LACTATED RINGERS IV SOLN: 500.0000 mL | Freq: Once | INTRAVENOUS | Status: DC | PRN

## 2022-05-07 MED ORDER — PHENYLEPHRINE 80 MCG/ML (10ML) SYRINGE FOR IV PUSH (FOR BLOOD PRESSURE SUPPORT)
PREFILLED_SYRINGE | INTRAVENOUS | Status: DC | PRN
  Administered 2022-05-07: 80 ug via INTRAVENOUS
  Administered 2022-05-07 (×3): 160 ug via INTRAVENOUS
  Administered 2022-05-07: 240 ug via INTRAVENOUS
  Administered 2022-05-07 (×3): 160 ug via INTRAVENOUS

## 2022-05-07 MED ORDER — DEXMEDETOMIDINE HCL IN NACL 400 MCG/100ML IV SOLN
0.0000 ug/kg/h | INTRAVENOUS | Status: DC
  Administered 2022-05-07: 0.7 ug/kg/h via INTRAVENOUS
  Filled 2022-05-07: qty 100

## 2022-05-07 MED ORDER — INSULIN REGULAR(HUMAN) IN NACL 100-0.9 UT/100ML-% IV SOLN
INTRAVENOUS | Status: DC
Start: 2022-05-07 — End: 2022-05-09
  Administered 2022-05-08: 0.5 [IU]/h via INTRAVENOUS

## 2022-05-07 MED ORDER — SODIUM CHLORIDE 0.9 % IV SOLN: INTRAVENOUS | Status: DC

## 2022-05-07 MED ORDER — CHLORHEXIDINE GLUCONATE 0.12 % MT SOLN
15.0000 mL | OROMUCOSAL | Status: AC
  Administered 2022-05-07: 15 mL via OROMUCOSAL
  Filled 2022-05-07: qty 15

## 2022-05-07 MED ORDER — MIDAZOLAM HCL 2 MG/2ML IJ SOLN: 2.0000 mg | INTRAMUSCULAR | Status: DC | PRN

## 2022-05-07 MED ORDER — HEPARIN SODIUM (PORCINE) 1000 UNIT/ML IJ SOLN
INTRAMUSCULAR | Status: AC
Start: 2022-05-07 — End: ?
  Filled 2022-05-07: qty 10

## 2022-05-07 MED ORDER — HEMOSTATIC AGENTS (NO CHARGE) OPTIME
TOPICAL | Status: DC | PRN
  Administered 2022-05-07 (×2): 1 via TOPICAL

## 2022-05-07 MED ORDER — METOPROLOL TARTRATE 5 MG/5ML IV SOLN
2.5000 mg | INTRAVENOUS | Status: DC | PRN
  Administered 2022-05-08 – 2022-05-11 (×6): 5 mg via INTRAVENOUS
  Administered 2022-05-12: 2.5 mg via INTRAVENOUS
  Filled 2022-05-07 (×9): qty 5

## 2022-05-07 MED ORDER — VANCOMYCIN HCL IN DEXTROSE 1-5 GM/200ML-% IV SOLN
1000.0000 mg | Freq: Once | INTRAVENOUS | Status: AC
  Administered 2022-05-07: 1000 mg via INTRAVENOUS
  Filled 2022-05-07: qty 200

## 2022-05-07 MED ORDER — ~~LOC~~ CARDIAC SURGERY, PATIENT & FAMILY EDUCATION
Freq: Once | Status: DC
  Filled 2022-05-07: qty 1

## 2022-05-07 MED ORDER — FAMOTIDINE IN NACL 20-0.9 MG/50ML-% IV SOLN
20.0000 mg | Freq: Two times a day (BID) | INTRAVENOUS | Status: AC
  Administered 2022-05-07: 20 mg via INTRAVENOUS
  Filled 2022-05-07 (×2): qty 50

## 2022-05-07 SURGICAL SUPPLY — 98 items
ADH SKN CLS APL DERMABOND .7 (GAUZE/BANDAGES/DRESSINGS) ×2
BAG DECANTER FOR FLEXI CONT (MISCELLANEOUS) ×3 IMPLANT
BLADE CLIPPER SURG (BLADE) ×3 IMPLANT
BLADE STERNUM SYSTEM 6 (BLADE) ×3 IMPLANT
BNDG CMPR MED 10X6 ELC LF (GAUZE/BANDAGES/DRESSINGS) ×2
BNDG ELASTIC 4X5.8 VLCR STR LF (GAUZE/BANDAGES/DRESSINGS) ×3 IMPLANT
BNDG ELASTIC 6X10 VLCR STRL LF (GAUZE/BANDAGES/DRESSINGS) ×1 IMPLANT
BNDG ELASTIC 6X5.8 VLCR STR LF (GAUZE/BANDAGES/DRESSINGS) ×3 IMPLANT
BNDG GAUZE DERMACEA FLUFF (GAUZE/BANDAGES/DRESSINGS) ×1
BNDG GAUZE DERMACEA FLUFF 4 (GAUZE/BANDAGES/DRESSINGS) IMPLANT
BNDG GAUZE ELAST 4 BULKY (GAUZE/BANDAGES/DRESSINGS) ×2 IMPLANT
BNDG GZE DERMACEA 4 6PLY (GAUZE/BANDAGES/DRESSINGS) ×2
CANISTER SUCT 3000ML PPV (MISCELLANEOUS) ×3 IMPLANT
CANNULA EZ GLIDE AORTIC 21FR (CANNULA) ×3 IMPLANT
CATH CPB KIT HENDRICKSON (MISCELLANEOUS) ×3 IMPLANT
CATH ROBINSON RED A/P 18FR (CATHETERS) ×3 IMPLANT
CATH THORACIC 36FR (CATHETERS) ×3 IMPLANT
CATH THORACIC 36FR RT ANG (CATHETERS) ×3 IMPLANT
CLIP TI WIDE RED SMALL 24 (CLIP) ×1 IMPLANT
CLIP VESOCCLUDE MED 24/CT (CLIP) IMPLANT
CLIP VESOCCLUDE SM WIDE 24/CT (CLIP) IMPLANT
CONTAINER PROTECT SURGISLUSH (MISCELLANEOUS) ×6 IMPLANT
DERMABOND ADVANCED (GAUZE/BANDAGES/DRESSINGS) ×1
DERMABOND ADVANCED .7 DNX12 (GAUZE/BANDAGES/DRESSINGS) IMPLANT
DRAPE CARDIOVASCULAR INCISE (DRAPES) ×3
DRAPE SRG 135X102X78XABS (DRAPES) ×2 IMPLANT
DRAPE WARM FLUID 44X44 (DRAPES) ×3 IMPLANT
DRSG COVADERM 4X14 (GAUZE/BANDAGES/DRESSINGS) ×3 IMPLANT
ELECT REM PT RETURN 9FT ADLT (ELECTROSURGICAL) ×6
ELECTRODE REM PT RTRN 9FT ADLT (ELECTROSURGICAL) ×4 IMPLANT
FELT TEFLON 1X6 (MISCELLANEOUS) ×5 IMPLANT
GAUZE 4X4 16PLY ~~LOC~~+RFID DBL (SPONGE) ×3 IMPLANT
GAUZE SPONGE 4X4 12PLY STRL (GAUZE/BANDAGES/DRESSINGS) ×6 IMPLANT
GLOVE BIO SURGEON STRL SZ 6.5 (GLOVE) ×3 IMPLANT
GLOVE SS BIOGEL STRL SZ 7.5 (GLOVE) IMPLANT
GLOVE SUPERSENSE BIOGEL SZ 7.5 (GLOVE) ×3
GLOVE SURG SIGNA 7.5 PF LTX (GLOVE) ×9 IMPLANT
GLOVE SURG SS PI 6.0 STRL IVOR (GLOVE) ×2 IMPLANT
GLOVE SURG SS PI 6.5 STRL IVOR (GLOVE) ×2 IMPLANT
GOWN STRL REUS W/ TWL LRG LVL3 (GOWN DISPOSABLE) ×8 IMPLANT
GOWN STRL REUS W/ TWL XL LVL3 (GOWN DISPOSABLE) ×4 IMPLANT
GOWN STRL REUS W/TWL LRG LVL3 (GOWN DISPOSABLE) ×24
GOWN STRL REUS W/TWL XL LVL3 (GOWN DISPOSABLE) ×6
HEMOSTAT POWDER SURGIFOAM 1G (HEMOSTASIS) ×10 IMPLANT
HEMOSTAT SURGICEL .5X2 ABSORB (HEMOSTASIS) IMPLANT
HEMOSTAT SURGICEL 2X14 (HEMOSTASIS) ×3 IMPLANT
INSERT FOGARTY XLG (MISCELLANEOUS) IMPLANT
KIT BASIN OR (CUSTOM PROCEDURE TRAY) ×3 IMPLANT
KIT CATH SUCT 8FR (CATHETERS) ×1 IMPLANT
KIT SUCTION CATH 14FR (SUCTIONS) ×6 IMPLANT
KIT TURNOVER KIT B (KITS) ×3 IMPLANT
KIT VASOVIEW HEMOPRO 2 VH 4000 (KITS) ×3 IMPLANT
MARKER GRAFT CORONARY BYPASS (MISCELLANEOUS) ×9 IMPLANT
NS IRRIG 1000ML POUR BTL (IV SOLUTION) ×15 IMPLANT
PACK E OPEN HEART (SUTURE) ×3 IMPLANT
PACK OPEN HEART (CUSTOM PROCEDURE TRAY) ×3 IMPLANT
PAD ARMBOARD 7.5X6 YLW CONV (MISCELLANEOUS) ×6 IMPLANT
PAD ELECT DEFIB RADIOL ZOLL (MISCELLANEOUS) ×3 IMPLANT
PENCIL BUTTON HOLSTER BLD 10FT (ELECTRODE) ×3 IMPLANT
POSITIONER HEAD DONUT 9IN (MISCELLANEOUS) ×3 IMPLANT
PUNCH AORTIC ROTATE 4.0MM (MISCELLANEOUS) ×1 IMPLANT
PUNCH AORTIC ROTATE 4.5MM 8IN (MISCELLANEOUS) IMPLANT
PUNCH AORTIC ROTATE 5MM 8IN (MISCELLANEOUS) IMPLANT
SET MPS 3-ND DEL (MISCELLANEOUS) ×1 IMPLANT
SPONGE T-LAP 18X18 ~~LOC~~+RFID (SPONGE) ×12 IMPLANT
SPONGE T-LAP 4X18 ~~LOC~~+RFID (SPONGE) ×3 IMPLANT
SUPPORT HEART JANKE-BARRON (MISCELLANEOUS) ×3 IMPLANT
SUT BONE WAX W31G (SUTURE) ×3 IMPLANT
SUT MNCRL AB 4-0 PS2 18 (SUTURE) IMPLANT
SUT PROLENE 3 0 SH DA (SUTURE) ×3 IMPLANT
SUT PROLENE 4 0 RB 1 (SUTURE) ×6
SUT PROLENE 4 0 SH DA (SUTURE) IMPLANT
SUT PROLENE 4-0 RB1 .5 CRCL 36 (SUTURE) IMPLANT
SUT PROLENE 6 0 C 1 30 (SUTURE) ×8 IMPLANT
SUT PROLENE 7 0 BV1 MDA (SUTURE) ×4 IMPLANT
SUT PROLENE 8 0 BV175 6 (SUTURE) ×1 IMPLANT
SUT STEEL 6MS V (SUTURE) ×3 IMPLANT
SUT STEEL STERNAL CCS#1 18IN (SUTURE) IMPLANT
SUT STEEL SZ 6 DBL 3X14 BALL (SUTURE) ×3 IMPLANT
SUT VIC AB 1 CTX 36 (SUTURE) ×6
SUT VIC AB 1 CTX36XBRD ANBCTR (SUTURE) ×4 IMPLANT
SUT VIC AB 2-0 CT1 27 (SUTURE) ×3
SUT VIC AB 2-0 CT1 TAPERPNT 27 (SUTURE) IMPLANT
SUT VIC AB 2-0 CTX 27 (SUTURE) IMPLANT
SUT VIC AB 3-0 SH 27 (SUTURE)
SUT VIC AB 3-0 SH 27X BRD (SUTURE) IMPLANT
SUT VIC AB 3-0 X1 27 (SUTURE) ×1 IMPLANT
SUT VICRYL 4-0 PS2 18IN ABS (SUTURE) IMPLANT
SYSTEM SAHARA CHEST DRAIN ATS (WOUND CARE) ×3 IMPLANT
TAPE CLOTH 4X10 WHT NS (GAUZE/BANDAGES/DRESSINGS) ×1 IMPLANT
TAPE PAPER 2X10 WHT MICROPORE (GAUZE/BANDAGES/DRESSINGS) ×1 IMPLANT
TOWEL GREEN STERILE (TOWEL DISPOSABLE) ×3 IMPLANT
TOWEL GREEN STERILE FF (TOWEL DISPOSABLE) ×3 IMPLANT
TRAY FOLEY SLVR 16FR TEMP STAT (SET/KITS/TRAYS/PACK) ×3 IMPLANT
TUBE SUCTION CARDIAC 10FR (CANNULA) ×1 IMPLANT
TUBING LAP HI FLOW INSUFFLATIO (TUBING) ×3 IMPLANT
UNDERPAD 30X36 HEAVY ABSORB (UNDERPADS AND DIAPERS) ×3 IMPLANT
WATER STERILE IRR 1000ML POUR (IV SOLUTION) ×6 IMPLANT

## 2022-05-07 NOTE — Anesthesia Procedure Notes (Signed)
Central Venous Catheter Insertion Performed by: Annye Asa, MD, anesthesiologist Start/End7/24/2023 6:54 AM, 05/07/2022 7:09 AM Preanesthetic checklist: patient identified, IV checked, risks and benefits discussed, surgical consent, monitors and equipment checked, pre-op evaluation, timeout performed and anesthesia consent Position: Trendelenburg Lidocaine 1% used for infiltration and patient sedated Hand hygiene performed , maximum sterile barriers used  and Seldinger technique used Catheter size: 8.5 Fr PA cath was placed.Sheath introducer Swan type:thermodilution Procedure performed using ultrasound guided technique. Ultrasound Notes:anatomy identified, needle tip was noted to be adjacent to the nerve/plexus identified, no ultrasound evidence of intravascular and/or intraneural injection and image(s) printed for medical record Attempts: 1 Following insertion, line sutured, dressing applied and Biopatch. Post procedure assessment: blood return through all ports, free fluid flow and no air  Patient tolerated the procedure well with no immediate complications. Additional procedure comments: PA catheter:  Routine monitors. Timeout, sterile prep, drape, FBP R neck.  Trendelenburg position.  1% Lido local, finder and trocar RIJ 1st pass with US guidance.  Cordis placed over J wire. PA catheter in easily.  Sterile dressing applied.  Patient tolerated well, VSS.  Jenita Seashore, MD .

## 2022-05-07 NOTE — Procedures (Signed)
Extubation Procedure Note  Patient Details:   Name: Luis Deleon DOB: 1940/12/02 MRN: [id number]   Airway Documentation:  RT extubated patient to 4L [place]. Patient able to get NIF -20 and VC 750. Cuff leak present. No stridor noted at this time. RN at beside doing IS with patient.    Vent end date: 05/07/22 Vent end time: 2210   Evaluation  O2 sats: stable throughout Complications: No apparent complications Patient did tolerate procedure well. Bilateral Breath Sounds: Clear, Diminished   Yes  Kelle Darting 05/07/2022, 2210

## 2022-05-07 NOTE — Progress Notes (Signed)
      [address]       [place] [address]             [phone]      S/p CABG x 3  Intubated, following commands but won't stay awake to wean yet  BP 108/72   Pulse 80   Temp 97.9 F (36.6 C)   Resp 13   Ht 5' 8"  (1.727 m)   Wt 104.3 kg   SpO2 99%   BMI 34.97 kg/m  CI= 1.95  Intake/Output Summary (Last 24 hours) at 05/07/2022 1814 Last data filed at 05/07/2022 1500 Gross per 24 hour  Intake 3043.29 ml  Output 1758 ml  Net 1285.29 ml   Thrombocytopenia but not much drainage from chest tubes  Doing well early postop  Remo Lipps C. Roxan Hockey, MD [facility] [phone]

## 2022-05-07 NOTE — Interval H&P Note (Signed)
History and Physical Interval Note:  05/07/2022 7:20 AM  Luis Deleon  has presented today for surgery, with the diagnosis of coronary artery disease.  The various methods of treatment have been discussed with the patient and family. After consideration of risks, benefits and other options for treatment, the patient has consented to  Procedure(s): CORONARY ARTERY BYPASS GRAFTING (CABG) (N/A) TRANSESOPHAGEAL ECHOCARDIOGRAM (TEE) (N/A) as a surgical intervention.  The patient's history has been reviewed, patient examined, no change in status, stable for surgery.  I have reviewed the patient's chart and labs.  Questions were answered to the patient's satisfaction.     Melrose Nakayama

## 2022-05-07 NOTE — Hospital Course (Addendum)
  HPI: Luis Deleon is sent for consultation regarding two-vessel coronary disease.  Luis Deleon is a 81 year old man with a past history significant for hypertension, hyperlipidemia, Crohn's disease, multiple abdominal surgeries, prostate cancer with radical prostatectomy, stage III chronic kidney disease, remote tobacco abuse, and paroxysmal supraventricular tachycardia.  His primary complaint is palpitations.  He has been having those for a couple of years.  He describes as his heart beating very strongly.  His heart rate would usually be around 90 if he checked it.  On multiple occasions EMS was called and EKG did not show any ischemia.  It would usually last about an hour or 2 and then would resolve.  He was having that with increased frequency.  He had an event monitor for 30 days and did not have an episode.  He had a recurrent episode the day after the event monitor was removed.  He says he is not having any chest pain, pressure, or tightness.  He does get short of breath with exertion.  Also complains of dizziness particularly in the mornings and when he bends over and then stands up quickly.   Dr. Gwenlyn Found did a coronary CT which showed a coronary calcium score of greater than 3000.  He then underwent cardiac catheterization which revealed severe two-vessel coronary disease.  History of Crohn's disease with intermittent abdominal pain and prior surgeries.  About once every 4 to 5 months he will have to take Dilaudid for pain.  Does have swelling in his legs.  Worsened after he stopped hydrochlorothiazide.  That medication has been resumed.  Dr. Roxan Hockey evaluated the patient and all relevant studies and recommended proceeding with CABG.  He was scheduled to be admitted as outpatient on 05/07/2022.  Hospital course: The patient was electively admitted on 05/07/2022 taken the operating room at which time he underwent coronary bypass grafting x3.  He tolerated the procedure well was taken the surgical  intensive care unit in stable condition.  He was extubated overnight.  He was started on diuretics for volume overloaded state.  His chest tubes and arterial lines were removed without difficulty.  He developed Atrial Fibrillation with RVR.  He was treated with Amiodarone and converted to NSR with IV Amiodarone protocol.  He developed abdominal distention but had active bowel sounds.  He was treated with Reglan.  He was stable for transfer to the progressive care unit on 05/09/2022.  Patient developed wide complex tachycardia this morning 05/10/2022 and was bolused IV amiodarone and converted to a flutter with variable block. Cardiology consult was obtained. They performed and Echocardiogram which showed normal EF.  There was no evidence of valvular abnormalities or pericardial effusion.  There was evidence of RV pressure/volume overload.  He was placed on IV Amiodarone.  He converted to NSR on 7/28 and was converted to oral Amiodarone on 7/29.  His pacing wires were removed without difficulty.  He developed a coughing fit and was treated with nebulizer treatment.  He has been weaned off oxygen.  His surgical incisions are healing without evidence of infection.  He is medically stable for discharge home today.

## 2022-05-07 NOTE — Brief Op Note (Signed)
05/07/2022  8:13 AM  PATIENT:  Luis Deleon  81 y.o. male  PRE-OPERATIVE DIAGNOSIS:  coronary artery disease  POST-OPERATIVE DIAGNOSIS:  coronary artery disease  PROCEDURE:  Procedure(s): CORONARY ARTERY BYPASS GRAFTING (CABG) X 3 , USING LEFT INTERNAL MAMMARY ARTERY AND RIGHT LEG GREATER SAPHENOUS VEIN HARVESTED ENDOSCOPICALLY (N/A) TRANSESOPHAGEAL ECHOCARDIOGRAM (TEE) (N/A) Vein harvest time: 68mn Vein prep time: 144m  SURGEON:  Surgeon(s) and Role:    * HeMelrose NakayamaMD - Primary  PHYSICIAN ASSISTANT: Kosisochukwu Goldberg PA-C, BAILY STEHLER PA-C  ASSISTANTS: RNFA   ANESTHESIA:   general  EBL:  388 mL   BLOOD ADMINISTERED:none  DRAINS:  LEFT PLEURAL AND MEDIASTINAL CHEST TUBES    LOCAL MEDICATIONS USED:  NONE  SPECIMEN:  No Specimen  DISPOSITION OF SPECIMEN:  N/A  COUNTS:  YES  TOURNIQUET:  * No tourniquets in log *  DICTATION: .Other Dictation: Dictation Number PENDING  PLAN OF CARE: Admit to inpatient   PATIENT DISPOSITION:  ICU - intubated and hemodynamically stable.   Delay start of Pharmacological VTE agent (>24hrs) due to surgical blood loss or risk of bleeding: yes  COMPLICATIONS: NO KNOWN

## 2022-05-07 NOTE — Transfer of Care (Signed)
Immediate Anesthesia Transfer of Care Note  Patient: MAJOUR FREI  Procedure(s) Performed: CORONARY ARTERY BYPASS GRAFTING (CABG) X 3 , USING LEFT INTERNAL MAMMARY ARTERY AND RIGHT LEG GREATER SAPHENOUS VEIN HARVESTED ENDOSCOPICALLY (Chest) TRANSESOPHAGEAL ECHOCARDIOGRAM (TEE)  Patient Location: ICU  Anesthesia Type:General  Level of Consciousness: Patient remains intubated per anesthesia plan  Airway & Oxygen Therapy: Patient remains intubated per anesthesia plan and Patient placed on Ventilator (see vital sign flow sheet for setting)  Post-op Assessment: Report given to RN and Post -op Vital signs reviewed and stable  Post vital signs: Reviewed and stable  Last Vitals:  Vitals Value Taken Time  BP    Temp 35.9 C 05/07/22 1320  Pulse 80 05/07/22 1320  Resp 20 05/07/22 1320  SpO2 96 % 05/07/22 1320  Vitals shown include unvalidated device data.  Last Pain:  Vitals:   05/07/22 0547  TempSrc: Oral  PainSc: 0-No pain         Complications: No notable events documented.

## 2022-05-07 NOTE — Anesthesia Procedure Notes (Addendum)
Arterial Line Insertion Start/End7/24/2023 7:00 AM, 05/07/2022 7:08 AM Performed by: Annye Asa, MD, Janace Litten, CRNA, CRNA  Preanesthetic checklist: patient identified, IV checked, site marked, risks and benefits discussed, surgical consent, monitors and equipment checked, pre-op evaluation, timeout performed and anesthesia consent Lidocaine 1% used for infiltration and patient sedated Left, radial was placed Catheter size: 18 G Hand hygiene performed , maximum sterile barriers used  and Seldinger technique used  Attempts: 2 Procedure performed without using ultrasound guided technique. Ultrasound Notes:anatomy identified, needle tip was noted to be adjacent to the nerve/plexus identified and no ultrasound evidence of intravascular and/or intraneural injection Following insertion, dressing applied and Biopatch. Post procedure assessment: normal and unchanged  Patient tolerated the procedure well with no immediate complications.

## 2022-05-07 NOTE — Anesthesia Postprocedure Evaluation (Signed)
Anesthesia Post Note  Patient: Luis Deleon  Procedure(s) Performed: CORONARY ARTERY BYPASS GRAFTING (CABG) X 3 , USING LEFT INTERNAL MAMMARY ARTERY AND RIGHT LEG GREATER SAPHENOUS VEIN HARVESTED ENDOSCOPICALLY (Chest) TRANSESOPHAGEAL ECHOCARDIOGRAM (TEE)     Patient location during evaluation: SICU Anesthesia Type: General Level of consciousness: patient remains intubated per anesthesia plan and sedated Pain management: pain level controlled Vital Signs Assessment: post-procedure vital signs reviewed and stable Respiratory status: patient remains intubated per anesthesia plan and patient on ventilator - see flowsheet for VS (weaning from vent presently) Cardiovascular status: stable and blood pressure returned to baseline Postop Assessment: no apparent nausea or vomiting Anesthetic complications: no   No notable events documented.  Last Vitals:  Vitals:   05/07/22 1415 05/07/22 1430  BP: 111/74 96/69  Pulse: 79 80  Resp: 19 12  Temp: (!) 36.2 C (!) 36.4 C  SpO2: 99% 99%    Last Pain:  Vitals:   05/07/22 1330  TempSrc: Core  PainSc: 0-No pain                 Yavier Snider,E. Kross Swallows

## 2022-05-07 NOTE — Progress Notes (Signed)
  Echocardiogram Echocardiogram Transesophageal has been performed.  Luis Deleon 05/07/2022, 8:28 AM

## 2022-05-07 NOTE — Anesthesia Procedure Notes (Signed)
Procedure Name: Intubation Date/Time: 05/07/2022 8:02 AM  Performed by: Janace Litten, CRNAPre-anesthesia Checklist: Patient identified, Emergency Drugs available, Suction available and Patient being monitored Patient Re-evaluated:Patient Re-evaluated prior to induction Oxygen Delivery Method: Circle System Utilized Preoxygenation: Pre-oxygenation with 100% oxygen Induction Type: IV induction Ventilation: Mask ventilation without difficulty and Oral airway inserted - appropriate to patient size Laryngoscope Size: Mac and 4 Grade View: Grade I Tube type: Oral Tube size: 8.0 mm Number of attempts: 1 Airway Equipment and Method: Stylet and Oral airway Placement Confirmation: ETT inserted through vocal cords under direct vision, positive ETCO2 and breath sounds checked- equal and bilateral Secured at: 23 cm Tube secured with: Tape Dental Injury: Teeth and Oropharynx as per pre-operative assessment  Comments: Lauren Cozart, SRNA placed ETT under supervision.

## 2022-05-08 ENCOUNTER — Inpatient Hospital Stay (HOSPITAL_COMMUNITY): Payer: Medicare HMO

## 2022-05-08 ENCOUNTER — Encounter (HOSPITAL_COMMUNITY): Payer: Self-pay | Admitting: Thoracic Surgery (Cardiothoracic Vascular Surgery)

## 2022-05-08 LAB — GLUCOSE, CAPILLARY
Glucose-Capillary: 106 mg/dL — ABNORMAL HIGH (ref 70–99)
Glucose-Capillary: 109 mg/dL — ABNORMAL HIGH (ref 70–99)
Glucose-Capillary: 115 mg/dL — ABNORMAL HIGH (ref 70–99)
Glucose-Capillary: 115 mg/dL — ABNORMAL HIGH (ref 70–99)
Glucose-Capillary: 117 mg/dL — ABNORMAL HIGH (ref 70–99)
Glucose-Capillary: 124 mg/dL — ABNORMAL HIGH (ref 70–99)
Glucose-Capillary: 126 mg/dL — ABNORMAL HIGH (ref 70–99)
Glucose-Capillary: 129 mg/dL — ABNORMAL HIGH (ref 70–99)
Glucose-Capillary: 130 mg/dL — ABNORMAL HIGH (ref 70–99)
Glucose-Capillary: 131 mg/dL — ABNORMAL HIGH (ref 70–99)
Glucose-Capillary: 132 mg/dL — ABNORMAL HIGH (ref 70–99)

## 2022-05-08 LAB — BASIC METABOLIC PANEL
Anion gap: 7 (ref 5–15)
Anion gap: 9 (ref 5–15)
BUN: 14 mg/dL (ref 8–23)
BUN: 15 mg/dL (ref 8–23)
CO2: 24 mmol/L (ref 22–32)
CO2: 25 mmol/L (ref 22–32)
Calcium: 7.6 mg/dL — ABNORMAL LOW (ref 8.9–10.3)
Calcium: 7.9 mg/dL — ABNORMAL LOW (ref 8.9–10.3)
Chloride: 110 mmol/L (ref 98–111)
Chloride: 106 mmol/L (ref 98–111)
Creatinine, Ser: 1.19 mg/dL (ref 0.61–1.24)
Creatinine, Ser: 1.2 mg/dL (ref 0.61–1.24)
GFR, Estimated: >60 mL/min (ref >60)
GFR, Estimated: >60 mL/min (ref >60)
Glucose, Bld: 117 mg/dL — ABNORMAL HIGH (ref 70–99)
Glucose, Bld: 132 mg/dL — ABNORMAL HIGH (ref 70–99)
Potassium: 3.7 mmol/L (ref 3.5–5.1)
Potassium: 3.7 mmol/L (ref 3.5–5.1)
Sodium: 141 mmol/L (ref 135–145)
Sodium: 140 mmol/L (ref 135–145)

## 2022-05-08 LAB — CBC
HCT: 35.3 % — ABNORMAL LOW (ref 39.0–52.0)
HCT: 35.5 % — ABNORMAL LOW (ref 39.0–52.0)
Hemoglobin: 12.1 g/dL — ABNORMAL LOW (ref 13.0–17.0)
Hemoglobin: 12.1 g/dL — ABNORMAL LOW (ref 13.0–17.0)
MCH: 32.4 pg (ref 26.0–34.0)
MCH: 32.8 pg (ref 26.0–34.0)
MCHC: 34.3 g/dL (ref 30.0–36.0)
MCHC: 34.1 g/dL (ref 30.0–36.0)
MCV: 94.6 fL (ref 80.0–100.0)
MCV: 96.2 fL (ref 80.0–100.0)
Platelets: 88 10*3/uL — ABNORMAL LOW (ref 150–400)
Platelets: 73 10*3/uL — ABNORMAL LOW (ref 150–400)
RBC: 3.73 MIL/uL — ABNORMAL LOW (ref 4.22–5.81)
RBC: 3.69 MIL/uL — ABNORMAL LOW (ref 4.22–5.81)
RDW: 14.6 % (ref 11.5–15.5)
RDW: 14.9 % (ref 11.5–15.5)
WBC: 13.9 10*3/uL — ABNORMAL HIGH (ref 4.0–10.5)
WBC: 12 10*3/uL — ABNORMAL HIGH (ref 4.0–10.5)
nRBC: 0 % (ref 0.0–0.2)
nRBC: 0 % (ref 0.0–0.2)

## 2022-05-08 LAB — POCT I-STAT 7, (LYTES, BLD GAS, ICA,H+H)
Acid-Base Excess: 0 mmol/L (ref 0.0–2.0)
Bicarbonate: 25.2 mmol/L (ref 20.0–28.0)
Calcium, Ion: 1.16 mmol/L (ref 1.15–1.40)
HCT: 34 % — ABNORMAL LOW (ref 39.0–52.0)
Hemoglobin: 11.6 g/dL — ABNORMAL LOW (ref 13.0–17.0)
O2 Saturation: 97 %
Patient temperature: 37.3
Potassium: 3.7 mmol/L (ref 3.5–5.1)
Sodium: 143 mmol/L (ref 135–145)
TCO2: 26 mmol/L (ref 22–32)
pCO2 arterial: 44.9 mmHg (ref 32–48)
pH, Arterial: 7.358 (ref 7.35–7.45)
pO2, Arterial: 92 mmHg (ref 83–108)

## 2022-05-08 LAB — TSH: TSH: 0.978 u[IU]/mL (ref 0.350–4.500)

## 2022-05-08 LAB — MAGNESIUM
Magnesium: 2.5 mg/dL — ABNORMAL HIGH (ref 1.7–2.4)
Magnesium: 2.3 mg/dL (ref 1.7–2.4)

## 2022-05-08 MED ORDER — NYSTATIN-TRIAMCINOLONE 100000-0.1 UNIT/GM-% EX CREA: 1.0000 | TOPICAL_CREAM | Freq: Two times a day (BID) | CUTANEOUS | Status: DC | PRN

## 2022-05-08 MED ORDER — POTASSIUM CHLORIDE 10 MEQ/50ML IV SOLN
10.0000 meq | INTRAVENOUS | Status: AC
  Administered 2022-05-08 (×3): 10 meq via INTRAVENOUS
  Filled 2022-05-08 (×3): qty 50

## 2022-05-08 MED ORDER — BACLOFEN 10 MG PO TABS: 5.0000 mg | ORAL_TABLET | Freq: Every evening | ORAL | Status: DC | PRN

## 2022-05-08 MED ORDER — AMIODARONE LOAD VIA INFUSION
150.0000 mg | Freq: Once | INTRAVENOUS | Status: AC
  Administered 2022-05-08: 150 mg via INTRAVENOUS
  Filled 2022-05-08: qty 83.34

## 2022-05-08 MED ORDER — GEMFIBROZIL 600 MG PO TABS
600.0000 mg | ORAL_TABLET | ORAL | Status: DC
  Administered 2022-05-09 – 2022-05-16 (×4): 600 mg via ORAL
  Filled 2022-05-08 (×4): qty 1

## 2022-05-08 MED ORDER — AMIODARONE HCL IN DEXTROSE 360-4.14 MG/200ML-% IV SOLN
30.0000 mg/h | INTRAVENOUS | Status: AC
  Administered 2022-05-08 – 2022-05-09 (×2): 30 mg/h via INTRAVENOUS
  Filled 2022-05-08: qty 200

## 2022-05-08 MED ORDER — ALLOPURINOL 100 MG PO TABS
100.0000 mg | ORAL_TABLET | Freq: Two times a day (BID) | ORAL | Status: DC
  Administered 2022-05-08 – 2022-05-17 (×19): 100 mg via ORAL
  Filled 2022-05-08 (×19): qty 1

## 2022-05-08 MED ORDER — AMIODARONE HCL IN DEXTROSE 360-4.14 MG/200ML-% IV SOLN
60.0000 mg/h | INTRAVENOUS | Status: DC
Start: 2022-05-08 — End: 2022-05-09
  Administered 2022-05-08: 60 mg/h via INTRAVENOUS

## 2022-05-08 MED ORDER — INSULIN ASPART 100 UNIT/ML IJ SOLN
0.0000 [IU] | INTRAMUSCULAR | Status: DC
  Administered 2022-05-08 – 2022-05-09 (×3): 2 [IU] via SUBCUTANEOUS

## 2022-05-08 MED ORDER — AMIODARONE HCL IN DEXTROSE 360-4.14 MG/200ML-% IV SOLN
INTRAVENOUS | Status: AC
  Administered 2022-05-08: 60 mg/h via INTRAVENOUS
  Filled 2022-05-08: qty 200

## 2022-05-08 MED ORDER — ALBUMIN HUMAN 5 % IV SOLN
12.5000 g | Freq: Once | INTRAVENOUS | Status: AC
  Administered 2022-05-08: 12.5 g via INTRAVENOUS
  Filled 2022-05-08: qty 250

## 2022-05-08 MED ORDER — POTASSIUM CHLORIDE CRYS ER 20 MEQ PO TBCR
20.0000 meq | EXTENDED_RELEASE_TABLET | ORAL | Status: AC
  Administered 2022-05-08 (×3): 20 meq via ORAL
  Filled 2022-05-08 (×3): qty 1

## 2022-05-08 MED ORDER — FUROSEMIDE 10 MG/ML IJ SOLN
40.0000 mg | Freq: Once | INTRAMUSCULAR | Status: AC
  Administered 2022-05-08: 40 mg via INTRAVENOUS
  Filled 2022-05-08: qty 4

## 2022-05-08 MED ORDER — ATORVASTATIN CALCIUM 40 MG PO TABS
40.0000 mg | ORAL_TABLET | Freq: Every day | ORAL | Status: DC
  Administered 2022-05-08 – 2022-05-17 (×10): 40 mg via ORAL
  Filled 2022-05-08 (×10): qty 1

## 2022-05-08 NOTE — Progress Notes (Signed)
Pt transferred from chair to bed (2 assist). As soon as pt sat in bed, pt went into several Vtach rhythms. And SVT.  MD notified, new orders are in. Amio bolus and gtt started. Pt's symptom is feeling flushed. Other wise just tired, as prior.

## 2022-05-08 NOTE — Discharge Instructions (Addendum)
Discharge Instructions:  1. You may shower, please wash incisions daily with soap and water and keep dry.  If you wish to cover wounds with dressing you may do so but please keep clean and change daily.  No tub baths or swimming until incisions have completely healed.  If your incisions become red or develop any drainage please call our office at [phone]  2. No Driving until cleared by Dr. Leonarda Salon office and you are no longer using narcotic pain medications  3. Monitor your weight daily.. Please use the same scale and weigh at same time... If you gain 5-10 lbs in 48 hours with associated lower extremity swelling, please contact our office at [phone]  4. Fever of 101.5 for at least 24 hours with no source, please contact our office at [phone]  5. Activity- up as tolerated, please walk at least 3 times per day.  Avoid strenuous activity, no lifting, pushing, or pulling with your arms over 8-10 lbs for a minimum of 6 weeks  6. If any questions or concerns arise, please do not hesitate to contact our office at [phone]   Information on my medicine - ELIQUIS (apixaban)  Why was Eliquis prescribed for you? Eliquis was prescribed for you to reduce the risk of a blood clot forming that can cause a stroke if you have a medical condition called atrial fibrillation (a type of irregular heartbeat).  What do You need to know about Eliquis ? Take your Eliquis TWICE DAILY - one tablet in the morning and one tablet in the evening with or without food. If you have difficulty swallowing the tablet whole please discuss with your pharmacist how to take the medication safely.  Take Eliquis exactly as prescribed by your doctor and DO NOT stop taking Eliquis without talking to the doctor who prescribed the medication.  Stopping may increase your risk of developing a stroke.  Refill your prescription before you run out.  After discharge, you should have regular check-up appointments  with your healthcare provider that is prescribing your Eliquis.  In the future your dose may need to be changed if your kidney function or weight changes by a significant amount or as you get older.  What do you do if you miss a dose? If you miss a dose, take it as soon as you remember on the same day and resume taking twice daily.  Do not take more than one dose of ELIQUIS at the same time to make up a missed dose.  Important Safety Information A possible side effect of Eliquis is bleeding. You should call your healthcare provider right away if you experience any of the following: Bleeding from an injury or your nose that does not stop. Unusual colored urine (red or dark brown) or unusual colored stools (red or black). Unusual bruising for unknown reasons. A serious fall or if you hit your head (even if there is no bleeding).  Some medicines may interact with Eliquis and might increase your risk of bleeding or clotting while on Eliquis. To help avoid this, consult your healthcare provider or pharmacist prior to using any new prescription or non-prescription medications, including herbals, vitamins, non-steroidal anti-inflammatory drugs (NSAIDs) and supplements.  This website has more information on Eliquis (apixaban): http://www.eliquis.com/eliquis/home

## 2022-05-08 NOTE — Op Note (Signed)
Luis Deleon, GEISEL MEDICAL RECORD NO: [id number] ACCOUNT NO: [id number] DATE OF BIRTH: 1941/05/30 FACILITY: [facility] LOCATION: [facility] PHYSICIAN: Revonda Standard. Roxan Hockey, MD  Operative Report   DATE OF PROCEDURE: 05/07/2022  PREOPERATIVE DIAGNOSIS:  Severe two-vessel coronary artery disease.  POSTOPERATIVE DIAGNOSIS:  Severe two-vessel coronary artery disease.  PROCEDURES PERFORMED:   Median sternotomy, extracorporeal circulation, Coronary artery bypass grafting x 3  Left internal mammary artery to LAD, Saphenous vein graft to first diagonal, Saphenous vein graft to posterior descending,  Endoscopic vein harvest, right thigh.  SURGEON:  Revonda Standard. Roxan Hockey, MD  ASSISTANT:  Jadene Pierini, PA and Wynelle Beckmann, [place].  ANESTHESIA:  General.  FINDINGS:  Vein fair quality.  Mammary good quality.  Good quality targets.  Preserved left ventricular function with no significant valvular pathology by transesophageal echocardiography.  CLINICAL NOTE:  Luis Deleon is a 81 year old man with multiple cardiac risk factors, who presented with palpitations.  He had a high coronary calcium score.  Coronary angiography revealed severe 2-vessel coronary artery disease.  He had normal left  ventricular function.  He was advised to undergo coronary artery bypass grafting.  The indications, risks, benefits, and alternatives were discussed in detail with the patient.  He understood and accepted the risks and agreed to proceed.  OPERATIVE NOTE:  Luis Deleon was brought to the preoperative holding area on 05/07/2022.  Anesthesia placed a Swan-Ganz catheter and an arterial blood pressure monitoring line.  He was taken to the operating room and anesthetized and intubated.  A Foley  catheter was placed.  Intravenous antibiotics were administered.  Dr. Glennon Mac performed transesophageal echocardiography, which showed normal left ventricular systolic function, no significant valvular pathology.  The chest, abdomen and  legs were prepped  and draped in the usual sterile fashion.  A timeout was performed.  Incision was made in the medial aspect of the right leg at the level of the knee.  The greater saphenous vein was identified.  It was harvested endoscopically from the right thigh. Saphenous vein was of adequate caliber and fair  quality. Simultaneously with the vein harvest, a median sternotomy was performed and the left internal mammary artery was harvested using standard technique. 2000 units of heparin was administered during the vessel harvest.  The remainder of the full  heparin dose was given prior to opening the pericardium.  Sternal retractor was placed and was opened gradually. The pericardium was opened.  The ascending aorta was inspected.  It was of normal caliber with no evidence of atherosclerotic disease.  After confirming adequate anticoagulation with ACT measurement,  the aorta was cannulated via concentric 2-0 Ethibond pledgeted pursestring sutures.  A dual stage venous cannula was placed via a pursestring suture in the right atrial appendage.  Cardiopulmonary bypass was initiated.  Flows were maintained per  protocol.  The patient was cooled to 32 degrees Celsius.  The coronary arteries were inspected and anastomotic sites were chosen.  The conduits were inspected and cut to length.  A foam pad was placed in the pericardium to insulate the heart.  A  temperature probe was placed in the myocardial septum and a cardioplegia cannula was placed in the ascending aorta.  The aorta was cross clamped.  The left ventricle was emptied via the aortic root vent.  Cardiac arrest then was achieved with a combination of cold antegrade blood cardioplegia and topical iced saline.  One liter of cardioplegia was administered.  There  was a rapid diastolic arrest and there was septal cooling to  11 degrees Celsius.  A reversed saphenous vein graft was placed end-to-side to the posterior descending branch of the right  coronary.  This was a 1.5 mm good quality target.  The vein was fair quality.  It was anastomosed end-to-side with a running 7-0 Prolene suture.  A probe  passed easily distally at the completion of the anastomosis.  Cardioplegia was administered and there was good flow and good hemostasis.  Next, a reversed saphenous vein graft was placed end-to-side to the first diagonal branch of the LAD.  There was a complex stenosis at the bifurcation of the LAD and diagonal.  The diagonal was a 1.5 mm vessel at the site of the anastomosis, the vein  again was fair quality.  It was anastomosed end-to-side with a running 7-0 Prolene suture and again, a probe passed easily distally at the completion of the anastomosis.  With cardioplegia administration, there was good flow through the graft and good  hemostasis at the anastomosis.  Additional cardioplegia was administered via the aortic root.  The left internal mammary artery was brought through a window in the pericardium.  The distal end was bevelled.  It was anastomosed end-to-side to the distal LAD.  The LAD was a 1.5 mm good  quality target.  The mammary was a 1.5 mm good quality conduit.  The end-to-side anastomosis was performed with a running 8-0 Prolene suture.  At the completion of the anastomosis, the bulldog clamp was removed.  Septal rewarming was noted.  The bulldog  clamp was replaced.  The mammary pedicle was tacked to the epicardial surface of the heart with 6-0 Prolene sutures.  Additional cardioplegia was administered.  The vein grafts were cut to length.  The proximal vein graft anastomoses were performed to 4.5 mm punch aortotomies with running 6-0 Prolene sutures.  At the completion of the final proximal anastomosis, the  patient was placed in Trendelenburg position.  Lidocaine was administered.  The bulldog clamp was again removed from the mammary artery.  De-airing was performed and the aortic crossclamp was removed.  The total crossclamp time  was 65 minutes.  While rewarming was completed, all proximal and distal anastomoses were inspected for hemostasis.  Epicardial pacing wires were placed on the right ventricle and right atrium.  DDD pacing was initiated.  When the patient had rewarmed to a core  temperature of 37 degrees Celsius, he was weaned from cardiopulmonary bypass on the first attempt. The total bypass time was 100 minutes.  Initial cardiac index was greater than 2 liters per minute per meter squared.  Post-bypass transesophageal  echocardiography showed some septal dyskinesis with DDD pacing, which subsequently resolved and had normal left ventricular function with atrial pacing later on during the procedure.  A test dose of protamine was administered.  Initially, there was no reaction.  After approximately half the protamine had been administered, there was a significant systemic hypotension without pulmonary hypertension. With volume resuscitation, the  systemic hypotension resolved and there was elevated pulmonary artery pressures.  The atrial and aortic cannulae were removed.  The remainder of the protamine was administered very slowly and the patient remained hemodynamically stable and subsequently  had return of his pulmonary artery pressures to normal.  The chest was copiously irrigated with warm saline.  Hemostasis was achieved.  Left pleural and mediastinal chest tubes were placed through separate subcostal incisions.  The sternum was closed  with a combination of single and double heavy gauge stainless steel wires.  Pectoralis fascia, subcutaneous  tissue and skin were closed in standard fashion.  All sponge, needle and instrument counts were correct at the end of the procedure.  The patient  was taken from the operating room to the surgical intensive care unit, intubated and in good condition.  Experienced assistance was necessary for this case due to surgical complexity.  Jadene Pierini, [place] and [place], [place] provided  assistance, harvesting the vein endoscopically independently and then with retraction of delicate tissues, exposure, suture  management, and suctioning during the anastomoses and they also assisted with wound closure.   [facility] D: 05/07/2022 5:43:46 pm T: 05/08/2022 12:22:00 am  JOB: [id number]/ [id number]

## 2022-05-08 NOTE — Progress Notes (Signed)
1 Day Post-Op Procedure(s) (LRB): CORONARY ARTERY BYPASS GRAFTING (CABG) X 3 , USING LEFT INTERNAL MAMMARY ARTERY AND RIGHT LEG GREATER SAPHENOUS VEIN HARVESTED ENDOSCOPICALLY (N/A) TRANSESOPHAGEAL ECHOCARDIOGRAM (TEE) (N/A) Subjective: No complaints, denies pain/ nausea  Objective: Vital signs in last 24 hours: Temp:  [96.6 F (35.9 C)-100 F (37.8 C)] 98.8 F (37.1 C) (07/25 0700) Pulse Rate:  [69-80] 80 (07/25 0700) Cardiac Rhythm: Atrial paced (07/25 0400) Resp:  [0-27] 24 (07/25 0700) BP: (93-157)/(54-104) 95/62 (07/25 0600) SpO2:  [95 %-100 %] 98 % (07/25 0700) Arterial Line BP: (96-178)/(44-96) 132/57 (07/25 0700) FiO2 (%):  [40 %-50 %] 40 % (07/24 2128) Weight:  [110.5 kg] 110.5 kg (07/25 0500)  Hemodynamic parameters for last 24 hours: PAP: (21-40)/(10-20) 40/20 CO:  [3.3 L/min-6 L/min] 6 L/min CI:  [1.5 L/min/m2-2.7 L/min/m2] 2.7 L/min/m2  Intake/Output from previous day: 07/24 0701 - 07/25 0700 In: 5427.6 [P.O.:300; I.V.:3199.4; Blood:278; IV Piggyback:1650.2] Out: 2928 [Urine:2020; Emesis/NG output:50; Blood:388; Chest Tube:470] Intake/Output this shift: No intake/output data recorded.  General appearance: alert, cooperative, and no distress Neurologic: intact Heart: regular rate and rhythm Lungs: diminished breath sounds bibasilar Abdomen: mildly distended, nontender, + BS  Lab Results: Recent Labs    05/07/22 2000 05/07/22 2203 05/08/22 0300 05/08/22 0308  WBC 12.4*  --  13.9*  --   HGB 13.0   < > 12.1* 11.6*  HCT 38.0*   < > 35.3* 34.0*  PLT 80*  --  88*  --    < > = values in this interval not displayed.   BMET:  Recent Labs    05/07/22 2000 05/07/22 2203 05/08/22 0300 05/08/22 0308  NA 139   < > 141 143  K 4.5   < > 3.7 3.7  CL 111  --  110  --   CO2 21*  --  24  --   GLUCOSE 125*  --  117*  --   BUN 15  --  14  --   CREATININE 1.03  --  1.19  --   CALCIUM 7.7*  --  7.6*  --    < > = values in this interval not displayed.     PT/INR:  Recent Labs    05/07/22 1336  LABPROT 16.8*  INR 1.4*   ABG    Component Value Date/Time   PHART 7.358 05/08/2022 0308   HCO3 25.2 05/08/2022 0308   TCO2 26 05/08/2022 0308   ACIDBASEDEF 6.0 (H) 05/07/2022 2302   O2SAT 97 05/08/2022 0308   CBG (last 3)  Recent Labs    05/08/22 0500 05/08/22 0606 05/08/22 0700  GLUCAP 109* 106* 124*    Assessment/Plan: S/P Procedure(s) (LRB): CORONARY ARTERY BYPASS GRAFTING (CABG) X 3 , USING LEFT INTERNAL MAMMARY ARTERY AND RIGHT LEG GREATER SAPHENOUS VEIN HARVESTED ENDOSCOPICALLY (N/A) TRANSESOPHAGEAL ECHOCARDIOGRAM (TEE) (N/A) POD # 1 Looks good NEURO- intact CV- in SR with good hemodynamics  Dc Swan and A line  ASA, metoprolol, statin RESP- IS for atelectasis RENAL- creatinine normal. Lytes OK  Diurese for volume overload GI- advance diet as tolerated ENDO- CBG well controlled Anemia secondary to ABL- monitor Thrombocytopenia- no significant bleeding, monitor SCD for DVT prophylaxis, no enoxaparin due to low PLT Dc chest tubes Mobilize   LOS: 1 day    Melrose Nakayama 05/08/2022

## 2022-05-08 NOTE — TOC Initial Note (Signed)
Transition of Care [facility]) - Initial/Assessment Note    Patient Details  Name: Luis Deleon MRN: [id number] Date of Birth: [birth year]  [facility] CM/SW Contact:    Bethena Roys, RN Phone Number: 05/08/2022, 3:09 PM  Clinical Narrative:  Patient presented from home- POD-1 CABG. Patient has family support. Previous Case Manager received updates from [place] that the agency will follow the patient outpatient for home health services. Case Manager will continue to monitor for additional transition of care needs.                 Expected Discharge Plan: [place] Barriers to Discharge: Continued Medical Work up  Expected Discharge Plan and Services Expected Discharge Plan: [place] In-house Referral: NA Discharge Planning Services: CM Consult Post Acute Care Choice: [place] arrangements for the past 2 months: Single Family Home                           HH Arranged: PT          Prior Living Arrangements/Services Living arrangements for the past 2 months: Single Family Home Lives with:: Spouse (has support of daughter.) Patient language and need for interpreter reviewed:: Yes Do you feel safe going back to the place where you live?: Yes      Need for Family Participation in Patient Care: Yes (Comment) Care giver support system in place?: Yes (comment)    Permission Sought/Granted Permission sought to share information with : Family Supports, Case Manager Permission granted to share information with : Yes, Verbal Permission Granted     Permission granted to share info w AGENCY: [facility]        Emotional Assessment        Alcohol / Substance Use: Not Applicable Psych Involvement: No (comment)  Admission diagnosis:  S/P CABG x 3 [Z95.1] Patient Active Problem List   Diagnosis Date Noted   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   CAD (coronary artery disease) 04/23/2022   Bilateral lower extremity edema  03/02/2022   PSVT (paroxysmal supraventricular tachycardia) ([place]) 05/20/2020   Atypical chest pain 12/19/2017   Abdominal aortic atherosclerosis ([place]) 46/65/9935   Metabolic syndrome 70/17/7939   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

## 2022-05-08 NOTE — Progress Notes (Signed)
  Amiodarone Drug - Drug Interaction Consult Note  Recommendations: No major drug interactions, multiple minor interactions. Monitor therapy closely.  Amiodarone is metabolized by the cytochrome P450 system and therefore has the potential to cause many drug interactions. Amiodarone has an average plasma half-life of 50 days (range 20 to 100 days).   There is potential for drug interactions to occur several weeks or months after stopping treatment and the onset of drug interactions may be slow after initiating amiodarone.   [x]  Statins: Increased risk of myopathy. Simvastatin- restrict dose to 53m daily. Other statins: counsel patients to report any muscle pain or weakness immediately.  [x]  Beta blockers: increased risk of bradycardia, AV block and myocardial depression. Sotalol - avoid concomitant use.   Thank YRandol Kern 05/08/2022 6:43 PM

## 2022-05-08 NOTE — Progress Notes (Signed)
EVENING ROUNDS NOTE :     [address]       [place] [address]             [phone]                 1 Day Post-Op Procedure(s) (LRB): CORONARY ARTERY BYPASS GRAFTING (CABG) X 3 , USING LEFT INTERNAL MAMMARY ARTERY AND RIGHT LEG GREATER SAPHENOUS VEIN HARVESTED ENDOSCOPICALLY (N/A) TRANSESOPHAGEAL ECHOCARDIOGRAM (TEE) (N/A)   Total Length of Stay:  LOS: 1 day  Events:   Afib with RVR this evening Amio protocol started Giving additional dose of metop    BP 115/70   Pulse (!) 124   Temp 98.5 F (36.9 C) (Oral)   Resp (!) 23   Ht 5' 8"  (1.727 m)   Wt 110.5 kg   SpO2 95%   BMI 37.04 kg/m   PAP: (23-40)/(11-21) 38/15 CO:  [4.2 L/min-6 L/min] 6 L/min CI:  [1.9 L/min/m2-2.7 L/min/m2] 2.7 L/min/m2  Vent Mode: SIMV;PRVC FiO2 (%):  [40 %] 40 % Set Rate:  [4 bmp-12 bmp] 4 bmp Vt Set:  [540 mL] 540 mL PEEP:  [5 cmH20] 5 cmH20   sodium chloride     sodium chloride 1 mL/hr at 05/08/22 1200   sodium chloride 10 mL/hr at 05/08/22 1200   amiodarone 60 mg/hr (05/08/22 1754)   Followed by   Derrill Memo ON 05/09/2022] amiodarone      ceFAZolin (ANCEF) IV 2 g (05/08/22 1500)   insulin Stopped (05/08/22 0906)   lactated ringers     lactated ringers     lactated ringers 10 mL/hr at 05/08/22 1631   nitroGLYCERIN Stopped (05/07/22 2315)   phenylephrine (NEO-SYNEPHRINE) Adult infusion Stopped (05/08/22 0919)    I/O last 3 completed shifts: In: 5427.6 [P.O.:300; I.V.:3199.4; Blood:278; IV Piggyback:1650.2] Out: 2928 [Urine:2020; Emesis/NG output:50; Blood:388; Chest Tube:470]      Latest Ref Rng & Units 05/08/2022    4:36 PM 05/08/2022    3:08 AM 05/08/2022    3:00 AM  CBC  WBC 4.0 - 10.5 K/uL 12.0   13.9   Hemoglobin 13.0 - 17.0 g/dL 12.1  11.6  12.1   Hematocrit 39.0 - 52.0 % 35.5  34.0  35.3   Platelets 150 - 400 K/uL 73   88        Latest Ref Rng & Units 05/08/2022    4:36 PM 05/08/2022    3:08 AM 05/08/2022    3:00 AM  BMP  Glucose 70 - 99 mg/dL 132    117   BUN 8 - 23 mg/dL 15   14   Creatinine 0.61 - 1.24 mg/dL 1.20   1.19   Sodium 135 - 145 mmol/L 140  143  141   Potassium 3.5 - 5.1 mmol/L 3.7  3.7  3.7   Chloride 98 - 111 mmol/L 106   110   CO2 22 - 32 mmol/L 25   24   Calcium 8.9 - 10.3 mg/dL 7.9   7.6     ABG    Component Value Date/Time   PHART 7.358 05/08/2022 0308   PCO2ART 44.9 05/08/2022 0308   PO2ART 92 05/08/2022 0308   HCO3 25.2 05/08/2022 0308   TCO2 26 05/08/2022 0308   ACIDBASEDEF 6.0 (H) 05/07/2022 2302   O2SAT 97 05/08/2022 0308       Melodie Bouillon, MD 05/08/2022 6:07 PM

## 2022-05-09 ENCOUNTER — Inpatient Hospital Stay (HOSPITAL_COMMUNITY): Payer: Medicare HMO

## 2022-05-09 LAB — BASIC METABOLIC PANEL
Anion gap: 8 (ref 5–15)
BUN: 17 mg/dL (ref 8–23)
CO2: 25 mmol/L (ref 22–32)
Calcium: 7.9 mg/dL — ABNORMAL LOW (ref 8.9–10.3)
Chloride: 105 mmol/L (ref 98–111)
Creatinine, Ser: 1.25 mg/dL — ABNORMAL HIGH (ref 0.61–1.24)
GFR, Estimated: 59 mL/min — ABNORMAL LOW (ref >60)
Glucose, Bld: 112 mg/dL — ABNORMAL HIGH (ref 70–99)
Potassium: 4 mmol/L (ref 3.5–5.1)
Sodium: 138 mmol/L (ref 135–145)

## 2022-05-09 LAB — CBC
HCT: 33.7 % — ABNORMAL LOW (ref 39.0–52.0)
Hemoglobin: 11.4 g/dL — ABNORMAL LOW (ref 13.0–17.0)
MCH: 32.9 pg (ref 26.0–34.0)
MCHC: 33.8 g/dL (ref 30.0–36.0)
MCV: 97.1 fL (ref 80.0–100.0)
Platelets: 67 10*3/uL — ABNORMAL LOW (ref 150–400)
RBC: 3.47 MIL/uL — ABNORMAL LOW (ref 4.22–5.81)
RDW: 15.3 % (ref 11.5–15.5)
WBC: 10.3 10*3/uL (ref 4.0–10.5)
nRBC: 0.7 % — ABNORMAL HIGH (ref 0.0–0.2)

## 2022-05-09 LAB — GLUCOSE, CAPILLARY
Glucose-Capillary: 100 mg/dL — ABNORMAL HIGH (ref 70–99)
Glucose-Capillary: 104 mg/dL — ABNORMAL HIGH (ref 70–99)
Glucose-Capillary: 96 mg/dL (ref 70–99)
Glucose-Capillary: 116 mg/dL — ABNORMAL HIGH (ref 70–99)
Glucose-Capillary: 105 mg/dL — ABNORMAL HIGH (ref 70–99)
Glucose-Capillary: 91 mg/dL (ref 70–99)

## 2022-05-09 LAB — TSH: TSH: 1.957 u[IU]/mL (ref 0.350–4.500)

## 2022-05-09 MED ORDER — MAGNESIUM HYDROXIDE 400 MG/5ML PO SUSP
30.0000 mL | Freq: Every day | ORAL | Status: DC | PRN
Start: 2022-05-09 — End: 2022-05-17

## 2022-05-09 MED ORDER — INSULIN ASPART 100 UNIT/ML IJ SOLN: 0.0000 [IU] | Freq: Three times a day (TID) | INTRAMUSCULAR | Status: DC

## 2022-05-09 MED ORDER — POTASSIUM CHLORIDE CRYS ER 10 MEQ PO TBCR
20.0000 meq | EXTENDED_RELEASE_TABLET | Freq: Every day | ORAL | Status: DC
  Administered 2022-05-09 – 2022-05-10 (×2): 20 meq via ORAL
  Filled 2022-05-09 (×2): qty 2

## 2022-05-09 MED ORDER — METOPROLOL TARTRATE 25 MG/10 ML ORAL SUSPENSION
25.0000 mg | Freq: Two times a day (BID) | ORAL | Status: DC
  Filled 2022-05-09 (×4): qty 10

## 2022-05-09 MED ORDER — SODIUM CHLORIDE 0.9% FLUSH
3.0000 mL | INTRAVENOUS | Status: DC | PRN
  Administered 2022-05-14 (×2): 3 mL via INTRAVENOUS

## 2022-05-09 MED ORDER — AMIODARONE HCL 200 MG PO TABS
400.0000 mg | ORAL_TABLET | Freq: Two times a day (BID) | ORAL | Status: DC
  Administered 2022-05-09 – 2022-05-13 (×9): 400 mg via ORAL
  Filled 2022-05-09 (×9): qty 2

## 2022-05-09 MED ORDER — ~~LOC~~ CARDIAC SURGERY, PATIENT & FAMILY EDUCATION: Freq: Once | Status: DC

## 2022-05-09 MED ORDER — AMIODARONE IV BOLUS ONLY 150 MG/100ML
150.0000 mg | Freq: Once | INTRAVENOUS | Status: AC
  Administered 2022-05-09: 150 mg via INTRAVENOUS
  Filled 2022-05-09: qty 100

## 2022-05-09 MED ORDER — ALUM & MAG HYDROXIDE-SIMETH 200-200-20 MG/5ML PO SUSP: 15.0000 mL | Freq: Four times a day (QID) | ORAL | Status: DC | PRN

## 2022-05-09 MED ORDER — SODIUM CHLORIDE 0.9 % IV SOLN: 250.0000 mL | INTRAVENOUS | Status: DC | PRN

## 2022-05-09 MED ORDER — METOCLOPRAMIDE HCL 5 MG/5ML PO SOLN
10.0000 mg | Freq: Three times a day (TID) | ORAL | Status: AC
  Administered 2022-05-09 (×2): 10 mg via ORAL
  Filled 2022-05-09 (×6): qty 10

## 2022-05-09 MED ORDER — METOPROLOL TARTRATE 25 MG PO TABS
25.0000 mg | ORAL_TABLET | Freq: Two times a day (BID) | ORAL | Status: DC
  Administered 2022-05-09 – 2022-05-10 (×3): 25 mg via ORAL
  Filled 2022-05-09 (×3): qty 1

## 2022-05-09 MED ORDER — SODIUM CHLORIDE 0.9% FLUSH
3.0000 mL | Freq: Two times a day (BID) | INTRAVENOUS | Status: DC
  Administered 2022-05-09 – 2022-05-17 (×12): 3 mL via INTRAVENOUS

## 2022-05-09 MED ORDER — FUROSEMIDE 40 MG PO TABS
40.0000 mg | ORAL_TABLET | Freq: Every day | ORAL | Status: DC
  Administered 2022-05-09 – 2022-05-16 (×8): 40 mg via ORAL
  Filled 2022-05-09 (×8): qty 1

## 2022-05-09 MED FILL — Sodium Chloride IV Soln 0.9%: INTRAVENOUS | Qty: 3000 | Status: AC

## 2022-05-09 MED FILL — Heparin Sodium (Porcine) Inj 1000 Unit/ML: INTRAMUSCULAR | Qty: 10 | Status: AC

## 2022-05-09 MED FILL — Heparin Sodium (Porcine) Inj 1000 Unit/ML: INTRAMUSCULAR | Qty: 2500 | Status: AC

## 2022-05-09 MED FILL — Mannitol IV Soln 20%: INTRAVENOUS | Qty: 500 | Status: AC

## 2022-05-09 MED FILL — Heparin Sodium (Porcine) Inj 1000 Unit/ML: Qty: 1000 | Status: AC

## 2022-05-09 MED FILL — Potassium Chloride Inj 2 mEq/ML: INTRAVENOUS | Qty: 40 | Status: AC

## 2022-05-09 MED FILL — Magnesium Sulfate Inj 50%: INTRAMUSCULAR | Qty: 10 | Status: AC

## 2022-05-09 MED FILL — Sodium Bicarbonate IV Soln 8.4%: INTRAVENOUS | Qty: 50 | Status: AC

## 2022-05-09 MED FILL — Lidocaine HCl Local Soln Prefilled Syringe 100 MG/5ML (2%): INTRAMUSCULAR | Qty: 5 | Status: AC

## 2022-05-09 MED FILL — Electrolyte-R (PH 7.4) Solution: INTRAVENOUS | Qty: 3000 | Status: AC

## 2022-05-09 NOTE — Progress Notes (Signed)
Seen pt from [phone], pt refused ambulation due to being tired from walking this morning. Pt would like for me to follow up in an hour. Spent time introducing myself and role.  Christen Bame 05/09/2022 1:30 PM

## 2022-05-09 NOTE — Progress Notes (Signed)
2 Days Post-Op Procedure(s) (LRB): CORONARY ARTERY BYPASS GRAFTING (CABG) X 3 , USING LEFT INTERNAL MAMMARY ARTERY AND RIGHT LEG GREATER SAPHENOUS VEIN HARVESTED ENDOSCOPICALLY (N/A) TRANSESOPHAGEAL ECHOCARDIOGRAM (TEE) (N/A) Subjective: Walked 160' this AM Some nausea, minimal pain  Objective: Vital signs in last 24 hours: Temp:  [97.9 F (36.6 C)-99.1 F (37.3 C)] 98.5 F (36.9 C) (07/26 0722) Pulse Rate:  [59-148] 66 (07/26 0615) Cardiac Rhythm: Normal sinus rhythm (07/25 2314) Resp:  [11-26] 16 (07/26 0615) BP: (81-134)/(47-73) 105/63 (07/26 0615) SpO2:  [93 %-99 %] 93 % (07/26 0615) Arterial Line BP: (106-137)/(47-58) 133/49 (07/25 1045) Weight:  [111.3 kg] 111.3 kg (07/26 0500)  Hemodynamic parameters for last 24 hours: PAP: (32-38)/(15-21) 38/15  Intake/Output from previous day: 07/25 0701 - 07/26 0700 In: 1756 [P.O.:480; I.V.:655; IV Piggyback:621] Out: 2155 [Urine:2155] Intake/Output this shift: No intake/output data recorded.  General appearance: alert, cooperative, and no distress Neurologic: intact Heart: regular rate and rhythm Lungs: diminished breath sounds bibasilar Abdomen: distended, nontender, + BS  Lab Results: Recent Labs    05/08/22 1636 05/09/22 0302  WBC 12.0* 10.3  HGB 12.1* 11.4*  HCT 35.5* 33.7*  PLT 73* 67*   BMET:  Recent Labs    05/08/22 1636 05/09/22 0302  NA 140 138  K 3.7 4.0  CL 106 105  CO2 25 25  GLUCOSE 132* 112*  BUN 15 17  CREATININE 1.20 1.25*  CALCIUM 7.9* 7.9*    PT/INR:  Recent Labs    05/07/22 1336  LABPROT 16.8*  INR 1.4*   ABG    Component Value Date/Time   PHART 7.358 05/08/2022 0308   HCO3 25.2 05/08/2022 0308   TCO2 26 05/08/2022 0308   ACIDBASEDEF 6.0 (H) 05/07/2022 2302   O2SAT 97 05/08/2022 0308   CBG (last 3)  Recent Labs    05/08/22 2348 05/09/22 0314 05/09/22 0719  GLUCAP 126* 100* 104*    Assessment/Plan: S/P Procedure(s) (LRB): CORONARY ARTERY BYPASS GRAFTING (CABG) X 3 ,  USING LEFT INTERNAL MAMMARY ARTERY AND RIGHT LEG GREATER SAPHENOUS VEIN HARVESTED ENDOSCOPICALLY (N/A) TRANSESOPHAGEAL ECHOCARDIOGRAM (TEE) (N/A) Plan for transfer to step-down: see transfer orders NEURO- intact CV- went into atrial fib yesterday evening- converted to SR with amio  Convert amiodarone to PO  Continue ASA, beta blocker, statin RESP- atelectasis on CXR- IS RENAL- creatinine up slightly, lytes ok  Diurese for fluid overload ENDO- CBG well controlled  Change SSI to [facility] and HS GI- distended but + BS, will add Reglan PO x 24 hours Thrombocytopenia- no bleeding, monitor SCD + ambulation for DVT prophylaxis,   No enoxaparin due to low PLT Anemia secondary to ABL- mild, follow DC central line and Foley   LOS: 2 days    Luis Deleon 05/09/2022

## 2022-05-09 NOTE — Plan of Care (Signed)
  Problem: Activity: Goal: Ability to return to baseline activity level will improve Outcome: Progressing   Problem: Cardiovascular: Goal: Ability to achieve and maintain adequate cardiovascular perfusion will improve Outcome: Progressing Goal: Vascular access site(s) Level 0-1 will be maintained Outcome: Progressing   Problem: Health Behavior/Discharge Planning: Goal: Ability to safely manage health-related needs after discharge will improve Outcome: Progressing   Problem: Education: Goal: Knowledge of General Education information will improve Description: Including pain rating scale, medication(s)/side effects and non-pharmacologic comfort measures Outcome: Progressing   Problem: Health Behavior/Discharge Planning: Goal: Ability to manage health-related needs will improve Outcome: Progressing   Problem: Clinical Measurements: Goal: Ability to maintain clinical measurements within normal limits will improve Outcome: Progressing Goal: Will remain free from infection Outcome: Progressing Goal: Diagnostic test results will improve Outcome: Progressing Goal: Respiratory complications will improve Outcome: Progressing Goal: Cardiovascular complication will be avoided Outcome: Progressing   Problem: Activity: Goal: Risk for activity intolerance will decrease Outcome: Progressing

## 2022-05-09 NOTE — Progress Notes (Signed)
Seen pt from [phone], pt refused ambulation due to being tired but requested to be moved from chair to bed. Pt transferred successfully with call bell in reach. Encouraged IS use 10x/hour.  Christen Bame 05/09/2022 2:52 PM

## 2022-05-09 NOTE — Progress Notes (Signed)
Pt had 6 beat run VT then HR increased to 140s-150s.PRN metoprolol administered. PA notified. New orders placed.   Raelyn Number, RN

## 2022-05-09 NOTE — Discharge Summary (Addendum)
[address]       [place] [address]             [phone]    Physician Discharge Summary  Patient ID: Luis Deleon MRN: [id number] DOB/AGE: 20-Aug-1941 81 y.o.  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   Hyperlipidemia 07/28/2008   Gout, unspecified 07/28/2008   HTN (hypertension) 07/28/2008   Crohn disease ([place]) 07/28/2008   DIVERTICULOSIS, COLON 07/28/2008   History of prostate cancer 07/28/2008   SMALL BOWEL OBSTRUCTION, HX OF 07/28/2008   NEPHROLITHIASIS, HX OF 07/28/2008   Discharge Diagnoses:  Patient Active Problem List   Diagnosis Date Noted   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   CAD (coronary artery disease) 04/23/2022   Bilateral lower extremity edema 03/02/2022   PSVT (paroxysmal supraventricular tachycardia) ([place]) 05/20/2020   Atypical chest pain 12/19/2017   Abdominal aortic atherosclerosis ([place]) 04/59/9774   Metabolic syndrome 14/23/9532   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   Hyperlipidemia 07/28/2008   Gout, unspecified 07/28/2008   HTN (hypertension) 07/28/2008   Crohn disease ([place]) 07/28/2008   DIVERTICULOSIS, COLON 07/28/2008   History of prostate cancer 07/28/2008   SMALL BOWEL OBSTRUCTION, HX OF 07/28/2008   NEPHROLITHIASIS, HX OF 07/28/2008   Discharged Condition: good    HPI: [name] is sent for consultation regarding two-vessel coronary disease.  Luis Deleon is a 81 year old man with a past history significant for hypertension, hyperlipidemia, Crohn's disease, multiple abdominal surgeries, prostate cancer with radical  prostatectomy, stage III chronic kidney disease, remote tobacco abuse, and paroxysmal supraventricular tachycardia.  His primary complaint is palpitations.  He has been having those for a couple of years.  He describes as his heart beating very strongly.  His heart rate would usually be around 90 if he checked it.  On multiple occasions EMS was called and EKG did not show any ischemia.  It would usually last about an hour or 2 and then would resolve.  He was having that with increased frequency.  He had an event monitor for 30 days and did not have an episode.  He had a recurrent episode the day after the event monitor was removed.  He says he is not having any chest pain, pressure, or tightness.  He does get short of breath with exertion.  Also complains of dizziness particularly in the mornings and when he bends over and then stands up quickly.   Dr. Gwenlyn Found did a coronary CT which showed a coronary calcium score of greater than 3000.  He then underwent cardiac catheterization which revealed severe two-vessel coronary disease.  History of Crohn's disease with intermittent abdominal pain and prior surgeries.  About once every 4 to 5 months he will have to take Dilaudid for pain.  Does have swelling in his legs.  Worsened after he stopped hydrochlorothiazide.  That medication has been resumed.  Dr. Roxan Hockey evaluated the patient and all relevant studies and recommended proceeding with CABG.  He was scheduled to be admitted as outpatient on 05/07/2022.  Hospital course: The patient was electively admitted on 05/07/2022 taken the operating room at which time he underwent coronary bypass grafting  x3.  He tolerated the procedure well was taken the surgical intensive care unit in stable condition.  He was extubated overnight.  He was started on diuretics for volume overloaded state.  His chest tubes and arterial lines were removed without difficulty.  He developed Atrial Fibrillation with RVR.  He was treated with  Amiodarone and converted to NSR with IV Amiodarone protocol.  He developed abdominal distention but had active bowel sounds.  He was treated with Reglan.  He was stable for transfer to the progressive care unit on 05/09/2022.  Patient developed wide complex tachycardia this morning 05/10/2022 and was bolused IV amiodarone and converted to a flutter with variable block. Cardiology consult was obtained. They performed and Echocardiogram which showed normal EF.  There was no evidence of valvular abnormalities or pericardial effusion.  There was evidence of RV pressure/volume overload.  He was placed on IV Amiodarone.  He converted to NSR on 7/28 and was converted to oral Amiodarone on 7/29.  His pacing wires were removed without difficulty.  He again converted into rapid Atrial Fibrillation.  EP was consulted and recommended increase of his Lopressor to 100 mg BID and resumption of IV Amiodarone.  They were hopeful he would convert to NSR.  He was started on Eliquis for stroke prophylaxis.  He developed a coughing fit and was treated with nebulizer treatment. He has been weaned off oxygen.  He had issues with constipation and was treated with Lactulose. The patient had a mild leukocytosis felt to be related to phlebitis of his RUE from IV site infiltration.  He was treated with Keflex without improvement.  Therefore he was transitioned to Levaquin.  He is ambulating without difficulty.  His surgical incisions are healing without evidence of infection.  He is medically stable for discharge home today.  Consults: None  Significant Diagnostic Studies: angiography:       Prox RCA lesion is 99% stenosed.   Prox RCA to Mid RCA lesion is 99% stenosed.   Mid RCA to Dist RCA lesion is 99% stenosed.   Dist RCA lesion is 99% stenosed.   Prox LAD to Mid LAD lesion is 95% stenosed.   1st Diag lesion is 95% stenosed.   Treatments: surgery:   DATE OF PROCEDURE: 05/07/2022   PREOPERATIVE DIAGNOSIS:  Severe two-vessel  coronary artery disease.   POSTOPERATIVE DIAGNOSIS:  Severe two-vessel coronary artery disease.   PROCEDURES PERFORMED:  Median sternotomy, extracorporeal circulation, coronary artery bypass grafting x 3 (left internal mammary artery to LAD, saphenous vein graft to first diagonal, saphenous vein graft to posterior descending), endoscopic vein  harvest, right thigh.   SURGEON:  Revonda Standard. Roxan Hockey, MD   ASSISTANT:  Jadene Pierini, PA and Wynelle Beckmann, [place].   Discharge Exam: Blood pressure 114/60, pulse 74, temperature 97.6 F (36.4 C), temperature source Oral, resp. rate 20, height 5' 8"  (1.727 m), weight 98.5 kg, SpO2 95 %.  General appearance: alert, cooperative, and no distress Heart: regular rate and rhythm Lungs: clear to auscultation bilaterally Abdomen: soft, non-tender; bowel sounds normal; no masses,  no organomegaly Extremities: edema trace Wound: clean and dry   Discharge Medications:  The patient has been discharged on:   1.Beta Blocker:  Yes [ X  ]                              No   [   ]  If No, reason:  2.Ace Inhibitor/ARB: Yes [   ]                                     No  [ x   ]                                     If No, reason: titration of BB  3.Statin:   Yes [ X  ]                  No  [   ]                  If No, reason:  4.Ecasa:  Yes  [ X  ]                  No   [   ]                  If No, reason:  Patient had ACS upon admission: No  Plavix/P2Y12 inhibitor: Yes [   ]                                      No  [ X  ]   Discharge Instructions     Amb Referral to Cardiac Rehabilitation   Complete by: As directed    Diagnosis: CABG   CABG X ___: 3   After initial evaluation and assessments completed: Virtual Based Care may be provided alone or in conjunction with Phase 2 Cardiac Rehab based on patient barriers.: Yes      Allergies as of 05/17/2022       Reactions   Colchicine Diarrhea, Nausea And Vomiting,  Other (See Comments)   REACTION: nausea, diarrhea and interactions with 41m   Protamine Other (See Comments)   Likely mild protamine reaction with hypotension during CABG 2023        Medication List     STOP taking these medications    hydrochlorothiazide 12.5 MG tablet Commonly known as: HYDRODIURIL   HYDROmorphone 2 MG tablet Commonly known as: DILAUDID   losartan 25 MG tablet Commonly known as: COZAAR       TAKE these medications    allopurinol 100 MG tablet Commonly known as: ZYLOPRIM Take 1 tablet (100 mg total) by mouth 2 (two) times daily.   amiodarone 200 MG tablet Commonly known as: PACERONE Take 1 tablet (200 mg total) by mouth daily.   amoxicillin-clavulanate 500-125 MG tablet Commonly known as: AUGMENTIN Take 1 tablet (500 mg total) by mouth every 8 (eight) hours.   apixaban 5 MG Tabs tablet Commonly known as: ELIQUIS Take 1 tablet (5 mg total) by mouth 2 (two) times daily.   aspirin EC 81 MG tablet Take 81 mg by mouth every evening.   atorvastatin 40 MG tablet Commonly known as: LIPITOR TAKE 1 TABLET BY MOUTH DAILY AT 6 PM.   baclofen 10 MG tablet Commonly known as: LIORESAL TAKE 1 TABLET BY MOUTH AT BEDTIME AS NEEDED FOR MUSCLE SPASMS.   gemfibrozil 600 MG tablet Commonly known as: LOPID Take 1 tablet (600 mg total) by mouth 3 (three) times a week. Monday , Wednesday, Friday   metoprolol tartrate  100 MG tablet Commonly known as: LOPRESSOR Take 1 tablet (100 mg total) by mouth 2 (two) times daily. What changed:  medication strength how much to take   nystatin-triamcinolone cream Commonly known as: MYCOLOG II Apply 1 application topically 2 (two) times daily. What changed:  when to take this reasons to take this   Omega-3 Fish Oil 1200 MG Caps Take 2,400 mg by mouth in the morning and at bedtime.   potassium chloride SA 20 MEQ tablet Commonly known as: Klor-Con M20 Take 2 tablets (40 mEq total) by mouth daily.   traMADol 50 MG  tablet Commonly known as: ULTRAM Take 1 tablet (50 mg total) by mouth every 6 (six) hours as needed for moderate pain.   Vitamin D3 125 MCG (5000 UT) Caps Take 5,000 Units by mouth See admin instructions. Take 1 tablet by mouth every day except on Sundays.        Follow-up Information     Deberah Pelton, NP Follow up on 06/08/2022.   Specialty: Cardiology Why: Appointment is at 9:15 Contact information: [address] [place] [address] [phone]         [facility] Follow up on 06/05/2022.   Specialty: Cardiothoracic Surgery Why: Appointment is at 2:30, Please get CXR at 2:00 at [facility] located on first floor of our office building Contact information: [place], [place]. Follow up.   Why: Latricia Heft)- TCTS office referral for [facility] protocol - they will contact you post discharge regarding scheduling Contact information: [address] [place] [address] [phone]         Sherran Needs, NP Follow up.   Specialties: Nurse Practitioner, Cardiology Why: Hospital follow-up in the [facility] scheduled for 05/23/2022 at 9:00am. This is located at the [facility] at [facility]. Entrance C. If this date/time does not work for you, please call our office to reschedule. Contact information: [place] [address] [phone]         [facility] Follow up on 05/23/2022.   Specialty: Cardiothoracic Surgery Why: Appointment is at 1:00 for wound check of RUE phlebitis Contact information: [place], [place] [phone]                Signed:  Ellwood Handler, PA-C  05/17/2022, 12:46 PM

## 2022-05-09 NOTE — Progress Notes (Signed)
Pt arrived to 4E from [place]. A&Ox4. CHG given. VSS. Pain 0/10. Oriented to room and call light in reach.   Raelyn Number, RN

## 2022-05-09 NOTE — Progress Notes (Signed)
Patient cont. To be in At. Fib. Rate 100-130's with occ. Runs of W.C.T. 3-12 beats at a time. Patient voices no complaints and does not feel irreg. Heart beats. V.S.S. Hannah with [facility] aware of irreg Heart beat and came and look at monitor. Cont. To monitor rhythm at this time.

## 2022-05-10 ENCOUNTER — Inpatient Hospital Stay (HOSPITAL_COMMUNITY): Payer: Medicare HMO

## 2022-05-10 DIAGNOSIS — I2510 Atherosclerotic heart disease of native coronary artery without angina pectoris: Secondary | ICD-10-CM

## 2022-05-10 DIAGNOSIS — I4729 Other ventricular tachycardia: Secondary | ICD-10-CM | POA: Diagnosis not present

## 2022-05-10 DIAGNOSIS — R000 Tachycardia, unspecified: Secondary | ICD-10-CM | POA: Diagnosis not present

## 2022-05-10 DIAGNOSIS — Z951 Presence of aortocoronary bypass graft: Secondary | ICD-10-CM

## 2022-05-10 LAB — CBC
HCT: 32.7 % — ABNORMAL LOW (ref 39.0–52.0)
Hemoglobin: 11.2 g/dL — ABNORMAL LOW (ref 13.0–17.0)
MCH: 32.5 pg (ref 26.0–34.0)
MCHC: 34.3 g/dL (ref 30.0–36.0)
MCV: 94.8 fL (ref 80.0–100.0)
Platelets: 82 10*3/uL — ABNORMAL LOW (ref 150–400)
RBC: 3.45 MIL/uL — ABNORMAL LOW (ref 4.22–5.81)
RDW: 15.1 % (ref 11.5–15.5)
WBC: 9.5 10*3/uL (ref 4.0–10.5)
nRBC: 0 % (ref 0.0–0.2)

## 2022-05-10 LAB — BASIC METABOLIC PANEL
Anion gap: 6 (ref 5–15)
Anion gap: 6 (ref 5–15)
BUN: 15 mg/dL (ref 8–23)
BUN: 15 mg/dL (ref 8–23)
CO2: 25 mmol/L (ref 22–32)
CO2: 27 mmol/L (ref 22–32)
Calcium: 7.8 mg/dL — ABNORMAL LOW (ref 8.9–10.3)
Calcium: 8.2 mg/dL — ABNORMAL LOW (ref 8.9–10.3)
Chloride: 107 mmol/L (ref 98–111)
Chloride: 106 mmol/L (ref 98–111)
Creatinine, Ser: 1.19 mg/dL (ref 0.61–1.24)
Creatinine, Ser: 1.26 mg/dL — ABNORMAL HIGH (ref 0.61–1.24)
GFR, Estimated: >60 mL/min (ref >60)
GFR, Estimated: 58 mL/min — ABNORMAL LOW (ref >60)
Glucose, Bld: 95 mg/dL (ref 70–99)
Glucose, Bld: 107 mg/dL — ABNORMAL HIGH (ref 70–99)
Potassium: 3.7 mmol/L (ref 3.5–5.1)
Potassium: 4 mmol/L (ref 3.5–5.1)
Sodium: 138 mmol/L (ref 135–145)
Sodium: 139 mmol/L (ref 135–145)

## 2022-05-10 LAB — GLUCOSE, CAPILLARY
Glucose-Capillary: 116 mg/dL — ABNORMAL HIGH (ref 70–99)
Glucose-Capillary: 87 mg/dL (ref 70–99)

## 2022-05-10 LAB — ECHOCARDIOGRAM COMPLETE
AR max vel: 2.03 cm2
AV Peak grad: 5.9 mmHg
Ao pk vel: 1.21 m/s
Area-P 1/2: 3.61 cm2
Calc EF: 58.7 %
Height: 68 in
S' Lateral: 2.9 cm
Single Plane A2C EF: 56.9 %
Single Plane A4C EF: 57 %
Weight: 3841.3 oz

## 2022-05-10 LAB — LACTIC ACID, PLASMA: Lactic Acid, Venous: 1.3 mmol/L (ref 0.5–1.9)

## 2022-05-10 LAB — MAGNESIUM: Magnesium: 2.1 mg/dL (ref 1.7–2.4)

## 2022-05-10 MED ORDER — AMIODARONE HCL IN DEXTROSE 360-4.14 MG/200ML-% IV SOLN
30.0000 mg/h | INTRAVENOUS | Status: DC
  Administered 2022-05-10 – 2022-05-12 (×4): 30 mg/h via INTRAVENOUS
  Filled 2022-05-10 (×4): qty 200

## 2022-05-10 MED ORDER — LIDOCAINE HCL (CARDIAC) PF 100 MG/5ML IV SOSY
PREFILLED_SYRINGE | INTRAVENOUS | Status: AC
  Filled 2022-05-10: qty 5

## 2022-05-10 MED ORDER — AMIODARONE HCL IN DEXTROSE 360-4.14 MG/200ML-% IV SOLN
INTRAVENOUS | Status: AC
  Filled 2022-05-10: qty 200

## 2022-05-10 MED ORDER — AMIODARONE HCL IN DEXTROSE 360-4.14 MG/200ML-% IV SOLN
60.0000 mg/h | INTRAVENOUS | Status: AC
  Administered 2022-05-10: 60 mg/h via INTRAVENOUS
  Filled 2022-05-10: qty 200

## 2022-05-10 MED ORDER — AMIODARONE IV BOLUS ONLY 150 MG/100ML: 150.0000 mg | Freq: Once | INTRAVENOUS | Status: DC

## 2022-05-10 MED ORDER — AMIODARONE LOAD VIA INFUSION
150.0000 mg | Freq: Once | INTRAVENOUS | Status: AC
Start: 2022-05-10 — End: 2022-05-10
  Administered 2022-05-10: 150 mg via INTRAVENOUS

## 2022-05-10 NOTE — Progress Notes (Signed)
Seen pt from [phone],  pt is in a-fib with rates over 100 and had run of Vtach (Nurse aware). Pt is doing okay. Both thought it would be best to abandon ambulation. Will give time for pt to rest and f/u with education if time allows.    Christen Bame 10:16 AM 05/10/2022

## 2022-05-10 NOTE — Progress Notes (Signed)
Patient converted back to S.R. 1st Degree heart block Heart rate in the 70. V.S.S. Estill Bamberg Rn aware.

## 2022-05-10 NOTE — Progress Notes (Addendum)
3 Days Post-Op Procedure(s) (LRB): CORONARY ARTERY BYPASS GRAFTING (CABG) X 3 , USING LEFT INTERNAL MAMMARY ARTERY AND RIGHT LEG GREATER SAPHENOUS VEIN HARVESTED ENDOSCOPICALLY (N/A) TRANSESOPHAGEAL ECHOCARDIOGRAM (TEE) (N/A) Subjective: Patient states he is feeling okay except for when he has his rhythm episodes. He states he is not in pain but is having trouble taking deep breaths.   Objective: Vital signs in last 24 hours: Temp:  [97.6 F (36.4 C)-98.6 F (37 C)] 98.6 F (37 C) (07/27 0648) Pulse Rate:  [70-141] 73 (07/27 0648) Cardiac Rhythm: Ventricular tachycardia (07/27 0513) Resp:  [14-25] 16 (07/27 0648) BP: (93-167)/(58-113) 96/60 (07/27 0648) SpO2:  [90 %-99 %] 97 % (07/27 0648) Weight:  [108.9 kg] 108.9 kg (07/27 0500)  Hemodynamic parameters for last 24 hours:    Intake/Output from previous day: 07/26 0701 - 07/27 0700 In: 267.7 [P.O.:240; I.V.:27.7] Out: 325 [Urine:325] Intake/Output this shift: No intake/output data recorded.  General appearance: alert, cooperative, and no distress Heart: irregularly irregular rhythm Lungs: wheezes throughout Abdomen: soft, non-tender; bowel sounds normal; no masses,  no organomegaly Extremities: edema 1+ Wound: Clean and dry, no erythema   Lab Results: Recent Labs    05/09/22 0302 05/10/22 0120  WBC 10.3 9.5  HGB 11.4* 11.2*  HCT 33.7* 32.7*  PLT 67* 82*   BMET:  Recent Labs    05/10/22 0120 05/10/22 0601  NA 138 139  K 3.7 4.0  CL 107 106  CO2 25 27  GLUCOSE 95 107*  BUN 15 15  CREATININE 1.19 1.26*  CALCIUM 7.8* 8.2*    PT/INR:  Recent Labs    05/07/22 1336  LABPROT 16.8*  INR 1.4*   ABG    Component Value Date/Time   PHART 7.358 05/08/2022 0308   HCO3 25.2 05/08/2022 0308   TCO2 26 05/08/2022 0308   ACIDBASEDEF 6.0 (H) 05/07/2022 2302   O2SAT 97 05/08/2022 0308   CBG (last 3)  Recent Labs    05/09/22 1645 05/09/22 2115 05/10/22 0602  GLUCAP 105* 91 116*    Assessment/Plan: S/P  Procedure(s) (LRB): CORONARY ARTERY BYPASS GRAFTING (CABG) X 3 , USING LEFT INTERNAL MAMMARY ARTERY AND RIGHT LEG GREATER SAPHENOUS VEIN HARVESTED ENDOSCOPICALLY (N/A) TRANSESOPHAGEAL ECHOCARDIOGRAM (TEE) (N/A)  1.CV-PAF, developed WCT overnight. Treated with amio per Cardiology recommendations. Currently in A fib/flutter with conduction abnormalities per Cards/EP. Will leave in EPW. 2.Pulm-98% O2 sats on 4L [place]. Slight wheezing throughout continue IS and CXR ordered.  3.GI-No bowel movement. No nausea/vomiting. Walked once yesterday. 4.Renal-Cr 1.26, hx of CKD will monitor renal function. 5.Extremities-Edema due to fluid overload. Continue diuresis. 6.Wounds-No sign of infection. 7. Volume overload-will continue Lasix. ID-HGB/HCT stable at 11.2/32.7. Thrombocytopenia slightly increased to 82,000.   LOS: 3 days    Magdalene River, PA-C 05/10/2022  Patient seen and examined, looks good at present Went back into AF with RVR- rebolused and now back on amiodarone drip. [place] Cardiology assistance  Revonda Standard. Roxan Hockey, MD [facility] [phone]

## 2022-05-10 NOTE — Significant Event (Signed)
[facility] Event Note   Reason for Call :  Persistent WCT  Initial Focused Assessment:  Patient laying in bed with eyes open, Aox4, with complaints of SOB. Lungs are clear/diminished in the lower bases. Extremities warm to the touch.   VS: HR 140 BP 132/88(103) RR 22 Sats 95% on 2L[place]  Interventions:  Placed zoll pads on patient Called cardiology to bedside per Dr. Roxan Hockey  Plan of Care:  Amio bolus and protocol per cardiology Continue to monitor VS frequently Maintain PIVx2 Maintain zoll pads on patient  Event Summary:  Patient converted to afib with rates 110-130 around 0550 VS: HR 117 BP 135/92 RR 22 Sats 97% on 2L [place] Patient reports improvement in work of breathing Notify RRT if further assistance is needed.   MD Notified: Dr. Roxan Hockey by Primary RN, Dr. Foye Clock  Call Time: [phone] Arrival Time: 9381 End Time: 0600  Monna Fam, RN

## 2022-05-10 NOTE — Progress Notes (Signed)
Patient has had several runs of sustained v-tach this evening. Denies SOB or chest pain. BP 122/88. Jadene Pierini PA notified. RN administered 85m Lopressor IV and patient remains on amiodarone drip. No new orders at this time.

## 2022-05-10 NOTE — Progress Notes (Signed)
Patient converted to S.R. 72

## 2022-05-10 NOTE — Progress Notes (Addendum)
Cardiology Progress Note  Patient ID: Luis Deleon MRN: [id number] DOB: 11-15-1940 Date of Encounter: 05/10/2022  Primary Cardiologist: Quay Burow, MD  Subjective   Chief Complaint: None.   HPI: Wide-complex tachycardia overnight.  Now in and out of A-fib.  To me this appears to be likely A-fib with left bundle branch block aberrancy.  Currently on amiodarone drip.  ROS:  All other ROS reviewed and negative. Pertinent positives noted in the HPI.     Inpatient Medications  Scheduled Meds:  acetaminophen  1,000 mg Oral Q6H   Or   acetaminophen (TYLENOL) oral liquid 160 mg/5 mL  1,000 mg Per Tube Q6H   allopurinol  100 mg Oral BID   amiodarone  400 mg Oral BID   aspirin EC  325 mg Oral Daily   Or   aspirin  324 mg Per Tube Daily   atorvastatin  40 mg Oral Daily   bisacodyl  10 mg Oral Daily   Or   bisacodyl  10 mg Rectal Daily   Chlorhexidine Gluconate Cloth  6 each Topical Daily   [facility], Patient & Family Education   Does not apply Once   furosemide  40 mg Oral Daily   gemfibrozil  600 mg Oral Once per day on Mon Wed Fri   metoCLOPramide  10 mg Oral TID WC & HS   metoprolol tartrate  25 mg Oral BID   Or   metoprolol tartrate  25 mg Per Tube BID   pantoprazole  40 mg Oral Daily   potassium chloride  20 mEq Oral Daily   sodium chloride flush  3 mL Intravenous Q12H   Continuous Infusions:  sodium chloride     amiodarone 60 mg/hr (05/10/22 0553)   amiodarone     PRN Meds: sodium chloride, alum & mag hydroxide-simeth, baclofen, magnesium hydroxide, metoprolol tartrate, nystatin-triamcinolone, ondansetron (ZOFRAN) IV, oxyCODONE, sodium chloride flush, traMADol   Vital Signs   Vitals:   05/10/22 0600 05/10/22 0615 05/10/22 0630 05/10/22 0648  BP: (!) 113/98 117/74 96/73 96/60   Pulse: (!) 115 (!) 136 (!) 133 73  Resp: 20 (!) 21 16 16   Temp: 98.5 F (36.9 C) 98.5 F (36.9 C) 98.5 F (36.9 C) 98.6 F (37 C)  TempSrc: Oral Oral Oral Oral   SpO2: 97% 98% 99% 97%  Weight:      Height:        Intake/Output Summary (Last 24 hours) at 05/10/2022 0909 Last data filed at 05/09/2022 2149 Gross per 24 hour  Intake 240 ml  Output 225 ml  Net 15 ml      05/10/2022    5:00 AM 05/09/2022    5:00 AM 05/08/2022    5:00 AM  Last 3 Weights  Weight (lbs) 240 lb 1.3 oz 245 lb 6 oz 243 lb 9.7 oz  Weight (kg) 108.9 kg 111.3 kg 110.5 kg      Telemetry  Overnight telemetry shows atrial fibrillation with intermittent sinus rhythm, wide-complex tachycardia noted overnight which appears to be atrial flutter with aberrant conduction, which I personally reviewed.   ECG  The most recent ECG shows A-fib heart rate 121, nonspecific ST-T changes, which I personally reviewed.   Physical Exam   Vitals:   05/10/22 0600 05/10/22 0615 05/10/22 0630 05/10/22 0648  BP: (!) 113/98 117/74 96/73 96/60   Pulse: (!) 115 (!) 136 (!) 133 73  Resp: 20 (!) 21 16 16   Temp: 98.5 F (36.9 C) 98.5 F (36.9 C) 98.5  F (36.9 C) 98.6 F (37 C)  TempSrc: Oral Oral Oral Oral  SpO2: 97% 98% 99% 97%  Weight:      Height:        Intake/Output Summary (Last 24 hours) at 05/10/2022 0909 Last data filed at 05/09/2022 2149 Gross per 24 hour  Intake 240 ml  Output 225 ml  Net 15 ml       05/10/2022    5:00 AM 05/09/2022    5:00 AM 05/08/2022    5:00 AM  Last 3 Weights  Weight (lbs) 240 lb 1.3 oz 245 lb 6 oz 243 lb 9.7 oz  Weight (kg) 108.9 kg 111.3 kg 110.5 kg    Body mass index is 36.5 kg/m.  General: Well nourished, well developed, in no acute distress Head: Atraumatic, normal size  Eyes: PEERLA, EOMI  Neck: Supple, no JVD Endocrine: No thryomegaly Cardiac: Normal S1, S2; irregular rhythm, no murmurs Lungs: Diminished breath sounds bilaterally Abd: Soft, nontender, no hepatomegaly  Ext: No edema, pulses 2+ Musculoskeletal: No deformities, BUE and BLE strength normal and equal Skin: Warm and dry, no rashes   Neuro: Alert and oriented to person, place,  time, and situation, CNII-XII grossly intact, no focal deficits  Psych: Normal mood and affect   Labs  High Sensitivity Troponin:  No results for input(s): "TROPONINIHS" in the last 720 hours.   Cardiac EnzymesNo results for input(s): "TROPONINI" in the last 168 hours. No results for input(s): "TROPIPOC" in the last 168 hours.  Chemistry Recent Labs  Lab 05/09/22 0302 05/10/22 0120 05/10/22 0601  NA 138 138 139  K 4.0 3.7 4.0  CL 105 107 106  CO2 25 25 27   GLUCOSE 112* 95 107*  BUN 17 15 15   CREATININE 1.25* 1.19 1.26*  CALCIUM 7.9* 7.8* 8.2*  GFRNONAA 59* >60 58*  ANIONGAP 8 6 6     Hematology Recent Labs  Lab 05/08/22 1636 05/09/22 0302 05/10/22 0120  WBC 12.0* 10.3 9.5  RBC 3.69* 3.47* 3.45*  HGB 12.1* 11.4* 11.2*  HCT 35.5* 33.7* 32.7*  MCV 96.2 97.1 94.8  MCH 32.8 32.9 32.5  MCHC 34.1 33.8 34.3  RDW 14.9 15.3 15.1  PLT 73* 67* 82*   BNPNo results for input(s): "BNP", "PROBNP" in the last 168 hours.  DDimer No results for input(s): "DDIMER" in the last 168 hours.   Radiology  DG Chest Port 1 View  Result Date: 05/09/2022 CLINICAL DATA:  Status post CABG EXAM: PORTABLE CHEST 1 VIEW COMPARISON:  05/08/2022 and prior studies FINDINGS: Thoracostomy and mediastinal tubes, and RIGHT IJ Swan-Ganz catheter have been removed. RIGHT IJ central venous catheter sheath remains. Cardiomediastinal silhouette is unchanged.  CABG again noted. Bibasilar opacities/atelectasis and trace effusions are again identified. There is no evidence of pneumothorax. IMPRESSION: 1. Removal of support apparatus without other significant change. No pneumothorax. 2. Bibasilar opacities/atelectasis and trace effusions. Electronically Signed   By: Margarette Canada M.D.   On: 05/09/2022 08:11    Cardiac Studies  TTE 03/27/2022  1. Left ventricular ejection fraction, by estimation, is 60 to 65%. The  left ventricle has normal function. The left ventricle has no regional  wall motion abnormalities. Left  ventricular diastolic parameters were  normal. The average left ventricular  global longitudinal strain is -24.8 %. The global longitudinal strain is  normal.   2. Right ventricular systolic function is normal. The right ventricular  size is normal. There is normal pulmonary artery systolic pressure. The  estimated right ventricular systolic pressure  is 28.8 mmHg.   3. The mitral valve is normal in structure. Trivial mitral valve  regurgitation. No evidence of mitral stenosis.   4. The aortic valve is tricuspid. There is mild calcification of the  aortic valve. There is mild thickening of the aortic valve. Aortic valve  regurgitation is not visualized. Aortic valve sclerosis is present, with  no evidence of aortic valve stenosis.   5. The inferior vena cava is dilated in size with >50% respiratory  variability, suggesting right atrial pressure of 8 mmHg.   Patient Profile  Luis Deleon is a 81 y.o. male with CAD, CKD, obesity who was admitted on 05/07/2022 for CABG.  Cardiology was consulted on 05/10/2022 for wide-complex tachycardia.  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  This is a left bundle branch block.  There is no notes with axis.  The axis is all expected for a left bundle.  Did review the telemetry also appears to be irregular at times.  To me this likely represents atrial fibrillation with left bundle branch block aberrancy which appears to be rate dependent. -Currently in and out of A-fib.  Would continue amiodarone drip for now. Transition to oral amio tomorrow.  -Discussed his case with electrophysiology.  They reviewed his EKG and telemetry.  They believe this was atrial fibrillation with aberrant conduction. -We will recheck an echocardiogram. -If he continues to have A-fib likely will need anticoagulation.  We will hold for now given that this  is postop. -Ejection fraction was normal before surgery.  As long as echo is stable and see no need for heart catheterization.  He is without symptoms of chest pain or trouble breathing. -Continue beta-blocker. -Diuresis per surgery.  #CAD status post CABG -Seems to be doing well.  Continue aspirin.  On statin.  On a beta-blocker.  Work-up as above for wide-complex tachyarrhythmia which seems to be A-fib.    For questions or updates, please contact [place] Please consult [email or web address] for contact info under   Signed, [place] T. Audie Box, MD, [place]  05/10/2022 9:09 AM

## 2022-05-10 NOTE — Progress Notes (Signed)
Seen pt from [phone], pt was educated on heart healthy diet, risk factors, ex guidelines, and CRPII. Pt is interested about CRPII in [place] 05/10/2022 2:41 PM

## 2022-05-10 NOTE — Progress Notes (Signed)
Patient W.C.T. vs V-Tach rate 140's C/O of some SOB alert and orin. Skin warm and dry no resp. Distress noted. Estill Bamberg R.N. aware and into assess patient Evonnie Dawes. With Rapid response Called and made aware and on her way to see patient. Dr. Roxan Hockey notified and order for me to call In house Cardiology to come see patient. Dr. Mignon Pine  was called at 05:24and on his way to see patient. Stat 12 lead EKG done. Increase 02 to 4 l/m N.C. Freq. V.S. done. See vital sign flow sheet. Dr. Mignon Pine return call at 05:27 and arrived 05:28 M.D. order Amiodarone Bolus given by R.N. and Drip was started by R.N. M.D. request pacer pads to be place on patient at 05:44 . Rhythm back to At Flutter 100-120 at 05:50 Patient resting comfort. Emotional support given. Cont. To monitor patient and V.S.S. and Rhythm. Repeat EKG done per order. NPO except meds for now.

## 2022-05-10 NOTE — Consult Note (Signed)
Cardiology Consultation:   Patient ID: Luis Deleon MRN: [id number]; DOB: Mar 26, 1941  Admit date: 05/07/2022 Date of Consult: 05/10/2022  PCP:  Dettinger, Fransisca Kaufmann, MD   [facility] Providers Cardiologist:  Quay Burow, MD        Patient Profile:   Luis Deleon is a 81 y.o. male with a hx of coronary artery disease, CKD, Crohn's, obesity who is being seen 05/10/2022 for the evaluation of wide-complex tachycardia at the request of CTS.  History of Present Illness:   Luis Deleon is a 81 year old male with CAD CKD, obesity who underwent three-vessel CABG on 7/24.  Postoperative course.  By atrial fibrillation on with conversion to sinus rhythm with amiodarone.  He was then transition to p.o. amiodarone as well as metoprolol for paroxysmal A-fib/flutter with rapid ventricular rate.  This morning around 5:40 AM developed a wide-complex tachycardia with associated shortness of breath but denied chest pain and vital signs stable.  Bolused 150 IV amiodarone with conversion back to narrow complex tachycardia (per telemetry looks like a flutter with variable block) and was continued on IV amnio drip.  He denied any preceding chest pain prior to this episode.  His vital signs remained stable throughout this episode.  Wide-complex tachycardia with rates in the 140s.  Denies fevers or chills.  Past Medical History:  Diagnosis Date   Arthritis    Balanoposthitis    Cancer [facility]) 2013   prostate   CKD (chronic kidney disease), stage III ([place])    Coronary artery disease    Crohn disease ([place])    followed by dr Hilarie Fredrickson--- dx 1984, terminal ileum-- prolonged clinical remission since bowel resection 2003   Diverticulosis    Dyspnea    with exertion   Edema of both lower extremities    Erythema    penis   Fatty liver    followed by dr Hilarie Fredrickson   Gout    12-08-2020  per pt last episode approx. 2016  right great toe   Hepatic cyst    History of adenomatous polyp of colon    History of  kidney stones    History of prostate cancer    11-16-2004  s/p  radical prostatectomy with pelvic lymph node dissection   History of small bowel obstruction 2003   s/p  bowel resection   Hypertension    followed by pcp   (nuclear study 01-14-2018 in epic low risk without ischemia , nuclear ef 51% per cardiology note from dr berry)   Intermittent abdominal pain    followed by dr Hilarie Fredrickson---  due to adhesions and partial sbo   Internal hemorrhoids    Mesenteric artery stenosis [facility])    vascular-- dr Trula Slade   Mixed hyperlipidemia    Multinodular thyroid    Benign bilateral bx's 08-09-2010 ;  last ultrasound in epic 09-19-2011    Nephrolithiasis    Polycythemia    currently followed by pcp;   previously seen by hematologist/ oncology--- dr Jerilynn Mages. Julien Nordmann,  lov in epic 01-28-2013  hx phlebotomy and donates blood   PSVT (paroxysmal supraventricular tachycardia) [facility])    cardiologist--- dr Gwenlyn Found ; episdoe at time of prostatectomy 2006 and recurrent 03/ 2019;  event monitor 01-15-2018 showed NSR,  echo 12-20-2017 with ef 60-65% mild LVH mild TR   Renal cyst, acquired    urologist-- dr Junious Silk   Vitamin D deficiency     Past Surgical History:  Procedure Laterality Date   APPENDECTOMY     BACTERIAL OVERGROWTH TEST  N/A 06/15/2016   Procedure: BACTERIAL OVERGROWTH TEST;  Surgeon: Md Physician Gastroenterology, MD;  [facility];  Service: Gastroenterology;  Laterality: N/A;   COLONOSCOPY WITH PROPOFOL  last one 06-24-2019  dr pyrtle   CORONARY ARTERY BYPASS GRAFT N/A 05/07/2022   Procedure: CORONARY ARTERY BYPASS GRAFTING (CABG) X 3 , USING LEFT INTERNAL MAMMARY ARTERY AND RIGHT LEG GREATER SAPHENOUS VEIN HARVESTED ENDOSCOPICALLY;  Surgeon: Melrose Nakayama, MD;  Location: [place];  Service: Open Heart Surgery;  Laterality: N/A;   CYSTOSCOPY  WITH URETHRAL DORSAL FORSKIN INCISION  10-10-2018  @[facility]    EXPLORATORY LAPAROTOMY W/ BOWEL RESECTION  05-22-2002  @[facility]    distal ileum and cecum /  appendectomy     LAPAROSCOPIC ASSISTED VENTRAL HERNIA REPAIR  05-12-2003  @[facility]    LAPAROSCOPY EXTENSIVE LYSIS ADHESIONS AND VENTRAL HERNIA REPAIR  02-11-2008  @[facility]    LEFT HEART CATH AND CORONARY ANGIOGRAPHY N/A 04/16/2022   Procedure: LEFT HEART CATH AND CORONARY ANGIOGRAPHY;  Surgeon: Lorretta Harp, MD;  Location: [place] CV LAB;  Service: Cardiovascular;  Laterality: N/A;   LITHOTRIPSY  1980   PENILE BIOPSY N/A 12/13/2020   Procedure: PENILE AND FORESKIN BIOPSY;  Surgeon: Festus Aloe, MD;  Location: [facility];  Service: Urology;  Laterality: N/A;   RETROPUBIC PROSTATECTOMY  11-16-2004   @[facility]    TEE WITHOUT CARDIOVERSION N/A 05/07/2022   Procedure: TRANSESOPHAGEAL ECHOCARDIOGRAM (TEE);  Surgeon: Melrose Nakayama, MD;  Location: [place];  Service: Open Heart Surgery;  Laterality: N/A;   TONSILLECTOMY  age 85     Home Medications:  Prior to Admission medications   Medication Sig Start Date End Date Taking? Authorizing Provider  allopurinol (ZYLOPRIM) 100 MG tablet Take 1 tablet (100 mg total) by mouth 2 (two) times daily. 01/15/22  Yes Dettinger, Fransisca Kaufmann, MD  aspirin 81 MG EC tablet Take 81 mg by mouth every evening.   Yes [provider]  atorvastatin (LIPITOR) 40 MG tablet TAKE 1 TABLET BY MOUTH DAILY AT 6 PM. 09/20/21  Yes Dettinger, Fransisca Kaufmann, MD  Cholecalciferol (VITAMIN D3) 125 MCG (5000 UT) CAPS Take 5,000 Units by mouth See admin instructions. Take 1 tablet by mouth every day except on Sundays.   Yes [provider]  gemfibrozil (LOPID) 600 MG tablet Take 1 tablet (600 mg total) by mouth 3 (three) times a week. Monday , Wednesday, Friday 01/15/22  Yes Dettinger, Fransisca Kaufmann, MD  hydrochlorothiazide (HYDRODIURIL) 12.5 MG tablet Take 1 tablet (12.5 mg total) by mouth daily. 03/02/22  Yes Lorretta Harp, MD  losartan (COZAAR) 25 MG tablet Take 1 tablet (25 mg total) by mouth daily. 01/15/22  Yes Dettinger, Fransisca Kaufmann, MD  metoprolol tartrate (LOPRESSOR)  50 MG tablet TAKE 1 TABLET BY MOUTH TWICE A DAY 09/20/21  Yes Dettinger, Fransisca Kaufmann, MD  Omega-3 Fatty Acids (OMEGA-3 FISH OIL) 1200 MG CAPS Take 2,400 mg by mouth in the morning and at bedtime.   Yes [provider]  potassium chloride SA (KLOR-CON M20) 20 MEQ tablet Take 2 tablets (40 mEq total) by mouth daily. 10/02/21  Yes Dettinger, Fransisca Kaufmann, MD  baclofen (LIORESAL) 10 MG tablet TAKE 1 TABLET BY MOUTH AT BEDTIME AS NEEDED FOR MUSCLE SPASMS. 01/15/22   Dettinger, Fransisca Kaufmann, MD  HYDROmorphone (DILAUDID) 2 MG tablet Take 2 mg by mouth daily as needed for severe pain.    [provider]  nystatin-triamcinolone (MYCOLOG II) cream Apply 1 application topically 2 (two) times daily. Patient taking differently: Apply  1 application  topically 2 (two) times daily as needed (rash). 03/05/18   Chipper Herb, MD    Inpatient Medications: Scheduled Meds:  acetaminophen  1,000 mg Oral Q6H   Or   acetaminophen (TYLENOL) oral liquid 160 mg/5 mL  1,000 mg Per Tube Q6H   allopurinol  100 mg Oral BID   amiodarone  400 mg Oral BID   aspirin EC  325 mg Oral Daily   Or   aspirin  324 mg Per Tube Daily   atorvastatin  40 mg Oral Daily   bisacodyl  10 mg Oral Daily   Or   bisacodyl  10 mg Rectal Daily   Chlorhexidine Gluconate Cloth  6 each Topical Daily   [place] [facility], Patient & Family Education   Does not apply Once   furosemide  40 mg Oral Daily   gemfibrozil  600 mg Oral Once per day on Mon Wed Fri   insulin aspart  0-15 Units Subcutaneous TID WC   metoCLOPramide  10 mg Oral TID WC & HS   metoprolol tartrate  25 mg Oral BID   Or   metoprolol tartrate  25 mg Per Tube BID   pantoprazole  40 mg Oral Daily   potassium chloride  20 mEq Oral Daily   sodium chloride flush  3 mL Intravenous Q12H   Continuous Infusions:  sodium chloride     amiodarone 60 mg/hr (05/10/22 0553)   amiodarone     PRN Meds: sodium chloride, alum & mag hydroxide-simeth, baclofen, magnesium  hydroxide, metoprolol tartrate, nystatin-triamcinolone, ondansetron (ZOFRAN) IV, oxyCODONE, sodium chloride flush, traMADol  Allergies:    Allergies  Allergen Reactions   Colchicine Diarrhea, Nausea And Vomiting and Other (See Comments)    REACTION: nausea, diarrhea and interactions with 54m   Protamine Other (See Comments)    Likely mild protamine reaction with hypotension during CABG 2023    Social History:   Social History   Socioeconomic History   Marital status: Married    Spouse name: [name]  Number of children: 1   Years of education: 12   Highest education level: 12th grade  Occupational History   Occupation: Retired     Comment: LEvent organiser Tobacco Use   Smoking status: Former    Packs/day: 1.00    Years: 38.00    Total pack years: 38.00    Types: Cigarettes    Quit date: 03/19/1991    Years since quitting: 31.1   Smokeless tobacco: Current    Types: Chew  Vaping Use   Vaping Use: Never used  Substance and Sexual Activity   Alcohol use: No   Drug use: No   Sexual activity: Never  Other Topics Concern   Not on file  Social History Narrative   Lives with wife. [place]: [place]  (02/10/2021[place]   Overall Financial Resource Strain (CARDIA)    Difficulty of Paying Living Expenses: Not hard at all  Food Insecurity: No Food Insecurity (02/10/2021)   Hunger Vital Sign    Worried About Running Out of Food in the Last Year: Never true    Ran Out of Food in the Last Year: Never true  Transportation Needs: No Transportation Needs (02/10/2021)   PRAPARE - THydrologist(Medical): No    Lack of Transportation (Non-Medical): No  Physical Activity: Inactive (02/10/2021)   Exercise Vital Sign  Days of Exercise per Week: 0 days    Minutes of Exercise per Session: 0 min  Stress: No Stress Concern Present (02/10/2021)   [place]    Feeling of Stress : Not at all  Social Connections: Moderately Isolated (02/10/2021)   Social Connection and Isolation Panel [NHANES]    Frequency of Communication with Friends and Family: More than three times a week    Frequency of Social Gatherings with Friends and Family: Three times a week    Attends Religious Services: Never    Active Member of Clubs or Organizations: No    Attends Archivist Meetings: Never    Marital Status: Married  Human resources officer Violence: Not At Risk (02/10/2021)   Humiliation, Afraid, Rape, and Kick questionnaire    Fear of Current or Ex-Partner: No    Emotionally Abused: No    Physically Abused: No    Sexually Abused: No    Family History:    Family History  Problem Relation Age of Onset   Heart disease Mother    Cervical cancer Mother    Heart disease Father    Heart failure Father    Dislocations Daughter 8       Bilateral knee   Heart disease Maternal Uncle    Colon cancer Neg Hx    Esophageal cancer Neg Hx    Rectal cancer Neg Hx    Stomach cancer Neg Hx    Colon polyps Neg Hx      ROS:  Please see the history of present illness.   All other ROS reviewed and negative.     Physical Exam/Data:   Vitals:   05/10/22 0400 05/10/22 0500 05/10/22 0548 05/10/22 0550  BP: 108/88 128/80    Pulse: (!) 112 (!) 125    Resp: 16 20    Temp:      TempSrc:      SpO2: 97% 97% 91% 94%  Weight:      Height:        Intake/Output Summary (Last 24 hours) at 05/10/2022 0554 Last data filed at 05/09/2022 2149 Gross per 24 hour  Intake 421.81 ml  Output 355 ml  Net 66.81 ml      05/09/2022    5:00 AM 05/08/2022    5:00 AM 05/07/2022    5:47 AM  Last 3 Weights  Weight (lbs) 245 lb 6 oz 243 lb 9.7 oz 230 lb  Weight (kg) 111.3 kg 110.5 kg 104.327 kg     Body mass index is 37.31 kg/m.  General:  Well nourished, well developed, in mild acute distress HEENT: normal Neck: no JVD Vascular: No  carotid bruits; Distal pulses 2+ bilaterally Cardiac:  normal S1, S2; regular but tachycardic; no murmur  Lungs:  clear to auscultation bilaterally, no wheezing, rhonchi or rales  Abd: soft, nontender, no hepatomegaly  Ext: no edema Musculoskeletal:  No deformities, BUE and BLE strength normal and equal Skin: warm and dry  Neuro:  CNs 2-12 intact, no focal abnormalities noted Psych:  Normal affect   EKG:  The EKG was personally reviewed and demonstrates: New wide-complex tachycardia with rates 140s, LBBB Telemetry:  Telemetry was personally reviewed and demonstrates: Intermittent a flutter with variable block with developed a wide-complex tachycardia around 540  Relevant CV Studies: Intra-Op TEE on 7/24  POST-OP IMPRESSIONS  limited post-CPB exam: The patient separated easily from CPB, requiring  low-dose  Phenylephrine for hemodynamic support  _ Left  Ventricle: The left ventricular function appears normal, unchanged  from  pre-bypass images. Overall contracility improved to normal with time off  of CPB,  and progression to atrial pacing. EF 60% with normal wall motion.  _ Right Ventricle: The right ventricle appears unchanged from pre-bypass  images.  The cavity was mildly dilated, improving with time off of CPB.  _ Aortic Valve: The aortic valve function appears unchanged from  pre-bypass  images.  _ Mitral Valve: The mitral valve function appears unchanged from  pre-bypass  images. Mitral regurgitation improved to trivial MR.  _ Tricuspid Valve: The tricuspid valve function appears unchanged from  pre-bypass images.   PRE-OP FINDINGS   Left Ventricle: The left ventricle has normal systolic function, with an  ejection fraction of 60-65%, measured 69%. The cavity size was normal. No  evidence of left ventricular regional wall motion abnormalities. There is  no left ventricular hypertrophy.   Left ventricular diastolic function was not evaluated.   Laboratory Data:  High  Sensitivity Troponin:  No results for input(s): "TROPONINIHS" in the last 720 hours.   Chemistry Recent Labs  Lab 05/07/22 2000 05/07/22 2203 05/08/22 0300 05/08/22 0308 05/08/22 1636 05/09/22 0302 05/10/22 0120  NA 139   < > 141   < > 140 138 138  K 4.5   < > 3.7   < > 3.7 4.0 3.7  CL 111  --  110  --  106 105 107  CO2 21*  --  24  --  25 25 25   GLUCOSE 125*  --  117*  --  132* 112* 95  BUN 15  --  14  --  15 17 15   CREATININE 1.03  --  1.19  --  1.20 1.25* 1.19  CALCIUM 7.7*  --  7.6*  --  7.9* 7.9* 7.8*  MG 2.9*  --  2.5*  --  2.3  --   --   GFRNONAA >60  --  >60  --  >60 59* >60  ANIONGAP 7  --  7  --  9 8 6    < > = values in this interval not displayed.    Recent Labs  Lab 05/03/22 0857  PROT 6.1*  ALBUMIN 3.1*  AST 28  ALT 20  ALKPHOS 46  BILITOT 2.8*   Lipids No results for input(s): "CHOL", "TRIG", "HDL", "LABVLDL", "LDLCALC", "CHOLHDL" in the last 168 hours.  Hematology Recent Labs  Lab 05/08/22 1636 05/09/22 0302 05/10/22 0120  WBC 12.0* 10.3 9.5  RBC 3.69* 3.47* 3.45*  HGB 12.1* 11.4* 11.2*  HCT 35.5* 33.7* 32.7*  MCV 96.2 97.1 94.8  MCH 32.8 32.9 32.5  MCHC 34.1 33.8 34.3  RDW 14.9 15.3 15.1  PLT 73* 67* 82*   Thyroid  Recent Labs  Lab 05/09/22 0302  TSH 1.957    BNPNo results for input(s): "BNP", "PROBNP" in the last 168 hours.  DDimer No results for input(s): "DDIMER" in the last 168 hours.   Radiology/Studies:  DG Chest Port 1 View  Result Date: 05/09/2022 CLINICAL DATA:  Status post CABG EXAM: PORTABLE CHEST 1 VIEW COMPARISON:  05/08/2022 and prior studies FINDINGS: Thoracostomy and mediastinal tubes, and RIGHT IJ Swan-Ganz catheter have been removed. RIGHT IJ central venous catheter sheath remains. Cardiomediastinal silhouette is unchanged.  CABG again noted. Bibasilar opacities/atelectasis and trace effusions are again identified. There is no evidence of pneumothorax. IMPRESSION: 1. Removal of support apparatus without other  significant change. No pneumothorax. 2. Bibasilar opacities/atelectasis and trace  effusions. Electronically Signed   By: Margarette Canada M.D.   On: 05/09/2022 08:11   DG Chest Port 1 View  Result Date: 05/08/2022 CLINICAL DATA:  Chest soreness today. Evaluate CABG change, support devices. EXAM: PORTABLE CHEST 1 VIEW COMPARISON:  Portable chest yesterday at 1:36 p.m. FINDINGS: 5:07 a.m. Right IJ Swan-Ganz catheter tip remains, today with the tip in the right pulmonary artery. ETT/NGT have been removed. There is a mediastinal drain and left chest tube with no visible pneumothorax. There is a small left and minimal right pleural effusions. There is a low inspiration. There is consolidation or atelectasis in the left base, atelectatic bands in the hypoinflated right base. The visualized lungs are otherwise clear. The mediastinal configuration is stable with aortic calcification and tortuosity, stable CABG change. IMPRESSION: 1. Removal NGT, ETT. 2. Swan-Ganz catheter tip in right pulmonary artery. 3. Left chest tube and single mediastinal drain in place with no visible pneumothorax. 4. Overall aeration unchanged with consolidation or atelectasis in the left base, small left and minimal right pleural effusions. Low inspiration. Electronically Signed   By: Telford Nab M.D.   On: 05/08/2022 07:47   ECHO INTRAOPERATIVE TEE  Result Date: 05/07/2022  *INTRAOPERATIVE TRANSESOPHAGEAL REPORT *  Patient Name:   Luis Deleon Date of Exam: 05/07/2022 Medical Rec #:  [id number]      Height:       68.0 in Accession #:    [id number]     Weight:       230.0 lb Date of Birth:  1941/02/02     BSA:          2.17 m Patient Age:    80 years       BP:           135/52 mmHg Patient Gender: M              HR:           55 bpm. Exam Location:  Anesthesiology Transesophogeal exam was perform intraoperatively during surgical procedure. Patient was closely monitored under general anesthesia during the entirety of examination. Indications:      Coronary Artery Disease Sonographer:     Bernadene Person RDCS Performing Phys: [place] Diagnosing Phys: Annye Asa MD Complications: No known complications during this procedure. POST-OP IMPRESSIONS limited post-CPB exam: The patient separated easily from CPB, requiring low-dose Phenylephrine for hemodynamic support _ Left Ventricle: The left ventricular function appears normal, unchanged from pre-bypass images. Overall contracility improved to normal with time off of CPB, and progression to atrial pacing. EF 60% with normal wall motion. _ Right Ventricle: The right ventricle appears unchanged from pre-bypass images. The cavity was mildly dilated, improving with time off of CPB. _ Aortic Valve: The aortic valve function appears unchanged from pre-bypass images. _ Mitral Valve: The mitral valve function appears unchanged from pre-bypass images. Mitral regurgitation improved to trivial MR. _ Tricuspid Valve: The tricuspid valve function appears unchanged from pre-bypass images. PRE-OP FINDINGS  Left Ventricle: The left ventricle has normal systolic function, with an ejection fraction of 60-65%, measured 69%. The cavity size was normal. No evidence of left ventricular regional wall motion abnormalities. There is no left ventricular hypertrophy.  Left ventricular diastolic function was not evaluated. Right Ventricle: The right ventricle has normal systolic function. The cavity was normal. There is no increase in right ventricular wall thickness. Left Atrium: Left atrial size was normal in size. No left atrial/left atrial appendage thrombus was detected. Left atrial  appendage velocity is normal at greater than 40 cm/s. Right Atrium: Right atrial size was normal in size. Catheter present in the right atrium. Interatrial Septum: No atrial level shunt detected by color flow Doppler. Pericardium: There is no evidence of pericardial effusion. Mitral Valve: The mitral valve is normal in structure.  Mitral valve regurgitation is trivial by color flow Doppler. There is no evidence of mitral valve vegetation. Pulmonary venous flow is normal. There is no evidence of mitral stenosis, with peak gradient 2 mmHg, mean gradient 1 mmHg. Tricuspid Valve: The tricuspid valve was normal in structure. Tricuspid valve regurgitation is trivial by color flow Doppler. No evidence of tricuspid stenosis is present. There is no evidence of tricuspid valve vegetation. Aortic Valve: The aortic valve is tricuspid. Aortic valve regurgitation was not visualized by color flow Doppler. There is no stenosis of the aortic valve, with peak gradient 5 mmHg, mean gradient 3 mmHg. There is no evidence of aortic valve vegetation. Pulmonic Valve: The pulmonic valve was normal in structure, with normal leaflet excursion. No evidence of pulmonic stenosis. Pulmonic valve regurgitation is trivial around the PA catheter by color flow Doppler. Aorta: The aortic root and ascending aorta appear normal in size and structure. There is evidence of plaque in the descending aorta; Grade III, measuring 3-33m in size. Pulmonary Artery: The pulmonary artery is of normal size. Venous: The inferior vena cava is normal in size with greater than 50% respiratory variability, suggesting right atrial pressure of 3 mmHg. Shunts: There is no evidence of an atrial septal defect. +-------------+--------++ AORTIC VALVE          +-------------+--------++ AV Mean Grad:3.0 mmHg +-------------+--------++ +-------------+--------++ MITRAL VALVE          +-------------+--------++ MV Mean grad:1.0 mmHg +-------------+--------++  CAnnye AsaMD Electronically signed by CAnnye AsaMD Signature Date/Time: 05/07/2022/2:13:20 PM    Final    DG Chest Port 1 View  Result Date: 05/07/2022 CLINICAL DATA:  Status post CABG EXAM: PORTABLE CHEST 1 VIEW COMPARISON:  05/03/2022 FINDINGS: Interval postoperative changes from recent CABG. ET tube terminates 4.3 cm  above the carina. Enteric tube extends into the stomach. Right IJ approach pulmonary arterial catheter terminates at the proximal pulmonary outflow. Mediastinal drain and left basilar chest tube in place. Stable heart size. Aortic atherosclerosis. Slightly low lung volumes. Small left pleural effusion. No pneumothorax. IMPRESSION: 1. Interval postoperative changes from recent CABG. Support lines and tubes in appropriate position. 2. Small left pleural effusion. Electronically Signed   By: NDavina PokeD.O.   On: 05/07/2022 13:51     Assessment and Plan:   New wide-complex tachycardia Patient postop day 3 from three-vessel bypass, postop complicated by A-fib/flutter with RVR requiring amnio and metoprolol for rate rhythm control.  He has been recovering well until this morning for which he developed new wide-complex tachycardia with rates in the 140.  Differential includes monomorphic VT versus rate dependent left bundle branch block versus a flutter with aberrancy.  Reassuringly, his hemodynamics remained stable, he was minimally symptomatic but this improved after bolus of IV amiodarone 150 mg and conversion back to narrow complex a flutter with variable block.  No preceding chest pain or infectious symptoms to suggest compromise of grafts.  Electrolytes stable, repeat pending. - Status post IV amiodarone 150 mg  - continue IV amiodarone drip for at least 5 g load - continue BB - follow up repeat BMP, Mg, lactate; replete as needed  - consider EP eval for MMVT vs. A.Flutter with  rate dependent LBBB - repeat TTE  Risk Assessment/Risk Scores:         For questions or updates, please contact [place] Please consult [email or web address] for contact info under    Signed, Silas Flood, MD  05/10/2022 5:54 AM

## 2022-05-10 NOTE — Progress Notes (Signed)
Patient sleeping and patient went back into At.FIB.RVR 130-140 . Patient unaware Heart rate back up and had no complaints. Gave Lopressor 5 mg I.V.  Estill Bamberg R.N. aware and  [place] R.N. aware. Cont. To monitor patient and Vital signs. Heart rate  below 120 after Lopressor given.

## 2022-05-11 DIAGNOSIS — I480 Paroxysmal atrial fibrillation: Secondary | ICD-10-CM | POA: Diagnosis not present

## 2022-05-11 LAB — BASIC METABOLIC PANEL
Anion gap: 7 (ref 5–15)
BUN: 15 mg/dL (ref 8–23)
CO2: 23 mmol/L (ref 22–32)
Calcium: 7.7 mg/dL — ABNORMAL LOW (ref 8.9–10.3)
Chloride: 106 mmol/L (ref 98–111)
Creatinine, Ser: 1.08 mg/dL (ref 0.61–1.24)
GFR, Estimated: >60 mL/min (ref >60)
Glucose, Bld: 93 mg/dL (ref 70–99)
Potassium: 3.5 mmol/L (ref 3.5–5.1)
Sodium: 136 mmol/L (ref 135–145)

## 2022-05-11 LAB — GLUCOSE, CAPILLARY: Glucose-Capillary: 73 mg/dL (ref 70–99)

## 2022-05-11 MED ORDER — LEVALBUTEROL HCL 0.63 MG/3ML IN NEBU
INHALATION_SOLUTION | RESPIRATORY_TRACT | Status: AC
  Filled 2022-05-11: qty 3

## 2022-05-11 MED ORDER — LEVALBUTEROL HCL 0.63 MG/3ML IN NEBU
0.6300 mg | INHALATION_SOLUTION | Freq: Four times a day (QID) | RESPIRATORY_TRACT | Status: DC | PRN
  Administered 2022-05-11: 0.63 mg via RESPIRATORY_TRACT

## 2022-05-11 MED ORDER — BENZONATATE 100 MG PO CAPS
100.0000 mg | ORAL_CAPSULE | Freq: Three times a day (TID) | ORAL | Status: AC
  Administered 2022-05-11 – 2022-05-14 (×9): 100 mg via ORAL
  Filled 2022-05-11 (×9): qty 1

## 2022-05-11 MED ORDER — GUAIFENESIN ER 600 MG PO TB12
600.0000 mg | ORAL_TABLET | Freq: Two times a day (BID) | ORAL | Status: AC
  Administered 2022-05-11 – 2022-05-16 (×10): 600 mg via ORAL
  Filled 2022-05-11 (×10): qty 1

## 2022-05-11 MED ORDER — METOPROLOL TARTRATE 50 MG PO TABS
50.0000 mg | ORAL_TABLET | Freq: Two times a day (BID) | ORAL | Status: DC
  Administered 2022-05-11 – 2022-05-13 (×5): 50 mg via ORAL
  Filled 2022-05-11 (×5): qty 1

## 2022-05-11 MED ORDER — POTASSIUM CHLORIDE CRYS ER 20 MEQ PO TBCR
40.0000 meq | EXTENDED_RELEASE_TABLET | Freq: Every day | ORAL | Status: DC
  Administered 2022-05-11 – 2022-05-15 (×5): 40 meq via ORAL
  Filled 2022-05-11 (×5): qty 2

## 2022-05-11 MED ORDER — METOPROLOL TARTRATE 25 MG/10 ML ORAL SUSPENSION
50.0000 mg | Freq: Two times a day (BID) | ORAL | Status: DC
  Filled 2022-05-11 (×3): qty 20

## 2022-05-11 NOTE — Progress Notes (Signed)
Patient sleeping in bed and Heart rate up to 140's W.C.T.No complaints voice and was unaware heart rate was fast. Lopressor 5 mg I.V. given when in couple of minutes Heart Rate back to 80's S.R, fiest degree heart block.Cont. to monitor patient and rhythm.

## 2022-05-11 NOTE — Progress Notes (Addendum)
      [address]       [place] [address]             [phone]      4 Days Post-Op Procedure(s) (LRB): CORONARY ARTERY BYPASS GRAFTING (CABG) X 3 , USING LEFT INTERNAL MAMMARY ARTERY AND RIGHT LEG GREATER SAPHENOUS VEIN HARVESTED ENDOSCOPICALLY (N/A) TRANSESOPHAGEAL ECHOCARDIOGRAM (TEE) (N/A)  Subjective:  Patient is without complaints.  He denies nausea, chest pain.  He is surprised with his heart rhythm.  He is asking when I think he can go home as he takes care of his wife.  + BM  Objective: Vital signs in last 24 hours: Temp:  [98.2 F (36.8 C)-98.6 F (37 C)] 98.2 F (36.8 C) (07/28 0410) Pulse Rate:  [65-128] 72 (07/28 0208) Cardiac Rhythm: Ventricular tachycardia (07/28 0249) Resp:  [15-21] 20 (07/28 0410) BP: (103-128)/(50-88) 123/66 (07/28 0410) SpO2:  [99 %-100 %] 100 % (07/28 0410) Weight:  [107.1 kg] 107.1 kg (07/28 0410)  Intake/Output from previous day: 07/27 0701 - 07/28 0700 In: -  Out: 2000 [Urine:2000]  General appearance: alert, cooperative, and no distress Heart: irregularly irregular rhythm Lungs: clear to auscultation bilaterally Abdomen: soft, non-tender; bowel sounds normal; no masses,  no organomegaly Extremities: edema trace, ecchymosis RLE Wound: clean and dry  Lab Results: Recent Labs    05/09/22 0302 05/10/22 0120  WBC 10.3 9.5  HGB 11.4* 11.2*  HCT 33.7* 32.7*  PLT 67* 82*   BMET:  Recent Labs    05/10/22 0601 05/11/22 0119  NA 139 136  K 4.0 3.5  CL 106 106  CO2 27 23  GLUCOSE 107* 93  BUN 15 15  CREATININE 1.26* 1.08  CALCIUM 8.2* 7.7*    PT/INR: No results for input(s): "LABPROT", "INR" in the last 72 hours. ABG    Component Value Date/Time   PHART 7.358 05/08/2022 0308   HCO3 25.2 05/08/2022 0308   TCO2 26 05/08/2022 0308   ACIDBASEDEF 6.0 (H) 05/07/2022 2302   O2SAT 97 05/08/2022 0308   CBG (last 3)  Recent Labs    05/10/22 0602 05/10/22 2119 05/11/22 0542  GLUCAP 116* 87 73     Assessment/Plan: S/P Procedure(s) (LRB): CORONARY ARTERY BYPASS GRAFTING (CABG) X 3 , USING LEFT INTERNAL MAMMARY ARTERY AND RIGHT LEG GREATER SAPHENOUS VEIN HARVESTED ENDOSCOPICALLY (N/A) TRANSESOPHAGEAL ECHOCARDIOGRAM (TEE) (N/A)  CV- PAF, now with WCT which appears to be Atrial Fibrillation with abberancy and underlying LBBB- he is on IV Amiodarone, increased Lopressor to 50 mg BID, he will likely require Eliquis at discharge.... his EPW remain in place... Echocardiogram performed with preserved EF, no valvular abnormalities, no pericardial effusion.. there is some increased volume/pressure in the RV-- Cardiology is following Pulm- no acute issues, CXR remains pending Renal- creatinine improved at 1.08, he is edematous on exam continue Lasix Hypokalemia- due to diuretics, will increase potassium supplement Deconditioning-patient has been ambulated due to arrhythmia, needs to ambulate once able Dispo- patient stable, with tachyarrhythmia- Cardiology is following, continue Lasix, increase potassium supplement   LOS: 4 days   Ellwood Handler, PA-C 05/11/2022 C/o shortness of breath, wheezing on exam Just received levalbuterol neb Will add mucinex and tessalon In SR at present  [place]. Roxan Hockey, MD [facility] [phone]

## 2022-05-11 NOTE — Progress Notes (Signed)
Patient c/o feeling short of breath and like he cannot get a deep breath.  Also having a lot of productive coughing.  O2 sat 100% on RA.  Patient placed back on 2L [place] for comfort.  All other VS stable.  Lung sounds diminished with very minimal wheezing. RT called to bedside to assess.  Breathing treatment ordered and administered.  Patient instructed on using his heart pillow to facilitate productive cough.  Will continue to monitor.

## 2022-05-11 NOTE — Progress Notes (Signed)
Progress Note  Patient Name: Luis Deleon Date of Encounter: 05/11/2022  [facility] Cardiologist: Quay Burow, MD   Subjective   No CP or dyspnea  Inpatient Medications    Scheduled Meds:  acetaminophen  1,000 mg Oral Q6H   Or   acetaminophen (TYLENOL) oral liquid 160 mg/5 mL  1,000 mg Per Tube Q6H   allopurinol  100 mg Oral BID   amiodarone  400 mg Oral BID   aspirin EC  325 mg Oral Daily   Or   aspirin  324 mg Per Tube Daily   atorvastatin  40 mg Oral Daily   bisacodyl  10 mg Oral Daily   Or   bisacodyl  10 mg Rectal Daily   Chlorhexidine Gluconate Cloth  6 each Topical Daily   [facility] Patient [facility] Family Education   Does not apply Once   furosemide  40 mg Oral Daily   gemfibrozil  600 mg Oral Once per day on Mon Wed Fri   metoprolol tartrate  50 mg Oral BID   Or   metoprolol tartrate  50 mg Per Tube BID   pantoprazole  40 mg Oral Daily   potassium chloride  40 mEq Oral Daily   sodium chloride flush  3 mL Intravenous Q12H   Continuous Infusions:  sodium chloride     amiodarone 30 mg/hr (05/11/22 0038)   PRN Meds: sodium chloride, alum & mag hydroxide-simeth, baclofen, magnesium hydroxide, metoprolol tartrate, nystatin-triamcinolone, ondansetron (ZOFRAN) IV, oxyCODONE, sodium chloride flush, traMADol   Vital Signs    Vitals:   05/10/22 2332 05/11/22 0208 05/11/22 0410 05/11/22 0823  BP: 122/77 (!) 110/50 123/66 111/74  Pulse: 79 72    Resp: (!) 21 20 20    Temp: 98.5 F (36.9 C) 98.4 F (36.9 C) 98.2 F (36.8 C) 97.8 F (36.6 C)  TempSrc: Oral Oral Oral Oral  SpO2: 100% 99% 100%   Weight:   107.1 kg   Height:        Intake/Output Summary (Last 24 hours) at 05/11/2022 0940 Last data filed at 05/11/2022 0500 Gross per 24 hour  Intake --  Output 2000 ml  Net -2000 ml      05/11/2022    4:10 AM 05/10/2022    5:00 AM 05/09/2022    5:00 AM  Last 3 Weights  Weight (lbs) 236 lb 1.8 oz 240 lb 1.3 oz 245 lb 6 oz  Weight (kg)  107.1 kg 108.9 kg 111.3 kg      Telemetry    Sinus with PAF with aberrancy - Personally Reviewed    Physical Exam   GEN: No acute distress.   Neck: No JVD Cardiac: RRR Respiratory: Diminished BS; s/p sternotomy GI: Soft, nontender, non-distended  MS: No edema Neuro:  Nonfocal  Psych: Normal affect   Labs     Chemistry Recent Labs  Lab 05/08/22 0300 05/08/22 0308 05/08/22 1636 05/09/22 0302 05/10/22 0120 05/10/22 0601 05/11/22 0119  NA 141   < > 140   < > 138 139 136  K 3.7   < > 3.7   < > 3.7 4.0 3.5  CL 110  --  106   < > 107 106 106  CO2 24  --  25   < > 25 27 23   GLUCOSE 117*  --  132*   < > 95 107* 93  BUN 14  --  15   < > 15 15 15   CREATININE 1.19  --  1.20   < > 1.19 1.26* 1.08  CALCIUM 7.6*  --  7.9*   < > 7.8* 8.2* 7.7*  MG 2.5*  --  2.3  --   --  2.1  --   GFRNONAA >60  --  >60   < > >60 58* >60  ANIONGAP 7  --  9   < > 6 6 7    < > = values in this interval not displayed.     Hematology Recent Labs  Lab 05/08/22 1636 05/09/22 0302 05/10/22 0120  WBC 12.0* 10.3 9.5  RBC 3.69* 3.47* 3.45*  HGB 12.1* 11.4* 11.2*  HCT 35.5* 33.7* 32.7*  MCV 96.2 97.1 94.8  MCH 32.8 32.9 32.5  MCHC 34.1 33.8 34.3  RDW 14.9 15.3 15.1  PLT 73* 67* 82*   Thyroid  Recent Labs  Lab 05/09/22 0302  TSH 1.957     Radiology    ECHOCARDIOGRAM COMPLETE  Result Date: 05/10/2022    ECHOCARDIOGRAM REPORT   Patient Name:   Luis Deleon Date of Exam: 05/10/2022 Medical Rec #:  [id number]      Height:       68.0 in Accession #:    [id number]     Weight:       240.1 lb Date of Birth:  [birth year]     BSA:          2.209 m Patient Age:    8 years       BP:           96/60 mmHg Patient Gender: M              HR:           67 bpm. Exam Location:  Inpatient Procedure: 2D Echo, Cardiac Doppler and Color Doppler Indications:    Ventricular tachycardia  History:        Patient has prior history of Echocardiogram examinations. CAD,                 Prior CABG; Risk  Factors:Hypertension.  Sonographer:    Jyl Heinz Referring Phys: [id number] [place]  1. Left ventricular ejection fraction, by estimation, is 55 to 60%. The left ventricle has normal function. The left ventricle has no regional wall motion abnormalities. There is mild left ventricular hypertrophy. Left ventricular diastolic parameters are consistent with Grade II diastolic dysfunction (pseudonormalization).  2. Mildly D-shaped interventricular septum suggestive of RV pressure/volume overload. Right ventricular systolic function is mildly reduced. The right ventricular size is mildly enlarged.  3. The mitral valve is normal in structure. Trivial mitral valve regurgitation. No evidence of mitral stenosis.  4. The aortic valve is tricuspid. There is mild calcification of the aortic valve. Aortic valve regurgitation is not visualized. No aortic stenosis is present.  5. Peak RV-RA gradient 21 mmHg, IVC not visualized. FINDINGS  Left Ventricle: Left ventricular ejection fraction, by estimation, is 55 to 60%. The left ventricle has normal function. The left ventricle has no regional wall motion abnormalities. The left ventricular internal cavity size was normal in size. There is  mild left ventricular hypertrophy. Left ventricular diastolic parameters are consistent with Grade II diastolic dysfunction (pseudonormalization). Right Ventricle: Mildly D-shaped interventricular septum suggestive of RV pressure/volume overload. The right ventricular size is mildly enlarged. No increase in right ventricular wall thickness. Right ventricular systolic function is mildly reduced. Left Atrium: Left atrial size was normal in size. Right Atrium: Right atrial size was normal in size. Pericardium:  There is no evidence of pericardial effusion. Mitral Valve: The mitral valve is normal in structure. There is mild calcification of the mitral valve leaflet(s). Trivial mitral valve regurgitation. No evidence of mitral  valve stenosis. Tricuspid Valve: The tricuspid valve is normal in structure. Tricuspid valve regurgitation is trivial. Aortic Valve: The aortic valve is tricuspid. There is mild calcification of the aortic valve. Aortic valve regurgitation is not visualized. No aortic stenosis is present. Aortic valve peak gradient measures 5.9 mmHg. Pulmonic Valve: The pulmonic valve was normal in structure. Pulmonic valve regurgitation is trivial. Aorta: The aortic root is normal in size and structure. Venous: The inferior vena cava was not well visualized. IAS/Shunts: No atrial level shunt detected by color flow Doppler.  LEFT VENTRICLE PLAX 2D LVIDd:         4.20 cm     Diastology LVIDs:         2.90 cm     LV e' medial:    6.22 cm/s LV PW:         1.10 cm     LV E/e' medial:  11.8 LV IVS:        1.20 cm     LV e' lateral:   8.55 cm/s LVOT diam:     2.00 cm     LV E/e' lateral: 8.6 LV SV:         53 LV SV Index:   24 LVOT Area:     3.14 cm  LV Volumes (MOD) LV vol d, MOD A2C: 93.4 ml LV vol d, MOD A4C: 73.7 ml LV vol s, MOD A2C: 40.3 ml LV vol s, MOD A4C: 31.7 ml LV SV MOD A2C:     53.1 ml LV SV MOD A4C:     73.7 ml LV SV MOD BP:      50.4 ml RIGHT VENTRICLE RV Basal diam:  3.10 cm RV Mid diam:    3.00 cm RV S prime:     5.73 cm/s TAPSE (M-mode): 1.8 cm LEFT ATRIUM             Index        RIGHT ATRIUM           Index LA diam:        3.60 cm 1.63 cm/m   RA Area:     14.90 cm LA Vol (A2C):   56.7 ml 25.67 ml/m  RA Volume:   35.20 ml  15.94 ml/m LA Vol (A4C):   63.2 ml 28.61 ml/m LA Biplane Vol: 62.3 ml 28.21 ml/m  AORTIC VALVE AV Area (Vmax): 2.03 cm AV Vmax:        121.00 cm/s AV Peak Grad:   5.9 mmHg LVOT Vmax:      78.30 cm/s LVOT Vmean:     53.900 cm/s LVOT VTI:       0.169 m  AORTA Ao Root diam: 3.30 cm Ao Asc diam:  3.30 cm MITRAL VALVE               TRICUSPID VALVE MV Area (PHT): 3.61 cm    TR Peak grad:   21.0 mmHg MV Decel Time: 210 msec    TR Vmax:        229.00 cm/s MV E velocity: 73.70 cm/s MV A velocity:  52.90 cm/s  SHUNTS MV E/A ratio:  1.39        Systemic VTI:  0.17 m  Systemic Diam: 2.00 cm Dalton McleanMD Electronically signed by Franki Monte Signature Date/Time: 05/10/2022/3:30:54 PM    Final      Patient Profile     Luis Deleon is a 81 y.o. male with CAD, CKD, obesity who was admitted on 05/07/2022 for CABG.  Cardiology was consulted on 05/10/2022 for wide-complex tachycardia. Echo shows normal LV function, mild LVH, D shaped, mild RVE/RV dysfunction.   Assessment & Plan    1 WCT-irregular; felt to be atrial fibrillation with aberrancy. Continue IV amiodarone.  We will transition to oral amiodarone once rhythm more stable.  Note echocardiogram shows normal LV function.  Given recurrence of atrial fibrillation would initiate anticoagulation when okay with surgery.  Continue beta-blocker at present dose.  2 coronary artery disease status post coronary bypass graft-continue aspirin, beta-blocker and statin.  For questions or updates, please contact [place] Please consult [email or web address] for contact info under        Signed, Kirk Ruths, MD  05/11/2022, 9:40 AM

## 2022-05-11 NOTE — Care Management Important Message (Signed)
Important Message  Patient Details  Name: Luis Deleon MRN: [id number] Date of Birth: 1941/04/21   Medicare Important Message Given:  Yes     [name] 05/11/2022, 9:26 AM

## 2022-05-11 NOTE — Progress Notes (Signed)
CARDIAC REHAB PHASE I   PRE:  Rate/Rhythm: 106 ST with afib beats, moved to 136 afib with aberrancy    BP: sitting 120/75    SaO2: 96 2L  MODE:  Ambulation: 190 ft   POST:  Rate/Rhythm: 160 afib with aberrancy at max    BP: sitting 152/93     SaO2: 98 2L   Pt initially ST with short narrow complex runs afib in bed. However while still in bed pt moved to consistent afib with WCT/aberrancy after a few minutes. This maintained entire duration of walking then decreased with return to sitting in recliner.   Pt needed max assist to move to EOB following sternal precaution. C/o some dizziness with getting OOB but not severe. Pt stood with rocking and gait belt. Slow steady walk with RW, standby assist x2. Followed with recliner for rest. Pt sts dizziness decreased but did not resolve with walking, sts it was minimal. Pt HR slowly increased to 150s, pt asx. Asked him to sit and rest after 145 ft. HR decreased to 140s with rest. Pt stood again and walked back to room and to recliner. Pt c/o general fatigue from ambulation but overall tolerated well. HR began having more sinus beats as I was leaving, RN present.   Pt would benefit from PT c/s to help with bed mobility. He is motivated to get home to his wife. He does have hired help for his wife that can continue at home. His daughter helps with meals.  [id number]  [place], ACSM 05/11/2022 10:43 AM

## 2022-05-12 ENCOUNTER — Inpatient Hospital Stay (HOSPITAL_COMMUNITY): Payer: Medicare HMO

## 2022-05-12 MED ORDER — HYDROCHLOROTHIAZIDE 12.5 MG PO TABS
12.5000 mg | ORAL_TABLET | Freq: Every day | ORAL | Status: DC
  Administered 2022-05-12 – 2022-05-16 (×5): 12.5 mg via ORAL
  Filled 2022-05-12 (×5): qty 1

## 2022-05-12 MED ORDER — POTASSIUM CHLORIDE ER 10 MEQ PO TBCR
40.0000 meq | EXTENDED_RELEASE_TABLET | Freq: Once | ORAL | Status: AC
  Administered 2022-05-12: 40 meq via ORAL
  Filled 2022-05-12 (×3): qty 4

## 2022-05-12 NOTE — Progress Notes (Signed)
Mobility Specialist: Progress Note   05/12/22 1708  Mobility  Activity Ambulated with assistance in hallway  Level of Assistance Contact guard assist, steadying assist  Assistive Device Front wheel walker  Distance Ambulated (ft) 160 ft  Activity Response Tolerated well  $Mobility charge 1 Mobility   Pre-Mobility: 68 HR Post-Mobility: 77 HR  Received pt in bed having no complaints and agreeable to mobility. Pt was asymptomatic throughout ambulation and returned to room w/o fault. Left in bed w/ call bell in reach and all needs met.  [facility] Carsin Randazzo Mobility Specialist Mobility Specialist [address]: [phone]

## 2022-05-12 NOTE — Progress Notes (Signed)
CARDIAC REHAB PHASE I   PRE:  Rate/Rhythm: 82 SR with small runs of NSVT    BP: sitting 119/57    SaO2: 95 RA  MODE:  Ambulation: 128 ft   POST:  Rate/Rhythm: 82 SR with 4 beat run of NSVT    BP: sitting 135/63     SaO2: 97 RA  [id number] Patient getting up to chair x 2 assist upon arrival. Willing to ambulate. Patient states he feels stronger than yesterday. Steady gait with x2 assist, gait belt, and RW. Less SOB today. IV amio site infiltrated. Primary RN notified. Post ambulation patient back to chair with call bell, phone, and IS in reach. Patient utilizing IS hourly. Encouraged to ambulate 2 more times today. PT consult previously ordered.   Taegan Standage Minus Breeding RN, BSN

## 2022-05-12 NOTE — Progress Notes (Addendum)
      [address]       [place], [address]             [phone]      5 Days Post-Op Procedure(s) (LRB): CORONARY ARTERY BYPASS GRAFTING (CABG) X 3 , USING LEFT INTERNAL MAMMARY ARTERY AND RIGHT LEG GREATER SAPHENOUS VEIN HARVESTED ENDOSCOPICALLY (N/A) TRANSESOPHAGEAL ECHOCARDIOGRAM (TEE) (N/A)  Subjective:  Patient just finished walking with cardiac rehab.  Sitting up in chair and looks great.  He states he had a horrible coughing fit yesterday.  Stated he just coughed and wheezed non-stop.  The breathing treatment helped.  Objective: Vital signs in last 24 hours: Temp:  [97.5 F (36.4 C)-98.4 F (36.9 C)] 97.5 F (36.4 C) (07/29 0829) Pulse Rate:  [67-87] 76 (07/29 0829) Cardiac Rhythm: Normal sinus rhythm (07/28 2035) Resp:  [17-24] 19 (07/29 0829) BP: (102-146)/(54-82) 127/61 (07/29 0829) SpO2:  [93 %-98 %] 97 % (07/29 0829) Weight:  [101.5 kg] 101.5 kg (07/29 0400)  Intake/Output from previous day: 07/28 0701 - 07/29 0700 In: 720 [P.O.:720] Out: 1750 [Urine:1750]  General appearance: alert, cooperative, and no distress Heart: regular rate and rhythm Lungs: clear to auscultation bilaterally Abdomen: soft, non-tender; bowel sounds normal; no masses,  no organomegaly Extremities: edema trace Wound: clean and dyr  Lab Results: Recent Labs    05/10/22 0120  WBC 9.5  HGB 11.2*  HCT 32.7*  PLT 82*   BMET:  Recent Labs    05/10/22 0601 05/11/22 0119  NA 139 136  K 4.0 3.5  CL 106 106  CO2 27 23  GLUCOSE 107* 93  BUN 15 15  CREATININE 1.26* 1.08  CALCIUM 8.2* 7.7*    PT/INR: No results for input(s): "LABPROT", "INR" in the last 72 hours. ABG    Component Value Date/Time   PHART 7.358 05/08/2022 0308   HCO3 25.2 05/08/2022 0308   TCO2 26 05/08/2022 0308   ACIDBASEDEF 6.0 (H) 05/07/2022 2302   O2SAT 97 05/08/2022 0308   CBG (last 3)  Recent Labs    05/10/22 0602 05/10/22 2119 05/11/22 0542  GLUCAP 116* 87 73     Assessment/Plan: S/P Procedure(s) (LRB): CORONARY ARTERY BYPASS GRAFTING (CABG) X 3 , USING LEFT INTERNAL MAMMARY ARTERY AND RIGHT LEG GREATER SAPHENOUS VEIN HARVESTED ENDOSCOPICALLY (N/A) TRANSESOPHAGEAL ECHOCARDIOGRAM (TEE) (N/A)  CV- NSR this morning- will stop IV Amiodarone, continue oral at 400 mg BID, Lopressor Pulm- off oxygen, episode of coughing/wheezing yesterday, better after breathing treatment, able to cough up some sputum, continue IS Renal- creatinine has been stable, continue Lasix, potassium will check BMET in AM Deconditioning- ambulating with cardiac rehab Dispo- patient stable, in NSR will stop IV Amiodarone today, d/c EPW, continue Lasix, potassium....hopefully patient's rhythm will remain stable if so for possible d/c in next 24-48 hours   LOS: 5 days    Ellwood Handler, PA-C 05/12/2022  Patient seen and examined, agree with above Looks better this AM Plan as outlined above  Remo Lipps C. Roxan Hockey, MD [facility] [phone]

## 2022-05-12 NOTE — Progress Notes (Signed)
Removed epicardial pacing wire per order, all ends intact. Patient tolerated well. Patient educated on one hour bedrest. Patient verbalized understanding.  Daymon Larsen, RN

## 2022-05-12 NOTE — Progress Notes (Signed)
Patient experienced 10 beat run of Vtach. HR 108-140. Patient asymptomatic. Metoprolol 2.5 mg IV given. Oncoming RN notified.  Daymon Larsen, RN

## 2022-05-13 DIAGNOSIS — Z951 Presence of aortocoronary bypass graft: Secondary | ICD-10-CM | POA: Diagnosis not present

## 2022-05-13 DIAGNOSIS — I480 Paroxysmal atrial fibrillation: Secondary | ICD-10-CM | POA: Diagnosis not present

## 2022-05-13 DIAGNOSIS — I2510 Atherosclerotic heart disease of native coronary artery without angina pectoris: Secondary | ICD-10-CM

## 2022-05-13 LAB — BASIC METABOLIC PANEL
Anion gap: 9 (ref 5–15)
BUN: 18 mg/dL (ref 8–23)
CO2: 27 mmol/L (ref 22–32)
Calcium: 8.7 mg/dL — ABNORMAL LOW (ref 8.9–10.3)
Chloride: 102 mmol/L (ref 98–111)
Creatinine, Ser: 1.11 mg/dL (ref 0.61–1.24)
GFR, Estimated: >60 mL/min (ref >60)
Glucose, Bld: 93 mg/dL (ref 70–99)
Potassium: 3.9 mmol/L (ref 3.5–5.1)
Sodium: 138 mmol/L (ref 135–145)

## 2022-05-13 MED ORDER — AMIODARONE HCL IN DEXTROSE 360-4.14 MG/200ML-% IV SOLN
60.0000 mg/h | INTRAVENOUS | Status: AC
  Administered 2022-05-13: 60 mg/h via INTRAVENOUS
  Filled 2022-05-13: qty 200

## 2022-05-13 MED ORDER — METOPROLOL TARTRATE 100 MG PO TABS
100.0000 mg | ORAL_TABLET | Freq: Two times a day (BID) | ORAL | Status: DC
  Administered 2022-05-13 – 2022-05-15 (×5): 100 mg via ORAL
  Filled 2022-05-13 (×5): qty 1

## 2022-05-13 MED ORDER — ASPIRIN 81 MG PO TBEC
81.0000 mg | DELAYED_RELEASE_TABLET | Freq: Every day | ORAL | Status: DC
  Administered 2022-05-13 – 2022-05-17 (×5): 81 mg via ORAL
  Filled 2022-05-13 (×5): qty 1

## 2022-05-13 MED ORDER — APIXABAN 5 MG PO TABS
5.0000 mg | ORAL_TABLET | Freq: Two times a day (BID) | ORAL | Status: DC
  Administered 2022-05-13 – 2022-05-17 (×9): 5 mg via ORAL
  Filled 2022-05-13 (×9): qty 1

## 2022-05-13 MED ORDER — AMIODARONE HCL IN DEXTROSE 360-4.14 MG/200ML-% IV SOLN
30.0000 mg/h | INTRAVENOUS | Status: DC
  Administered 2022-05-13 – 2022-05-14 (×2): 30 mg/h via INTRAVENOUS
  Filled 2022-05-13 (×2): qty 200

## 2022-05-13 NOTE — Progress Notes (Signed)
Patient HR 110-150s. Vtach. Patient stated he feels flutters in his chest. Metoprolol PO given. PA notified. Amioderone IV ordered and started. Will continue to monitor.  Daymon Larsen, RN

## 2022-05-13 NOTE — Progress Notes (Addendum)
      [address]       [place] [address]             [phone]      6 Days Post-Op Procedure(s) (LRB): CORONARY ARTERY BYPASS GRAFTING (CABG) X 3 , USING LEFT INTERNAL MAMMARY ARTERY AND RIGHT LEG GREATER SAPHENOUS VEIN HARVESTED ENDOSCOPICALLY (N/A) TRANSESOPHAGEAL ECHOCARDIOGRAM (TEE) (N/A)  Subjective:  Patient doesn't feel as well today.  States he feels feverish.    Objective: Vital signs in last 24 hours: Temp:  [97.5 F (36.4 C)-98.3 F (36.8 C)] 97.7 F (36.5 C) (07/30 0321) Pulse Rate:  [58-95] 68 (07/30 0321) Cardiac Rhythm: Atrial fibrillation (07/29 2039) Resp:  [16-20] 16 (07/30 0321) BP: (100-146)/(55-81) 133/74 (07/30 0321) SpO2:  [91 %-97 %] 94 % (07/30 0321) Weight:  [103.6 kg] 103.6 kg (07/30 0321)  Intake/Output from previous day: 07/29 0701 - 07/30 0700 In: 720 [P.O.:720] Out: 2300 [Urine:2300]  General appearance: alert, cooperative, and no distress Heart: regular rate and rhythm Lungs: clear to auscultation bilaterally Abdomen: soft, non-tender; bowel sounds normal; no masses,  no organomegaly Extremities: edema trace Wound: clean and dry  Lab Results: No results for input(s): "WBC", "HGB", "HCT", "PLT" in the last 72 hours. BMET:  Recent Labs    05/11/22 0119 05/13/22 0327  NA 136 138  K 3.5 3.9  CL 106 102  CO2 23 27  GLUCOSE 93 93  BUN 15 18  CREATININE 1.08 1.11  CALCIUM 7.7* 8.7*    PT/INR: No results for input(s): "LABPROT", "INR" in the last 72 hours. ABG    Component Value Date/Time   PHART 7.358 05/08/2022 0308   HCO3 25.2 05/08/2022 0308   TCO2 26 05/08/2022 0308   ACIDBASEDEF 6.0 (H) 05/07/2022 2302   O2SAT 97 05/08/2022 0308   CBG (last 3)  Recent Labs    05/10/22 2119 05/11/22 0542  GLUCAP 87 73    Assessment/Plan: S/P Procedure(s) (LRB): CORONARY ARTERY BYPASS GRAFTING (CABG) X 3 , USING LEFT INTERNAL MAMMARY ARTERY AND RIGHT LEG GREATER SAPHENOUS VEIN HARVESTED ENDOSCOPICALLY  (N/A) TRANSESOPHAGEAL ECHOCARDIOGRAM (TEE) (N/A)  CV- maintaining NSR with LBBB, he did have some runs of VTAC, and dropped beat overnight- continue Amiodarone, Lopressor, will start Eliquis today with pacing wires out Pulm- off oxygen, no acute issues, CXR w/o effusions, pneumothorax Renal- creatinine stable, K is improved to 3.7, weight is trending down and is below baseline, will continue low dose Lasix for now, potassium ID- patient feels feverish, he is afebrile, with no signs of acute infection Deconditioning- continue ambulation Dispo- patient doesn't feel as well today, continue to monitor rhythm he is in NSR, continue Lasix, potassium for now.... if remains clinically stable and feels better tomorrow will plan for d/c   LOS: 6 days   Addendum:  Mr. Lanum unfortunately developed Atrial Fibrillation with fast rate later in the morning.  I spoke with Cardiology who recommended further titration of BB and restart Amiodarone drip.   Erin Barrett, PA-C 05/13/2022  Back into AF with RVR this AM Lopressor increased, back on IV amiodarone Will ask Dr. Curt Bears to evaluate Will start [place]. Roxan Hockey, MD [facility] [phone]

## 2022-05-13 NOTE — Progress Notes (Signed)
Patient current heart rate 63 bpm, in normal sinus rhythm. Patient resting comfortably in bed.  Daymon Larsen, RN

## 2022-05-13 NOTE — Progress Notes (Signed)
Mobility Specialist: Progress Note   05/13/22 1724  Mobility  Activity Ambulated with assistance in hallway  Level of Assistance Minimal assist, patient does 75% or more  Assistive Device Front wheel walker  Distance Ambulated (ft) 280 ft  Activity Response Tolerated well  $Mobility charge 1 Mobility   Pre-Mobility: 68 HR, 97% SpO2 Post-Mobility: 75 HR, 95% SpO2  Received pt in bed having no complaints and agreeable to mobility. MinA with bed mobility and contact guard during ambulation. Pt was asymptomatic throughout ambulation and returned to room w/o fault. Left in bed w/ call bell in reach and all needs met.  [facility] Neisha Hinger Mobility Specialist Mobility Specialist [address]: [phone]

## 2022-05-13 NOTE — Progress Notes (Signed)
ANTICOAGULATION CONSULT NOTE - Initial Consult  Pharmacy Consult for apixaban Indication: atrial fibrillation  Allergies  Allergen Reactions   Colchicine Diarrhea, Nausea And Vomiting and Other (See Comments)    REACTION: nausea, diarrhea and interactions with 33m   Protamine Other (See Comments)    Likely mild protamine reaction with hypotension during CABG 2023    Patient Measurements: Height: 5' 8"  (172.7 cm) Weight: 103.6 kg (228 lb 6.3 oz) IBW/kg (Calculated) : 68.4  Vital Signs: Temp: 97.7 F (36.5 C) (07/30 0321) Temp Source: Oral (07/30 0321) BP: 133/74 (07/30 0321) Pulse Rate: 68 (07/30 0321)  Labs: Recent Labs    05/11/22 0119 05/13/22 0327  CREATININE 1.08 1.11    Estimated Creatinine Clearance: 64 mL/min (by C-G formula based on SCr of 1.11 mg/dL).   Medical History: Past Medical History:  Diagnosis Date   Arthritis    Balanoposthitis    Cancer ([facility] 2013   prostate   CKD (chronic kidney disease), stage III ([place]    Coronary artery disease    Crohn disease ([place]    followed by dr pHilarie Fredrickson-- dx 1984, terminal ileum-- prolonged clinical remission since bowel resection 2003   Diverticulosis    Dyspnea    with exertion   Edema of both lower extremities    Erythema    penis   Fatty liver    followed by dr pHilarie Fredrickson  Gout    12-08-2020  per pt last episode approx. 2016  right great toe   Hepatic cyst    History of adenomatous polyp of colon    History of kidney stones    History of prostate cancer    11-16-2004  s/p  radical prostatectomy with pelvic lymph node dissection   History of small bowel obstruction 2003   s/p  bowel resection   Hypertension    followed by pcp   (nuclear study 01-14-2018 in epic low risk without ischemia , nuclear ef 51% per cardiology note from dr berry)   Intermittent abdominal pain    followed by dr pHilarie Fredrickson--  due to adhesions and partial sbo   Internal hemorrhoids    Mesenteric artery stenosis ([facility]    vascular--  dr bTrula Slade  Mixed hyperlipidemia    Multinodular thyroid    Benign bilateral bx's 08-09-2010 ;  last ultrasound in epic 09-19-2011    Nephrolithiasis    Polycythemia    currently followed by pcp;   previously seen by hematologist/ oncology--- dr mJerilynn Mages mJulien Nordmann  lov in epic 01-28-2013  hx phlebotomy and donates blood   PSVT (paroxysmal supraventricular tachycardia) ([facility]    cardiologist--- dr bGwenlyn Found; episdoe at time of prostatectomy 2006 and recurrent 03/ 2019;  event monitor 01-15-2018 showed NSR,  echo 12-20-2017 with ef 60-65% mild LVH mild TR   Renal cyst, acquired    urologist-- dr eJunious Silk  Vitamin D deficiency     Medications:  Facility-Administered Medications Prior to Admission  Medication Dose Route Frequency Provider Last Rate Last Admin   sodium chloride flush (NS) 0.9 % injection 3 mL  3 mL Intravenous Q12H BLorretta Harp MD       Medications Prior to Admission  Medication Sig Dispense Refill Last Dose   allopurinol (ZYLOPRIM) 100 MG tablet Take 1 tablet (100 mg total) by mouth 2 (two) times daily. 180 tablet 3 05/07/2022 at 0330   aspirin 81 MG EC tablet Take 81 mg by mouth every evening.   05/06/2022   atorvastatin (LIPITOR) 40 MG tablet  TAKE 1 TABLET BY MOUTH DAILY AT 6 PM. 90 tablet 3 05/06/2022   Cholecalciferol (VITAMIN D3) 125 MCG (5000 UT) CAPS Take 5,000 Units by mouth See admin instructions. Take 1 tablet by mouth every day except on Sundays.   Past Week   gemfibrozil (LOPID) 600 MG tablet Take 1 tablet (600 mg total) by mouth 3 (three) times a week. Monday , Wednesday, Friday 39 tablet 3 Past Week   hydrochlorothiazide (HYDRODIURIL) 12.5 MG tablet Take 1 tablet (12.5 mg total) by mouth daily. 90 tablet 3 05/06/2022   losartan (COZAAR) 25 MG tablet Take 1 tablet (25 mg total) by mouth daily. 90 tablet 3 05/06/2022   metoprolol tartrate (LOPRESSOR) 50 MG tablet TAKE 1 TABLET BY MOUTH TWICE A DAY 180 tablet 3 05/07/2022 at 0330   Omega-3 Fatty Acids (OMEGA-3 FISH OIL)  1200 MG CAPS Take 2,400 mg by mouth in the morning and at bedtime.   Past Week   potassium chloride SA (KLOR-CON M20) 20 MEQ tablet Take 2 tablets (40 mEq total) by mouth daily. 180 tablet 3 Past Week   baclofen (LIORESAL) 10 MG tablet TAKE 1 TABLET BY MOUTH AT BEDTIME AS NEEDED FOR MUSCLE SPASMS. 90 tablet 3 More than a month   HYDROmorphone (DILAUDID) 2 MG tablet Take 2 mg by mouth daily as needed for severe pain.   More than a month   nystatin-triamcinolone (MYCOLOG II) cream Apply 1 application topically 2 (two) times daily. (Patient taking differently: Apply 1 application  topically 2 (two) times daily as needed (rash).) 30 g 3 Unknown    Assessment: 81 YO male presenting for CABG on 7/24 with two-vessel coronary disease. He has had several episodes of afib post-op and is now on amiodarone for rhythm control. Pharmacy consulted for apixaban dosing.   7/27 hgb stable, plts 82. No signs/symptoms of bleeding documented.   Goal of Therapy:  Monitor platelets by anticoagulation protocol: Yes   Plan:  Initiate apixaban 54m BID  F/u CBC and signs/symptoms of bleeding daily  Patient education to be provided by pharmacy prior to discharge   SBilley Gosling PharmD PGY1 Pharmacy Resident  7/30/20238:11 AM

## 2022-05-13 NOTE — Progress Notes (Addendum)
Progress Note  Patient Name: Luis Deleon Date of Encounter: 05/13/2022  [facility] Cardiologist: Quay Burow, MD   Subjective   Feeling mildly fatigued and short of breath.  He started feeling this way last night.  Went into atrial fibrillation this morning.  Inpatient Medications    Scheduled Meds:  allopurinol  100 mg Oral BID   apixaban  5 mg Oral BID   aspirin EC  81 mg Oral Daily   atorvastatin  40 mg Oral Daily   benzonatate  100 mg Oral TID   bisacodyl  10 mg Oral Daily   Or   bisacodyl  10 mg Rectal Daily   [facility] Family Education   Does not apply Once   furosemide  40 mg Oral Daily   gemfibrozil  600 mg Oral Once per day on Mon Wed Fri   guaiFENesin  600 mg Oral BID   hydrochlorothiazide  12.5 mg Oral Daily   metoprolol tartrate  100 mg Oral BID   pantoprazole  40 mg Oral Daily   potassium chloride  40 mEq Oral Daily   sodium chloride flush  3 mL Intravenous Q12H   Continuous Infusions:  sodium chloride     amiodarone 60 mg/hr (05/13/22 0911)   amiodarone     PRN Meds: sodium chloride, alum & mag hydroxide-simeth, baclofen, levalbuterol, magnesium hydroxide, metoprolol tartrate, nystatin-triamcinolone, ondansetron (ZOFRAN) IV, oxyCODONE, sodium chloride flush, traMADol   Vital Signs    Vitals:   05/12/22 1942 05/12/22 2306 05/13/22 0321 05/13/22 0855  BP: 120/76 (!) 100/55 133/74 138/85  Pulse: 95 (!) 58 68 (!) 119  Resp: 20 19 16 17   Temp: 98.1 F (36.7 C) 97.7 F (36.5 C) 97.7 F (36.5 C) 97.8 F (36.6 C)  TempSrc: Oral Oral Oral Oral  SpO2: 91% 94% 94% 91%  Weight:   103.6 kg   Height:        Intake/Output Summary (Last 24 hours) at 05/13/2022 1053 Last data filed at 05/13/2022 0321 Gross per 24 hour  Intake 480 ml  Output 2300 ml  Net -1820 ml       05/13/2022    3:21 AM 05/12/2022    4:00 AM 05/11/2022    4:10 AM  Last 3 Weights  Weight (lbs) 228 lb 6.3 oz 223 lb 12.3 oz 236 lb 1.8 oz   Weight (kg) 103.6 kg 101.5 kg 107.1 kg      Telemetry    Atrial fibrillation with aberrancy-personally reviewed  Physical Exam   GEN: Well nourished, well developed, in no acute distress  HEENT: normal  Neck: no JVD, carotid bruits, or masses Cardiac: irregular; no murmurs, rubs, or gallops,no edema  Respiratory:  clear to auscultation bilaterally, normal work of breathing GI: soft, nontender, nondistended, + BS MS: no deformity or atrophy  Skin: warm and dry, median sternotomy scar  neuro:  Strength and sensation are intact Psych: euthymic mood, full affect   Labs     Chemistry Recent Labs  Lab 05/08/22 0300 05/08/22 0308 05/08/22 1636 05/09/22 0302 05/10/22 0601 05/11/22 0119 05/13/22 0327  NA 141   < > 140   < > 139 136 138  K 3.7   < > 3.7   < > 4.0 3.5 3.9  CL 110  --  106   < > 106 106 102  CO2 24  --  25   < > 27 23 27   GLUCOSE 117*  --  132*   < >  107* 93 93  BUN 14  --  15   < > 15 15 18   CREATININE 1.19  --  1.20   < > 1.26* 1.08 1.11  CALCIUM 7.6*  --  7.9*   < > 8.2* 7.7* 8.7*  MG 2.5*  --  2.3  --  2.1  --   --   GFRNONAA >60  --  >60   < > 58* >60 >60  ANIONGAP 7  --  9   < > 6 7 9    < > = values in this interval not displayed.      Hematology Recent Labs  Lab 05/08/22 1636 05/09/22 0302 05/10/22 0120  WBC 12.0* 10.3 9.5  RBC 3.69* 3.47* 3.45*  HGB 12.1* 11.4* 11.2*  HCT 35.5* 33.7* 32.7*  MCV 96.2 97.1 94.8  MCH 32.8 32.9 32.5  MCHC 34.1 33.8 34.3  RDW 14.9 15.3 15.1  PLT 73* 67* 82*    Thyroid  Recent Labs  Lab 05/09/22 0302  TSH 1.957      Radiology    DG Chest 2 View  Result Date: 05/12/2022 CLINICAL DATA:  Status post CABG. EXAM: CHEST - 2 VIEW COMPARISON:  05/09/2022 FINDINGS: RIGHT IJ central venous catheter sheath has been removed. Defibrillator/pacing pads overlying the LEFT chest are noted. UPPER limits normal heart size CABG changes again noted. LEFT basilar opacity/atelectasis and small bilateral pleural  effusions are again noted. There is no evidence of pneumothorax. IMPRESSION: RIGHT IJ central venous catheter sheath removal without other significant change. Electronically Signed   By: Margarette Canada M.D.   On: 05/12/2022 12:09     Patient Profile     KYAL ARTS is a 81 y.o. male with CAD, CKD, obesity who was admitted on 05/07/2022 for CABG.  Cardiology was consulted on 05/10/2022 for wide-complex tachycardia. Echo shows normal LV function, mild LVH, D shaped, mild RVE/RV dysfunction.   Assessment & Plan    1.  Paroxysmal atrial fibrillation/flutter: In review of ECGs, this is likely due to aberrant conduction.  He is currently on IV amiodarone, restarted this morning due to going back into atrial fibrillation.  Eliquis has been started today.  We Wilfrido Luedke continue IV amiodarone throughout the night tonight.  He Andrius Andrepont likely convert back to normal rhythm.  He likely needs more amiodarone to maintain sinus rhythm.  His rate control is improved with higher dose of metoprolol.  We Debbie Bellucci continue 100 mg twice daily.  If he continues to have rapid rates, he may need TEE and cardioversion prior to discharge.  If heart rates can be controlled, he can be discharged on amiodarone, metoprolol with follow-up in the atrial fibrillation clinic to further discuss rhythm control.  2.  Coronary artery disease: Status post CABG.  Continue therapy with aspirin, beta-blocker, statin.   For questions or updates, please contact [place] Please consult [email or web address] for contact info under        Signed, Vangie Henthorn Meredith Leeds, MD  05/13/2022, 10:53 AM

## 2022-05-14 DIAGNOSIS — Z951 Presence of aortocoronary bypass graft: Secondary | ICD-10-CM | POA: Diagnosis not present

## 2022-05-14 LAB — CBC
HCT: 41.1 % (ref 39.0–52.0)
Hemoglobin: 13.9 g/dL (ref 13.0–17.0)
MCH: 32.1 pg (ref 26.0–34.0)
MCHC: 33.8 g/dL (ref 30.0–36.0)
MCV: 94.9 fL (ref 80.0–100.0)
Platelets: 234 10*3/uL (ref 150–400)
RBC: 4.33 MIL/uL (ref 4.22–5.81)
RDW: 15.1 % (ref 11.5–15.5)
WBC: 11 10*3/uL — ABNORMAL HIGH (ref 4.0–10.5)
nRBC: 0 % (ref 0.0–0.2)

## 2022-05-14 MED ORDER — AMIODARONE HCL 200 MG PO TABS
400.0000 mg | ORAL_TABLET | Freq: Two times a day (BID) | ORAL | Status: DC
  Administered 2022-05-14 – 2022-05-15 (×4): 400 mg via ORAL
  Filled 2022-05-14 (×4): qty 2

## 2022-05-14 MED ORDER — AMIODARONE HCL 200 MG PO TABS: 200.0000 mg | ORAL_TABLET | Freq: Two times a day (BID) | ORAL | Status: DC

## 2022-05-14 MED ORDER — CEPHALEXIN 500 MG PO CAPS
500.0000 mg | ORAL_CAPSULE | Freq: Three times a day (TID) | ORAL | Status: DC
  Administered 2022-05-14 – 2022-05-16 (×8): 500 mg via ORAL
  Filled 2022-05-14 (×8): qty 1

## 2022-05-14 NOTE — Progress Notes (Signed)
Mobility Specialist Progress Note   05/14/22 1727  Mobility  Activity Ambulated with assistance in hallway  Level of Assistance Minimal assist, patient does 75% or more  Assistive Device Front wheel walker  Distance Ambulated (ft) 210 ft  Activity Response Tolerated well  $Mobility charge 1 Mobility   Post Mobility: 62 HR, 132/83 BP  Received in chair getting dressing applied by RN. Having no initial complaints but stating lightheadedness during ambulation ,required x1 standing rest break. Pt able to continue and return back to bed w/o fault, left supine w/ call bell in reach and needs met.   Pt unable to recall sternal precautions reeducated while in bed.     Mobility Specialist [place] #:  [phone] Acute Rehab Office:  [phone]  

## 2022-05-14 NOTE — Progress Notes (Signed)
Progress Note  Patient Name: Luis Deleon Date of Encounter: 05/14/2022  Primary Cardiologist:   Quay Burow, MD   Subjective   Feels OK.  Dizzy with ambulation.  No pain.  No SOB.   Inpatient Medications    Scheduled Meds:  allopurinol  100 mg Oral BID   amiodarone  400 mg Oral BID   apixaban  5 mg Oral BID   aspirin EC  81 mg Oral Daily   atorvastatin  40 mg Oral Daily   benzonatate  100 mg Oral TID   bisacodyl  10 mg Oral Daily   Or   bisacodyl  10 mg Rectal Daily   cephALEXin  500 mg Oral Q8H   furosemide  40 mg Oral Daily   gemfibrozil  600 mg Oral Once per day on Mon Wed Fri   guaiFENesin  600 mg Oral BID   hydrochlorothiazide  12.5 mg Oral Daily   metoprolol tartrate  100 mg Oral BID   pantoprazole  40 mg Oral Daily   potassium chloride  40 mEq Oral Daily   sodium chloride flush  3 mL Intravenous Q12H   Continuous Infusions:  sodium chloride     PRN Meds: sodium chloride, alum & mag hydroxide-simeth, baclofen, levalbuterol, magnesium hydroxide, metoprolol tartrate, nystatin-triamcinolone, ondansetron (ZOFRAN) IV, oxyCODONE, sodium chloride flush, traMADol   Vital Signs    Vitals:   05/13/22 2142 05/14/22 0010 05/14/22 0500 05/14/22 0805  BP: 102/80 122/67 (!) 136/96 (!) 123/57  Pulse: 82 (!) 56 60 68  Resp: 19 19 20 16   Temp: 98.6 F (37 C) 98.1 F (36.7 C) 98.1 F (36.7 C) 98.3 F (36.8 C)  TempSrc: Oral Oral Oral Oral  SpO2: 95% 93% 95% 90%  Weight:   103.7 kg   Height:        Intake/Output Summary (Last 24 hours) at 05/14/2022 0939 Last data filed at 05/14/2022 0500 Gross per 24 hour  Intake 209 ml  Output 2300 ml  Net -2091 ml   Filed Weights   05/12/22 0400 05/13/22 0321 05/14/22 0500  Weight: 101.5 kg 103.6 kg 103.7 kg    Telemetry    NSR - Personally Reviewed  ECG    NA - Personally Reviewed  Physical Exam   GEN: No acute distress.   Neck: No  JVD Cardiac: RRR, no murmurs, rubs, or gallops.  Respiratory: Clear  to  auscultation bilaterally. GI: Soft, nontender, non-distended  MS: No  edema; No deformity. Neuro:  Nonfocal  Psych: Normal affect   Labs    Chemistry Recent Labs  Lab 05/10/22 0601 05/11/22 0119 05/13/22 0327  NA 139 136 138  K 4.0 3.5 3.9  CL 106 106 102  CO2 27 23 27   GLUCOSE 107* 93 93  BUN 15 15 18   CREATININE 1.26* 1.08 1.11  CALCIUM 8.2* 7.7* 8.7*  GFRNONAA 58* >60 >60  ANIONGAP 6 7 9      Hematology Recent Labs  Lab 05/09/22 0302 05/10/22 0120 05/14/22 0403  WBC 10.3 9.5 11.0*  RBC 3.47* 3.45* 4.33  HGB 11.4* 11.2* 13.9  HCT 33.7* 32.7* 41.1  MCV 97.1 94.8 94.9  MCH 32.9 32.5 32.1  MCHC 33.8 34.3 33.8  RDW 15.3 15.1 15.1  PLT 67* 82* 234    Cardiac EnzymesNo results for input(s): "TROPONINI" in the last 168 hours. No results for input(s): "TROPIPOC" in the last 168 hours.   BNPNo results for input(s): "BNP", "PROBNP" in the last 168 hours.   DDimer  No results for input(s): "DDIMER" in the last 168 hours.   Radiology    DG Chest 2 View  Result Date: 05/12/2022 CLINICAL DATA:  Status post CABG. EXAM: CHEST - 2 VIEW COMPARISON:  05/09/2022 FINDINGS: RIGHT IJ central venous catheter sheath has been removed. Defibrillator/pacing pads overlying the LEFT chest are noted. UPPER limits normal heart size CABG changes again noted. LEFT basilar opacity/atelectasis and small bilateral pleural effusions are again noted. There is no evidence of pneumothorax. IMPRESSION: RIGHT IJ central venous catheter sheath removal without other significant change. Electronically Signed   By: Margarette Canada M.D.   On: 05/12/2022 12:09    Cardiac Studies   ECHO:     1. Left ventricular ejection fraction, by estimation, is 55 to 60%. The  left ventricle has normal function. The left ventricle has no regional  wall motion abnormalities. There is mild left ventricular hypertrophy.  Left ventricular diastolic parameters  are consistent with Grade II diastolic dysfunction  (pseudonormalization).   2. Mildly D-shaped interventricular septum suggestive of RV  pressure/volume overload. Right ventricular systolic function is mildly  reduced. The right ventricular size is mildly enlarged.   3. The mitral valve is normal in structure. Trivial mitral valve  regurgitation. No evidence of mitral stenosis.   4. The aortic valve is tricuspid. There is mild calcification of the  aortic valve. Aortic valve regurgitation is not visualized. No aortic  stenosis is present.   5. Peak RV-RA gradient 21 mmHg, IVC not visualized.   Patient Profile     81 y.o. male a 81 y.o. male with CAD, CKD, obesity who was admitted on 05/07/2022 for CABG.  Cardiology was consulted on 05/10/2022 for wide-complex tachycardia.  Assessment & Plan    PAF:  Now in NSR.  Discontinue IV amiodarone and start oral.  Continue DOAC.  Continue beta blocker.  Has follow up in the atrial fib clinic.    CAD/CABG:    Follow up arranged.  Meds as on MAR.      RV Dysfunction:   We will follow clinically.  No overt evidence of RV volume overload.   For questions or updates, please contact [place] Please consult [email or web address] for contact info under Cardiology/STEMI.   Signed, Minus Breeding, MD  05/14/2022, 9:39 AM

## 2022-05-14 NOTE — Progress Notes (Addendum)
CARDIAC REHAB PHASE I   PRE:  Rate/Rhythm: 110 A-fib  BP:  Sitting: 123/57      SaO2: 97 RA  MODE:  Ambulation: 280 ft   POST:  Rate/Rhythm: 136 A-fib  BP:  Sitting: 123/58      SaO2: 97 RA   Seen pt from 0907-1010, pt was ambulated through hallways with minimal assistance using gait belt and RW. During ambulation, pt walked well, denies CP but had ongoing dizziness 4/10 that did not get worst. Upon return to room, pt sheets were soiled so I changed the sheets and took care of pt needs. Prior to leaving pt looks to be in NSR with rates in low 70s.   Christen Bame  10:08 AM 05/14/2022

## 2022-05-14 NOTE — Progress Notes (Addendum)
Seen pt from 1100-1123, pt was educated on restrictions, sternal precautions, heart healthy diet, IS use, and ex guidelines. Pt has walker for home use. PT will be helping pt and pt wife at home. Pt will be referred to CRP[facility] at [facility].   Luis Deleon 11:26 AM 05/14/2022

## 2022-05-14 NOTE — Progress Notes (Addendum)
      [address]       [place] [address]             [phone]      7 Days Post-Op Procedure(s) (LRB): CORONARY ARTERY BYPASS GRAFTING (CABG) X 3 , USING LEFT INTERNAL MAMMARY ARTERY AND RIGHT LEG GREATER SAPHENOUS VEIN HARVESTED ENDOSCOPICALLY (N/A) TRANSESOPHAGEAL ECHOCARDIOGRAM (TEE) (N/A)  Subjective:  Patient is feeling better this morning.  + ambulation   + BM  Objective: Vital signs in last 24 hours: Temp:  [97.8 F (36.6 C)-98.6 F (37 C)] 98.1 F (36.7 C) (07/31 0500) Pulse Rate:  [56-119] 60 (07/31 0500) Cardiac Rhythm: Normal sinus rhythm (07/30 1900) Resp:  [15-20] 20 (07/31 0500) BP: (102-138)/(45-96) 136/96 (07/31 0500) SpO2:  [91 %-96 %] 95 % (07/31 0500) Weight:  [103.7 kg] 103.7 kg (07/31 0500)  Intake/Output from previous day: 07/30 0701 - 07/31 0700 In: 209 [I.V.:209] Out: 2300 [Urine:2300]  General appearance: alert, cooperative, and no distress Heart: regular rate and rhythm Lungs: clear to auscultation bilaterally Abdomen: soft, non-tender; bowel sounds normal; no masses,  no organomegaly Extremities: edema trace Wound: clean and dry  Lab Results: Recent Labs    05/14/22 0403  WBC 11.0*  HGB 13.9  HCT 41.1  PLT 234   BMET:  Recent Labs    05/13/22 0327  NA 138  K 3.9  CL 102  CO2 27  GLUCOSE 93  BUN 18  CREATININE 1.11  CALCIUM 8.7*    PT/INR: No results for input(s): "LABPROT", "INR" in the last 72 hours. ABG    Component Value Date/Time   PHART 7.358 05/08/2022 0308   HCO3 25.2 05/08/2022 0308   TCO2 26 05/08/2022 0308   ACIDBASEDEF 6.0 (H) 05/07/2022 2302   O2SAT 97 05/08/2022 0308   CBG (last 3)  No results for input(s): "GLUCAP" in the last 72 hours.  Assessment/Plan: S/P Procedure(s) (LRB): CORONARY ARTERY BYPASS GRAFTING (CABG) X 3 , USING LEFT INTERNAL MAMMARY ARTERY AND RIGHT LEG GREATER SAPHENOUS VEIN HARVESTED ENDOSCOPICALLY (N/A) TRANSESOPHAGEAL ECHOCARDIOGRAM (TEE) (N/A)  CV- A.  Fib/flutter,with LBBB with Aberrancy- now in NSR with PAC/PVC- continue Lopressor at 100 mg BID, stop IV amiodarone and start oral at 400 mg BID, continue Eliquis, Qtc is documented at 622, however corrected for LBBB is 463 Pulm- no acute issues, continue IS Renal- creatinine is stable, weight is below baseline, continue Lasic, potassium ID- RUE phlebitis from infiltrated IV due to Amiodarone, will start Keflex Deconditioning- patient ambulating w/o difficulty Dispo- patient feels better today, some A. Fib yesterday again treated with Amiodarone drip, titration of BB, Cardiology helping with management, continue Lopressor, Amiodarone, Eliquis, home once rhythm remains stable   LOS: 7 days    Ellwood Handler, PA-C 05/14/2022 Patient seen and examined, agree with assessment and plan outlined above Change amiodarone to PO Possibly home tomorrow if rhythm stable  Remo Lipps C. Roxan Hockey, MD [facility] [phone]

## 2022-05-15 DIAGNOSIS — Z951 Presence of aortocoronary bypass graft: Secondary | ICD-10-CM | POA: Diagnosis not present

## 2022-05-15 LAB — CBC
HCT: 43.7 % (ref 39.0–52.0)
Hemoglobin: 14.8 g/dL (ref 13.0–17.0)
MCH: 31.6 pg (ref 26.0–34.0)
MCHC: 33.9 g/dL (ref 30.0–36.0)
MCV: 93.2 fL (ref 80.0–100.0)
Platelets: 234 10*3/uL (ref 150–400)
RBC: 4.69 MIL/uL (ref 4.22–5.81)
RDW: 15 % (ref 11.5–15.5)
WBC: 12.2 10*3/uL — ABNORMAL HIGH (ref 4.0–10.5)
nRBC: 0 % (ref 0.0–0.2)

## 2022-05-15 MED ORDER — LACTULOSE 10 GM/15ML PO SOLN
20.0000 g | Freq: Once | ORAL | Status: AC
  Administered 2022-05-15: 20 g via ORAL
  Filled 2022-05-15: qty 30

## 2022-05-15 MED ORDER — APIXABAN 5 MG PO TABS: 5.0000 mg | ORAL_TABLET | Freq: Two times a day (BID) | ORAL | 3 refills | Status: AC

## 2022-05-15 MED ORDER — CEPHALEXIN 500 MG PO CAPS: 500.0000 mg | ORAL_CAPSULE | Freq: Three times a day (TID) | ORAL | 0 refills | Status: DC

## 2022-05-15 MED ORDER — AMIODARONE HCL 400 MG PO TABS: 400.0000 mg | ORAL_TABLET | Freq: Two times a day (BID) | ORAL | 1 refills | Status: DC

## 2022-05-15 MED ORDER — TRAMADOL HCL 50 MG PO TABS
50.0000 mg | ORAL_TABLET | Freq: Four times a day (QID) | ORAL | 0 refills | Status: AC | PRN
Start: 2022-05-15 — End: ?

## 2022-05-15 MED ORDER — METOPROLOL TARTRATE 100 MG PO TABS: 100.0000 mg | ORAL_TABLET | Freq: Two times a day (BID) | ORAL | 3 refills | Status: AC

## 2022-05-15 NOTE — Progress Notes (Signed)
Mobility Specialist: Progress Note   05/15/22 1756  Mobility  Activity Ambulated with assistance in hallway  Level of Assistance Minimal assist, patient does 75% or more  Assistive Device Front wheel walker  Distance Ambulated (ft) 210 ft  Activity Response Tolerated well  $Mobility charge 1 Mobility   Pre-Mobility: 64 HR Post-Mobility: 68 HR, 106/62 (73) BP  Pt received in the bed and agreeable to mobility. C/o mild RLE pain, otherwise asymptomatic. Pt back to bed per request with call bell and phone at his side.   [facility] [place] Mobility Specialist Mobility Specialist [address]: [phone]

## 2022-05-15 NOTE — Care Management Important Message (Signed)
Important Message  Patient Details  Name: Luis Deleon MRN: [id number] Date of Birth: [birth year]   Medicare Important Message Given:  Yes     [name] 05/15/2022, 9:53 AM

## 2022-05-15 NOTE — Progress Notes (Addendum)
      [address]       [place] [address]             [phone]      8 Days Post-Op Procedure(s) (LRB): CORONARY ARTERY BYPASS GRAFTING (CABG) X 3 , USING LEFT INTERNAL MAMMARY ARTERY AND RIGHT LEG GREATER SAPHENOUS VEIN HARVESTED ENDOSCOPICALLY (N/A) TRANSESOPHAGEAL ECHOCARDIOGRAM (TEE) (N/A)  Subjective:  Patient continues to feel well.  He has no complaints.  Has started to eat a little better.  + ambulation  Objective: Vital signs in last 24 hours: Temp:  [97.7 F (36.5 C)-98.7 F (37.1 C)] 98.3 F (36.8 C) (08/01 0352) Pulse Rate:  [55-82] 60 (08/01 0352) Cardiac Rhythm: Atrial fibrillation;Normal sinus rhythm;Other (Comment) (07/31 2007) Resp:  [16-20] 19 (08/01 0352) BP: (104-123)/(57-71) 114/61 (08/01 0352) SpO2:  [90 %-96 %] 90 % (08/01 0352)  Intake/Output from previous day: 07/31 0701 - 08/01 0700 In: -  Out: 800 [Urine:800]  General appearance: alert, cooperative, and no distress Heart: regular rate and rhythm Lungs: clear to auscultation bilaterally Abdomen: soft, non-tender; bowel sounds normal; no masses,  no organomegaly Extremities: edema trace Wound: clean and dry  Lab Results: Recent Labs    05/14/22 0403 05/15/22 0328  WBC 11.0* 12.2*  HGB 13.9 14.8  HCT 41.1 43.7  PLT 234 234   BMET:  Recent Labs    05/13/22 0327  NA 138  K 3.9  CL 102  CO2 27  GLUCOSE 93  BUN 18  CREATININE 1.11  CALCIUM 8.7*    PT/INR: No results for input(s): "LABPROT", "INR" in the last 72 hours. ABG    Component Value Date/Time   PHART 7.358 05/08/2022 0308   HCO3 25.2 05/08/2022 0308   TCO2 26 05/08/2022 0308   ACIDBASEDEF 6.0 (H) 05/07/2022 2302   O2SAT 97 05/08/2022 0308   CBG (last 3)  No results for input(s): "GLUCAP" in the last 72 hours.  Assessment/Plan: S/P Procedure(s) (LRB): CORONARY ARTERY BYPASS GRAFTING (CABG) X 3 , USING LEFT INTERNAL MAMMARY ARTERY AND RIGHT LEG GREATER SAPHENOUS VEIN HARVESTED ENDOSCOPICALLY  (N/A) TRANSESOPHAGEAL ECHOCARDIOGRAM (TEE) (N/A)  CV- Brief rate controlled PAF yesterday evening otherwise, maintaining NSR- continue Lopressor 100 mg BID, Amiodarone 400 mg BID, HCTZ, Eliquis... follow up with A. Fib clinic has been arranged Pulm- off oxygen, no acute issues Renal- weight is below baseline, will transition Lasix to prn ID- mild leukocytosis, afebrile, suspect due to infiltrated IV site on RUE, consistent with phlebitis, continue Keflex GI- hasn't moved bowels, benign abdominal exam, will give Lactulose today Dispo- patient stable, his rhythm was rate controlled and he is in NSR today, continue Keflex for phlebitis, Lactulose today, patient's rhythm has been stable, will plan to d/c later today if okay with Dr. Roxan Hockey   LOS: 8 days   Ellwood Handler, PA-C 05/15/2022  Patient seen and examined, agree with above Home later today if able to have BM  Angelice Piech C. Roxan Hockey, MD [facility] [phone]

## 2022-05-15 NOTE — Progress Notes (Signed)
CARDIAC REHAB PHASE I   PRE:  Rate/Rhythm: 67  BP:  Sitting: 105/68      SaO2: 98 RA  MODE:  Ambulation: 200 ft   POST:  Rate/Rhythm: 77  BP:  Sitting: 131/62    SaO2: 97 RA   Pt agreeable to ambulation. Pt ambulated 236f in hallway assist of one with gait belt and front wheel walker. Pt states minor dizziness that stayed consistent throughout session, denies pain or SOB. Pt returned to recliner. Encouraged continued ambulation and IS use. Will continue to follow.  [id number] Falco RN BSN 05/15/2022 9:42 AM

## 2022-05-15 NOTE — Progress Notes (Addendum)
Progress Note  Patient Name: Luis Deleon Date of Encounter: 05/15/2022  [facility] Cardiologist: Quay Burow, MD   Subjective   Pt feels well, wants to discharge home  Inpatient Medications    Scheduled Meds:  allopurinol  100 mg Oral BID   amiodarone  400 mg Oral BID   apixaban  5 mg Oral BID   aspirin EC  81 mg Oral Daily   atorvastatin  40 mg Oral Daily   bisacodyl  10 mg Oral Daily   Or   bisacodyl  10 mg Rectal Daily   cephALEXin  500 mg Oral Q8H   furosemide  40 mg Oral Daily   gemfibrozil  600 mg Oral Once per day on Mon Wed Fri   guaiFENesin  600 mg Oral BID   hydrochlorothiazide  12.5 mg Oral Daily   lactulose  20 g Oral Once   metoprolol tartrate  100 mg Oral BID   pantoprazole  40 mg Oral Daily   potassium chloride  40 mEq Oral Daily   sodium chloride flush  3 mL Intravenous Q12H   Continuous Infusions:  sodium chloride     PRN Meds: sodium chloride, alum & mag hydroxide-simeth, baclofen, levalbuterol, magnesium hydroxide, metoprolol tartrate, nystatin-triamcinolone, ondansetron (ZOFRAN) IV, oxyCODONE, sodium chloride flush, traMADol   Vital Signs    Vitals:   05/14/22 2049 05/14/22 2328 05/15/22 0352 05/15/22 0801  BP:  107/62 114/61 126/60  Pulse:  (!) 55 60 62  Resp: 17 16 19 20   Temp:  97.8 F (36.6 C) 98.3 F (36.8 C) 98.2 F (36.8 C)  TempSrc:  Oral Axillary Oral  SpO2:  92% 90% 92%  Weight:      Height:        Intake/Output Summary (Last 24 hours) at 05/15/2022 0844 Last data filed at 05/15/2022 0800 Gross per 24 hour  Intake --  Output 1150 ml  Net -1150 ml      05/14/2022    5:00 AM 05/13/2022    3:21 AM 05/12/2022    4:00 AM  Last 3 Weights  Weight (lbs) 228 lb 9.9 oz 228 lb 6.3 oz 223 lb 12.3 oz  Weight (kg) 103.7 kg 103.6 kg 101.5 kg      Telemetry    Sinus rhythm with bursts of wide complex tachycardia - Personally Reviewed  ECG    Repeat pending  - Personally Reviewed  Physical Exam   GEN: No acute  distress.   Neck: No JVD Cardiac: RRR, no murmurs, rubs, or gallops.  Respiratory: Clear to auscultation bilaterally. GI: Soft, nontender, non-distended  MS: No edema; No deformity. Neuro:  Nonfocal  Psych: Normal affect  Sternotomy C/D/I  Labs    High Sensitivity Troponin:  No results for input(s): "TROPONINIHS" in the last 720 hours.   Chemistry Recent Labs  Lab 05/08/22 1636 05/09/22 0302 05/10/22 0601 05/11/22 0119 05/13/22 0327  NA 140   < > 139 136 138  K 3.7   < > 4.0 3.5 3.9  CL 106   < > 106 106 102  CO2 25   < > 27 23 27   GLUCOSE 132*   < > 107* 93 93  BUN 15   < > 15 15 18   CREATININE 1.20   < > 1.26* 1.08 1.11  CALCIUM 7.9*   < > 8.2* 7.7* 8.7*  MG 2.3  --  2.1  --   --   GFRNONAA >60   < > 58* >60 >60  ANIONGAP 9   < > 6 7 9    < > = values in this interval not displayed.    Lipids No results for input(s): "CHOL", "TRIG", "HDL", "LABVLDL", "LDLCALC", "CHOLHDL" in the last 168 hours.  Hematology Recent Labs  Lab 05/10/22 0120 05/14/22 0403 05/15/22 0328  WBC 9.5 11.0* 12.2*  RBC 3.45* 4.33 4.69  HGB 11.2* 13.9 14.8  HCT 32.7* 41.1 43.7  MCV 94.8 94.9 93.2  MCH 32.5 32.1 31.6  MCHC 34.3 33.8 33.9  RDW 15.1 15.1 15.0  PLT 82* 234 234   Thyroid  Recent Labs  Lab 05/09/22 0302  TSH 1.957    BNPNo results for input(s): "BNP", "PROBNP" in the last 168 hours.  DDimer No results for input(s): "DDIMER" in the last 168 hours.   Radiology    No results found.  Cardiac Studies   Echo 05/10/22:  1. Left ventricular ejection fraction, by estimation, is 55 to 60%. The  left ventricle has normal function. The left ventricle has no regional  wall motion abnormalities. There is mild left ventricular hypertrophy.  Left ventricular diastolic parameters  are consistent with Grade II diastolic dysfunction (pseudonormalization).   2. Mildly D-shaped interventricular septum suggestive of RV  pressure/volume overload. Right ventricular systolic function is  mildly  reduced. The right ventricular size is mildly enlarged.   3. The mitral valve is normal in structure. Trivial mitral valve  regurgitation. No evidence of mitral stenosis.   4. The aortic valve is tricuspid. There is mild calcification of the  aortic valve. Aortic valve regurgitation is not visualized. No aortic  stenosis is present.   5. Peak RV-RA gradient 21 mmHg, IVC not visualized.   Patient Profile     81 y.o. male  with CAD, CKD, obesity who was admitted on 05/07/2022 for CABG.  Cardiology was consulted on 05/10/2022 for wide-complex tachycardia felt to be Afib with rate related LBBB. Pt converted on IV amiodarone. Anticoagulation started.   Assessment & Plan    Atrial fibrillation with RVR LBBB - rate related LBBB appeared wide complex tachcyardia on initial assessment - amiodarone gtt initiated  - now in sinus rhythm and on PO amiodarone - telemetry overnight concerning for NSVT, pt asymptomatic   Chronic anticoagulation Doing well on eliquis   CAD s/p CABG Continue statin and BB Continue ASA, no bleeding Per TCTS     Follow up has been arranged.   For questions or updates, please contact [place] Please consult [email or web address] for contact info under        Signed, Ledora Bottcher, PA  05/15/2022, 8:44 AM    History and all data above reviewed.  Patient examined.  I agree with the findings as above.  He has felt OK.  No acute pain or SOB. The patient exam reveals COR:RRR,  ,  Lungs: Clear  ,  Abd: Positive bowel sounds, no rebound no guarding, Ext No edema  .  All available labs, radiology testing, previous records reviewed. Agree with documented assessment and plan. Atrial fib:  No VT yesterday.  This was PAF.  OK to discharge with plans as written and meds as on MAR.  Home per primary team after he has a BM.    Jeneen Rinks Lillyrose Reitan  11:24 AM  05/15/2022

## 2022-05-16 DIAGNOSIS — I480 Paroxysmal atrial fibrillation: Secondary | ICD-10-CM | POA: Diagnosis not present

## 2022-05-16 DIAGNOSIS — Z951 Presence of aortocoronary bypass graft: Secondary | ICD-10-CM | POA: Diagnosis not present

## 2022-05-16 LAB — CBC
HCT: 40.1 % (ref 39.0–52.0)
Hemoglobin: 13.8 g/dL (ref 13.0–17.0)
MCH: 32.3 pg (ref 26.0–34.0)
MCHC: 34.4 g/dL (ref 30.0–36.0)
MCV: 93.9 fL (ref 80.0–100.0)
Platelets: 299 10*3/uL (ref 150–400)
RBC: 4.27 MIL/uL (ref 4.22–5.81)
RDW: 15.3 % (ref 11.5–15.5)
WBC: 14.8 10*3/uL — ABNORMAL HIGH (ref 4.0–10.5)
nRBC: 0 % (ref 0.0–0.2)

## 2022-05-16 MED ORDER — AMOXICILLIN-POT CLAVULANATE 500-125 MG PO TABS: 1.0000 | ORAL_TABLET | Freq: Three times a day (TID) | ORAL | Status: DC

## 2022-05-16 MED ORDER — AMIODARONE HCL 200 MG PO TABS
200.0000 mg | ORAL_TABLET | Freq: Every day | ORAL | Status: DC
  Administered 2022-05-16 – 2022-05-17 (×2): 200 mg via ORAL
  Filled 2022-05-16 (×2): qty 1

## 2022-05-16 MED ORDER — METOPROLOL TARTRATE 50 MG PO TABS
50.0000 mg | ORAL_TABLET | Freq: Two times a day (BID) | ORAL | Status: DC
  Administered 2022-05-16 (×2): 50 mg via ORAL
  Filled 2022-05-16 (×2): qty 1

## 2022-05-16 MED ORDER — LACTULOSE 10 GM/15ML PO SOLN
30.0000 g | Freq: Once | ORAL | Status: AC
  Administered 2022-05-16: 30 g via ORAL
  Filled 2022-05-16: qty 45

## 2022-05-16 MED ORDER — AMOXICILLIN-POT CLAVULANATE 500-125 MG PO TABS
1.0000 | ORAL_TABLET | Freq: Three times a day (TID) | ORAL | Status: DC
  Administered 2022-05-16 – 2022-05-17 (×3): 500 mg via ORAL
  Filled 2022-05-16 (×4): qty 1

## 2022-05-16 MED ORDER — POTASSIUM CHLORIDE CRYS ER 10 MEQ PO TBCR
10.0000 meq | EXTENDED_RELEASE_TABLET | Freq: Every day | ORAL | Status: DC
  Administered 2022-05-16: 10 meq via ORAL
  Filled 2022-05-16: qty 1

## 2022-05-16 MED ORDER — LEVOFLOXACIN 750 MG PO TABS: 750.0000 mg | ORAL_TABLET | Freq: Every day | ORAL | Status: DC

## 2022-05-16 NOTE — Progress Notes (Addendum)
Progress Note  Patient Name: Luis Deleon Date of Encounter: 05/16/2022  [facility] Cardiologist: Quay Burow, MD   Subjective   Patient states he is feeling ok, reports mild dizziness when standing up and walking. He has not moved BM yet. He denied any chest pain. He states his right leg is swollen and bruise. He is on [facility]. He states he felt SOB with ambulation since yesterday.   Inpatient Medications    Scheduled Meds:  allopurinol  100 mg Oral BID   amiodarone  200 mg Oral Daily   apixaban  5 mg Oral BID   aspirin EC  81 mg Oral Daily   atorvastatin  40 mg Oral Daily   bisacodyl  10 mg Oral Daily   Or   bisacodyl  10 mg Rectal Daily   cephALEXin  500 mg Oral Q8H   furosemide  40 mg Oral Daily   gemfibrozil  600 mg Oral Once per day on Mon Wed Fri   guaiFENesin  600 mg Oral BID   hydrochlorothiazide  12.5 mg Oral Daily   metoprolol tartrate  50 mg Oral BID   pantoprazole  40 mg Oral Daily   potassium chloride  10 mEq Oral Daily   sodium chloride flush  3 mL Intravenous Q12H   Continuous Infusions:  sodium chloride     PRN Meds: sodium chloride, alum & mag hydroxide-simeth, baclofen, levalbuterol, magnesium hydroxide, metoprolol tartrate, nystatin-triamcinolone, ondansetron (ZOFRAN) IV, oxyCODONE, sodium chloride flush, traMADol   Vital Signs    Vitals:   05/15/22 2009 05/16/22 0004 05/16/22 0627 05/16/22 0815  BP: 111/64 109/61 125/69 (!) 99/54  Pulse: 61 (!) 52 84 60  Resp: 20 18 18 20   Temp: 98.9 F (37.2 C) 98.5 F (36.9 C) (!) 97.5 F (36.4 C) 98.2 F (36.8 C)  TempSrc: Axillary Oral Axillary Oral  SpO2: 95% 95% 95% 94%  Weight:      Height:        Intake/Output Summary (Last 24 hours) at 05/16/2022 0900 Last data filed at 05/15/2022 1925 Gross per 24 hour  Intake 120 ml  Output 800 ml  Net -680 ml      05/14/2022    5:00 AM 05/13/2022    3:21 AM 05/12/2022    4:00 AM  Last 3 Weights  Weight (lbs) 228 lb 9.9 oz 228 lb 6.3 oz 223 lb 12.3  oz  Weight (kg) 103.7 kg 103.6 kg 101.5 kg      Telemetry    Sinus rhythm 60s, noted overnight sinus bradycardia 50s - Personally Reviewed  ECG    No new tracing today - Personally Reviewed  Physical Exam   GEN: No acute distress.   Neck: No JVD Cardiac: RRR, no murmurs, rubs, or gallops.  Respiratory: Clear to auscultation bilaterally. Diminished at base. On 2LNC.  GI: Soft, nontender, non-distended  MS: Right lower leg and thigh with significant ecchymosis with mild edema  Neuro:  Nonfocal  Psych: Normal affect  Sternotomy C/D/I  Labs    High Sensitivity Troponin:  No results for input(s): "TROPONINIHS" in the last 720 hours.   Chemistry Recent Labs  Lab 05/10/22 0601 05/11/22 0119 05/13/22 0327  NA 139 136 138  K 4.0 3.5 3.9  CL 106 106 102  CO2 27 23 27   GLUCOSE 107* 93 93  BUN 15 15 18   CREATININE 1.26* 1.08 1.11  CALCIUM 8.2* 7.7* 8.7*  MG 2.1  --   --   GFRNONAA 58* >60 >  60  ANIONGAP 6 7 9     Lipids No results for input(s): "CHOL", "TRIG", "HDL", "LABVLDL", "LDLCALC", "CHOLHDL" in the last 168 hours.  Hematology Recent Labs  Lab 05/14/22 0403 05/15/22 0328 05/16/22 0130  WBC 11.0* 12.2* 14.8*  RBC 4.33 4.69 4.27  HGB 13.9 14.8 13.8  HCT 41.1 43.7 40.1  MCV 94.9 93.2 93.9  MCH 32.1 31.6 32.3  MCHC 33.8 33.9 34.4  RDW 15.1 15.0 15.3  PLT 234 234 299   Thyroid  No results for input(s): "TSH", "FREET4" in the last 168 hours.   BNPNo results for input(s): "BNP", "PROBNP" in the last 168 hours.  DDimer No results for input(s): "DDIMER" in the last 168 hours.   Radiology    No results found.  Cardiac Studies   Echo 05/10/22:  1. Left ventricular ejection fraction, by estimation, is 55 to 60%. The  left ventricle has normal function. The left ventricle has no regional  wall motion abnormalities. There is mild left ventricular hypertrophy.  Left ventricular diastolic parameters  are consistent with Grade II diastolic dysfunction  (pseudonormalization).   2. Mildly D-shaped interventricular septum suggestive of RV  pressure/volume overload. Right ventricular systolic function is mildly  reduced. The right ventricular size is mildly enlarged.   3. The mitral valve is normal in structure. Trivial mitral valve  regurgitation. No evidence of mitral stenosis.   4. The aortic valve is tricuspid. There is mild calcification of the  aortic valve. Aortic valve regurgitation is not visualized. No aortic  stenosis is present.   5. Peak RV-RA gradient 21 mmHg, IVC not visualized.   Patient Profile     81 y.o. male  with HTN, HLD, CAD, PSVT, obesity, Crohn's disease, multiple abdominal surgeries, prostate cancer with radical prostatectomy, who was admitted on 05/07/2022 for CABG.  Cardiology was consulted on 05/10/2022 for wide-complex tachycardia felt to be Afib with rate related LBBB. Pt converted on IV amiodarone + metoprolol. Anticoagulation started with eliquis.   Assessment & Plan    Paroxysmal Atrial fibrillation with RVR (post op) LBBB Sinus bradycardia  - rate related LBBB appeared wide complex tachcyardia on initial assessment - remains in SR with IV amiodarone and PO metoprolol  - noted sinus bradycardia overnight 50s - completed amiodarone loading, will reduce amiodarone to 23m daily today and can be discharge on this dose - BP low normal, HR 50-60s, will reduce metoprolol to 578mBID to avoid worsening bradycardia/hypotension - agree with anticoagulation with Eliquis  -- follow up with cardiology on 06/08/22    CAD s/p CABG - Continue lipitor, metoprolol, ASA 81103mer CT surgery  - if significant bleeding noted, may need to drop ASA while continue Eliquis   Constipation Debility Gout GERD - per primary team    For questions or updates, please contact [facility] Please consult [email or web address] for contact info under        Signed, XikMargie BilletP  05/16/2022, 9:00 AM    History and all data above  reviewed.  Patient examined.  I agree with the findings as above.  Mild sporadic SOB.  No sustained arrhythmias.  Mild dizziness.  Still no BM. The patient exam reveals COR:RRR  ,  Lungs: Clear  ,  Abd: Positive bowel sounds, no rebound no guarding, Ext Mild diffuse edema  .  All available labs, radiology testing, previous records reviewed. Agree with documented assessment and plan.   Atrial fib:  Reduced dose of amio today.  No other change in therapy.  Call us if there are other questions prior to discharge.   Jeneen Rinks Joaquin Knebel  12:44 PM  05/16/2022

## 2022-05-16 NOTE — Progress Notes (Signed)
CARDIAC REHAB PHASE I    MODE:  Ambulation: 200 ft    After much encouragement from NT, pt agreeable to get out of bed. Pt ambulated 223f in hallway assist of one with front wheel walker. Pt denies CP, SOB, or dizziness throughout walk. Pt states biggest deterrent to ambulation is fatigue. Pt returned to chair in front of sink for a wash up. Will continue to follow.    [id number] Falco RN BSN 05/16/2022 2:11 PM

## 2022-05-16 NOTE — Progress Notes (Signed)
CARDIAC REHAB PHASE I   Came to offer to walk with pt earlier, pt declining, stating he wanted to finish his phone call. Returned later in the afternoon, and pt getting back into bed. Stressed importance of staying in recliner and continued ambulation. Pt insistent on laying down since he was "sitting for an hour". Pt reeducated on importance of ambulation, IS use, and sitting up in recliner all to help build strength. Pt states agreement, but still prefer to stay in bed. Call bell and bedside table within reach. Will continue to follow and ambulate as pt allows.  [place], RN BSN 05/16/2022 1:48 PM

## 2022-05-16 NOTE — Progress Notes (Signed)
Mobility Specialist: Progress Note   05/16/22 1239  Mobility  Activity Ambulated with assistance in hallway  Level of Assistance Minimal assist, patient does 75% or more  Assistive Device Front wheel walker  Distance Ambulated (ft) 210 ft  Activity Response Tolerated well  $Mobility charge 1 Mobility   Pre-Mobility: 62 HR, 95% SpO2 Post-Mobility: 70 HR, 117/71 (82) BP, 94% SpO2  Pt received in the bed and agreeable to mobility. MinA with bed mobility and CG to stand/ambulation. No c/o throughout session today. Pt to the recliner with call bell and phone at his side.   [facility] Lynden Flemmer Mobility Specialist Mobility Specialist [address]: [phone]

## 2022-05-16 NOTE — Progress Notes (Addendum)
      [address]       [place] [address]             [phone]      9 Days Post-Op Procedure(s) (LRB): CORONARY ARTERY BYPASS GRAFTING (CABG) X 3 , USING LEFT INTERNAL MAMMARY ARTERY AND RIGHT LEG GREATER SAPHENOUS VEIN HARVESTED ENDOSCOPICALLY (N/A) TRANSESOPHAGEAL ECHOCARDIOGRAM (TEE) (N/A)  Subjective:  Patient doing okay.  He has not yet moved his bowels.  States he is passing a lot of gas.  Denies abdominal pain, N/V  Objective: Vital signs in last 24 hours: Temp:  [97.5 F (36.4 C)-98.9 F (37.2 C)] 97.5 F (36.4 C) (08/02 0627) Pulse Rate:  [52-84] 84 (08/02 0627) Cardiac Rhythm: Normal sinus rhythm (08/01 2100) Resp:  [18-20] 18 (08/02 0627) BP: (106-128)/(48-69) 125/69 (08/02 0627) SpO2:  [92 %-96 %] 95 % (08/02 0627)  Intake/Output from previous day: 08/01 0701 - 08/02 0700 In: 240 [P.O.:240] Out: 1150 [Urine:1150]  General appearance: alert, cooperative, and no distress Heart: regular rate and rhythm Lungs: clear to auscultation bilaterally Abdomen: soft, non-tender; bowel sounds normal; no masses,  no organomegaly Extremities: edema trace, RUE with increase in erythema at IV infiltration site Wound: clean and dry  Lab Results: Recent Labs    05/15/22 0328 05/16/22 0130  WBC 12.2* 14.8*  HGB 14.8 13.8  HCT 43.7 40.1  PLT 234 299   BMET: No results for input(s): "NA", "K", "CL", "CO2", "GLUCOSE", "BUN", "CREATININE", "CALCIUM" in the last 72 hours.  PT/INR: No results for input(s): "LABPROT", "INR" in the last 72 hours. ABG    Component Value Date/Time   PHART 7.358 05/08/2022 0308   HCO3 25.2 05/08/2022 0308   TCO2 26 05/08/2022 0308   ACIDBASEDEF 6.0 (H) 05/07/2022 2302   O2SAT 97 05/08/2022 0308   CBG (last 3)  No results for input(s): "GLUCAP" in the last 72 hours.  Assessment/Plan: S/P Procedure(s) (LRB): CORONARY ARTERY BYPASS GRAFTING (CABG) X 3 , USING LEFT INTERNAL MAMMARY ARTERY AND RIGHT LEG GREATER SAPHENOUS  VEIN HARVESTED ENDOSCOPICALLY (N/A) TRANSESOPHAGEAL ECHOCARDIOGRAM (TEE) (N/A)  CV- continues to maintain Sinus Bradycardia- continue Lopressor, Amiodarone, Eliquis, HCTZ Pulm- no acute issues, continue use of IS Renal- creatinine, lytes have been stable, weight is below on low dose lasix, potassium ID- afebrile, mild leukocytosis at 14K, erythema on RUE appears to be worse, he has been on Keflex since Monday, will discuss antibiotic regimen with Dr. Roxan Hockey GI- constipation, abdominal exam remains benign, he is passing gas, continue supportive measures Dispo- patient continues to maintain NSR, will discuss ABX regimen with Dr. Roxan Hockey for RUE phlebitis due to infiltrated IV site, patient will be ready for d/c once bowels move   LOS: 9 days    Ellwood Handler, PA-C 05/16/2022  Patient seen and examined, agree with above Right arm is likely source of WBC Will add warm compresses Change antibiotic to Augmentin. Will avoid Levaquin due to potential for interaction with amiodarone  Remo Lipps C. Roxan Hockey, MD [facility] [phone]

## 2022-05-17 DIAGNOSIS — I480 Paroxysmal atrial fibrillation: Secondary | ICD-10-CM | POA: Diagnosis not present

## 2022-05-17 DIAGNOSIS — Z951 Presence of aortocoronary bypass graft: Secondary | ICD-10-CM | POA: Diagnosis not present

## 2022-05-17 LAB — CBC
HCT: 40.9 % (ref 39.0–52.0)
Hemoglobin: 14 g/dL (ref 13.0–17.0)
MCH: 32 pg (ref 26.0–34.0)
MCHC: 34.2 g/dL (ref 30.0–36.0)
MCV: 93.6 fL (ref 80.0–100.0)
Platelets: 309 10*3/uL (ref 150–400)
RBC: 4.37 MIL/uL (ref 4.22–5.81)
RDW: 15.3 % (ref 11.5–15.5)
WBC: 16.6 10*3/uL — ABNORMAL HIGH (ref 4.0–10.5)
nRBC: 0 % (ref 0.0–0.2)

## 2022-05-17 MED ORDER — METOPROLOL TARTRATE 100 MG PO TABS
100.0000 mg | ORAL_TABLET | Freq: Two times a day (BID) | ORAL | Status: DC
  Administered 2022-05-17: 100 mg via ORAL
  Filled 2022-05-17: qty 1

## 2022-05-17 MED ORDER — AMIODARONE HCL 200 MG PO TABS: 200.0000 mg | ORAL_TABLET | Freq: Every day | ORAL | 2 refills | Status: AC

## 2022-05-17 MED ORDER — AMOXICILLIN-POT CLAVULANATE 500-125 MG PO TABS: 1.0000 | ORAL_TABLET | Freq: Three times a day (TID) | ORAL | 0 refills | Status: AC

## 2022-05-17 NOTE — Progress Notes (Signed)
05/17/2022 2:47 PM Discharge AVS meds taken today and those due this evening reviewed.  Follow-up appointments and when to call md reviewed.  D/C IV and TELE.  Questions and concerns addressed.   D/C home per orders.  [place]

## 2022-05-17 NOTE — Progress Notes (Addendum)
Progress Note  Patient Name: Luis Deleon Date of Encounter: 05/17/2022  [facility] Cardiologist: Quay Burow, MD   Subjective   Patient is off oxygen, states he had to have bowel movements twice last night. He was not aware of his A fib last night. He denied any chest pain, or SOB. He states he is feeling little dizzy when walking.   Inpatient Medications    Scheduled Meds:  allopurinol  100 mg Oral BID   amiodarone  200 mg Oral Daily   amoxicillin-clavulanate  1 tablet Oral Q8H   apixaban  5 mg Oral BID   aspirin EC  81 mg Oral Daily   atorvastatin  40 mg Oral Daily   bisacodyl  10 mg Oral Daily   Or   bisacodyl  10 mg Rectal Daily   gemfibrozil  600 mg Oral Once per day on Mon Wed Fri   metoprolol tartrate  100 mg Oral BID   pantoprazole  40 mg Oral Daily   sodium chloride flush  3 mL Intravenous Q12H   Continuous Infusions:  sodium chloride     PRN Meds: sodium chloride, alum & mag hydroxide-simeth, baclofen, levalbuterol, magnesium hydroxide, metoprolol tartrate, nystatin-triamcinolone, ondansetron (ZOFRAN) IV, oxyCODONE, sodium chloride flush, traMADol   Vital Signs    Vitals:   05/16/22 2120 05/16/22 2300 05/17/22 0345 05/17/22 0539  BP: 114/67 107/60 93/69   Pulse: 68 63 92   Resp:  (!) 21 (!) 21   Temp:  98.4 F (36.9 C) 97.8 F (36.6 C)   TempSrc:  Oral Oral   SpO2:   90%   Weight:    98.5 kg  Height:       No intake or output data in the 24 hours ending 05/17/22 0844     05/17/2022    5:39 AM 05/14/2022    5:00 AM 05/13/2022    3:21 AM  Last 3 Weights  Weight (lbs) 217 lb 3.2 oz 228 lb 9.9 oz 228 lb 6.3 oz  Weight (kg) 98.521 kg 103.7 kg 103.6 kg      Telemetry    Sinus rhythm 70s, back in A fib 90-100s on 05/17/22 at 1:23 AM, converted to SR on 05/17/22 at 7:39AM - Personally Reviewed  ECG    No new tracing today - Personally Reviewed  Physical Exam   GEN: No acute distress.   Neck: No JVD Cardiac: RRR, no murmurs, rubs, or  gallops.  Respiratory: Clear to auscultation bilaterally. Diminished at base. Room air.  GI: Soft, nontender, non-distended  MS: Right lower leg and thigh with significant ecchymosis with mild edema  Neuro:  Nonfocal  Psych: Normal affect  Sternotomy C/D/I  Labs    High Sensitivity Troponin:  No results for input(s): "TROPONINIHS" in the last 720 hours.   Chemistry Recent Labs  Lab 05/11/22 0119 05/13/22 0327  NA 136 138  K 3.5 3.9  CL 106 102  CO2 23 27  GLUCOSE 93 93  BUN 15 18  CREATININE 1.08 1.11  CALCIUM 7.7* 8.7*  GFRNONAA >60 >60  ANIONGAP 7 9    Lipids No results for input(s): "CHOL", "TRIG", "HDL", "LABVLDL", "LDLCALC", "CHOLHDL" in the last 168 hours.  Hematology Recent Labs  Lab 05/15/22 0328 05/16/22 0130 05/17/22 0229  WBC 12.2* 14.8* 16.6*  RBC 4.69 4.27 4.37  HGB 14.8 13.8 14.0  HCT 43.7 40.1 40.9  MCV 93.2 93.9 93.6  MCH 31.6 32.3 32.0  MCHC 33.9 34.4 34.2  RDW 15.0  15.3 15.3  PLT 234 299 309   Thyroid  No results for input(s): "TSH", "FREET4" in the last 168 hours.   BNPNo results for input(s): "BNP", "PROBNP" in the last 168 hours.  DDimer No results for input(s): "DDIMER" in the last 168 hours.   Radiology    No results found.  Cardiac Studies   Echo 05/10/22:  1. Left ventricular ejection fraction, by estimation, is 55 to 60%. The  left ventricle has normal function. The left ventricle has no regional  wall motion abnormalities. There is mild left ventricular hypertrophy.  Left ventricular diastolic parameters  are consistent with Grade II diastolic dysfunction (pseudonormalization).   2. Mildly D-shaped interventricular septum suggestive of RV  pressure/volume overload. Right ventricular systolic function is mildly  reduced. The right ventricular size is mildly enlarged.   3. The mitral valve is normal in structure. Trivial mitral valve  regurgitation. No evidence of mitral stenosis.   4. The aortic valve is tricuspid. There is  mild calcification of the  aortic valve. Aortic valve regurgitation is not visualized. No aortic  stenosis is present.   5. Peak RV-RA gradient 21 mmHg, IVC not visualized.   Patient Profile     81 y.o. male  with HTN, HLD, CAD, PSVT, obesity, Crohn's disease, multiple abdominal surgeries, prostate cancer with radical prostatectomy, who was admitted on 05/07/2022 for CABG.  Cardiology was consulted on 05/10/2022 for wide-complex tachycardia felt to be Afib with rate related LBBB. Pt converted on IV amiodarone + metoprolol. Anticoagulation started with eliquis.   Assessment & Plan    Paroxysmal Atrial fibrillation with RVR (post op) LBBB Sinus bradycardia  - rate related LBBB appeared wide complex tachcyardia on initial assessment - completed amiodarone loading, reduced to amiodarone to 215m daily  - reduced metoprolol to 557mBID due to bradycardia 50s /low BP, now increased back to 100108mID per CT surgery due to rate controlled A fib (noted back in A fib 6 hours last night), if HR and BP tolerating, ok to continue current dose, call us [place] further question  - agree with anticoagulation with Eliquis  -- follow up with cardiology on 06/08/22  CAD s/p CABG - Continue lipitor, metoprolol, ASA 35m8mr CT surgery  - if significant bleeding noted, may need to drop ASA while continue Eliquis   Leukocytosis  Debility Gout GERD - per primary team   For questions or updates, please contact [facility] Please consult [email or web address] for contact info under      Signed, XikaMargie Billet  05/17/2022, 8:44 AM     History and all data above reviewed.  Patient examined.  I agree with the findings as above.  He had 3 BMs yesterday.  No pain. Breathing is better.  He was in atrial fib with rate below 100 for several hours early this morning.  He did not notice this.  The patient exam reveals COR:RRR  ,  Lungs: Clear  ,  Abd: Positive bowel sounds, no rebound no guarding, Ext No edema   .  All  available labs, radiology testing, previous records reviewed. Agree with documented assessment and plan. PAF:  H had a long discusion with him.  He does not feel the fib.  Rate is OK and he is on amio.  Tolerating anticoagulation.  We will follow as an out patient to determine the long term burden of fib and need for continued anticoag and amio.   JameMinus Breeding:12 AM  05/17/2022'

## 2022-05-17 NOTE — Progress Notes (Signed)
Seen pt from [phone], reinforced sternal precautions, continued IS use, and restrictions for d/c.   Luis Deleon 2:33 PM 05/17/2022

## 2022-05-17 NOTE — TOC Transition Note (Signed)
Transition of Care (TOC) - CM/SW Discharge Note Marvetta Gibbons RN, BSN Transitions of Care Unit 4E- RN Case Manager See Treatment Team for direct phone #    Patient Details  Name: GORGE ALMANZA MRN: [id number] Date of Birth: 1941-03-09  Transition of Care [facility]) CM/SW Contact:  Dawayne Patricia, RN Phone Number: 05/17/2022, 2:26 PM   Clinical Narrative:    Pt stable for transition home today, TCTS office gave office referral for [facility] protocol to [facility]. Latricia Heft has been following for discharge. CM has notified Lattie Haw w/ [facility] of d/c today for [facility] start of care.   No other TOC needs noted, family to transport home.    Final next level of care: Home w Home Health Services Barriers to Discharge: Barriers Resolved   Patient Goals and CMS Choice     Choice offered to / list presented to : NA  Discharge Placement                 Home w/ [facility]      Discharge Plan and Services In-house Referral: NA Discharge Planning Services: CM Consult Post Acute Care Choice: Home Health          DME Arranged: N/A DME Agency: NA       HH Arranged: PT HH Agency: [facility] Date [place]: 05/17/22 Time [place]: [phone] Representative spoke with at [place]: [facility]  Social Determinants of Health ([place]) Interventions     Readmission Risk Interventions    05/17/2022    2:19 PM  Readmission Risk Prevention Plan  Post Dischage Appt Complete  Medication Screening Complete  Transportation Screening Complete

## 2022-05-17 NOTE — Progress Notes (Addendum)
      [address]       [place] [address]             [phone]      10 Days Post-Op Procedure(s) (LRB): CORONARY ARTERY BYPASS GRAFTING (CABG) X 3 , USING LEFT INTERNAL MAMMARY ARTERY AND RIGHT LEG GREATER SAPHENOUS VEIN HARVESTED ENDOSCOPICALLY (N/A) TRANSESOPHAGEAL ECHOCARDIOGRAM (TEE) (N/A)  Subjective:  Patient has no new complaints.  He was able to move his bowels yesterday.   Objective: Vital signs in last 24 hours: Temp:  [97.8 F (36.6 C)-98.4 F (36.9 C)] 97.8 F (36.6 C) (08/03 0345) Pulse Rate:  [60-92] 92 (08/03 0345) Cardiac Rhythm: Normal sinus rhythm;Atrial fibrillation;Bundle branch block;Other (Comment) (08/03 0149) Resp:  [18-21] 21 (08/03 0345) BP: (91-140)/(41-69) 93/69 (08/03 0345) SpO2:  [90 %-99 %] 90 % (08/03 0345) Weight:  [98.5 kg] 98.5 kg (08/03 0539)  General appearance: alert, cooperative, and no distress Heart: irregularly irregular rhythm Lungs: clear to auscultation bilaterally Abdomen: soft, non-tender; bowel sounds normal; no masses,  no organomegaly Extremities: RUE phlebitis stable Wound: clean and dry  Lab Results: Recent Labs    05/16/22 0130 05/17/22 0229  WBC 14.8* 16.6*  HGB 13.8 14.0  HCT 40.1 40.9  PLT 299 309   BMET: No results for input(s): "NA", "K", "CL", "CO2", "GLUCOSE", "BUN", "CREATININE", "CALCIUM" in the last 72 hours.  PT/INR: No results for input(s): "LABPROT", "INR" in the last 72 hours. ABG    Component Value Date/Time   PHART 7.358 05/08/2022 0308   HCO3 25.2 05/08/2022 0308   TCO2 26 05/08/2022 0308   ACIDBASEDEF 6.0 (H) 05/07/2022 2302   O2SAT 97 05/08/2022 0308   CBG (last 3)  No results for input(s): "GLUCAP" in the last 72 hours.  Assessment/Plan: S/P Procedure(s) (LRB): CORONARY ARTERY BYPASS GRAFTING (CABG) X 3 , USING LEFT INTERNAL MAMMARY ARTERY AND RIGHT LEG GREATER SAPHENOUS VEIN HARVESTED ENDOSCOPICALLY (N/A) TRANSESOPHAGEAL ECHOCARDIOGRAM (TEE) (N/A)  CV- back  in Atrial Fibrillation this morning- rate is okay, Cardiology recommended reduction in Amiodarone, however Lopressor was also decreased... will titrate back to 100 mg BID, stop HCTZ due to hypotension and continue Eliquis Pulm- off oxygen, no acute issues Renal- weight is below baseline, remains stable, will stop Lasix, potassium ID-RUE Phlebitis, appears stable, no increase in erythema, was transitioned to Augmentin yesterday, leukocytosis of 16K monitor GI- constipation resolved Dispo- patient back in Atrial Fibrillation rate is okay, RUE phlebitis appears stable transitioned to Augmentin yesterday, continue current care for possible d/c later today vs. tomorrow   LOS: 10 days   Ellwood Handler, PA-C 05/17/2022  Patient seen and examined, agree with A/P above Arm wound about the same Plan 1 week of Augmentin Home later today  Remo Lipps C. Roxan Hockey, MD [facility] [phone]

## 2022-05-17 NOTE — Progress Notes (Signed)
CARDIAC REHAB PHASE I   PRE:  Rate/Rhythm: 76 NSR  BP:  Sitting: 104/68      SaO2: 96 RA  MODE:  Ambulation: 280 ft   POST:  Rate/Rhythm: 93 NSR  BP:  Sitting: 102/57      SaO2: 96 RA   Pt was ambulated through hallway with minimal assistance using RW and gait belt. Pt complained of some dizziness and leg pain from incision. Pt was returned to room and helped to bathroom w/o complaint. Pt is now in bed with all needs met. Encouraged IS use.   [id number]  Luis Deleon  11:58 AM 05/17/2022

## 2022-05-18 ENCOUNTER — Emergency Department (HOSPITAL_COMMUNITY): Payer: Medicare HMO

## 2022-05-18 ENCOUNTER — Other Ambulatory Visit: Payer: Self-pay | Admitting: *Deleted

## 2022-05-18 ENCOUNTER — Other Ambulatory Visit: Payer: Self-pay

## 2022-05-18 ENCOUNTER — Telehealth: Payer: Self-pay

## 2022-05-18 ENCOUNTER — Inpatient Hospital Stay (HOSPITAL_COMMUNITY)
Admission: EM | Admit: 2022-05-18 | Discharge: 2022-06-15 | DRG: 871 | Disposition: E | Payer: Medicare HMO | Attending: Thoracic Surgery (Cardiothoracic Vascular Surgery) | Admitting: Thoracic Surgery (Cardiothoracic Vascular Surgery)

## 2022-05-18 ENCOUNTER — Encounter (HOSPITAL_COMMUNITY): Payer: Self-pay

## 2022-05-18 DIAGNOSIS — R7989 Other specified abnormal findings of blood chemistry: Secondary | ICD-10-CM | POA: Diagnosis present

## 2022-05-18 DIAGNOSIS — E43 Unspecified severe protein-calorie malnutrition: Secondary | ICD-10-CM | POA: Diagnosis present

## 2022-05-18 DIAGNOSIS — L03113 Cellulitis of right upper limb: Secondary | ICD-10-CM | POA: Diagnosis present

## 2022-05-18 DIAGNOSIS — I3139 Other pericardial effusion (noninflammatory): Secondary | ICD-10-CM | POA: Diagnosis present

## 2022-05-18 DIAGNOSIS — Z951 Presence of aortocoronary bypass graft: Secondary | ICD-10-CM | POA: Diagnosis not present

## 2022-05-18 DIAGNOSIS — M5430 Sciatica, unspecified side: Secondary | ICD-10-CM | POA: Diagnosis present

## 2022-05-18 DIAGNOSIS — J9811 Atelectasis: Secondary | ICD-10-CM | POA: Diagnosis present

## 2022-05-18 DIAGNOSIS — L7634 Postprocedural seroma of skin and subcutaneous tissue following other procedure: Secondary | ICD-10-CM | POA: Diagnosis not present

## 2022-05-18 DIAGNOSIS — I493 Ventricular premature depolarization: Secondary | ICD-10-CM | POA: Diagnosis present

## 2022-05-18 DIAGNOSIS — I517 Cardiomegaly: Secondary | ICD-10-CM | POA: Diagnosis not present

## 2022-05-18 DIAGNOSIS — Z8546 Personal history of malignant neoplasm of prostate: Secondary | ICD-10-CM

## 2022-05-18 DIAGNOSIS — K828 Other specified diseases of gallbladder: Secondary | ICD-10-CM | POA: Diagnosis present

## 2022-05-18 DIAGNOSIS — K5090 Crohn's disease, unspecified, without complications: Secondary | ICD-10-CM | POA: Diagnosis present

## 2022-05-18 DIAGNOSIS — I129 Hypertensive chronic kidney disease with stage 1 through stage 4 chronic kidney disease, or unspecified chronic kidney disease: Secondary | ICD-10-CM | POA: Diagnosis not present

## 2022-05-18 DIAGNOSIS — I959 Hypotension, unspecified: Secondary | ICD-10-CM | POA: Diagnosis present

## 2022-05-18 DIAGNOSIS — I10 Essential (primary) hypertension: Secondary | ICD-10-CM

## 2022-05-18 DIAGNOSIS — E872 Acidosis, unspecified: Secondary | ICD-10-CM | POA: Diagnosis not present

## 2022-05-18 DIAGNOSIS — A419 Sepsis, unspecified organism: Principal | ICD-10-CM | POA: Diagnosis present

## 2022-05-18 DIAGNOSIS — J90 Pleural effusion, not elsewhere classified: Secondary | ICD-10-CM | POA: Diagnosis not present

## 2022-05-18 DIAGNOSIS — Z87442 Personal history of urinary calculi: Secondary | ICD-10-CM

## 2022-05-18 DIAGNOSIS — E1165 Type 2 diabetes mellitus with hyperglycemia: Secondary | ICD-10-CM | POA: Diagnosis not present

## 2022-05-18 DIAGNOSIS — I469 Cardiac arrest, cause unspecified: Secondary | ICD-10-CM | POA: Diagnosis not present

## 2022-05-18 DIAGNOSIS — R58 Hemorrhage, not elsewhere classified: Secondary | ICD-10-CM | POA: Diagnosis not present

## 2022-05-18 DIAGNOSIS — E669 Obesity, unspecified: Secondary | ICD-10-CM | POA: Diagnosis not present

## 2022-05-18 DIAGNOSIS — R000 Tachycardia, unspecified: Secondary | ICD-10-CM | POA: Diagnosis not present

## 2022-05-18 DIAGNOSIS — R791 Abnormal coagulation profile: Secondary | ICD-10-CM | POA: Diagnosis not present

## 2022-05-18 DIAGNOSIS — Z452 Encounter for adjustment and management of vascular access device: Secondary | ICD-10-CM | POA: Diagnosis not present

## 2022-05-18 DIAGNOSIS — E782 Mixed hyperlipidemia: Secondary | ICD-10-CM | POA: Diagnosis present

## 2022-05-18 DIAGNOSIS — I482 Chronic atrial fibrillation, unspecified: Secondary | ICD-10-CM | POA: Diagnosis not present

## 2022-05-18 DIAGNOSIS — Z8049 Family history of malignant neoplasm of other genital organs: Secondary | ICD-10-CM

## 2022-05-18 DIAGNOSIS — N183 Chronic kidney disease, stage 3 unspecified: Secondary | ICD-10-CM | POA: Diagnosis present

## 2022-05-18 DIAGNOSIS — L03115 Cellulitis of right lower limb: Secondary | ICD-10-CM | POA: Diagnosis present

## 2022-05-18 DIAGNOSIS — I480 Paroxysmal atrial fibrillation: Secondary | ICD-10-CM | POA: Diagnosis not present

## 2022-05-18 DIAGNOSIS — K5730 Diverticulosis of large intestine without perforation or abscess without bleeding: Secondary | ICD-10-CM | POA: Diagnosis not present

## 2022-05-18 DIAGNOSIS — Z86010 Personal history of colonic polyps: Secondary | ICD-10-CM

## 2022-05-18 DIAGNOSIS — R079 Chest pain, unspecified: Secondary | ICD-10-CM | POA: Diagnosis not present

## 2022-05-18 DIAGNOSIS — Z8719 Personal history of other diseases of the digestive system: Secondary | ICD-10-CM

## 2022-05-18 DIAGNOSIS — K760 Fatty (change of) liver, not elsewhere classified: Secondary | ICD-10-CM | POA: Diagnosis present

## 2022-05-18 DIAGNOSIS — K8000 Calculus of gallbladder with acute cholecystitis without obstruction: Secondary | ICD-10-CM | POA: Diagnosis present

## 2022-05-18 DIAGNOSIS — Z7901 Long term (current) use of anticoagulants: Secondary | ICD-10-CM

## 2022-05-18 DIAGNOSIS — R710 Precipitous drop in hematocrit: Secondary | ICD-10-CM | POA: Diagnosis not present

## 2022-05-18 DIAGNOSIS — Z8249 Family history of ischemic heart disease and other diseases of the circulatory system: Secondary | ICD-10-CM

## 2022-05-18 DIAGNOSIS — N200 Calculus of kidney: Secondary | ICD-10-CM | POA: Diagnosis not present

## 2022-05-18 DIAGNOSIS — I700 Atherosclerosis of aorta: Secondary | ICD-10-CM | POA: Diagnosis not present

## 2022-05-18 DIAGNOSIS — F17220 Nicotine dependence, chewing tobacco, uncomplicated: Secondary | ICD-10-CM | POA: Diagnosis present

## 2022-05-18 DIAGNOSIS — Z888 Allergy status to other drugs, medicaments and biological substances status: Secondary | ICD-10-CM

## 2022-05-18 DIAGNOSIS — Y839 Surgical procedure, unspecified as the cause of abnormal reaction of the patient, or of later complication, without mention of misadventure at the time of the procedure: Secondary | ICD-10-CM | POA: Diagnosis present

## 2022-05-18 DIAGNOSIS — Z743 Need for continuous supervision: Secondary | ICD-10-CM | POA: Diagnosis not present

## 2022-05-18 DIAGNOSIS — K8020 Calculus of gallbladder without cholecystitis without obstruction: Secondary | ICD-10-CM | POA: Diagnosis not present

## 2022-05-18 DIAGNOSIS — E1122 Type 2 diabetes mellitus with diabetic chronic kidney disease: Secondary | ICD-10-CM | POA: Diagnosis not present

## 2022-05-18 DIAGNOSIS — Z6837 Body mass index (BMI) 37.0-37.9, adult: Secondary | ICD-10-CM

## 2022-05-18 DIAGNOSIS — K6289 Other specified diseases of anus and rectum: Secondary | ICD-10-CM | POA: Diagnosis present

## 2022-05-18 DIAGNOSIS — R0689 Other abnormalities of breathing: Secondary | ICD-10-CM | POA: Diagnosis not present

## 2022-05-18 DIAGNOSIS — N281 Cyst of kidney, acquired: Secondary | ICD-10-CM | POA: Diagnosis not present

## 2022-05-18 DIAGNOSIS — N179 Acute kidney failure, unspecified: Secondary | ICD-10-CM | POA: Diagnosis not present

## 2022-05-18 DIAGNOSIS — I25119 Atherosclerotic heart disease of native coronary artery with unspecified angina pectoris: Secondary | ICD-10-CM | POA: Diagnosis present

## 2022-05-18 DIAGNOSIS — R42 Dizziness and giddiness: Secondary | ICD-10-CM | POA: Diagnosis not present

## 2022-05-18 DIAGNOSIS — K4090 Unilateral inguinal hernia, without obstruction or gangrene, not specified as recurrent: Secondary | ICD-10-CM | POA: Diagnosis not present

## 2022-05-18 DIAGNOSIS — E8770 Fluid overload, unspecified: Secondary | ICD-10-CM | POA: Diagnosis not present

## 2022-05-18 DIAGNOSIS — R109 Unspecified abdominal pain: Secondary | ICD-10-CM | POA: Diagnosis not present

## 2022-05-18 DIAGNOSIS — R918 Other nonspecific abnormal finding of lung field: Secondary | ICD-10-CM | POA: Diagnosis not present

## 2022-05-18 DIAGNOSIS — Z9049 Acquired absence of other specified parts of digestive tract: Secondary | ICD-10-CM

## 2022-05-18 DIAGNOSIS — Z9079 Acquired absence of other genital organ(s): Secondary | ICD-10-CM

## 2022-05-18 DIAGNOSIS — Z7982 Long term (current) use of aspirin: Secondary | ICD-10-CM

## 2022-05-18 DIAGNOSIS — R0602 Shortness of breath: Secondary | ICD-10-CM | POA: Diagnosis not present

## 2022-05-18 DIAGNOSIS — R0600 Dyspnea, unspecified: Secondary | ICD-10-CM | POA: Diagnosis not present

## 2022-05-18 DIAGNOSIS — R531 Weakness: Secondary | ICD-10-CM | POA: Diagnosis not present

## 2022-05-18 DIAGNOSIS — E876 Hypokalemia: Secondary | ICD-10-CM | POA: Diagnosis not present

## 2022-05-18 LAB — URINALYSIS, ROUTINE W REFLEX MICROSCOPIC
Bilirubin Urine: NEGATIVE
Glucose, UA: NEGATIVE mg/dL
Ketones, ur: NEGATIVE mg/dL
Nitrite: NEGATIVE
Protein, ur: NEGATIVE mg/dL
RBC / HPF: >50 RBC/hpf — ABNORMAL HIGH (ref 0–5)
Specific Gravity, Urine: 1.038 — ABNORMAL HIGH (ref 1.005–1.030)
pH: 5 (ref 5.0–8.0)

## 2022-05-18 LAB — CBC
HCT: 39.6 % (ref 39.0–52.0)
HCT: 36.7 % — ABNORMAL LOW (ref 39.0–52.0)
Hemoglobin: 13.2 g/dL (ref 13.0–17.0)
Hemoglobin: 12.2 g/dL — ABNORMAL LOW (ref 13.0–17.0)
MCH: 32.1 pg (ref 26.0–34.0)
MCH: 32.3 pg (ref 26.0–34.0)
MCHC: 33.3 g/dL (ref 30.0–36.0)
MCHC: 33.2 g/dL (ref 30.0–36.0)
MCV: 96.4 fL (ref 80.0–100.0)
MCV: 97.1 fL (ref 80.0–100.0)
Platelets: 406 K/uL — ABNORMAL HIGH (ref 150–400)
Platelets: 296 10*3/uL (ref 150–400)
RBC: 4.11 MIL/uL — ABNORMAL LOW (ref 4.22–5.81)
RBC: 3.78 MIL/uL — ABNORMAL LOW (ref 4.22–5.81)
RDW: 15.4 % (ref 11.5–15.5)
RDW: 15.5 % (ref 11.5–15.5)
WBC: 19.3 K/uL — ABNORMAL HIGH (ref 4.0–10.5)
WBC: 16.8 10*3/uL — ABNORMAL HIGH (ref 4.0–10.5)
nRBC: 0 % (ref 0.0–0.2)
nRBC: 0 % (ref 0.0–0.2)

## 2022-05-18 LAB — BASIC METABOLIC PANEL WITH GFR
Anion gap: 9 (ref 5–15)
BUN: 28 mg/dL — ABNORMAL HIGH (ref 8–23)
CO2: 27 mmol/L (ref 22–32)
Calcium: 8.7 mg/dL — ABNORMAL LOW (ref 8.9–10.3)
Chloride: 100 mmol/L (ref 98–111)
Creatinine, Ser: 2.06 mg/dL — ABNORMAL HIGH (ref 0.61–1.24)
GFR, Estimated: 32 mL/min — ABNORMAL LOW (ref >60)
Glucose, Bld: 121 mg/dL — ABNORMAL HIGH (ref 70–99)
Potassium: 3.4 mmol/L — ABNORMAL LOW (ref 3.5–5.1)
Sodium: 136 mmol/L (ref 135–145)

## 2022-05-18 LAB — CREATININE, SERUM
Creatinine, Ser: 1.81 mg/dL — ABNORMAL HIGH (ref 0.61–1.24)
GFR, Estimated: 38 mL/min — ABNORMAL LOW (ref >60)

## 2022-05-18 LAB — TROPONIN I (HIGH SENSITIVITY)
Troponin I (High Sensitivity): 43 ng/L — ABNORMAL HIGH (ref <18)
Troponin I (High Sensitivity): 36 ng/L — ABNORMAL HIGH (ref <18)

## 2022-05-18 LAB — LACTIC ACID, PLASMA
Lactic Acid, Venous: 2 mmol/L (ref 0.5–1.9)
Lactic Acid, Venous: 2.4 mmol/L (ref 0.5–1.9)

## 2022-05-18 MED ORDER — ATORVASTATIN CALCIUM 40 MG PO TABS
40.0000 mg | ORAL_TABLET | Freq: Every day | ORAL | Status: DC
  Administered 2022-05-19 – 2022-05-27 (×8): 40 mg via ORAL
  Filled 2022-05-18 (×8): qty 1

## 2022-05-18 MED ORDER — BACLOFEN 10 MG PO TABS: 10.0000 mg | ORAL_TABLET | Freq: Every evening | ORAL | Status: DC | PRN

## 2022-05-18 MED ORDER — AMIODARONE HCL 200 MG PO TABS
200.0000 mg | ORAL_TABLET | Freq: Every day | ORAL | Status: DC
  Administered 2022-05-19: 200 mg via ORAL
  Filled 2022-05-18: qty 1

## 2022-05-18 MED ORDER — SODIUM CHLORIDE 0.9 % IV BOLUS
1000.0000 mL | Freq: Once | INTRAVENOUS | Status: AC
  Administered 2022-05-18: 1000 mL via INTRAVENOUS

## 2022-05-18 MED ORDER — POLYETHYLENE GLYCOL 3350 17 G PO PACK: 17.0000 g | PACK | Freq: Every day | ORAL | Status: DC | PRN

## 2022-05-18 MED ORDER — VANCOMYCIN HCL 1750 MG/350ML IV SOLN
1750.0000 mg | Freq: Once | INTRAVENOUS | Status: AC
  Administered 2022-05-18: 1750 mg via INTRAVENOUS
  Filled 2022-05-18: qty 350

## 2022-05-18 MED ORDER — OXYCODONE HCL 5 MG PO TABS
5.0000 mg | ORAL_TABLET | ORAL | Status: DC | PRN
  Administered 2022-05-19 – 2022-05-24 (×3): 5 mg via ORAL
  Filled 2022-05-18 (×3): qty 1

## 2022-05-18 MED ORDER — SODIUM CHLORIDE 0.9% FLUSH
3.0000 mL | Freq: Two times a day (BID) | INTRAVENOUS | Status: DC
  Administered 2022-05-18 – 2022-05-26 (×14): 3 mL via INTRAVENOUS

## 2022-05-18 MED ORDER — ACETAMINOPHEN 325 MG PO TABS
650.0000 mg | ORAL_TABLET | Freq: Four times a day (QID) | ORAL | Status: DC | PRN
  Administered 2022-05-24 – 2022-05-26 (×3): 650 mg via ORAL
  Filled 2022-05-18 (×3): qty 2

## 2022-05-18 MED ORDER — POTASSIUM CHLORIDE CRYS ER 20 MEQ PO TBCR
20.0000 meq | EXTENDED_RELEASE_TABLET | Freq: Every day | ORAL | Status: DC
  Administered 2022-05-19: 20 meq via ORAL
  Filled 2022-05-18: qty 1

## 2022-05-18 MED ORDER — ONDANSETRON HCL 4 MG PO TABS: 4.0000 mg | ORAL_TABLET | Freq: Four times a day (QID) | ORAL | Status: DC | PRN

## 2022-05-18 MED ORDER — DOCUSATE SODIUM 100 MG PO CAPS
100.0000 mg | ORAL_CAPSULE | Freq: Two times a day (BID) | ORAL | Status: DC
  Administered 2022-05-19 – 2022-05-27 (×11): 100 mg via ORAL
  Filled 2022-05-18 (×14): qty 1

## 2022-05-18 MED ORDER — ACETAMINOPHEN 650 MG RE SUPP: 650.0000 mg | Freq: Four times a day (QID) | RECTAL | Status: DC | PRN

## 2022-05-18 MED ORDER — VANCOMYCIN HCL 1250 MG/250ML IV SOLN: 1250.0000 mg | INTRAVENOUS | Status: DC

## 2022-05-18 MED ORDER — APIXABAN 5 MG PO TABS
5.0000 mg | ORAL_TABLET | Freq: Two times a day (BID) | ORAL | Status: DC
  Administered 2022-05-18 – 2022-05-21 (×4): 5 mg via ORAL
  Filled 2022-05-18 (×5): qty 1

## 2022-05-18 MED ORDER — ONDANSETRON HCL 4 MG/2ML IJ SOLN: 4.0000 mg | Freq: Four times a day (QID) | INTRAMUSCULAR | Status: DC | PRN

## 2022-05-18 MED ORDER — SODIUM CHLORIDE 0.9% FLUSH
3.0000 mL | INTRAVENOUS | Status: DC | PRN
Start: 2022-05-18 — End: 2022-05-26
  Administered 2022-05-24: 3 mL via INTRAVENOUS

## 2022-05-18 MED ORDER — ALLOPURINOL 100 MG PO TABS
100.0000 mg | ORAL_TABLET | Freq: Every day | ORAL | Status: DC
Start: 2022-05-19 — End: 2022-05-28
  Administered 2022-05-19 – 2022-05-27 (×8): 100 mg via ORAL
  Filled 2022-05-18 (×8): qty 1

## 2022-05-18 MED ORDER — SODIUM CHLORIDE 0.9 % IV SOLN
250.0000 mL | INTRAVENOUS | Status: DC | PRN
Start: 2022-05-18 — End: 2022-05-26
  Administered 2022-05-22 (×2): 250 mL via INTRAVENOUS

## 2022-05-18 MED ORDER — CEFAZOLIN SODIUM-DEXTROSE 1-4 GM/50ML-% IV SOLN
1.0000 g | Freq: Once | INTRAVENOUS | Status: AC
  Administered 2022-05-18: 1 g via INTRAVENOUS
  Filled 2022-05-18: qty 50

## 2022-05-18 MED ORDER — IOHEXOL 300 MG/ML  SOLN
75.0000 mL | Freq: Once | INTRAMUSCULAR | Status: AC | PRN
  Administered 2022-05-18: 75 mL via INTRAVENOUS

## 2022-05-18 MED ORDER — FUROSEMIDE 40 MG PO TABS
40.0000 mg | ORAL_TABLET | Freq: Every day | ORAL | Status: DC
  Administered 2022-05-19: 40 mg via ORAL
  Filled 2022-05-18: qty 1

## 2022-05-18 MED ORDER — SODIUM CHLORIDE 0.9% FLUSH
3.0000 mL | Freq: Two times a day (BID) | INTRAVENOUS | Status: DC
  Administered 2022-05-19 – 2022-05-26 (×10): 3 mL via INTRAVENOUS

## 2022-05-18 NOTE — ED Notes (Signed)
Pt denies diarrhea at this time. Reports loose stools when he was in the the hospital as he was given laxatives as clearance prior to discharge. Will notify MD.

## 2022-05-18 NOTE — Patient Outreach (Signed)
  Care Coordination [facility] Note Transition Care Management Follow-up Telephone Call Date of discharge and from where: 05/17/22 [facility] How have you been since you were released from the hospital? Patient reports feeling weak but improving. Any questions or concerns? No  Items Reviewed: Did the pt receive and understand the discharge instructions provided? Yes  Medications obtained and verified? Yes  Other? No  Any new allergies since your discharge? No  Dietary orders reviewed? Yes Do you have support at home? Yes   Home Care and Equipment/Supplies: Were home health services ordered? yes If so, what is the name of the agency? [facility]  Has the agency set up a time to come to the patient's home? yes Were any new equipment or medical supplies ordered?  No What is the name of the medical supply agency? N/A Were you able to get the supplies/equipment? not applicable Do you have any questions related to the use of the equipment or supplies? No  Functional Questionnaire: (I = Independent and D = Dependent) ADLs: I  Bathing/Dressing- I  Meal Prep- D  Eating- I  Maintaining continence- I  Transferring/Ambulation- I  Managing Meds- I  Follow up appointments reviewed:  PCP Hospital f/u appt confirmed? No   Specialist Hospital f/u appt confirmed? Yes  Scheduled to see NP [facility] cardiology on 05/23/22 @ 0900. Are transportation arrangements needed? Yes , nurse will send community care guide referral If their condition worsens, is the pt aware to call PCP or go to the Emergency Dept.? Yes Was the patient provided with contact information for the PCP's office or ED? Yes Was to pt encouraged to call back with questions or concerns? Yes  SDOH assessments and interventions completed:   Yes  Care Coordination Interventions Activated:  Yes   Care Coordination Interventions:  Transportation arranged    Encounter Outcome:  Pt. Visit Completed    Emelia Loron RN, BSN [place] [phone] [email or web address]

## 2022-05-18 NOTE — ED Triage Notes (Signed)
D/c from here after a triple bypass got home and home health came pt was c/o of generalized weakness and SOB denies CP  on arrival HR 57  BP 60/40 no palpable radial pulses alert oriented normally walks able to stand and sit CBG 128 was supposed to stop dilaudid HCTZ and losartan and took all 3 this am

## 2022-05-18 NOTE — H&P (Addendum)
[place] [address]       Luis Deleon,Luis Deleon Luis Deleon             [phone]          HISTORY AND PHYSICAL   Luis Deleon [place] Medical Record #[id number] Date of Birth: Feb 26, 1941   Chief Complaint:   Weakness and hypotension  Luis Deleon was discharged from the hospital yesterday following coronary bypass grafting on 05/07/2022.  History of Present Illness:   [name] is a 81 year old gentleman with a past history of hypertension, dyslipidemia, Crohn's disease, prostate cancer status post radical prostatectomy, stage III chronic kidney disease, and remote history of tobacco abuse.  He was recently discovered to have coronary artery disease and underwent CABG x3 by Dr. Roxan Hockey on 05/07/2022.  His postoperative course was significant for atrial fibrillation that was treated with amiodarone and initially converted to normal sinus rhythm.  He went on to have a few more episodes of atrial fibrillation.  After consultation with cardiology, the decision was made to start him on Eliquis.  He otherwise made a progressive and satisfactory recovery.  He developed a cellulitis in his right arm apparently associated with an IV infiltration.  This was initially treated with Keflex.  By the time of discharge, he continued to have the induration and was noted to have slight increase in his white blood count.  It was decided to transition to oral Augmentin prior to discharge. Home health nursing was arranged prior to discharge and on the first nurse visit this morning, the patient was noted to have systolic blood pressure in the 60s and heart rate in the high 50s.  He felt poorly in general with weakness and shortness of breath.  He was advised to come to the emergency room.  The patient's daughter, on further inquiry, reported that she had given him all of his usual home medications this morning which included the Dilaudid, losartan, and hydrochlorothiazide which had been  discontinued on the discharge instructions. Work-up in the emergency room has included CBC showing white blood count increasing further to 19,000, lactic acid of 2.0, and creatinine 2.0.  Chest x-ray shows expected postoperative changes with no significant pleural effusion or infiltrates.  CT scan of the abdomen and pelvis to rule out exacerbation of his Crohn's disease was unrevealing.  Blood cultures were obtained with results pending at this time. He denies having any fever since his discharge home yesterday.  He said he was feeling well, was walking around, and was eating okay until after taking his medications this morning. Since admission to the emergency room this morning, he has received 2 L of IV fluid.  His blood pressure has improved to around 100/60.  Heart rate is 63.  He is awake and comfortable resting on the ED stretcher.  He reports having some chest soreness but denies any chest pain.  He also denies any pain in his arm at the site of IV infiltration.  He does report pain in his right thigh over the vein harvest tunnel.  He said he first noticed this while in the hospital that it has become more intense since his discharge yesterday morning.  Current Activity/ Functional Status: Patient is independent with mobility/ambulation, transfers, ADL's, IADL's.   Zubrod Score: At the time of surgery this patient's most appropriate activity status/level should be described as: []     0    Normal activity, no symptoms [x]     1  Restricted in physical strenuous activity but ambulatory, able to do out light work []     2    Ambulatory and capable of self care, unable to do work activities, up and about                 more than 50%  Of the time                            []     3    Only limited self care, in bed greater than 50% of waking hours []     4    Completely disabled, no self care, confined to bed or chair []     5    Moribund  Past Medical History:  Diagnosis Date   Arthritis     Balanoposthitis    Cancer ([place]) 2013   prostate   CKD (chronic kidney disease), stage III ([place])    Coronary artery disease    Crohn disease ([place])    followed by dr Hilarie Fredrickson--- dx 1984, terminal ileum-- prolonged clinical remission since bowel resection 2003   Diverticulosis    Dyspnea    with exertion   Edema of both lower extremities    Erythema    penis   Fatty liver    followed by dr Hilarie Fredrickson   Gout    12-08-2020  per pt last episode approx. 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 Julien Nordmann,  lov in epic 01-28-2013  hx phlebotomy and donates blood   PSVT (paroxysmal supraventricular tachycardia) [facility])    cardiologist--- dr Gwenlyn Found ; episdoe at time of prostatectomy 2006 and recurrent 03/ 2019;  event monitor 01-15-2018 showed NSR,  echo 12-20-2017 with ef 60-65% mild LVH mild TR   Renal cyst, acquired    urologist-- dr Junious Silk   Vitamin D deficiency     Past Surgical History:  Procedure Laterality Date   APPENDECTOMY     BACTERIAL OVERGROWTH TEST N/A 06/15/2016   Procedure: BACTERIAL OVERGROWTH TEST;  Surgeon: Md Physician Gastroenterology, MD;  [facility];  Service:  Gastroenterology;  Laterality: N/A;   COLONOSCOPY WITH PROPOFOL  last one 06-24-2019  dr pyrtle   CORONARY ARTERY BYPASS GRAFT N/A 05/07/2022   Procedure: CORONARY ARTERY BYPASS GRAFTING (CABG) X 3 , USING LEFT INTERNAL MAMMARY ARTERY AND RIGHT LEG GREATER SAPHENOUS VEIN HARVESTED ENDOSCOPICALLY;  Surgeon: Melrose Nakayama, MD;  Location: [facility];  Service: Open Heart Surgery;  Laterality: N/A;   CYSTOSCOPY  WITH URETHRAL DORSAL FORSKIN INCISION  10-10-2018  @[facility]    EXPLORATORY LAPAROTOMY W/ BOWEL RESECTION  05-22-2002  @[facility]    distal ileum and cecum / appendectomy     LAPAROSCOPIC ASSISTED VENTRAL HERNIA REPAIR  05-12-2003  @[facility]    LAPAROSCOPY EXTENSIVE LYSIS ADHESIONS AND VENTRAL HERNIA REPAIR  02-11-2008  @[facility]    LEFT HEART CATH AND CORONARY ANGIOGRAPHY N/A 04/16/2022   Procedure: LEFT HEART CATH AND CORONARY ANGIOGRAPHY;  Surgeon: Lorretta Harp, MD;  Location: [facility];  Service: Cardiovascular;  Laterality: N/A;   LITHOTRIPSY  1980   PENILE BIOPSY N/A 12/13/2020   Procedure: PENILE AND FORESKIN BIOPSY;  Surgeon: Festus Aloe, MD;  Location: [facility];  Service: Urology;  Laterality: N/A;   RETROPUBIC PROSTATECTOMY  11-16-2004   @[facility]    TEE WITHOUT CARDIOVERSION N/A 05/07/2022   Procedure: TRANSESOPHAGEAL ECHOCARDIOGRAM (TEE);  Surgeon: Melrose Nakayama, MD;  Location: [place];  Service: Open Heart Surgery;  Laterality: N/A;   TONSILLECTOMY  age 4    Social History   Tobacco Use  Smoking Status Former   Packs/day: 1.00   Years: 38.00   Total pack years: 38.00   Types: Cigarettes   Quit date: 03/19/1991   Years since quitting: 31.1  Smokeless Tobacco Current   Types: Chew    Social History   Substance and Sexual Activity  Alcohol Use No     Allergies  Allergen Reactions   Colchicine Diarrhea, Nausea And Vomiting and Other (See Comments)    REACTION: nausea, diarrhea and interactions with 3m   Protamine Other (See Comments)    Likely mild  protamine reaction with hypotension during CABG 2023    Current Facility-Administered Medications  Medication Dose Route Frequency Provider Last Rate Last Admin   0.9 %  sodium chloride infusion  250 mL Intravenous PRN Roddenberry, Myron G, PA-C       acetaminophen (TYLENOL) tablet 650 mg  650 mg Oral Q6H PRN Roddenberry, Myron G, PA-C       Or   acetaminophen (TYLENOL) suppository 650 mg  650 mg Rectal Q6H PRN Roddenberry, Myron G, PA-C       [START ON 05/19/2022] allopurinol (ZYLOPRIM) tablet 100 mg  100 mg Oral Daily Roddenberry, Myron G, PA-C       apixaban (ELIQUIS) tablet 5 mg  5 mg Oral BID Roddenberry, Myron G, PA-C       [START ON 05/19/2022] atorvastatin (LIPITOR) tablet 40 mg  40 mg Oral Daily Roddenberry, Myron G, PA-C       baclofen (LIORESAL) tablet 10 mg  10 mg Oral QHS PRN Roddenberry, Myron G, PA-C       ceFAZolin (ANCEF) IVPB 1 g/50 mL premix  1 g Intravenous Once HIsla Pence MD       docusate sodium (COLACE) capsule 100 mg  100 mg Oral BID Roddenberry, Myron G, PA-C       [START ON 05/19/2022] furosemide (LASIX) tablet 40 mg  40 mg Oral Daily Roddenberry, Myron G, PA-C       ondansetron (ZOFRAN) tablet 4 mg  4 mg Oral Q6H PRN Roddenberry, Myron G, PA-C       Or   ondansetron (ZOFRAN) injection 4 mg  4 mg Intravenous Q6H PRN Roddenberry, Myron G, PA-C       oxyCODONE (Oxy IR/ROXICODONE) immediate release tablet 5 mg  5 mg Oral Q4H PRN Roddenberry, Myron G, PA-C       polyethylene glycol (MIRALAX / GLYCOLAX) packet 17 g  17 g Oral Daily PRN Antony Odea, PA-C       [START ON 05/19/2022] potassium chloride SA (KLOR-CON M) CR tablet 20 mEq  20 mEq Oral Daily Roddenberry, Myron G, PA-C       sodium chloride flush (NS) 0.9 % injection 3 mL  3 mL Intravenous Q12H Roddenberry, Myron G, PA-C       sodium chloride flush (NS) 0.9 % injection 3 mL  3 mL Intravenous Q12H Roddenberry, Myron G, PA-C       sodium chloride flush (NS) 0.9 % injection 3 mL  3 mL Intravenous PRN  Roddenberry, Myron G, PA-C       Current Outpatient Medications  Medication Sig Dispense Refill   allopurinol (ZYLOPRIM) 100 MG tablet Take 1 tablet (100 mg total) by mouth 2 (two) times daily. 180 tablet 3   amiodarone (PACERONE) 200 MG tablet Take 1 tablet (200 mg total) by mouth daily. 30 tablet 2   amoxicillin-clavulanate (AUGMENTIN) 500-125 MG tablet Take 1 tablet (500 mg total) by mouth every 8 (eight) hours. 21 tablet 0   apixaban (ELIQUIS) 5 MG TABS tablet Take 1 tablet (5 mg total) by mouth 2 (two) times daily. 60 tablet 3   baclofen (LIORESAL) 10 MG tablet TAKE 1 TABLET BY MOUTH AT BEDTIME AS NEEDED FOR MUSCLE SPASMS. (Patient taking differently: Take 10 mg by mouth at bedtime as needed for muscle spasms.) 90 tablet 3   Cholecalciferol (VITAMIN D3) 125 MCG (5000 UT) CAPS Take 5,000 Units by mouth See admin instructions. Take 1 capsule (5000 units) by mouth every day except on Sundays.     gemfibrozil (LOPID) 600 MG tablet Take 1 tablet (600 mg total) by mouth 3 (three) times a week. Monday , Wednesday, Friday (Patient taking differently: Take 600 mg by mouth every Monday, Wednesday, and Friday.) 39 tablet 3   hydrochlorothiazide (HYDRODIURIL) 12.5 MG tablet Take 12.5 mg by mouth daily.     losartan (COZAAR) 25 MG tablet Take 25 mg by mouth daily.     metoprolol tartrate (LOPRESSOR) 100 MG tablet Take 1 tablet (100 mg total) by mouth 2 (two) times daily. 60 tablet 3   nystatin-triamcinolone (MYCOLOG II) cream Apply 1 application topically 2 (two) times daily. (Patient taking differently: Apply 1 application  topically 2 (two) times daily as needed (rash).) 30 g 3   Omega-3 Fatty Acids (OMEGA-3 FISH OIL) 1200 MG CAPS Take 2,400 mg by mouth in the morning and at bedtime.     potassium chloride SA (KLOR-CON M20) 20 MEQ tablet Take 2 tablets (40 mEq total) by mouth daily. 180 tablet 3   traMADol (ULTRAM) 50 MG tablet Take 1 tablet (50 mg total) by mouth every 6 (six) hours as needed for  moderate pain. 30 tablet 0   aspirin 81 MG EC tablet Take 81 mg by mouth every evening. (Patient not taking: Reported on 06/06/2022)     atorvastatin (LIPITOR) 40 MG tablet TAKE 1 TABLET BY MOUTH DAILY AT 6 PM. (Patient not taking: Reported on 05/23/2022) 90 tablet 3    (Not in a hospital admission)   Family History  Problem Relation Age of Onset   Heart disease Mother    Cervical cancer Mother    Heart disease Father    Heart failure Father    Dislocations Daughter 8       Bilateral knee   Heart disease Maternal Uncle    Colon cancer Neg Hx    Esophageal cancer Neg Hx  Rectal cancer Neg Hx    Stomach cancer Neg Hx    Colon polyps Neg Hx      Review of Systems:       Cardiac Review of Systems: Y or  [    ]= no  Chest Pain [soreness]  Resting SOB [   ] Exertional SOB  [ X ]  Orthopnea [  ]   Pedal Edema [   ]    Palpitations [  ] Syncope  [  ]   Presyncope [   ]  General Review of Systems: [Y] = yes [  ]=no Constitional: recent weight change [  ]; anorexia [  ]; fatigue [  ]; nausea [  ]; night sweats [  ]; fever[  ]; or chills ]                                                                 Eye : blurred vision [  ]; diplopia [   ]; vision changes [  ];  Amaurosis fugax[  ]; Resp: cough [  ];  wheezing[  ];  hemoptysis[  ]; shortness of breath[ X ]; paroxysmal nocturnal dyspnea[  ]; dyspnea on exertion[  ]; or orthopnea[  ];  GI:  gallstones[  ], vomiting[  ];  dysphagia[  ]; melena[  ];  hematochezia [  ]; heartburn[  ];   Hx of  Colonoscopy[  ]; GU: kidney stones [  ]; hematuria[  ];   dysuria [  ];  nocturia[  ];  history of     obstruction [  ]; urinary frequency [  ]             Skin: rash, swelling[  ];, hair loss[  ];  peripheral edema[  ];  or itching[  ]; Musculosketetal: myalgias[  ];  joint swelling[  ];  joint erythema[  ];  joint pain[  ];  back pain[  ];  Heme/Lymph: bruising[  ];  bleeding[  ];  anemia[  ];  Neuro: TIA[  ];  headaches[  ];  stroke[  ];   vertigo[  ];  seizures[  ];   paresthesias[  ];  difficulty walking[  ];  Psych:depression[  ]; anxiety[  ];  Endocrine: diabetes[  ];  thyroid dysfunction[  ];                  Physical Exam: BP 96/61   Pulse 63   Temp 97.6 F (36.4 C) (Oral)   Resp 16   Ht 5' 8"  (1.727 m)   Wt 95.5 kg   SpO2 97%   BMI 32.01 kg/m    General appearance: alert, cooperative, and mild distress Head: Normocephalic, without obvious abnormality, atraumatic Neck: no adenopathy, no carotid bruit, no JVD, and supple, symmetrical, trachea midline Resp: Breath sounds are full, equal, and clear to auscultation.  Chest x-ray was reviewed and has no unexpected changes. Cardio: Regular rate and rhythm, no murmur. GI: Obese.  Active bowel sounds, no tenderness. Extremities: All extremities are warm and well-perfused.  The indurated site on dorsal surface of the right forearm where the IV had infiltrated is indurated but is not inflamed or tender.  There is a 8 x 12 cm area of  inflammation and induration on the mid medial right thigh.  The entire area is tender and warm to touch.  This overlies the vein harvest tunnel.  There is no fluctuance.  The EVH incision below the knee is healing with no sign of complication.  There is no drainage. Neurologic: Grossly normal  Diagnostic Studies & Laboratory data:     Recent Radiology Findings:   CT ABDOMEN PELVIS W CONTRAST  Result Date: 05/20/2022 CLINICAL DATA:  Crohn's exacerbation EXAM: CT ABDOMEN AND PELVIS WITH CONTRAST TECHNIQUE: Multidetector CT imaging of the abdomen and pelvis was performed using the standard protocol following bolus administration of intravenous contrast. RADIATION DOSE REDUCTION: This exam was performed according to the departmental dose-optimization program which includes automated exposure control, adjustment of the mA and/or kV according to patient size and/or use of iterative reconstruction technique. CONTRAST:  102m OMNIPAQUE IOHEXOL 300 MG/ML   SOLN COMPARISON:  CT 01/18/2020 FINDINGS: Lower chest: Small left and trace right pleural effusions with adjacent basilar basilar atelectasis. Small pericardial effusion and retrosternal fluid. Recent median sternotomy with trace residual mediastinal gas. Per medical record, patient recently had a coronary artery bypass 05/07/2022. Hepatobiliary: Unchanged multiple small liver hypodensities, likely small cysts. Cholelithiasis. No evidence of cholecystitis. Pancreas: Unremarkable. No pancreatic ductal dilatation or surrounding inflammatory changes. Spleen: Normal in size without focal abnormality. Adrenals/Urinary Tract: Adrenal glands are unremarkable. There are multiple renal cysts of varying sizes, not simply changed from prior exam in April 2021. No follow-up imaging is recommended. No hydronephrosis. There are small nonobstructive stones in the right kidney largest measuring up to 3 mm. There are a few 2 mm nonobstructive stones in the left kidney as well. Bladder is mildly distended. Stomach/Bowel: No significant renal excretion on delay, suggesting poor renal function. There is no evidence of bowel obstruction.Prior appendectomy Vascular/Lymphatic: Aortoiliac atherosclerosis. No AAA. No lymphadenopathy. Reproductive: Prior prostatectomy. Other: Small fat containing inguinal hernias. Prior ventral hernia repair without evidence of ventral hernia recurrence. No ascites. No free air. Musculoskeletal: No acute osseous abnormality. No suspicious osseous lesion. Multilevel degenerative changes of the spine. IMPRESSION: No acute findings in the abdomen or pelvis. Small left and trace right pleural effusions with adjacent basilar atelectasis. Partially visualized postsurgical changes of recent median sternotomy. Trace residual mediastinal gas and small amount of retrosternal fluid. Small pericardial effusion. Cholelithiasis without evidence of cholecystitis. Prior ventral hernia repair without evidence of recurrent  hernia. Electronically Signed   By: JMaurine SimmeringM.D.   On: 05/19/2022 14:52   DG Chest Port 1 View  Result Date: 06/09/2022 CLINICAL DATA:  CABG surgery on 05/07/2022 with weakness and lightheadedness following. Shortness of breath. EXAM: PORTABLE CHEST 1 VIEW COMPARISON:  Chest radiograph 05/12/2022 FINDINGS: Median sternotomy wires are again seen. The cardiomediastinal silhouette is stable with unchanged borderline cardiomegaly. There is a small left pleural effusion with adjacent left basilar opacities, grossly similar to the prior study. There is no significant right effusion. There is no focal consolidation on the right. There is no pulmonary edema. There is no pneumothorax There is no acute osseous abnormality. IMPRESSION: Unchanged small left pleural effusion with adjacent left basilar opacities. New or worsening focal airspace disease. Electronically Signed   By: PValetta MoleM.D.   On: 06/07/2022 12:55     I have independently reviewed the above radiologic studies and discussed with the patient   Recent Lab Findings: Lab Results  Component Value Date   WBC 19.3 (H) 06/05/2022   HGB 13.2 06/08/2022   HCT 39.6  06/09/2022   PLT 406 (H) 05/21/2022   GLUCOSE 121 (H) 06/13/2022   CHOL 111 01/22/2022   TRIG 140 01/22/2022   HDL 34 (L) 01/22/2022   LDLCALC 52 01/22/2022   ALT 20 05/03/2022   AST 28 05/03/2022   NA 136 06/01/2022   K 3.4 (L) 06/02/2022   CL 100 06/02/2022   CREATININE 2.06 (H) 05/30/2022   BUN 28 (H) 05/27/2022   CO2 27 06/02/2022   TSH 1.957 05/09/2022   INR 1.4 (H) 05/07/2022   HGBA1C 5.5 05/03/2022      Assessment / Plan:      -Hypotension and weakness-most likely related to overmedication with narcotic and antihypertensive agents  which were mistakenly given this morning at home.  Blood pressure has improved throughout the day with fluid resuscitation and he is feeling much better in general.  -Cellulitis right thigh post endoscopic vein harvest for CABG on  05/07/2022.  This accounts for the worsening leukocytosis and elevated lactic acid.  We will need to admit to the hospital for IV antibiotics.   -Coronary artery disease, status post CABG presenting with angina and preserved left ventricular ejection fraction.  -Postoperative atrial fibrillation, currently in sinus rhythm.  Continue with use amiodarone and Eliquis.  Need to correct potassium and keep above 4.0.  -Chronic kidney disease-creatinine had normalized prior to discharge but is increased to 2.0 today and likely exacerbated by hypotension.  Monitor.  -DVT prophylaxis-would not start enoxaparin since he is already on Eliquis for paroxysmal atrial fibrillation.    I  spent 25 minutes counseling the patient face to face.   Antony Odea, PA-C  06/08/2022 4:37 PM  Mr. Wogan was seen and examined in the ED on the day of admission.  Had just been discharged after CABG.  Presented back with hypotension, likely multifactorial and also cellulitis of right thigh with seroma post vein harvest.  Mild metabolic acidosis, elevated creatinine.  Admitted for IV hydration, BP monitoring, IV antibiotics for cellulitis and monitoring of general medical status.  Revonda Standard Roxan Hockey, MD [facility] [phone]

## 2022-05-18 NOTE — Progress Notes (Signed)
Pharmacy Antibiotic Note  Luis Deleon is a 81 y.o. male admitted on 05/26/2022 presenting with SOB, s/p recent CABG, pt discharged on augmentin, concern for worsening cellulitis.  Pharmacy has been consulted for vancomycin dosing.  Plan: Vancomycin 1750 mg IV x 1, then 1250 mg IV q 48h (eAUC 465, SCr2.06) Monitor renal function, clinical progression and ability to narrow Vancomycin levels as needed  Height: 5' 8"  (172.7 cm) Weight: 95.5 kg (210 lb 8.6 oz) IBW/kg (Calculated) : 68.4  Temp (24hrs), Avg:97.6 F (36.4 C), Min:97.6 F (36.4 C), Max:97.6 F (36.4 C)  Recent Labs  Lab 05/13/22 0327 05/14/22 0403 05/15/22 0328 05/16/22 0130 05/17/22 0229 05/25/2022 1222 06/02/2022 1328 06/12/2022 1441  WBC  --  11.0* 12.2* 14.8* 16.6* 19.3*  --   --   CREATININE 1.11  --   --   --   --  2.06*  --   --   LATICACIDVEN  --   --   --   --   --   --  2.0* 2.4*    Estimated Creatinine Clearance: 33.1 mL/min (A) (by C-G formula based on SCr of 2.06 mg/dL (H)).    Allergies  Allergen Reactions   Colchicine Diarrhea, Nausea And Vomiting and Other (See Comments)    REACTION: nausea, diarrhea and interactions with 3m   Protamine Other (See Comments)    Likely mild protamine reaction with hypotension during CABG 2023    JBertis Ruddy PharmD Clinical Pharmacist ED Pharmacist Phone # [phone]/01/2022 4:38 PM

## 2022-05-18 NOTE — ED Provider Notes (Signed)
[facility] EMERGENCY DEPARTMENT Provider Note   CSN: [id number] Arrival date & time: 05/22/2022  1204     History  Chief Complaint  Patient presents with   Shortness of Breath    Luis Deleon is a 81 y.o. male.  Pt is a 81 yo male with a pmhx significant for CAD (s/p CABG on 7/24), Crohn's disease, SBO, prostate cancer, CKD, PSVT, Afib on Eliquis, and kidney stones.  Pt was admitted from 7/24-8/3 with CABG.  He felt extremely weak today.  EMS was called and found him to be hypotensive.  His BP was 60/40.  He was given 500 cc NS en route.  Pt was told to stop hctz, dilaudid, and losartan.  EMS reports that he took those.  Pt is not sure.  He said his wife organizes his meds.  Pt said he's been having diarrhea and rectal pain.  EF on 7/27 was 55-60%  I did verify with pt's daughter that he took the losartan and the hctz + metoprolol.  She said she arranges his meds and was not told of the changes.   Medication instruction at d/c    STOP taking these medications    hydrochlorothiazide 12.5 MG tablet Commonly known as: HYDRODIURIL   HYDROmorphone 2 MG tablet Commonly known as: DILAUDID   losartan 25 MG tablet Commonly known as: COZAAR         TAKE these medications    allopurinol 100 MG tablet Commonly known as: ZYLOPRIM Take 1 tablet (100 mg total) by mouth 2 (two) times daily.   amiodarone 200 MG tablet Commonly known as: PACERONE Take 1 tablet (200 mg total) by mouth daily.   amoxicillin-clavulanate 500-125 MG tablet Commonly known as: AUGMENTIN Take 1 tablet (500 mg total) by mouth every 8 (eight) hours.   apixaban 5 MG Tabs tablet Commonly known as: ELIQUIS Take 1 tablet (5 mg total) by mouth 2 (two) times daily.   aspirin EC 81 MG tablet Take 81 mg by mouth every evening.   atorvastatin 40 MG tablet Commonly known as: LIPITOR TAKE 1 TABLET BY MOUTH DAILY AT 6 PM.   baclofen 10 MG tablet Commonly known as: LIORESAL TAKE 1 TABLET BY  MOUTH AT BEDTIME AS NEEDED FOR MUSCLE SPASMS.   gemfibrozil 600 MG tablet Commonly known as: LOPID Take 1 tablet (600 mg total) by mouth 3 (three) times a week. Monday , Wednesday, Friday   metoprolol tartrate 100 MG tablet Commonly known as: LOPRESSOR Take 1 tablet (100 mg total) by mouth 2 (two) times daily. What changed:   medication strength  how much to take   nystatin-triamcinolone cream Commonly known as: MYCOLOG II Apply 1 application topically 2 (two) times daily. What changed:   when to take this  reasons to take this   Omega-3 Fish Oil 1200 MG Caps Take 2,400 mg by mouth in the morning and at bedtime.   potassium chloride SA 20 MEQ tablet Commonly known as: Klor-Con M20 Take 2 tablets (40 mEq total) by mouth daily.   traMADol 50 MG tablet Commonly known as: ULTRAM Take 1 tablet (50 mg total) by mouth every 6 (six) hours as needed for moderate pain.   Vitamin D3 125 MCG (5000 UT) Caps Take 5,000 Units by mouth See admin instructions. Take 1 tablet by mouth every day except on Sundays.               Home Medications Prior to Admission medications   Medication Sig  Start Date End Date Taking? Authorizing Provider  allopurinol (ZYLOPRIM) 100 MG tablet Take 1 tablet (100 mg total) by mouth 2 (two) times daily. 01/15/22  Yes Dettinger, Fransisca Kaufmann, MD  amiodarone (PACERONE) 200 MG tablet Take 1 tablet (200 mg total) by mouth daily. 05/17/22  Yes Barrett, Erin R, PA-C  amoxicillin-clavulanate (AUGMENTIN) 500-125 MG tablet Take 1 tablet (500 mg total) by mouth every 8 (eight) hours. 05/17/22  Yes Barrett, Erin R, PA-C  apixaban (ELIQUIS) 5 MG TABS tablet Take 1 tablet (5 mg total) by mouth 2 (two) times daily. 05/15/22  Yes Barrett, Erin R, PA-C  baclofen (LIORESAL) 10 MG tablet TAKE 1 TABLET BY MOUTH AT BEDTIME AS NEEDED FOR MUSCLE SPASMS. Patient taking differently: Take 10 mg by mouth at bedtime as needed for muscle spasms. 01/15/22  Yes Dettinger, Fransisca Kaufmann, MD   Cholecalciferol (VITAMIN D3) 125 MCG (5000 UT) CAPS Take 5,000 Units by mouth See admin instructions. Take 1 capsule (5000 units) by mouth every day except on Sundays.   Yes [provider]  gemfibrozil (LOPID) 600 MG tablet Take 1 tablet (600 mg total) by mouth 3 (three) times a week. Monday , Wednesday, Friday Patient taking differently: Take 600 mg by mouth every Monday, Wednesday, and Friday. 01/15/22  Yes Dettinger, Fransisca Kaufmann, MD  hydrochlorothiazide (HYDRODIURIL) 12.5 MG tablet Take 12.5 mg by mouth daily.   Yes [provider]  losartan (COZAAR) 25 MG tablet Take 25 mg by mouth daily.   Yes [provider]  metoprolol tartrate (LOPRESSOR) 100 MG tablet Take 1 tablet (100 mg total) by mouth 2 (two) times daily. 05/15/22  Yes Barrett, Erin R, PA-C  nystatin-triamcinolone (MYCOLOG II) cream Apply 1 application topically 2 (two) times daily. Patient taking differently: Apply 1 application  topically 2 (two) times daily as needed (rash). 03/05/18  Yes Chipper Herb, MD  Omega-3 Fatty Acids (OMEGA-3 FISH OIL) 1200 MG CAPS Take 2,400 mg by mouth in the morning and at bedtime.   Yes [provider]  potassium chloride SA (KLOR-CON M20) 20 MEQ tablet Take 2 tablets (40 mEq total) by mouth daily. 10/02/21  Yes Dettinger, Fransisca Kaufmann, MD  traMADol (ULTRAM) 50 MG tablet Take 1 tablet (50 mg total) by mouth every 6 (six) hours as needed for moderate pain. 05/15/22  Yes Barrett, Erin R, PA-C  aspirin 81 MG EC tablet Take 81 mg by mouth every evening. Patient not taking: Reported on 05/17/2022    [provider]  atorvastatin (LIPITOR) 40 MG tablet TAKE 1 TABLET BY MOUTH DAILY AT 6 PM. Patient not taking: Reported on 06/06/2022 09/20/21   Dettinger, Fransisca Kaufmann, MD      Allergies    Colchicine and Protamine    Review of Systems   Review of Systems  Gastrointestinal:  Positive for diarrhea.  Neurological:  Positive for dizziness and weakness.  All other systems reviewed  and are negative.   Physical Exam Updated Vital Signs BP (!) 98/58   Pulse 94   Temp 97.6 F (36.4 C) (Oral)   Resp 18   Ht 5' 8"  (1.727 m)   Wt 95.5 kg   SpO2 94%   BMI 32.01 kg/m  Physical Exam Vitals and nursing note reviewed.  Constitutional:      General: He is in acute distress.     Appearance: He is well-developed. He is obese. He is ill-appearing.  HENT:     Head: Normocephalic and atraumatic.     Mouth/Throat:  Mouth: Mucous membranes are dry.     Pharynx: Oropharynx is clear.  Eyes:     Extraocular Movements: Extraocular movements intact.     Pupils: Pupils are equal, round, and reactive to light.  Cardiovascular:     Rate and Rhythm: Regular rhythm. Bradycardia present.  Pulmonary:     Effort: Pulmonary effort is normal.     Breath sounds: Normal breath sounds.  Chest:     Comments: Healing sternotomy wounds Musculoskeletal:     Cervical back: Normal range of motion and neck supple.     Comments: RLE with some mild swelling and bruising s/p vein harvesting  Skin:    General: Skin is warm.     Capillary Refill: Capillary refill takes 2 to 3 seconds.  Neurological:     General: No focal deficit present.     Mental Status: He is alert and oriented to person, place, and time.  Psychiatric:        Mood and Affect: Mood normal.        Behavior: Behavior normal.     ED Results / Procedures / Treatments   Labs (all labs ordered are listed, but only abnormal results are displayed) Labs Reviewed  BASIC METABOLIC PANEL - Abnormal; Notable for the following components:      Result Value   Potassium 3.4 (*)    Glucose, Bld 121 (*)    BUN 28 (*)    Creatinine, Ser 2.06 (*)    Calcium 8.7 (*)    GFR, Estimated 32 (*)    All other components within normal limits  CBC - Abnormal; Notable for the following components:   WBC 19.3 (*)    RBC 4.11 (*)    Platelets 406 (*)    All other components within normal limits  LACTIC ACID, PLASMA - Abnormal; Notable  for the following components:   Lactic Acid, Venous 2.0 (*)    All other components within normal limits  LACTIC ACID, PLASMA - Abnormal; Notable for the following components:   Lactic Acid, Venous 2.4 (*)    All other components within normal limits  TROPONIN I (HIGH SENSITIVITY) - Abnormal; Notable for the following components:   Troponin I (High Sensitivity) 43 (*)    All other components within normal limits  TROPONIN I (HIGH SENSITIVITY) - Abnormal; Notable for the following components:   Troponin I (High Sensitivity) 36 (*)    All other components within normal limits  CULTURE, BLOOD (ROUTINE X 2)  CULTURE, BLOOD (ROUTINE X 2)  GASTROINTESTINAL PANEL BY PCR, STOOL (REPLACES STOOL CULTURE)  C DIFFICILE QUICK SCREEN W PCR REFLEX    URINALYSIS, ROUTINE W REFLEX MICROSCOPIC    EKG EKG Interpretation  Date/Time:  Friday May 18 2022 12:34:30 EDT Ventricular Rate:  58 PR Interval:  186 QRS Duration: 107 QT Interval:  592 QTC Calculation: 582 R Axis:   85 Text Interpretation: Sinus rhythm Borderline right axis deviation Low voltage, precordial leads Prolonged QT interval No significant change since last tracing Confirmed by Isla Pence [phone]) on 05/29/2022 12:36:29 PM  Radiology CT ABDOMEN PELVIS W CONTRAST  Result Date: 05/19/2022 CLINICAL DATA:  Crohn's exacerbation EXAM: CT ABDOMEN AND PELVIS WITH CONTRAST TECHNIQUE: Multidetector CT imaging of the abdomen and pelvis was performed using the standard protocol following bolus administration of intravenous contrast. RADIATION DOSE REDUCTION: This exam was performed according to the departmental dose-optimization program which includes automated exposure control, adjustment of the mA and/or kV according to patient size and/or use  of iterative reconstruction technique. CONTRAST:  41m OMNIPAQUE IOHEXOL 300 MG/ML  SOLN COMPARISON:  CT 01/18/2020 FINDINGS: Lower chest: Small left and trace right pleural effusions with adjacent  basilar basilar atelectasis. Small pericardial effusion and retrosternal fluid. Recent median sternotomy with trace residual mediastinal gas. Per medical record, patient recently had a coronary artery bypass 05/07/2022. Hepatobiliary: Unchanged multiple small liver hypodensities, likely small cysts. Cholelithiasis. No evidence of cholecystitis. Pancreas: Unremarkable. No pancreatic ductal dilatation or surrounding inflammatory changes. Spleen: Normal in size without focal abnormality. Adrenals/Urinary Tract: Adrenal glands are unremarkable. There are multiple renal cysts of varying sizes, not simply changed from prior exam in April 2021. No follow-up imaging is recommended. No hydronephrosis. There are small nonobstructive stones in the right kidney largest measuring up to 3 mm. There are a few 2 mm nonobstructive stones in the left kidney as well. Bladder is mildly distended. Stomach/Bowel: No significant renal excretion on delay, suggesting poor renal function. There is no evidence of bowel obstruction.Prior appendectomy Vascular/Lymphatic: Aortoiliac atherosclerosis. No AAA. No lymphadenopathy. Reproductive: Prior prostatectomy. Other: Small fat containing inguinal hernias. Prior ventral hernia repair without evidence of ventral hernia recurrence. No ascites. No free air. Musculoskeletal: No acute osseous abnormality. No suspicious osseous lesion. Multilevel degenerative changes of the spine. IMPRESSION: No acute findings in the abdomen or pelvis. Small left and trace right pleural effusions with adjacent basilar atelectasis. Partially visualized postsurgical changes of recent median sternotomy. Trace residual mediastinal gas and small amount of retrosternal fluid. Small pericardial effusion. Cholelithiasis without evidence of cholecystitis. Prior ventral hernia repair without evidence of recurrent hernia. Electronically Signed   By: JMaurine SimmeringM.D.   On: 05/23/2022 14:52   DG Chest Port 1 View  Result Date:  05/15/2022 CLINICAL DATA:  CABG surgery on 05/07/2022 with weakness and lightheadedness following. Shortness of breath. EXAM: PORTABLE CHEST 1 VIEW COMPARISON:  Chest radiograph 05/12/2022 FINDINGS: Median sternotomy wires are again seen. The cardiomediastinal silhouette is stable with unchanged borderline cardiomegaly. There is a small left pleural effusion with adjacent left basilar opacities, grossly similar to the prior study. There is no significant right effusion. There is no focal consolidation on the right. There is no pulmonary edema. There is no pneumothorax There is no acute osseous abnormality. IMPRESSION: Unchanged small left pleural effusion with adjacent left basilar opacities. New or worsening focal airspace disease. Electronically Signed   By: PValetta MoleM.D.   On: 05/22/2022 12:55    Procedures Procedures    Medications Ordered in ED Medications  ceFAZolin (ANCEF) IVPB 1 g/50 mL premix (has no administration in time range)  sodium chloride 0.9 % bolus 1,000 mL (1,000 mLs Intravenous Bolus 05/23/2022 1232)  sodium chloride 0.9 % bolus 1,000 mL (0 mLs Intravenous Stopped 06/14/2022 1438)  iohexol (OMNIPAQUE) 300 MG/ML solution 75 mL (75 mLs Intravenous Contrast Given 06/01/2022 1429)  sodium chloride 0.9 % bolus 1,000 mL (1,000 mLs Intravenous New Bag/Given 05/22/2022 1532)    ED Course/ Medical Decision Making/ A&P                           Medical Decision Making Amount and/or Complexity of Data Reviewed Labs: ordered. Radiology: ordered.  Risk Prescription drug management. Decision regarding hospitalization.   This patient presents to the ED for concern of weakness, this involves an extensive number of treatment options, and is a complaint that carries with it a high risk of complications and morbidity.  The differential diagnosis includes hypotension, electrolyte  abn,    Co morbidities that complicate the patient evaluation  CAD (s/p CABG on 7/24), Crohn's disease, SBO, prostate  cancer, CKD, PSVT, Afib on Eliquis, and kidney stones   Additional history obtained:  Additional history obtained from epic chart review External records from outside source obtained and reviewed including EMS report   Lab Tests:  I Ordered, and personally interpreted labs.  The pertinent results include:  cbc with wbc elevated at 19; cmp with cr 2.06 (Cr 1.1 on 7/30); trop 43   Imaging Studies ordered:  I ordered imaging studies including CXR and ct abd/pelvis I independently visualized and interpreted imaging which showed  CXR: IMPRESSION:  Unchanged small left pleural effusion with adjacent left basilar  opacities. New or worsening focal airspace disease.  CT abd/pelvis: IMPRESSION:  No acute findings in the abdomen or pelvis.    Small left and trace right pleural effusions with adjacent basilar  atelectasis.    Partially visualized postsurgical changes of recent median  sternotomy. Trace residual mediastinal gas and small amount of  retrosternal fluid. Small pericardial effusion.    Cholelithiasis without evidence of cholecystitis.    Prior ventral hernia repair without evidence of recurrent hernia.   I agree with the radiologist interpretation   Cardiac Monitoring:  The patient was maintained on a cardiac monitor.  I personally viewed and interpreted the cardiac monitored which showed an underlying rhythm of: sinus brady   Medicines ordered and prescription drug management:  I ordered medication including IVFs  for hypotension  Reevaluation of the patient after these medicines showed that the patient improved I have reviewed the patients home medicines and have made adjustments as needed   Test Considered:  ct   Critical Interventions:  IVFs   Consultations Obtained:  I requested consultation with the CTS,  and discussed lab and imaging findings as well as pertinent plan - they will admit.  Problem List / ED Course:  Hypotension:  likely due to  the accidental ingestion of bp medication this am.  Pt is responding to IVFs. Pt does have some cellulitis, so I will start on ancef.  Blood cultures have been drawn.  Lactic is elevated/   Reevaluation:  After the interventions noted above, I reevaluated the patient and found that they have :improved   Social Determinants of Health:  Lives at home   Dispostion:  After consideration of the diagnostic results and the patients response to treatment, I feel that the patent would benefit from admission.    CRITICAL CARE Performed by: Isla Pence   Total critical care time: 30 minutes  Critical care time was exclusive of separately billable procedures and treating other patients.  Critical care was necessary to treat or prevent imminent or life-threatening deterioration.  Critical care was time spent personally by me on the following activities: development of treatment plan with patient and/or surrogate as well as nursing, discussions with consultants, evaluation of patient's response to treatment, examination of patient, obtaining history from patient or surrogate, ordering and performing treatments and interventions, ordering and review of laboratory studies, ordering and review of radiographic studies, pulse oximetry and re-evaluation of patient's condition.         Final Clinical Impression(s) / ED Diagnoses Final diagnoses:  Hypotension, unspecified hypotension type  AKI (acute kidney injury) [facility])    Rx / [place] Orders ED Discharge Orders     None         Isla Pence, MD 06/14/2022 1617

## 2022-05-18 NOTE — Telephone Encounter (Signed)
   Telephone encounter was:  Successful.  06/02/2022 Name: [name] MRN: [id number] DOB: 1941/02/09  Luis Deleon is a 81 y.o. year old male who is a primary care patient of Dettinger, Fransisca Kaufmann, MD . The community resource team was consulted for assistance with Transportation Needs   Care guide performed the following interventions: Patient provided with information about care guide support team and interviewed to confirm resource needs. Spoke to Spouse and Daughter as patient is currently in ED. It has been stated that transportation is only needed for afternoon appointments as daughter takes dad to morning appointments. CG has advised family that [employer] has patients information for in county transportation and if needed for out of county transportation, they will have to call [place] line at [phone] as patient does not have an transportation benefit. All rides must be called 3 days in advance. Family understood. This information has been e-mailed to daughter as well.  Follow Up Plan:  No further follow up planned at this time. The patient has been provided with needed resources.  [place] management  [place], [place]  Main Phone: [phone]  E-mail: [email or web address]  Website: [email or web address]

## 2022-05-19 DIAGNOSIS — L03115 Cellulitis of right lower limb: Secondary | ICD-10-CM | POA: Diagnosis not present

## 2022-05-19 LAB — CBC
HCT: 37.4 % — ABNORMAL LOW (ref 39.0–52.0)
Hemoglobin: 12.5 g/dL — ABNORMAL LOW (ref 13.0–17.0)
MCH: 31.8 pg (ref 26.0–34.0)
MCHC: 33.4 g/dL (ref 30.0–36.0)
MCV: 95.2 fL (ref 80.0–100.0)
Platelets: 312 10*3/uL (ref 150–400)
RBC: 3.93 MIL/uL — ABNORMAL LOW (ref 4.22–5.81)
RDW: 15.5 % (ref 11.5–15.5)
WBC: 15.8 10*3/uL — ABNORMAL HIGH (ref 4.0–10.5)
nRBC: 0 % (ref 0.0–0.2)

## 2022-05-19 LAB — BASIC METABOLIC PANEL
Anion gap: 9 (ref 5–15)
BUN: 26 mg/dL — ABNORMAL HIGH (ref 8–23)
CO2: 23 mmol/L (ref 22–32)
Calcium: 8.4 mg/dL — ABNORMAL LOW (ref 8.9–10.3)
Chloride: 104 mmol/L (ref 98–111)
Creatinine, Ser: 1.58 mg/dL — ABNORMAL HIGH (ref 0.61–1.24)
GFR, Estimated: 44 mL/min — ABNORMAL LOW (ref >60)
Glucose, Bld: 94 mg/dL (ref 70–99)
Potassium: 3.1 mmol/L — ABNORMAL LOW (ref 3.5–5.1)
Sodium: 136 mmol/L (ref 135–145)

## 2022-05-19 MED ORDER — MIDODRINE HCL 5 MG PO TABS
10.0000 mg | ORAL_TABLET | Freq: Three times a day (TID) | ORAL | Status: DC
  Administered 2022-05-19: 10 mg via ORAL
  Filled 2022-05-19: qty 2

## 2022-05-19 MED ORDER — ENSURE ENLIVE PO LIQD
237.0000 mL | Freq: Three times a day (TID) | ORAL | Status: DC
  Administered 2022-05-19 – 2022-05-27 (×11): 237 mL via ORAL

## 2022-05-19 MED ORDER — POTASSIUM CHLORIDE CRYS ER 20 MEQ PO TBCR
20.0000 meq | EXTENDED_RELEASE_TABLET | Freq: Four times a day (QID) | ORAL | Status: AC
  Administered 2022-05-19: 20 meq via ORAL
  Filled 2022-05-19 (×2): qty 1

## 2022-05-19 MED ORDER — FUROSEMIDE 40 MG PO TABS
40.0000 mg | ORAL_TABLET | Freq: Once | ORAL | Status: AC
  Administered 2022-05-19: 40 mg via ORAL
  Filled 2022-05-19: qty 1

## 2022-05-19 MED ORDER — POTASSIUM CHLORIDE 10 MEQ/100ML IV SOLN
10.0000 meq | INTRAVENOUS | Status: AC
  Administered 2022-05-19 – 2022-05-20 (×3): 10 meq via INTRAVENOUS
  Filled 2022-05-19 (×3): qty 100

## 2022-05-19 MED ORDER — ADULT MULTIVITAMIN W/MINERALS CH
1.0000 | ORAL_TABLET | Freq: Every day | ORAL | Status: DC
  Administered 2022-05-19 – 2022-05-27 (×8): 1 via ORAL
  Filled 2022-05-19 (×8): qty 1

## 2022-05-19 MED ORDER — AMIODARONE IV BOLUS ONLY 150 MG/100ML
150.0000 mg | Freq: Once | INTRAVENOUS | Status: AC
  Administered 2022-05-19: 150 mg via INTRAVENOUS
  Filled 2022-05-19: qty 100

## 2022-05-19 MED ORDER — AMIODARONE HCL IN DEXTROSE 360-4.14 MG/200ML-% IV SOLN
INTRAVENOUS | Status: AC
  Filled 2022-05-19: qty 200

## 2022-05-19 MED ORDER — ALBUMIN HUMAN 25 % IV SOLN
50.0000 g | Freq: Once | INTRAVENOUS | Status: AC
  Administered 2022-05-20: 50 g via INTRAVENOUS
  Filled 2022-05-19: qty 200

## 2022-05-19 NOTE — Significant Event (Signed)
Rapid Response Event Note   Reason for Call :  Hypotensive and fatigued  Initial Focused Assessment:  Patient discharged from CABG surgery on 8/4 and readmitted with cellulitis on his right thigh. Patient developed paroxysmal afib post op.  Patient was given a bolus of amiodarone today at 1710 which has helped with his heartrate but his BP continues to be low. He was started on 10 mg Midodrine TID. The first dose of midodrine was given at 1653. His pressure still continues to be low.   He also reports that he is still feeling fatigued. He denies NV, CP, blurred vision. He is currently on room air with oxygen saturations in the high 90's. He is able to speak mostly full sentences and would occasionally have to stop talking for a breath of air.   BP 84/50 manual  MD Notified:  Call Time:1600 Arrival Time: 6606 End Time: [place], RN

## 2022-05-19 NOTE — Significant Event (Addendum)
Rapid Response Event Note   Reason for Call :  CP, hypotension  Initial Focused Assessment:  On arrival, pt sitting in bed c/o pain to chest/upper abd of 8/10. He states it is aching and constant.  HR 75, BP 114/61, RR 25, spO2 94% on RA.  After further assessment, pt pain appeared to be mostly abdominal. Pt stated he has not had a BM in awhile. HR increased to 120 with frequent PVCs around 2215. Md made aware by primary RN.   Interventions:  109m oxy given PTA Albumin ordered for hypotension EKG IV potassium Pt adjusted in bed for comfort  Plan of Care:  Continue to monitor pt vitals and pain control.   RN instructed to call with any changes or concerns. Will follow up after pain meds have a chance to start working.    Event Summary:   MD Notified: Dr BCyndia Bent PTA Call Time: [id number](on unit seeing other pt) Arrival Time: 2201 End Time: 26004 0600: RN called stating pt with increased WOB, wheezing and tachycardia. Dr BCyndia Bentcalled, ordered to transfer to ICU, CXR, KUB, ABG. Pt transferred to 2H14 at [place] RN

## 2022-05-19 NOTE — Progress Notes (Signed)
Informed by telemetry that Pt had a run of Vtach. Pt symptomatic. CTSX Dr Cyndia Bent aware. No new orders. Keep monitoring. Call bell placed within reach. Will continue to monitor and maintain safety.

## 2022-05-19 NOTE — Progress Notes (Signed)
Post 135m Luis Deleon VS: 78 Afib, 98/73 (81). Pt states he feels tired but the chest discomfort has subsided at this time.

## 2022-05-19 NOTE — Progress Notes (Addendum)
RN called Dr Cyndia Bent in regards to patient being in [facility] with rates from 120-145. Pt is symptomatic complaining of chest discomfort and lightheadedness. RN received verbal orders to give 117m IV Amio Bolus and if rate doesn't reduce to repeat amio bolus 1566mIV one hour later.

## 2022-05-19 NOTE — Progress Notes (Signed)
Initial Nutrition Assessment  DOCUMENTATION CODES:   Obesity unspecified  INTERVENTION:   -Liberalize diet to 2 gram sodium for wider variety of meal selections -Ensure Enlive po TID, each supplement provides 350 kcal and 20 grams of protein -MVI with minerals daily  NUTRITION DIAGNOSIS:   Inadequate oral intake related to decreased appetite as evidenced by meal completion < 25%.  GOAL:   Patient will meet greater than or equal to 90% of their needs  MONITOR:   PO intake, Supplement acceptance  REASON FOR ASSESSMENT:   Malnutrition Screening Tool    ASSESSMENT:   Pt with a past history of hypertension, dyslipidemia, Crohn's disease, prostate cancer status post radical prostatectomy, stage III chronic kidney disease, and remote history of tobacco abuse.  He was recently discovered to have coronary artery disease and underwent CABG x3 by Dr. Roxan Hockey on 05/07/2022.  Pt admitted with hypotension and weakness (likely related to over-medication) and cellulitis of rt thigh.   7/24- s/p CABG  Reviewed I/O's: +1.8 L x 24 hours  UOP: 500 ml x 24 hours  Pt unavailable at time of visit. Attempted to speak with pt via call to hospital room phone, however, unable to reach. RD unable to obtain further nutrition-related history or complete nutrition-focused physical exam at this time.     Pt is currently on a heart healthy diet. Meal completions documented at 0%. Per H&P, pt had a fair appetite PTA.   Reviewed wt hx;pt has experienced a 2.3% wt loss over the past 6 months, which is not significant for time frame. Pt with generalized edema, which may be masking true weight loss as well as fat and muscle depletions.  Medications reviewed and include colace, lasix, and potassium chloride.  Labs reviewed: K: 3.1, CBGS: 73 (inpatient orders for glycemic control are none).    Diet Order:   Diet Order             Diet Heart Room service appropriate? Yes; Fluid consistency: Thin   Diet effective now                   EDUCATION NEEDS:   No education needs have been identified at this time  Skin:  Skin Assessment: Skin Integrity Issues: Skin Integrity Issues:: Incisions Incisions: closed chest, closed rt leg  Last BM:  05/16/22  Height:   Ht Readings from Last 1 Encounters:  06/12/2022 5' 8"  (1.727 m)    Weight:   Wt Readings from Last 1 Encounters:  05/19/22 105 kg    Ideal Body Weight:  70 kg  BMI:  Body mass index is 35.2 kg/m.  Estimated Nutritional Needs:   Kcal:  1900-2100  Protein:  105-120 grams  Fluid:  > 1.9 L    Loistine Chance, RD, LDN, [place] Registered Dietitian II Certified Diabetes Care and Education Specialist Please refer to [facility] for RD and/or RD on-call/weekend/after hours pager

## 2022-05-19 NOTE — Progress Notes (Addendum)
[address]       [place] [address]             [phone]         Subjective: Resting in bed.  No new complaints or concerns.  Says he continues to feel short of breath, especially with talking.  Objective: Vital signs in last 24 hours: Temp:  [97.6 F (36.4 C)-98 F (36.7 C)] 97.7 F (36.5 C) (08/05 0824) Pulse Rate:  [56-94] 75 (08/05 0824) Cardiac Rhythm: Normal sinus rhythm (08/05 0700) Resp:  [15-24] 16 (08/05 0824) BP: (65-142)/(40-104) 93/56 (08/05 0824) SpO2:  [93 %-100 %] 96 % (08/05 0824) Weight:  [95.5 kg-105 kg] 105 kg (08/05 0600)    Intake/Output from previous day: 08/04 0701 - 08/05 0700 In: 2300 [P.O.:250; IV Piggyback:2050] Out: 550 [Urine:550] Intake/Output this shift: No intake/output data recorded.  General appearance: alert, cooperative, and mild distress Neurologic: intact Heart: In atrial fibrillation with ventricular rate around 90-110 Lungs: Breath sounds are clear, diminished in the bases.  As clear, nonproductive cough Abdomen: Soft, no tenderness Extremities: Induration in the right mid thigh is about the same, erythema is diminished a little since last evening.  Area remains tender.  No drainage or fluctuance Wound: The right forearm induration is unchanged.  No inflammation.  Lab Results: Recent Labs    06/11/2022 1653 05/19/22 0045  WBC 16.8* 15.8*  HGB 12.2* 12.5*  HCT 36.7* 37.4*  PLT 296 312   BMET:  Recent Labs    05/23/2022 1222 05/17/2022 1653 05/19/22 0045  NA 136  --  136  K 3.4*  --  3.1*  CL 100  --  104  CO2 27  --  23  GLUCOSE 121*  --  94  BUN 28*  --  26*  CREATININE 2.06* 1.81* 1.58*  CALCIUM 8.7*  --  8.4*    PT/INR: No results for input(s): "LABPROT", "INR" in the last 72 hours. ABG    Component Value Date/Time   PHART 7.358 05/08/2022 0308   HCO3 25.2 05/08/2022 0308   TCO2 26 05/08/2022 0308   ACIDBASEDEF 6.0 (H) 05/07/2022 2302   O2SAT 97 05/08/2022 0308   CBG (last 3)   No results for input(s): "GLUCAP" in the last 72 hours.  Assessment/Plan:  -POD12-CABG.  Discharged on 8/4 with readmission yesterday for cellulitis in the right thigh.  On vancomycin.  Erythema has improved minimally.  White count is decreasing.  Blood cultures negative at less than 24 hours.  He does not need to be confined to bed, encouraged ambulation in the hall.  Reorder incentive spirometry.  -Paroxysmal atrial fibrillation-this was present postoperatively.  He was discharged on amiodarone and Eliquis which have been resumed.  Rate control appears to be adequate.  We will supplement potassium to keep above 4.  -Pulm-having some shortness of breath this morning, Sat 96% on RA.  Suspect he is volume overloaded after receiving 3 L of IV fluid in the ED yesterday.  Doubt the accuracy of the documented weight showing 10 kg increase since arriving in the ED yesterday.  Needs diuresis but blood pressure is relatively low.  We will add midodrine repeat Lasix this afternoon.  -DVT prophylaxis-he is anticoagulated with Eliquis.   LOS: 1 day    Antony Odea, PA-C 05/19/2022   Chart reviewed, patient examined, agree with above. Right leg cellulitis looks stable and WBC decreasing.  Some SOB. CXR on admission showed small left pleural  effusion and mild left base atelectasis but overall looked ok. Will start diuresis as BP allows. Creat coming back down.

## 2022-05-20 ENCOUNTER — Inpatient Hospital Stay (HOSPITAL_COMMUNITY): Payer: Medicare HMO

## 2022-05-20 ENCOUNTER — Inpatient Hospital Stay: Payer: Self-pay

## 2022-05-20 DIAGNOSIS — L03115 Cellulitis of right lower limb: Secondary | ICD-10-CM | POA: Diagnosis not present

## 2022-05-20 LAB — CBC
HCT: 39.2 % (ref 39.0–52.0)
HCT: 33.5 % — ABNORMAL LOW (ref 39.0–52.0)
Hemoglobin: 13.2 g/dL (ref 13.0–17.0)
Hemoglobin: 11.6 g/dL — ABNORMAL LOW (ref 13.0–17.0)
MCH: 32.5 pg (ref 26.0–34.0)
MCH: 32.7 pg (ref 26.0–34.0)
MCHC: 33.7 g/dL (ref 30.0–36.0)
MCHC: 34.6 g/dL (ref 30.0–36.0)
MCV: 96.6 fL (ref 80.0–100.0)
MCV: 94.4 fL (ref 80.0–100.0)
Platelets: 328 10*3/uL (ref 150–400)
Platelets: 282 10*3/uL (ref 150–400)
RBC: 4.06 MIL/uL — ABNORMAL LOW (ref 4.22–5.81)
RBC: 3.55 MIL/uL — ABNORMAL LOW (ref 4.22–5.81)
RDW: 15.9 % — ABNORMAL HIGH (ref 11.5–15.5)
RDW: 15.8 % — ABNORMAL HIGH (ref 11.5–15.5)
WBC: 22.4 10*3/uL — ABNORMAL HIGH (ref 4.0–10.5)
WBC: 24.6 10*3/uL — ABNORMAL HIGH (ref 4.0–10.5)
nRBC: 0 % (ref 0.0–0.2)
nRBC: 0 % (ref 0.0–0.2)

## 2022-05-20 LAB — POCT I-STAT 7, (LYTES, BLD GAS, ICA,H+H)
Acid-Base Excess: 1 mmol/L (ref 0.0–2.0)
Bicarbonate: 23.9 mmol/L (ref 20.0–28.0)
Calcium, Ion: 1.22 mmol/L (ref 1.15–1.40)
HCT: 36 % — ABNORMAL LOW (ref 39.0–52.0)
Hemoglobin: 12.2 g/dL — ABNORMAL LOW (ref 13.0–17.0)
O2 Saturation: 95 %
Patient temperature: 99.3
Potassium: 3.4 mmol/L — ABNORMAL LOW (ref 3.5–5.1)
Sodium: 139 mmol/L (ref 135–145)
TCO2: 25 mmol/L (ref 22–32)
pCO2 arterial: 32.4 mmHg (ref 32–48)
pH, Arterial: 7.478 — ABNORMAL HIGH (ref 7.35–7.45)
pO2, Arterial: 71 mmHg — ABNORMAL LOW (ref 83–108)

## 2022-05-20 LAB — BASIC METABOLIC PANEL
Anion gap: 11 (ref 5–15)
Anion gap: 10 (ref 5–15)
BUN: 25 mg/dL — ABNORMAL HIGH (ref 8–23)
BUN: 19 mg/dL (ref 8–23)
CO2: 23 mmol/L (ref 22–32)
CO2: 25 mmol/L (ref 22–32)
Calcium: 8.9 mg/dL (ref 8.9–10.3)
Calcium: 8.6 mg/dL — ABNORMAL LOW (ref 8.9–10.3)
Chloride: 104 mmol/L (ref 98–111)
Chloride: 103 mmol/L (ref 98–111)
Creatinine, Ser: 1.66 mg/dL — ABNORMAL HIGH (ref 0.61–1.24)
Creatinine, Ser: 1.27 mg/dL — ABNORMAL HIGH (ref 0.61–1.24)
GFR, Estimated: 42 mL/min — ABNORMAL LOW (ref >60)
GFR, Estimated: 58 mL/min — ABNORMAL LOW (ref >60)
Glucose, Bld: 129 mg/dL — ABNORMAL HIGH (ref 70–99)
Glucose, Bld: 221 mg/dL — ABNORMAL HIGH (ref 70–99)
Potassium: 3.5 mmol/L (ref 3.5–5.1)
Potassium: 3.2 mmol/L — ABNORMAL LOW (ref 3.5–5.1)
Sodium: 138 mmol/L (ref 135–145)
Sodium: 138 mmol/L (ref 135–145)

## 2022-05-20 LAB — GLUCOSE, CAPILLARY
Glucose-Capillary: 123 mg/dL — ABNORMAL HIGH (ref 70–99)
Glucose-Capillary: 149 mg/dL — ABNORMAL HIGH (ref 70–99)
Glucose-Capillary: 152 mg/dL — ABNORMAL HIGH (ref 70–99)
Glucose-Capillary: 156 mg/dL — ABNORMAL HIGH (ref 70–99)

## 2022-05-20 MED ORDER — DEXTROSE-NACL 5-0.9 % IV SOLN: INTRAVENOUS | Status: DC

## 2022-05-20 MED ORDER — SODIUM CHLORIDE 0.9% FLUSH
10.0000 mL | Freq: Two times a day (BID) | INTRAVENOUS | Status: DC
  Administered 2022-05-20 – 2022-05-22 (×5): 10 mL
  Administered 2022-05-23: 20 mL
  Administered 2022-05-24 – 2022-05-26 (×6): 10 mL
  Administered 2022-05-27: 20 mL
  Administered 2022-05-27: 10 mL

## 2022-05-20 MED ORDER — SODIUM CHLORIDE 0.9% FLUSH: 10.0000 mL | INTRAVENOUS | Status: DC | PRN

## 2022-05-20 MED ORDER — CHLORHEXIDINE GLUCONATE CLOTH 2 % EX PADS
6.0000 | MEDICATED_PAD | Freq: Every day | CUTANEOUS | Status: DC
  Administered 2022-05-20 – 2022-05-27 (×8): 6 via TOPICAL

## 2022-05-20 MED ORDER — LEVALBUTEROL HCL 0.63 MG/3ML IN NEBU
0.6300 mg | INHALATION_SOLUTION | Freq: Once | RESPIRATORY_TRACT | Status: DC
  Filled 2022-05-20: qty 3

## 2022-05-20 MED ORDER — VANCOMYCIN HCL 750 MG/150ML IV SOLN
750.0000 mg | INTRAVENOUS | Status: AC
  Administered 2022-05-20 – 2022-05-24 (×5): 750 mg via INTRAVENOUS
  Filled 2022-05-20 (×6): qty 150

## 2022-05-20 MED ORDER — POTASSIUM CHLORIDE 10 MEQ/50ML IV SOLN
10.0000 meq | INTRAVENOUS | Status: AC
  Administered 2022-05-20 (×3): 10 meq via INTRAVENOUS
  Filled 2022-05-20 (×3): qty 50

## 2022-05-20 MED ORDER — AMIODARONE HCL IN DEXTROSE 360-4.14 MG/200ML-% IV SOLN
30.0000 mg/h | INTRAVENOUS | Status: DC
  Administered 2022-05-20 – 2022-05-22 (×7): 30 mg/h via INTRAVENOUS
  Filled 2022-05-20 (×6): qty 200

## 2022-05-20 MED ORDER — PIPERACILLIN-TAZOBACTAM 3.375 G IVPB
3.3750 g | Freq: Three times a day (TID) | INTRAVENOUS | Status: AC
Start: 2022-05-20 — End: 2022-05-26
  Administered 2022-05-20 – 2022-05-26 (×21): 3.375 g via INTRAVENOUS
  Filled 2022-05-20 (×20): qty 50

## 2022-05-20 MED ORDER — AMIODARONE IV BOLUS ONLY 150 MG/100ML
150.0000 mg | Freq: Once | INTRAVENOUS | Status: AC
  Administered 2022-05-20: 150 mg via INTRAVENOUS

## 2022-05-20 NOTE — Progress Notes (Signed)
TCTS    Subjective:  He developed some mid to upper abdominal pain overnight which he says has been severe at times. Has not had BM since admission. Says he is passing flatus. No nausea or vomiting.   He has also had recurrent rapid atrial fib in the 120's. Has not wanted to take any of his po meds.   I transferred him to the ICU this am for closer observation. His Tmax is 99.3 this am. BP has been ok 115-130's. Sats 96% on 4L. UO good. CXR this am shows small left effusion and mild left base atelectasis unchanged. Lungs otherwise clear. No air under diaphragm. KUB unremarkable with air throughout intestine, no sign of bowel obstruction. ABG ok with pH 7.48, BE +1. CO2 32, O2 71, 95%. WBC did increase to 22.4 from 15.8 yesterday am. Hgb stable BC negative from admission. UA ok I think.   He had a CT of the chest, abdomen and pelvis on admission 2 days ago that was unremarkable.   Objective: Vital signs in last 24 hours: Temp:  [97.4 F (36.3 C)-99.3 F (37.4 C)] 99.3 F (37.4 C) (08/06 0642) Pulse Rate:  [74-124] 93 (08/06 0715) Cardiac Rhythm: Normal sinus rhythm;Sinus tachycardia (08/06 0645) Resp:  [12-33] 23 (08/06 0715) BP: (84-149)/(49-93) 124/61 (08/06 0715) SpO2:  [94 %-98 %] 95 % (08/06 0715)  Hemodynamic parameters for last 24 hours:    Intake/Output from previous day: 08/05 0701 - 08/06 0700 In: 289.8 [IV Piggyback:289.8] Out: 1500 [Urine:1500] Intake/Output this shift: No intake/output data recorded.  General appearance: alert and cooperative Neurologic: intact Heart: irregularly irregular rhythm Lungs: clear to auscultation bilaterally Abdomen: soft, non-tender; bowel sounds normal, not distended. Extremities: edema mild, no mottling  Wound: chest incision ok, leg wound cellulitis stable. It is still purplish red, mildly tender and swollen along the vein harvest tunnel in right leg mid thigh.  Lab Results: Recent Labs    05/19/22 0045 05/20/22 0056   WBC 15.8* 22.4*  HGB 12.5* 13.2  HCT 37.4* 39.2  PLT 312 328   BMET:  Recent Labs    05/19/22 0045 05/20/22 0056  NA 136 138  K 3.1* 3.5  CL 104 104  CO2 23 23  GLUCOSE 94 129*  BUN 26* 25*  CREATININE 1.58* 1.66*  CALCIUM 8.4* 8.9    PT/INR: No results for input(s): "LABPROT", "INR" in the last 72 hours. ABG    Component Value Date/Time   PHART 7.358 05/08/2022 0308   HCO3 25.2 05/08/2022 0308   TCO2 26 05/08/2022 0308   ACIDBASEDEF 6.0 (H) 05/07/2022 2302   O2SAT 97 05/08/2022 0308   CBG (last 3)  No results for input(s): "GLUCAP" in the last 72 hours.  Assessment/Plan:  13 days post CABG.  Admitted with hypotension, generalized weakness and right leg wound cellulitis along vein harvest tunnel.   Has developed some abdominal pain overnight of unclear etiology but seems better this am. This could be gas pain. I doubt gut ischemia with no acidosis, no free air, unremarkable KUB but will continue close observation. Keep NPO for now.  Atrial fib with RVR: will give prn boluses of IV amio and start drip at 30. Replace K+ as needed.  Leukcytosis that improved from from 19 on admission to 15.8 yesterday am but up to 22.4 this am. No fever yet but 99 this am. Will broaden antibiotic coverage and add Zosyn. May be related to right leg cellulitis. May require drainage.  Have PICC line inserted for  access.  CKD: creat stable but higher than his normal range of 1.1-1.3. He peaked at 2 during postop period. Will give some IVF while NPO.       LOS: 2 days    Gaye Pollack 05/20/2022

## 2022-05-20 NOTE — Progress Notes (Signed)
Peripherally Inserted Central Catheter Placement  The IV Nurse has discussed with the patient and/or persons authorized to consent for the patient, the purpose of this procedure and the potential benefits and risks involved with this procedure.  The benefits include less needle sticks, lab draws from the catheter, and the patient may be discharged home with the catheter. Risks include, but not limited to, infection, bleeding, blood clot (thrombus formation), and puncture of an artery; nerve damage and irregular heartbeat and possibility to perform a PICC exchange if needed/ordered by physician.  Alternatives to this procedure were also discussed.  B[employer] Power PICC patient education guide, fact sheet on infection prevention and patient information card has been provided to patient /or left at bedside.    PICC Placement Documentation  PICC Double Lumen 05/20/22 Right Brachial 41 cm 0 cm (Active)  Indication for Insertion or Continuance of Line Vasoactive infusions 05/20/22 0946  Exposed Catheter (cm) 0 cm 05/20/22 0946  Site Assessment Clean, Dry, Intact 05/20/22 0946  Lumen #1 Status Flushed;Saline locked;Blood return noted 05/20/22 0946  Lumen #2 Status Flushed;Saline locked;Blood return noted 05/20/22 0946  Dressing Type Transparent;Securing device 05/20/22 0946  Dressing Status Antimicrobial disc in place;Clean, Dry, Intact 05/20/22 0946  Safety Lock Not Applicable 79/39/68 8648  Line Care Connections checked and tightened 05/20/22 0946  Line Adjustment (NICU/IV Team Only) No 05/20/22 0946  Dressing Intervention New dressing 05/20/22 0946  Dressing Change Due 05/27/22 05/20/22 0946       Rolena Infante 05/20/2022, 9:47 AM

## 2022-05-20 NOTE — Progress Notes (Addendum)
Pharmacy Antibiotic Note  Luis Deleon is a 81 y.o. male admitted on 05/30/2022 with cellulitis.  PT presented with shortness of breath, s/p recent CABG, and concern for worsening of cellulitis on PTA prescription for Augmentin. WBC trending up this AM to 22.4 and pt has not significantly improved. Pharmacy has been consulted for vancomycin and Zosyn dosing. Creatinine clearance has improved since initial dosing. Will redose vancomycin. Pt received 1750 mg x1 on 8/4Current dosing would give eAUC of 351 after improved CrCl. Currently afebrile.  Plan: Start Zosyn 3.375 g every 8 hours  Will change vancomycin to 750 mg every 24 hours for [facility] of 422 using Cr of 1.66, redosed now Will adjust based on renal function, clinical progress, and cultures  Height: 5' 8"  (172.7 cm) Weight: 105 kg (231 lb 7.7 oz) IBW/kg (Calculated) : 68.4  Temp (24hrs), Avg:98 F (36.7 C), Min:97.4 F (36.3 C), Max:99.3 F (37.4 C)  Recent Labs  Lab 05/17/22 0229 06/09/2022 1222 06/02/2022 1328 05/25/2022 1441 06/12/2022 1653 05/19/22 0045 05/20/22 0056  WBC 16.6* 19.3*  --   --  16.8* 15.8* 22.4*  CREATININE  --  2.06*  --   --  1.81* 1.58* 1.66*  LATICACIDVEN  --   --  2.0* 2.4*  --   --   --     Estimated Creatinine Clearance: 43.1 mL/min (A) (by C-G formula based on SCr of 1.66 mg/dL (H)).    Allergies  Allergen Reactions   Colchicine Diarrhea, Nausea And Vomiting and Other (See Comments)    REACTION: nausea, diarrhea and interactions with 67m   Protamine Other (See Comments)    Likely mild protamine reaction with hypotension during CABG 2023    Antimicrobials this admission: Vanc 8/4 >>  Zosyn 8/6 >>  Ancef 8/4  Dose adjustments this admission: Vanc 1250 mg q48h to 750 mg q24h  Microbiology results: 8/4 BCx: NGTD  Thank you for allowing pharmacy to participate in this patient's care.  NReatha Harps PharmD PGY2 Pharmacy Resident 05/20/2022 10:15 AM Check [email or web address] for unit specific pharmacy  number

## 2022-05-20 NOTE — Progress Notes (Signed)
ABG sample obtained x1 attempt. Results given to Dr. Cyndia Bent. No new orders received at this time. RT will continue to monitor and be available as needed.

## 2022-05-20 NOTE — Progress Notes (Addendum)
At bedside to place PICC line per order.  Pt oriented, but not all conversation appropriate.  Asking multiple times what time it is.  Pt agreeable to procedure if dtr, [name] is.   Multiple attempts to call [name] and wife without answer.  RN also attempted multiple times.  Dr Cyndia Bent on unit speaks with pt at bedside and give Medical Necessity to place the PICC line at this time.  Katelyn RN , Tanya RN VAST and this RN heard verbal order for medical necessity.  Pt agreeable verbally and implied with proceeding with PICC

## 2022-05-20 NOTE — Progress Notes (Signed)
Patient ID: Luis Deleon, male   DOB: 1941-07-15, 81 y.o.   MRN: [id number]  TCTS Evening Rounds:  Hemodynamically stable today in sinus rhythm on IV amio.  UO good. K+ 3.2 and repleting. Creat down to 1.27  He slept a lot today and feels much better. Minimal pain in right abdomen. Says it feels like he pulled a muscle. He is passing flatus.  Abd exam is benign with active BS. Will resume clear liquids.  WBC up to 24.6 this pm. Tmax 100.3.

## 2022-05-20 NOTE — Hospital Course (Addendum)
History of Present Illness:    Luis Deleon is a 81 year old gentleman with a past history of hypertension, dyslipidemia, Crohn's disease, prostate cancer status post radical prostatectomy, stage III chronic kidney disease, and remote history of tobacco abuse.  He was recently discovered to have coronary artery disease and underwent CABG x3 by Dr. Roxan Hockey on 05/07/2022.  His postoperative course was significant for atrial fibrillation that was treated with amiodarone and initially converted to normal sinus rhythm.  He went on to have a few more episodes of atrial fibrillation.  After consultation with cardiology, the decision was made to start him on Eliquis.  He otherwise made a progressive and satisfactory recovery.  He developed a cellulitis in his right arm apparently associated with an IV infiltration.  This was initially treated with Keflex.  By the time of discharge, he continued to have the induration and was noted to have slight increase in his white blood count.  It was decided to transition to oral Augmentin prior to discharge. Home health nursing was arranged prior to discharge and on the first nurse visit this morning, the patient was noted to have systolic blood pressure in the 60s and heart rate in the high 50s.  He felt poorly in general with weakness and shortness of breath.  He was advised to come to the emergency room.  The patient's daughter, on further inquiry, reported that she had given him all of his usual home medications this morning which included the Dilaudid, losartan, and hydrochlorothiazide which had been discontinued on the discharge instructions. Work-up in the emergency room has included CBC showing white blood count increasing further to 19,000, lactic acid of 2.0, and creatinine 2.0.  Chest x-ray shows expected postoperative changes with no significant pleural effusion or infiltrates.  CT scan of the abdomen and pelvis to rule out exacerbation of his Crohn's disease  was unrevealing.  Blood cultures were obtained with results pending at this time. He denies having any fever since his discharge home yesterday.  He said he was feeling well, was walking around, and was eating okay until after taking his medications this morning. Since admission to the emergency room this morning, he has received 2 L of IV fluid.  His blood pressure has improved to around 100/60.  Heart rate is 63.  He is awake and comfortable resting on the ED stretcher.  He reports having some chest soreness but denies any chest pain.  He also denies any pain in his arm at the site of IV infiltration.  He does report pain in his right thigh over the vein harvest tunnel.  He said he first noticed this while in the hospital that it has become more intense since his discharge yesterday morning.  Hospital Course: Luis Deleon was admitted to [facility].  He was started on empiric IV vancomycin along with his discharge medications for atrial fibrillation.  By the second hospital day, his white blood count had decreased from 19,000-15,000.  The erythema over the right medial thigh was gradually improving but the induration persisted.  On 05/20/2022, he complained of abdominal discomfort which he described as severe at times.  He was in atrial fibrillation with a rate in the 120s at that time.  He was transferred to the ICU for closer observation.  Chest x-ray showed small left pleural effusion with mild left basilar atelectasis which was essentially unchanged.  A KUB was obtained that showed air throughout the intestine with no evidence of obstruction.  ABG was unremarkable with the base dose of 1.  His white blood count had increased to 22,000.  Blood cultures from admission remained negative.  Antibiotic coverage was with the addition of Zosyn to vancomycin.  He was kept NPO and started on IV fluids.  His abdominal pain improved but did not resolve.  General surgery consult was obtained repeat CT scan which  showed gallstone with cholecystitis.  They recommended IR placement of percutaneous chole tube with cholecystectomy 6-8 weeks down the road.  He required transfusion of FFP prior to procedure due to elevated INR of 2.1.  He was transitioned off IV amiodarone on 8/9.  His diet was advanced as tolerated.  He was hypotensive and started on Neo-synephrine for BP support.  He required initiation of Midodrine for BP support.  He has completed a 7 day course of IV antibiotics and will receive an additional 7 days of oral antibiotics.

## 2022-05-21 ENCOUNTER — Inpatient Hospital Stay (HOSPITAL_COMMUNITY): Payer: Medicare HMO

## 2022-05-21 DIAGNOSIS — L03115 Cellulitis of right lower limb: Secondary | ICD-10-CM | POA: Diagnosis not present

## 2022-05-21 LAB — CBC
HCT: 35 % — ABNORMAL LOW (ref 39.0–52.0)
Hemoglobin: 11.6 g/dL — ABNORMAL LOW (ref 13.0–17.0)
MCH: 32.3 pg (ref 26.0–34.0)
MCHC: 33.1 g/dL (ref 30.0–36.0)
MCV: 97.5 fL (ref 80.0–100.0)
Platelets: 267 10*3/uL (ref 150–400)
RBC: 3.59 MIL/uL — ABNORMAL LOW (ref 4.22–5.81)
RDW: 16.1 % — ABNORMAL HIGH (ref 11.5–15.5)
WBC: 26.9 10*3/uL — ABNORMAL HIGH (ref 4.0–10.5)
nRBC: 0 % (ref 0.0–0.2)

## 2022-05-21 LAB — COMPREHENSIVE METABOLIC PANEL
ALT: 61 U/L — ABNORMAL HIGH (ref 0–44)
AST: 106 U/L — ABNORMAL HIGH (ref 15–41)
Albumin: 2.4 g/dL — ABNORMAL LOW (ref 3.5–5.0)
Alkaline Phosphatase: 104 U/L (ref 38–126)
Anion gap: 8 (ref 5–15)
BUN: 15 mg/dL (ref 8–23)
CO2: 24 mmol/L (ref 22–32)
Calcium: 8.4 mg/dL — ABNORMAL LOW (ref 8.9–10.3)
Chloride: 105 mmol/L (ref 98–111)
Creatinine, Ser: 1.23 mg/dL (ref 0.61–1.24)
GFR, Estimated: >60 mL/min (ref >60)
Glucose, Bld: 258 mg/dL — ABNORMAL HIGH (ref 70–99)
Potassium: 3.1 mmol/L — ABNORMAL LOW (ref 3.5–5.1)
Sodium: 137 mmol/L (ref 135–145)
Total Bilirubin: 4.1 mg/dL — ABNORMAL HIGH (ref 0.3–1.2)
Total Protein: 5.7 g/dL — ABNORMAL LOW (ref 6.5–8.1)

## 2022-05-21 LAB — GLUCOSE, CAPILLARY
Glucose-Capillary: 245 mg/dL — ABNORMAL HIGH (ref 70–99)
Glucose-Capillary: 138 mg/dL — ABNORMAL HIGH (ref 70–99)
Glucose-Capillary: 125 mg/dL — ABNORMAL HIGH (ref 70–99)
Glucose-Capillary: 107 mg/dL — ABNORMAL HIGH (ref 70–99)
Glucose-Capillary: 108 mg/dL — ABNORMAL HIGH (ref 70–99)

## 2022-05-21 LAB — LACTIC ACID, PLASMA
Lactic Acid, Venous: 1.4 mmol/L (ref 0.5–1.9)
Lactic Acid, Venous: 1.5 mmol/L (ref 0.5–1.9)

## 2022-05-21 MED ORDER — IOHEXOL 9 MG/ML PO SOLN
ORAL | Status: AC
  Administered 2022-05-21: 500 mL
  Filled 2022-05-21: qty 1000

## 2022-05-21 MED ORDER — IOHEXOL 350 MG/ML SOLN
100.0000 mL | Freq: Once | INTRAVENOUS | Status: AC | PRN
  Administered 2022-05-21: 100 mL via INTRAVENOUS

## 2022-05-21 MED ORDER — POTASSIUM CHLORIDE 10 MEQ/50ML IV SOLN
10.0000 meq | INTRAVENOUS | Status: AC
  Administered 2022-05-21 (×3): 10 meq via INTRAVENOUS
  Filled 2022-05-21 (×3): qty 50

## 2022-05-21 MED ORDER — POTASSIUM CHLORIDE IN NACL 20-0.45 MEQ/L-% IV SOLN
INTRAVENOUS | Status: DC
  Filled 2022-05-21 (×6): qty 1000

## 2022-05-21 NOTE — H&P (Addendum)
Luis Deleon 12-Feb-1941  [id number].    Requesting MD: Roxan Hockey, MD Chief Complaint/Reason for Consult: abdominal pain, leukocytosis following CABG, PMH Crohns  HPI:   Luis Deleon is a 81 y/o M with MMP including PMH crohns disease and multiple abdominal surgeries, as well as recent CABGx3 05/07/22 by Dr. Roxan Hockey, who presented to the ED with a cc abdominal pain and hypotension noted by [facility] RN. Patient tells me that he had severe, non-radiating, right-sided, abdominal pain Saturday night, thought it was gas. States he was up all night in severe pain and by the time he came in to the ED it was improving. He feels this pain is different than his Crohn's flares. He tells me that he is technically in remission from Crohn's but gets what he calls flares every 5-6 months where he has 8-10 hours of pain that then resolves. Denies nausea, vomiting, diarrhea. Reports constipation that improved with laxatives just before his pain started. States he has not been eating much because he is not hungry, not because PO intake increases his pain. He is currently on Eliquis for a.fib that started after his recent cardiac surgery. He has been on Vanc/zosyn since he was re-admitted yesterday for cellulitis of the right thigh. He was also taking PO augmentin at home for RUE cellulitis after IV infiltration.  CCS is asked to consult for persistent right sided abdominal pain and leukocytosis despite abx.   ROS: ROS  Family History  Problem Relation Age of Onset   Heart disease Mother    Cervical cancer Mother    Heart disease Father    Heart failure Father    Dislocations Daughter 8       Bilateral knee   Heart disease Maternal Uncle    Colon cancer Neg Hx    Esophageal cancer Neg Hx    Rectal cancer Neg Hx    Stomach cancer Neg Hx    Colon polyps Neg Hx     Past Medical History:  Diagnosis Date   Arthritis    Balanoposthitis    Cancer ([place]) 2013   prostate   CKD (chronic kidney  disease), stage III ([place])    Coronary artery disease    Crohn disease ([place])    followed by dr Hilarie Fredrickson--- dx 1984, terminal ileum-- prolonged clinical remission since bowel resection 2003   Diverticulosis    Dyspnea    with exertion   Edema of both lower extremities    Erythema    penis   Fatty liver    followed by dr Hilarie Fredrickson   Gout    12-08-2020  per pt last episode approx. 2016  right great toe   Hepatic cyst    History of adenomatous polyp of colon    History of kidney stones    History of prostate cancer    11-16-2004  s/p  radical prostatectomy with pelvic lymph node dissection   History of small bowel obstruction 2003   s/p  bowel resection   Hypertension    followed by pcp   (nuclear study 01-14-2018 in epic low risk without ischemia , nuclear ef 51% per cardiology note from dr berry)   Intermittent abdominal pain    followed by dr Hilarie Fredrickson---  due to adhesions and partial sbo   Internal hemorrhoids    Mesenteric artery stenosis [facility])    vascular-- dr Trula Slade   Mixed hyperlipidemia    Multinodular thyroid    Benign bilateral bx's 08-09-2010 ;  last ultrasound  in epic 09-19-2011    Nephrolithiasis    Polycythemia    currently followed by pcp;   previously seen by hematologist/ oncology--- dr Jerilynn Mages. Julien Nordmann,  lov in epic 01-28-2013  hx phlebotomy and donates blood   PSVT (paroxysmal supraventricular tachycardia) [facility])    cardiologist--- dr Gwenlyn Found ; episdoe at time of prostatectomy 2006 and recurrent 03/ 2019;  event monitor 01-15-2018 showed NSR,  echo 12-20-2017 with ef 60-65% mild LVH mild TR   Renal cyst, acquired    urologist-- dr Junious Silk   Vitamin D deficiency     Past Surgical History:  Procedure Laterality Date   APPENDECTOMY     BACTERIAL OVERGROWTH TEST N/A 06/15/2016   Procedure: BACTERIAL OVERGROWTH TEST;  Surgeon: Md Physician Gastroenterology, MD;  [facility];  Service: Gastroenterology;  Laterality: N/A;   COLONOSCOPY WITH PROPOFOL  last one  06-24-2019  dr pyrtle   CORONARY ARTERY BYPASS GRAFT N/A 05/07/2022   Procedure: CORONARY ARTERY BYPASS GRAFTING (CABG) X 3 , USING LEFT INTERNAL MAMMARY ARTERY AND RIGHT LEG GREATER SAPHENOUS VEIN HARVESTED ENDOSCOPICALLY;  Surgeon: Melrose Nakayama, MD;  Location: [place];  Service: Open Heart Surgery;  Laterality: N/A;   CYSTOSCOPY  WITH URETHRAL DORSAL FORSKIN INCISION  10-10-2018  _0    EXPLORATORY LAPAROTOMY W/ BOWEL RESECTION  05-22-2002  _1    distal ileum and cecum / appendectomy     LAPAROSCOPIC ASSISTED VENTRAL HERNIA REPAIR  05-12-2003  _2    LAPAROSCOPY EXTENSIVE LYSIS ADHESIONS AND VENTRAL HERNIA REPAIR  02-11-2008  _3    LEFT HEART CATH AND CORONARY ANGIOGRAPHY N/A 04/16/2022   Procedure: LEFT HEART CATH AND CORONARY ANGIOGRAPHY;  Surgeon: Lorretta Harp, MD;  Location: [facility];  Service: Cardiovascular;  Laterality: N/A;   LITHOTRIPSY  1980   PENILE BIOPSY N/A 12/13/2020   Procedure: PENILE AND FORESKIN BIOPSY;  Surgeon: Festus Aloe, MD;  Location: [facility];  Service: Urology;  Laterality: N/A;   RETROPUBIC PROSTATECTOMY  11-16-2004   _4    TEE WITHOUT CARDIOVERSION N/A 05/07/2022   Procedure: TRANSESOPHAGEAL ECHOCARDIOGRAM (TEE);  Surgeon: Melrose Nakayama, MD;  Location: [place];  Service: Open Heart Surgery;  Laterality: N/A;   TONSILLECTOMY  age 21    Social History:  reports that he quit smoking about 31 years ago. His smoking use included cigarettes. He has a 38.00 pack-year smoking history. His smokeless tobacco use includes chew. He reports that he does not drink alcohol and does not use drugs.  Allergies:  Allergies  Allergen Reactions   Colchicine Diarrhea, Nausea And Vomiting and Other (See Comments)    REACTION: nausea, diarrhea and interactions with 26m   Protamine Other (See Comments)    Likely mild protamine reaction with hypotension during CABG 2023    Medications Prior to Admission  Medication Sig Dispense  Refill   allopurinol (ZYLOPRIM) 100 MG tablet Take 1 tablet (100 mg total) by mouth 2 (two) times daily. 180 tablet 3   amiodarone (PACERONE) 200 MG tablet Take 1 tablet (200 mg total) by mouth daily. 30 tablet 2   amoxicillin-clavulanate (AUGMENTIN) 500-125 MG tablet Take 1 tablet (500 mg total) by mouth every 8 (eight) hours. 21 tablet 0   apixaban (ELIQUIS) 5 MG TABS tablet Take 1 tablet (5 mg total) by mouth 2 (two) times daily. 60 tablet 3   baclofen (LIORESAL) 10 MG tablet TAKE 1 TABLET BY MOUTH AT BEDTIME AS NEEDED FOR MUSCLE SPASMS. (Patient taking differently: Take 10 mg by mouth at  bedtime as needed for muscle spasms.) 90 tablet 3   Cholecalciferol (VITAMIN D3) 125 MCG (5000 UT) CAPS Take 5,000 Units by mouth See admin instructions. Take 1 capsule (5000 units) by mouth every day except on Sundays.     gemfibrozil (LOPID) 600 MG tablet Take 1 tablet (600 mg total) by mouth 3 (three) times a week. Monday , Wednesday, Friday (Patient taking differently: Take 600 mg by mouth every Monday, Wednesday, and Friday.) 39 tablet 3   hydrochlorothiazide (HYDRODIURIL) 12.5 MG tablet Take 12.5 mg by mouth daily.     losartan (COZAAR) 25 MG tablet Take 25 mg by mouth daily.     metoprolol tartrate (LOPRESSOR) 100 MG tablet Take 1 tablet (100 mg total) by mouth 2 (two) times daily. 60 tablet 3   nystatin-triamcinolone (MYCOLOG II) cream Apply 1 application topically 2 (two) times daily. (Patient taking differently: Apply 1 application  topically 2 (two) times daily as needed (rash).) 30 g 3   Omega-3 Fatty Acids (OMEGA-3 FISH OIL) 1200 MG CAPS Take 2,400 mg by mouth in the morning and at bedtime.     potassium chloride SA (KLOR-CON M20) 20 MEQ tablet Take 2 tablets (40 mEq total) by mouth daily. 180 tablet 3   traMADol (ULTRAM) 50 MG tablet Take 1 tablet (50 mg total) by mouth every 6 (six) hours as needed for moderate pain. 30 tablet 0   aspirin 81 MG EC tablet Take 81 mg by mouth every evening. (Patient  not taking: Reported on 06/05/2022)     atorvastatin (LIPITOR) 40 MG tablet TAKE 1 TABLET BY MOUTH DAILY AT 6 PM. (Patient not taking: Reported on 06/06/2022) 90 tablet 3     Physical Exam: Blood pressure (!) 106/58, pulse 88, temperature 97.9 F (36.6 C), temperature source Oral, resp. rate (!) 21, height _0  (1.727 m), weight 103.5 kg, SpO2 96 %. General: Pleasant white male  laying on hospital bed, labored respirations on [place] HEENT: head -normocephalic, atraumatic; Eyes: pupils equal, anicteric sclerae Neck- Trachea is midline, no thyromegaly CV- chest wall with median sternotomy incision that appears well-healing, regular rate and rhythem, no lower extremity edema Pulm- slightly labored respirations on supplemental O2 via [place], no wheezes or rales Abd- soft, mild distention, TTP RUQ and RLQ without guarding or peritonitis, no palpable hernias GU- deferred  MSK- UE/LE symmetrical, no cyanosis, clubbing, or edema. Neuro- nonfocal exam, gait not assessed  Psych- Alert and Oriented x3 with appropriate affect Skin: warm and dry, no rashes or lesions   Results for orders placed or performed during the hospital encounter of 06/09/2022 (from the past 48 hour(s))  CBC     Status: Abnormal   Collection Time: 05/20/22 12:56 AM  Result Value Ref Range   WBC 22.4 (H) 4.0 - 10.5 K/uL   RBC 4.06 (L) 4.22 - 5.81 MIL/uL   Hemoglobin 13.2 13.0 - 17.0 g/dL   HCT 39.2 39.0 - 52.0 %   MCV 96.6 80.0 - 100.0 fL   MCH 32.5 26.0 - 34.0 pg   MCHC 33.7 30.0 - 36.0 g/dL   RDW 15.9 (H) 11.5 - 15.5 %   Platelets 328 150 - 400 K/uL   nRBC 0.0 0.0 - 0.2 %    Comment: Performed at [facility], [place] [address] [place], [place] [address]  Basic metabolic panel     Status: Abnormal   Collection Time: 05/20/22 12:56 AM  Result Value Ref Range   Sodium 138 135 - 145 mmol/L  Potassium 3.5 3.5 - 5.1 mmol/L   Chloride 104 98 - 111 mmol/L   CO2 23 22 - 32 mmol/L   Glucose, Bld 129 (H) 70 - 99 mg/dL     Comment: Glucose reference range applies only to samples taken after fasting for at least 8 hours.   BUN 25 (H) 8 - 23 mg/dL   Creatinine, Ser 1.66 (H) 0.61 - 1.24 mg/dL   Calcium 8.9 8.9 - 10.3 mg/dL   GFR, Estimated 42 (L) >60 mL/min    Comment: (NOTE) Calculated using the CKD-EPI Creatinine Equation (2021)    Anion gap 11 5 - 15    Comment: Performed at [facility] [address] [place], [place] [address]  I-STAT 7, (LYTES, BLD GAS, ICA, H+H)     Status: Abnormal   Collection Time: 05/20/22  7:49 AM  Result Value Ref Range   pH, Arterial 7.478 (H) 7.35 - 7.45   pCO2 arterial 32.4 32 - 48 mmHg   pO2, Arterial 71 (L) 83 - 108 mmHg   Bicarbonate 23.9 20.0 - 28.0 mmol/L   TCO2 25 22 - 32 mmol/L   O2 Saturation 95 %   Acid-Base Excess 1.0 0.0 - 2.0 mmol/L   Sodium 139 135 - 145 mmol/L   Potassium 3.4 (L) 3.5 - 5.1 mmol/L   Calcium, Ion 1.22 1.15 - 1.40 mmol/L   HCT 36.0 (L) 39.0 - 52.0 %   Hemoglobin 12.2 (L) 13.0 - 17.0 g/dL   Patient temperature 99.3 F    Collection site RADIAL, ALLEN'S TEST ACCEPTABLE    Drawn by RT    Sample type ARTERIAL   Glucose, capillary     Status: Abnormal   Collection Time: 05/20/22 11:54 AM  Result Value Ref Range   Glucose-Capillary 156 (H) 70 - 99 mg/dL    Comment: Glucose reference range applies only to samples taken after fasting for at least 8 hours.  Glucose, capillary     Status: Abnormal   Collection Time: 05/20/22  3:22 PM  Result Value Ref Range   Glucose-Capillary 152 (H) 70 - 99 mg/dL    Comment: Glucose reference range applies only to samples taken after fasting for at least 8 hours.  Basic metabolic panel     Status: Abnormal   Collection Time: 05/20/22  4:36 PM  Result Value Ref Range   Sodium 138 135 - 145 mmol/L   Potassium 3.2 (L) 3.5 - 5.1 mmol/L   Chloride 103 98 - 111 mmol/L   CO2 25 22 - 32 mmol/L   Glucose, Bld 221 (H) 70 - 99 mg/dL    Comment: Glucose reference range applies only to samples taken after  fasting for at least 8 hours.   BUN 19 8 - 23 mg/dL   Creatinine, Ser 1.27 (H) 0.61 - 1.24 mg/dL   Calcium 8.6 (L) 8.9 - 10.3 mg/dL   GFR, Estimated 58 (L) >60 mL/min    Comment: (NOTE) Calculated using the CKD-EPI Creatinine Equation (2021)    Anion gap 10 5 - 15    Comment: Performed at [facility] [address], [place], [place] [address]  CBC     Status: Abnormal   Collection Time: 05/20/22  4:36 PM  Result Value Ref Range   WBC 24.6 (H) 4.0 - 10.5 K/uL   RBC 3.55 (L) 4.22 - 5.81 MIL/uL   Hemoglobin 11.6 (L) 13.0 - 17.0 g/dL   HCT 33.5 (L) 39.0 - 52.0 %  MCV 94.4 80.0 - 100.0 fL   MCH 32.7 26.0 - 34.0 pg   MCHC 34.6 30.0 - 36.0 g/dL   RDW 15.8 (H) 11.5 - 15.5 %   Platelets 282 150 - 400 K/uL   nRBC 0.0 0.0 - 0.2 %    Comment: Performed at [facility] [address], [place], [place] [address]  Glucose, capillary     Status: Abnormal   Collection Time: 05/20/22  7:50 PM  Result Value Ref Range   Glucose-Capillary 149 (H) 70 - 99 mg/dL    Comment: Glucose reference range applies only to samples taken after fasting for at least 8 hours.   Comment 1 Document in Chart   Glucose, capillary     Status: Abnormal   Collection Time: 05/20/22 11:37 PM  Result Value Ref Range   Glucose-Capillary 123 (H) 70 - 99 mg/dL    Comment: Glucose reference range applies only to samples taken after fasting for at least 8 hours.   Comment 1 Document in Chart   CBC     Status: Abnormal   Collection Time: 05/21/22  3:54 AM  Result Value Ref Range   WBC 26.9 (H) 4.0 - 10.5 K/uL   RBC 3.59 (L) 4.22 - 5.81 MIL/uL   Hemoglobin 11.6 (L) 13.0 - 17.0 g/dL   HCT 35.0 (L) 39.0 - 52.0 %   MCV 97.5 80.0 - 100.0 fL   MCH 32.3 26.0 - 34.0 pg   MCHC 33.1 30.0 - 36.0 g/dL   RDW 16.1 (H) 11.5 - 15.5 %   Platelets 267 150 - 400 K/uL   nRBC 0.0 0.0 - 0.2 %    Comment: Performed at [facility] [address], [place], [place] [address]  Comprehensive metabolic panel     Status:  Abnormal   Collection Time: 05/21/22  3:54 AM  Result Value Ref Range   Sodium 137 135 - 145 mmol/L   Potassium 3.1 (L) 3.5 - 5.1 mmol/L   Chloride 105 98 - 111 mmol/L   CO2 24 22 - 32 mmol/L   Glucose, Bld 258 (H) 70 - 99 mg/dL    Comment: Glucose reference range applies only to samples taken after fasting for at least 8 hours.   BUN 15 8 - 23 mg/dL   Creatinine, Ser 1.23 0.61 - 1.24 mg/dL   Calcium 8.4 (L) 8.9 - 10.3 mg/dL   Total Protein 5.7 (L) 6.5 - 8.1 g/dL   Albumin 2.4 (L) 3.5 - 5.0 g/dL   AST 106 (H) 15 - 41 U/L   ALT 61 (H) 0 - 44 U/L   Alkaline Phosphatase 104 38 - 126 U/L   Total Bilirubin 4.1 (H) 0.3 - 1.2 mg/dL   GFR, Estimated >60 >60 mL/min    Comment: (NOTE) Calculated using the CKD-EPI Creatinine Equation (2021)    Anion gap 8 5 - 15    Comment: Performed at [facility], [place] [address] [place], [place] [address]  Glucose, capillary     Status: Abnormal   Collection Time: 05/21/22  3:57 AM  Result Value Ref Range   Glucose-Capillary 245 (H) 70 - 99 mg/dL    Comment: Glucose reference range applies only to samples taken after fasting for at least 8 hours.   Comment 1 Document in Chart   Glucose, capillary     Status: Abnormal   Collection Time: 05/21/22  7:31 AM  Result Value Ref Range   Glucose-Capillary 138 (H)  70 - 99 mg/dL    Comment: Glucose reference range applies only to samples taken after fasting for at least 8 hours.   DG Abd Portable 1V  Result Date: 05/21/2022 CLINICAL DATA:  Abdominal pain EXAM: PORTABLE ABDOMEN - 1 VIEW COMPARISON:  05/20/2022 FINDINGS: The bowel gas pattern is nonobstructive. No gross free intraperitoneal air. No radio-opaque calculi or other significant radiographic abnormality are seen. IMPRESSION: Nonobstructive bowel gas pattern. Electronically Signed   By: Davina Poke D.O.   On: 05/21/2022 08:49   [place] EKG SITE RITE  Result Date: 05/20/2022 If Site Rite image not attached, placement could not be confirmed due to  current cardiac rhythm.  DG CHEST PORT 1 VIEW  Result Date: 05/20/2022 CLINICAL DATA:  Abdominal pain EXAM: PORTABLE CHEST 1 VIEW COMPARISON:  Two days ago FINDINGS: Chronic cardiomegaly. Prior CABG. Under penetrated bases due to soft tissue attenuation. Blunting at the lateral left costophrenic sulcus from scarring/fat based on recent abdominal CT. There is no edema, consolidation, or pneumothorax. IMPRESSION: Limited portable study without acute finding. Electronically Signed   By: Jorje Guild M.D.   On: 05/20/2022 07:29   DG Abd 1 View  Result Date: 05/20/2022 CLINICAL DATA:  Abdominal pain. EXAM: ABDOMEN - 1 VIEW COMPARISON:  05/21/2022 gas noted FINDINGS: The bowel gas pattern is normal. Throughout the colon up to the level of the rectum. Signs of previous ventral abdominal wall herniorrhaphy. No radio-opaque calculi or other significant radiographic abnormality are seen. IMPRESSION: Nonobstructive bowel gas pattern. Electronically Signed   By: Kerby Moors M.D.   On: 05/20/2022 07:29      Assessment/Plan Acute calculous cholecystitis - afebrile, WBC 26.9; he currently is hemodynamically stable with improved blood pressures.  - AST 106, ALT 61, alk phos 104, total bilirubin 4.1; increased from LFTs checked in April 2023 - patient had a CT scan performed 06/06/2022 which showed cholelithiasis, today I repeated a CT scan which showed gallbladder distention, wall thickening and pericholecystitis stranding, suggestive of cholecystitis. No ductal dilation on CT to suggest choledocholithiasis. - given recent cardiac surgery, recommend non-operative management with IR perc cholecystostomy tube followed by interval cholecystectomy once he is further out from his CABG. In order to get this procedure he will likely need to be switched from Eliquis to hep gtt -- will discuss anticoagulation with Dr. Roxan Hockey. IR consulted. - re-check LFTs in the AM, if bilirubin continues to rise he may need MRCP to  r/o choledocholithiasis. - Continue Zosyn, ok to stop vancomycin from gen surg perspective    PMH Crohn's disease -- no evidence of active crohn's   FEN - CLD VTE - SCDs, Eliquis - transition to hep gtt if ok with primary team in anticipation of perc chole tube ID - Vanc, Zosyn 8/6 >>  Admit - on cardiothoracic service  MMP as below per primary team --  CAD s/p CABG 05/07/22  A.fib - developed post-op and started on Eliquis  CKD III HTN HLD CAD DM2 with hyperglycemia   I reviewed nursing notes, Consultant TCTS notes, last 24 h vitals and pain scores, last 48 h intake and output, last 24 h labs and trends, and last 24 h imaging results.  [facility] 05/21/2022, 9:09 AM Please see Amion for pager number during day hours 7:00am-4:30pm or 7:00am -11:30am on weekends

## 2022-05-21 NOTE — Progress Notes (Signed)
  Subjective: Still has abdominal pain RLQ, improved from the weekend  Objective: Vital signs in last 24 hours: Temp:  [97.9 F (36.6 C)-100.3 F (37.9 C)] 97.9 F (36.6 C) (08/07 0728) Pulse Rate:  [85-110] 92 (08/07 0725) Cardiac Rhythm: Normal sinus rhythm (08/07 0400) Resp:  [19-31] 23 (08/07 0725) BP: (101-153)/(49-132) 136/68 (08/07 0725) SpO2:  [94 %-97 %] 95 % (08/07 0725) Weight:  [103.5 kg] 103.5 kg (08/07 0500)  Hemodynamic parameters for last 24 hours:    Intake/Output from previous day: 08/06 0701 - 08/07 0700 In: 6697.4 [I.V.:6220.4; IV Piggyback:477] Out: 1150 [Urine:1150] Intake/Output this shift: No intake/output data recorded.  General appearance: alert, cooperative, and no distress Neurologic: intact Heart: regular rate and rhythm Lungs: diminished breath sounds bibasilar Abdomen: soft, + RLQ tenderness, no rebound Extremities: RLE seroma, erythema decreased Wound: c,d,i  Lab Results: Recent Labs    05/20/22 1636 05/21/22 0354  WBC 24.6* 26.9*  HGB 11.6* 11.6*  HCT 33.5* 35.0*  PLT 282 267   BMET:  Recent Labs    05/20/22 1636 05/21/22 0354  NA 138 137  K 3.2* 3.1*  CL 103 105  CO2 25 24  GLUCOSE 221* 258*  BUN 19 15  CREATININE 1.27* 1.23  CALCIUM 8.6* 8.4*    PT/INR: No results for input(s): "LABPROT", "INR" in the last 72 hours. ABG    Component Value Date/Time   PHART 7.478 (H) 05/20/2022 0749   HCO3 23.9 05/20/2022 0749   TCO2 25 05/20/2022 0749   ACIDBASEDEF 6.0 (H) 05/07/2022 2302   O2SAT 95 05/20/2022 0749   CBG (last 3)  Recent Labs    05/20/22 2337 05/21/22 0357 05/21/22 0731  GLUCAP 123* 245* 138*    Assessment/Plan: S/P  Still has abdominal pain and tenderness but no peritoneal signs   Abdominal pain less than over the weekend, but not resolved Will keep on clears for now Elevated WBC concerning but only a low grade temp overnight Diarrhea on admission has resolved Continue Vanco and  Zosyn Hypokalemia- supplement K Hyperglycemia- change D5NS to 1/2 NS Creatinine normal On Eliquis  LOS: 3 days    Melrose Nakayama 05/21/2022

## 2022-05-21 NOTE — Progress Notes (Signed)
Chief Complaint: Patient was seen in consultation today for cholecystitis   Referring Physician(s): Dr. Ramirez(CCS)  Supervising Physician: Corrie Mckusick  Patient Status: [facility] - In-pt  History of Present Illness: Luis Deleon is a 81 y.o. male who underwent CABG about 2 weeks ago. He states he has had abd pain for a long time but more focal to the right side within the last week or so after surgery. Slowly uptrending WBC. CT abd ordered by general surgery which shows distended gallbladder with stone and wall thickening c/w cholecystitis. Given recent CABG and clinical condition, pt not felt to be immediate surgical candidate. IR is asked to place perc chole drain. PMHx, meds, labs, imaging, allergies reviewed. On Eliquis including this am, but has been held. Feels pretty rough, but has been tolerating some liquids.   Past Medical History:  Diagnosis Date   Arthritis    Balanoposthitis    Cancer [facility]) 2013   prostate   CKD (chronic kidney disease), stage III ([place])    Coronary artery disease    Crohn disease ([place])    followed by dr Hilarie Fredrickson--- dx 1984, terminal ileum-- prolonged clinical remission since bowel resection 2003   Diverticulosis    Dyspnea    with exertion   Edema of both lower extremities    Erythema    penis   Fatty liver    followed by dr Hilarie Fredrickson   Gout    12-08-2020  per pt last episode approx. 2016  right great toe   Hepatic cyst    History of adenomatous polyp of colon    History of kidney stones    History of prostate cancer    11-16-2004  s/p  radical prostatectomy with pelvic lymph node dissection   History of small bowel obstruction 2003   s/p  bowel resection   Hypertension    followed by pcp   (nuclear study 01-14-2018 in epic low risk without ischemia , nuclear ef 51% per cardiology note from dr berry)   Intermittent abdominal pain    followed by dr Hilarie Fredrickson---  due to adhesions and partial sbo   Internal hemorrhoids    Mesenteric  artery stenosis [facility])    vascular-- dr Trula Slade   Mixed hyperlipidemia    Multinodular thyroid    Benign bilateral bx's 08-09-2010 ;  last ultrasound in epic 09-19-2011    Nephrolithiasis    Polycythemia    currently followed by pcp;   previously seen by hematologist/ oncology--- dr Jerilynn Mages. Julien Nordmann,  lov in epic 01-28-2013  hx phlebotomy and donates blood   PSVT (paroxysmal supraventricular tachycardia) [place] [facility])    cardiologist--- dr Gwenlyn Found ; episdoe at time of prostatectomy 2006 and recurrent 03/ 2019;  event monitor 01-15-2018 showed NSR,  echo 12-20-2017 with ef 60-65% mild LVH mild TR   Renal cyst, acquired    urologist-- dr Junious Silk   Vitamin D deficiency     Past Surgical History:  Procedure Laterality Date   APPENDECTOMY     BACTERIAL OVERGROWTH TEST N/A 06/15/2016   Procedure: BACTERIAL OVERGROWTH TEST;  Surgeon: Md Physician Gastroenterology, MD;  Location[facility];  Service: Gastroenterology;  Laterality: N/A;   COLONOSCOPY WITH PROPOFOL  last one 06-24-2019  dr pyrtle   CORONARY ARTERY BYPASS GRAFT N/A 05/07/2022   Procedure: CORONARY ARTERY BYPASS GRAFTING (CABG) X 3 , USING LEFT INTERNAL MAMMARY ARTERY AND RIGHT LEG GREATER SAPHENOUS VEIN HARVESTED ENDOSCOPICALLY;  Surgeon: Melrose Nakayama, MD;  Location: [place];  Service: Open Heart  Surgery;  Laterality: N/A;   CYSTOSCOPY  WITH URETHRAL DORSAL FORSKIN INCISION  10-10-2018  @[facility]    EXPLORATORY LAPAROTOMY W/ BOWEL RESECTION  05-22-2002  @[facility]    distal ileum and cecum / appendectomy     LAPAROSCOPIC ASSISTED VENTRAL HERNIA REPAIR  05-12-2003  @[facility]    LAPAROSCOPY EXTENSIVE LYSIS ADHESIONS AND VENTRAL HERNIA REPAIR  02-11-2008  @[facility]    LEFT HEART CATH AND CORONARY ANGIOGRAPHY N/A 04/16/2022   Procedure: LEFT HEART CATH AND CORONARY ANGIOGRAPHY;  Surgeon: Lorretta Harp, MD;  Location: [place] [facility];  Service: Cardiovascular;  Laterality: N/A;   LITHOTRIPSY  1980   PENILE BIOPSY N/A 12/13/2020   Procedure: PENILE AND  FORESKIN BIOPSY;  Surgeon: Festus Aloe, MD;  Location: [facility];  Service: Urology;  Laterality: N/A;   RETROPUBIC PROSTATECTOMY  11-16-2004   @[facility]    TEE WITHOUT CARDIOVERSION N/A 05/07/2022   Procedure: TRANSESOPHAGEAL ECHOCARDIOGRAM (TEE);  Surgeon: Melrose Nakayama, MD;  Location: [place];  Service: Open Heart Surgery;  Laterality: N/A;   TONSILLECTOMY  age 40    Allergies: Colchicine and Protamine  Medications: Prior to Admission medications   Medication Sig Start Date End Date Taking? Authorizing Provider  allopurinol (ZYLOPRIM) 100 MG tablet Take 1 tablet (100 mg total) by mouth 2 (two) times daily. 01/15/22  Yes Dettinger, Fransisca Kaufmann, MD  amiodarone (PACERONE) 200 MG tablet Take 1 tablet (200 mg total) by mouth daily. 05/17/22  Yes Barrett, Erin R, PA-C  amoxicillin-clavulanate (AUGMENTIN) 500-125 MG tablet Take 1 tablet (500 mg total) by mouth every 8 (eight) hours. 05/17/22  Yes Barrett, Erin R, PA-C  apixaban (ELIQUIS) 5 MG TABS tablet Take 1 tablet (5 mg total) by mouth 2 (two) times daily. 05/15/22  Yes Barrett, Erin R, PA-C  baclofen (LIORESAL) 10 MG tablet TAKE 1 TABLET BY MOUTH AT BEDTIME AS NEEDED FOR MUSCLE SPASMS. Patient taking differently: Take 10 mg by mouth at bedtime as needed for muscle spasms. 01/15/22  Yes Dettinger, Fransisca Kaufmann, MD  Cholecalciferol (VITAMIN D3) 125 MCG (5000 UT) CAPS Take 5,000 Units by mouth See admin instructions. Take 1 capsule (5000 units) by mouth every day except on Sundays.   Yes [provider]  gemfibrozil (LOPID) 600 MG tablet Take 1 tablet (600 mg total) by mouth 3 (three) times a week. Monday , Wednesday, Friday Patient taking differently: Take 600 mg by mouth every Monday, Wednesday, and Friday. 01/15/22  Yes Dettinger, Fransisca Kaufmann, MD  hydrochlorothiazide (HYDRODIURIL) 12.5 MG tablet Take 12.5 mg by mouth daily.   Yes [provider]  losartan (COZAAR) 25 MG tablet Take 25 mg by mouth daily.   Yes [provider]  metoprolol tartrate (LOPRESSOR) 100 MG tablet Take 1 tablet (100 mg total) by mouth 2 (two) times daily. 05/15/22  Yes Barrett, Erin R, PA-C  nystatin-triamcinolone (MYCOLOG II) cream Apply 1 application topically 2 (two) times daily. Patient taking differently: Apply 1 application  topically 2 (two) times daily as needed (rash). 03/05/18  Yes Chipper Herb, MD  Omega-3 Fatty Acids (OMEGA-3 FISH OIL) 1200 MG CAPS Take 2,400 mg by mouth in the morning and at bedtime.   Yes [provider]  potassium chloride SA (KLOR-CON M20) 20 MEQ tablet Take 2 tablets (40 mEq total) by mouth daily. 10/02/21  Yes Dettinger, Fransisca Kaufmann, MD  traMADol (ULTRAM) 50 MG tablet Take 1 tablet (50 mg total) by mouth every 6 (six) hours as needed for moderate pain. 05/15/22  Yes Barrett, Junie Panning  R, PA-C  aspirin 81 MG EC tablet Take 81 mg by mouth every evening. Patient not taking: Reported on 05/20/2022    [provider]  atorvastatin (LIPITOR) 40 MG tablet TAKE 1 TABLET BY MOUTH DAILY AT 6 PM. Patient not taking: Reported on 05/31/2022 09/20/21   Dettinger, Fransisca Kaufmann, MD     Family History  Problem Relation Age of Onset   Heart disease Mother    Cervical cancer Mother    Heart disease Father    Heart failure Father    Dislocations Daughter 8       Bilateral knee   Heart disease Maternal Uncle    Colon cancer Neg Hx    Esophageal cancer Neg Hx    Rectal cancer Neg Hx    Stomach cancer Neg Hx    Colon polyps Neg Hx     Social History   Socioeconomic History   Marital status: Married    Spouse name: Katharine Look   Number of children: 1   Years of education: 12   Highest education level: 12th grade  Occupational History   Occupation: Retired     Comment: Event organiser  Tobacco Use   Smoking status: Former    Packs/day: 1.00    Years: 38.00    Total pack years: 38.00    Types: Cigarettes    Quit date: 03/19/1991    Years since quitting: 31.1   Smokeless tobacco: Current    Types:  Chew  Vaping Use   Vaping Use: Never used  Substance and Sexual Activity   Alcohol use: No   Drug use: No   Sexual activity: Never  Other Topics Concern   Not on file  Social History Narrative   Lives with wife. Daughter lives in [place] Strain: Low Risk  (02/10/2021)   Overall Financial Resource Strain (CARDIA)    Difficulty of Paying Living Expenses: Not hard at all  Food Insecurity: No Food Insecurity (02/10/2021)   Hunger Vital Sign    Worried About Running Out of Food in the Last Year: Never true    Ran Out of Food in the Last Year: Never true  Transportation Needs: No Transportation Needs (06/01/2022)   PRAPARE - Hydrologist (Medical): No    Lack of Transportation (Non-Medical): No  Physical Activity: Inactive (02/10/2021)   Exercise Vital Sign    Days of Exercise per Week: 0 days    Minutes of Exercise per Session: 0 min  Stress: No Stress Concern Present (02/10/2021)   [place]    Feeling of Stress : Not at all  Social Connections: Moderately Isolated (02/10/2021)   Social Connection and Isolation Panel [NHANES]    Frequency of Communication with Friends and Family: More than three times a week    Frequency of Social Gatherings with Friends and Family: Three times a week    Attends Religious Services: Never    Active Member of Clubs or Organizations: No    Attends Archivist Meetings: Never    Marital Status: Married    Review of Systems: A 12 point ROS discussed and pertinent positives are indicated in the HPI above.  All other systems are negative.  Review of Systems  Vital Signs: BP 132/70   Pulse 100   Temp (!) 97.4 F (36.3 C) (Oral)   Resp (!) 29   Ht 5' 8"  (1.727  m)   Wt 103.5 kg   SpO2 96%   BMI 34.69 kg/m   Physical Exam Constitutional:      Appearance: He is ill-appearing. He is not  toxic-appearing.  HENT:     Mouth/Throat:     Mouth: Mucous membranes are moist.     Pharynx: Oropharynx is clear.  Cardiovascular:     Rate and Rhythm: Normal rate and regular rhythm.     Heart sounds: Normal heart sounds.  Pulmonary:     Effort: Pulmonary effort is normal. No respiratory distress.     Breath sounds: Normal breath sounds.  Abdominal:     General: There is distension.     Palpations: Abdomen is soft.     Comments: RUQ tenderness  Neurological:     General: No focal deficit present.     Mental Status: He is alert and oriented to person, place, and time.  Psychiatric:        Mood and Affect: Mood normal.        Thought Content: Thought content normal.     Imaging: CT ABDOMEN PELVIS W CONTRAST  Result Date: 05/21/2022 CLINICAL DATA:  Right-sided abdominal pain EXAM: CT ABDOMEN AND PELVIS WITH CONTRAST TECHNIQUE: Multidetector CT imaging of the abdomen and pelvis was performed using the standard protocol following bolus administration of intravenous contrast. RADIATION DOSE REDUCTION: This exam was performed according to the departmental dose-optimization program which includes automated exposure control, adjustment of the mA and/or kV according to patient size and/or use of iterative reconstruction technique. CONTRAST:  182m OMNIPAQUE IOHEXOL 350 MG/ML SOLN COMPARISON:  05/30/2022 FINDINGS: Lower chest: Small bilateral pleural effusions are seen with interval increase in size. There are patchy infiltrates in the posterior lower lung fields with interval worsening. Coronary artery calcifications are seen. Metallic suture is seen in sternum suggest previous coronary bypass surgery. Small to moderate pericardial effusion is seen. Hepatobiliary: There is no dilation of bile ducts. Gallbladder is distended. There is wall thickening in gallbladder. There is mild edema in the pericholecystic region. There is calcified gallbladder stone. Pancreas: No focal abnormalities are seen.  Spleen: Unremarkable. Adrenals/Urinary Tract: Adrenals are unremarkable. There is no hydronephrosis. There are multiple smooth marginated fluid density lesions in both kidneys measuring up to 10 mm in maximum diameter with no significant change. There is 1 mm calcific density in the lower pole of left kidney. Ureters are not dilated. Urinary bladder is not distended. Stomach/Bowel: Stomach is not distended. Small bowel loops are not dilated. The appendix is not seen. There is no significant wall thickening and colon. Scattered diverticula are seen in the colon without signs of focal acute diverticulitis. Vascular/Lymphatic: Arterial calcifications are seen in aorta and its major branches. Reproductive: Prostate is not seen, possibly suggesting previous intervention. Other: Small ascites is present. There is no pneumoperitoneum. There is previous ventral hernia repair. Musculoskeletal: Lumbar spondylosis with spinal stenosis and encroachment of neural foramina at multiple levels. IMPRESSION: Gallbladder stone. Gallbladder is distended along with wall thickening and pericholecystic stranding. Possibility of acute cholecystitis is not excluded. Follow-up gallbladder sonogram should be considered. There is no evidence of intestinal obstruction or pneumoperitoneum. There is no hydronephrosis. Small bilateral pleural effusions with interval increase. Small to moderate pericardial effusion. There are patchy infiltrates with air bronchogram in the lower lung fields suggesting atelectasis/pneumonia with interval worsening. Severe coronary artery disease. Diverticulosis of colon without signs of focal diverticulitis. Bilateral renal cysts with no significant interval change. No follow-up studies are recommended. Lumbar spondylosis. Other  findings as described in the body of the report. These results will be called to the ordering clinician or representative by the Radiologist Assistant, and communication documented in the  PACS or [employer]. Electronically Signed   By: Elmer Picker M.D.   On: 05/21/2022 14:04   DG Abd Portable 1V  Result Date: 05/21/2022 CLINICAL DATA:  Abdominal pain EXAM: PORTABLE ABDOMEN - 1 VIEW COMPARISON:  05/20/2022 FINDINGS: The bowel gas pattern is nonobstructive. No gross free intraperitoneal air. No radio-opaque calculi or other significant radiographic abnormality are seen. IMPRESSION: Nonobstructive bowel gas pattern. Electronically Signed   By: Davina Poke D.O.   On: 05/21/2022 08:49   [place] EKG SITE RITE  Result Date: 05/20/2022 If Site Rite image not attached, placement could not be confirmed due to current cardiac rhythm.  DG CHEST PORT 1 VIEW  Result Date: 05/20/2022 CLINICAL DATA:  Abdominal pain EXAM: PORTABLE CHEST 1 VIEW COMPARISON:  Two days ago FINDINGS: Chronic cardiomegaly. Prior CABG. Under penetrated bases due to soft tissue attenuation. Blunting at the lateral left costophrenic sulcus from scarring/fat based on recent abdominal CT. There is no edema, consolidation, or pneumothorax. IMPRESSION: Limited portable study without acute finding. Electronically Signed   By: Jorje Guild M.D.   On: 05/20/2022 07:29   DG Abd 1 View  Result Date: 05/20/2022 CLINICAL DATA:  Abdominal pain. EXAM: ABDOMEN - 1 VIEW COMPARISON:  06/06/2022 gas noted FINDINGS: The bowel gas pattern is normal. Throughout the colon up to the level of the rectum. Signs of previous ventral abdominal wall herniorrhaphy. No radio-opaque calculi or other significant radiographic abnormality are seen. IMPRESSION: Nonobstructive bowel gas pattern. Electronically Signed   By: Kerby Moors M.D.   On: 05/20/2022 07:29   CT ABDOMEN PELVIS W CONTRAST  Result Date: 06/08/2022 CLINICAL DATA:  Crohn's exacerbation EXAM: CT ABDOMEN AND PELVIS WITH CONTRAST TECHNIQUE: Multidetector CT imaging of the abdomen and pelvis was performed using the standard protocol following bolus administration of intravenous  contrast. RADIATION DOSE REDUCTION: This exam was performed according to the departmental dose-optimization program which includes automated exposure control, adjustment of the mA and/or kV according to patient size and/or use of iterative reconstruction technique. CONTRAST:  4m OMNIPAQUE IOHEXOL 300 MG/ML  SOLN COMPARISON:  CT 01/18/2020 FINDINGS: Lower chest: Small left and trace right pleural effusions with adjacent basilar basilar atelectasis. Small pericardial effusion and retrosternal fluid. Recent median sternotomy with trace residual mediastinal gas. Per medical record, patient recently had a coronary artery bypass 05/07/2022. Hepatobiliary: Unchanged multiple small liver hypodensities, likely small cysts. Cholelithiasis. No evidence of cholecystitis. Pancreas: Unremarkable. No pancreatic ductal dilatation or surrounding inflammatory changes. Spleen: Normal in size without focal abnormality. Adrenals/Urinary Tract: Adrenal glands are unremarkable. There are multiple renal cysts of varying sizes, not simply changed from prior exam in April 2021. No follow-up imaging is recommended. No hydronephrosis. There are small nonobstructive stones in the right kidney largest measuring up to 3 mm. There are a few 2 mm nonobstructive stones in the left kidney as well. Bladder is mildly distended. Stomach/Bowel: No significant renal excretion on delay, suggesting poor renal function. There is no evidence of bowel obstruction.Prior appendectomy Vascular/Lymphatic: Aortoiliac atherosclerosis. No AAA. No lymphadenopathy. Reproductive: Prior prostatectomy. Other: Small fat containing inguinal hernias. Prior ventral hernia repair without evidence of ventral hernia recurrence. No ascites. No free air. Musculoskeletal: No acute osseous abnormality. No suspicious osseous lesion. Multilevel degenerative changes of the spine. IMPRESSION: No acute findings in the abdomen or pelvis. Small left and  trace right pleural effusions with  adjacent basilar atelectasis. Partially visualized postsurgical changes of recent median sternotomy. Trace residual mediastinal gas and small amount of retrosternal fluid. Small pericardial effusion. Cholelithiasis without evidence of cholecystitis. Prior ventral hernia repair without evidence of recurrent hernia. Electronically Signed   By: Maurine Simmering M.D.   On: 06/12/2022 14:52   DG Chest Port 1 View  Result Date: 06/03/2022 CLINICAL DATA:  CABG surgery on 05/07/2022 with weakness and lightheadedness following. Shortness of breath. EXAM: PORTABLE CHEST 1 VIEW COMPARISON:  Chest radiograph 05/12/2022 FINDINGS: Median sternotomy wires are again seen. The cardiomediastinal silhouette is stable with unchanged borderline cardiomegaly. There is a small left pleural effusion with adjacent left basilar opacities, grossly similar to the prior study. There is no significant right effusion. There is no focal consolidation on the right. There is no pulmonary edema. There is no pneumothorax There is no acute osseous abnormality. IMPRESSION: Unchanged small left pleural effusion with adjacent left basilar opacities. New or worsening focal airspace disease. Electronically Signed   By: Valetta Mole M.D.   On: 06/03/2022 12:55   DG Chest 2 View  Result Date: 05/12/2022 CLINICAL DATA:  Status post CABG. EXAM: CHEST - 2 VIEW COMPARISON:  05/09/2022 FINDINGS: RIGHT IJ central venous catheter sheath has been removed. Defibrillator/pacing pads overlying the LEFT chest are noted. UPPER limits normal heart size CABG changes again noted. LEFT basilar opacity/atelectasis and small bilateral pleural effusions are again noted. There is no evidence of pneumothorax. IMPRESSION: RIGHT IJ central venous catheter sheath removal without other significant change. Electronically Signed   By: Margarette Canada M.D.   On: 05/12/2022 12:09   ECHOCARDIOGRAM COMPLETE  Result Date: 05/10/2022    ECHOCARDIOGRAM REPORT   Patient Name:   ADELBERT GASPARD  Date of Exam: 05/10/2022 Medical Rec #:  [id number]      Height:       68.0 in Accession #:    [id number]     Weight:       240.1 lb Date of Birth:  [birth year]     BSA:          2.209 m Patient Age:    89 years       BP:           96/60 mmHg Patient Gender: M              HR:           67 bpm. Exam Location:  Inpatient Procedure: 2D Echo, Cardiac Doppler and Color Doppler Indications:    Ventricular tachycardia  History:        Patient has prior history of Echocardiogram examinations. CAD,                 Prior CABG; Risk Factors:Hypertension.  Sonographer:    Jyl Heinz Referring Phys: [id number] [place]  1. Left ventricular ejection fraction, by estimation, is 55 to 60%. The left ventricle has normal function. The left ventricle has no regional wall motion abnormalities. There is mild left ventricular hypertrophy. Left ventricular diastolic parameters are consistent with Grade II diastolic dysfunction (pseudonormalization).  2. Mildly D-shaped interventricular septum suggestive of RV pressure/volume overload. Right ventricular systolic function is mildly reduced. The right ventricular size is mildly enlarged.  3. The mitral valve is normal in structure. Trivial mitral valve regurgitation. No evidence of mitral stenosis.  4. The aortic valve is tricuspid. There is mild calcification of the aortic valve. Aortic valve regurgitation is not  visualized. No aortic stenosis is present.  5. Peak RV-RA gradient 21 mmHg, IVC not visualized. FINDINGS  Left Ventricle: Left ventricular ejection fraction, by estimation, is 55 to 60%. The left ventricle has normal function. The left ventricle has no regional wall motion abnormalities. The left ventricular internal cavity size was normal in size. There is  mild left ventricular hypertrophy. Left ventricular diastolic parameters are consistent with Grade II diastolic dysfunction (pseudonormalization). Right Ventricle: Mildly D-shaped interventricular septum  suggestive of RV pressure/volume overload. The right ventricular size is mildly enlarged. No increase in right ventricular wall thickness. Right ventricular systolic function is mildly reduced. Left Atrium: Left atrial size was normal in size. Right Atrium: Right atrial size was normal in size. Pericardium: There is no evidence of pericardial effusion. Mitral Valve: The mitral valve is normal in structure. There is mild calcification of the mitral valve leaflet(s). Trivial mitral valve regurgitation. No evidence of mitral valve stenosis. Tricuspid Valve: The tricuspid valve is normal in structure. Tricuspid valve regurgitation is trivial. Aortic Valve: The aortic valve is tricuspid. There is mild calcification of the aortic valve. Aortic valve regurgitation is not visualized. No aortic stenosis is present. Aortic valve peak gradient measures 5.9 mmHg. Pulmonic Valve: The pulmonic valve was normal in structure. Pulmonic valve regurgitation is trivial. Aorta: The aortic root is normal in size and structure. Venous: The inferior vena cava was not well visualized. IAS/Shunts: No atrial level shunt detected by color flow Doppler.  LEFT VENTRICLE PLAX 2D LVIDd:         4.20 cm     Diastology LVIDs:         2.90 cm     LV e' medial:    6.22 cm/s LV PW:         1.10 cm     LV E/e' medial:  11.8 LV IVS:        1.20 cm     LV e' lateral:   8.55 cm/s LVOT diam:     2.00 cm     LV E/e' lateral: 8.6 LV SV:         53 LV SV Index:   24 LVOT Area:     3.14 cm  LV Volumes (MOD) LV vol d, MOD A2C: 93.4 ml LV vol d, MOD A4C: 73.7 ml LV vol s, MOD A2C: 40.3 ml LV vol s, MOD A4C: 31.7 ml LV SV MOD A2C:     53.1 ml LV SV MOD A4C:     73.7 ml LV SV MOD BP:      50.4 ml RIGHT VENTRICLE RV Basal diam:  3.10 cm RV Mid diam:    3.00 cm RV S prime:     5.73 cm/s TAPSE (M-mode): 1.8 cm LEFT ATRIUM             Index        RIGHT ATRIUM           Index LA diam:        3.60 cm 1.63 cm/m   RA Area:     14.90 cm LA Vol (A2C):   56.7 ml 25.67  ml/m  RA Volume:   35.20 ml  15.94 ml/m LA Vol (A4C):   63.2 ml 28.61 ml/m LA Biplane Vol: 62.3 ml 28.21 ml/m  AORTIC VALVE AV Area (Vmax): 2.03 cm AV Vmax:        121.00 cm/s AV Peak Grad:   5.9 mmHg LVOT Vmax:      78.30 cm/s  LVOT Vmean:     53.900 cm/s LVOT VTI:       0.169 m  AORTA Ao Root diam: 3.30 cm Ao Asc diam:  3.30 cm MITRAL VALVE               TRICUSPID VALVE MV Area (PHT): 3.61 cm    TR Peak grad:   21.0 mmHg MV Decel Time: 210 msec    TR Vmax:        229.00 cm/s MV E velocity: 73.70 cm/s MV A velocity: 52.90 cm/s  SHUNTS MV E/A ratio:  1.39        Systemic VTI:  0.17 m                            Systemic Diam: 2.00 cm Dalton McleanMD Electronically signed by Franki Monte Signature Date/Time: 05/10/2022/3:30:54 PM    Final    DG Chest Port 1 View  Result Date: 05/09/2022 CLINICAL DATA:  Status post CABG EXAM: PORTABLE CHEST 1 VIEW COMPARISON:  05/08/2022 and prior studies FINDINGS: Thoracostomy and mediastinal tubes, and RIGHT IJ Swan-Ganz catheter have been removed. RIGHT IJ central venous catheter sheath remains. Cardiomediastinal silhouette is unchanged.  CABG again noted. Bibasilar opacities/atelectasis and trace effusions are again identified. There is no evidence of pneumothorax. IMPRESSION: 1. Removal of support apparatus without other significant change. No pneumothorax. 2. Bibasilar opacities/atelectasis and trace effusions. Electronically Signed   By: Margarette Canada M.D.   On: 05/09/2022 08:11   DG Chest Port 1 View  Result Date: 05/08/2022 CLINICAL DATA:  Chest soreness today. Evaluate CABG change, support devices. EXAM: PORTABLE CHEST 1 VIEW COMPARISON:  Portable chest yesterday at 1:36 p.m. FINDINGS: 5:07 a.m. Right IJ Swan-Ganz catheter tip remains, today with the tip in the right pulmonary artery. ETT/NGT have been removed. There is a mediastinal drain and left chest tube with no visible pneumothorax. There is a small left and minimal right pleural effusions. There is a low  inspiration. There is consolidation or atelectasis in the left base, atelectatic bands in the hypoinflated right base. The visualized lungs are otherwise clear. The mediastinal configuration is stable with aortic calcification and tortuosity, stable CABG change. IMPRESSION: 1. Removal NGT, ETT. 2. Swan-Ganz catheter tip in right pulmonary artery. 3. Left chest tube and single mediastinal drain in place with no visible pneumothorax. 4. Overall aeration unchanged with consolidation or atelectasis in the left base, small left and minimal right pleural effusions. Low inspiration. Electronically Signed   By: Telford Nab M.D.   On: 05/08/2022 07:47   ECHO INTRAOPERATIVE TEE  Result Date: 05/07/2022  *INTRAOPERATIVE TRANSESOPHAGEAL REPORT *  Patient Name:   CHANE COWDEN Date of Exam: 05/07/2022 Medical Rec #:  [id number]      Height:       68.0 in Accession #:    [id number]     Weight:       230.0 lb Date of Birth:  1941/04/30     BSA:          2.17 m Patient Age:    70 years       BP:           135/52 mmHg Patient Gender: M              HR:           55 bpm. Exam Location:  Anesthesiology Transesophogeal exam was perform intraoperatively during surgical procedure. Patient was closely monitored  under general anesthesia during the entirety of examination. Indications:     Coronary Artery Disease Sonographer:     Bernadene Person RDCS Performing Phys: [place] Diagnosing Phys: Annye Asa MD Complications: No known complications during this procedure. POST-OP IMPRESSIONS limited post-CPB exam: The patient separated easily from CPB, requiring low-dose Phenylephrine for hemodynamic support _ Left Ventricle: The left ventricular function appears normal, unchanged from pre-bypass images. Overall contracility improved to normal with time off of CPB, and progression to atrial pacing. EF 60% with normal wall motion. _ Right Ventricle: The right ventricle appears unchanged from pre-bypass images. The  cavity was mildly dilated, improving with time off of CPB. _ Aortic Valve: The aortic valve function appears unchanged from pre-bypass images. _ Mitral Valve: The mitral valve function appears unchanged from pre-bypass images. Mitral regurgitation improved to trivial MR. _ Tricuspid Valve: The tricuspid valve function appears unchanged from pre-bypass images. PRE-OP FINDINGS  Left Ventricle: The left ventricle has normal systolic function, with an ejection fraction of 60-65%, measured 69%. The cavity size was normal. No evidence of left ventricular regional wall motion abnormalities. There is no left ventricular hypertrophy.  Left ventricular diastolic function was not evaluated. Right Ventricle: The right ventricle has normal systolic function. The cavity was normal. There is no increase in right ventricular wall thickness. Left Atrium: Left atrial size was normal in size. No left atrial/left atrial appendage thrombus was detected. Left atrial appendage velocity is normal at greater than 40 cm/s. Right Atrium: Right atrial size was normal in size. Catheter present in the right atrium. Interatrial Septum: No atrial level shunt detected by color flow Doppler. Pericardium: There is no evidence of pericardial effusion. Mitral Valve: The mitral valve is normal in structure. Mitral valve regurgitation is trivial by color flow Doppler. There is no evidence of mitral valve vegetation. Pulmonary venous flow is normal. There is no evidence of mitral stenosis, with peak gradient 2 mmHg, mean gradient 1 mmHg. Tricuspid Valve: The tricuspid valve was normal in structure. Tricuspid valve regurgitation is trivial by color flow Doppler. No evidence of tricuspid stenosis is present. There is no evidence of tricuspid valve vegetation. Aortic Valve: The aortic valve is tricuspid. Aortic valve regurgitation was not visualized by color flow Doppler. There is no stenosis of the aortic valve, with peak gradient 5 mmHg, mean gradient 3  mmHg. There is no evidence of aortic valve vegetation. Pulmonic Valve: The pulmonic valve was normal in structure, with normal leaflet excursion. No evidence of pulmonic stenosis. Pulmonic valve regurgitation is trivial around the PA catheter by color flow Doppler. Aorta: The aortic root and ascending aorta appear normal in size and structure. There is evidence of plaque in the descending aorta; Grade III, measuring 3-39m in size. Pulmonary Artery: The pulmonary artery is of normal size. Venous: The inferior vena cava is normal in size with greater than 50% respiratory variability, suggesting right atrial pressure of 3 mmHg. Shunts: There is no evidence of an atrial septal defect. +-------------+--------++ AORTIC VALVE          +-------------+--------++ AV Mean Grad:3.0 mmHg +-------------+--------++ +-------------+--------++ MITRAL VALVE          +-------------+--------++ MV Mean grad:1.0 mmHg +-------------+--------++  CAnnye AsaMD Electronically signed by CAnnye AsaMD Signature Date/Time: 05/07/2022/2:13:20 PM    Final    DG Chest Port 1 View  Result Date: 05/07/2022 CLINICAL DATA:  Status post CABG EXAM: PORTABLE CHEST 1 VIEW COMPARISON:  05/03/2022 FINDINGS: Interval postoperative changes from recent CABG.  ET tube terminates 4.3 cm above the carina. Enteric tube extends into the stomach. Right IJ approach pulmonary arterial catheter terminates at the proximal pulmonary outflow. Mediastinal drain and left basilar chest tube in place. Stable heart size. Aortic atherosclerosis. Slightly low lung volumes. Small left pleural effusion. No pneumothorax. IMPRESSION: 1. Interval postoperative changes from recent CABG. Support lines and tubes in appropriate position. 2. Small left pleural effusion. Electronically Signed   By: Davina Poke D.O.   On: 05/07/2022 13:51   VAS US DOPPLER PRE CABG  Result Date: 05/03/2022 PREOPERATIVE VASCULAR EVALUATION Patient Name:  SHAHRUKH PASCH   Date of Exam:   05/03/2022 Medical Rec #: [id number]       Accession #:    [id number] Date of Birth: 02/13/1941      Patient Gender: M Patient Age:   37 years Exam Location:  [facility] Procedure:      VAS US DOPPLER PRE CABG Referring Phys: Remo Lipps HENDRICKSON --------------------------------------------------------------------------------  Indications:      Pre-CABG. Risk Factors:     Hypertension, hyperlipidemia, past history of smoking. Other Factors:    CKD III. Comparison Study: No prior study Performing Technologist: Sharion Dove RVS  Examination Guidelines: A complete evaluation includes B-mode imaging, spectral Doppler, color Doppler, and power Doppler as needed of all accessible portions of each vessel. Bilateral testing is considered an integral part of a complete examination. Limited examinations for reoccurring indications may be performed as noted.  Right Carotid Findings: +----------+--------+--------+--------+------------+------------------+           PSV cm/sEDV cm/sStenosisDescribe    Comments           +----------+--------+--------+--------+------------+------------------+ CCA Prox  116     19                          intimal thickening +----------+--------+--------+--------+------------+------------------+ CCA Distal127     20                          intimal thickening +----------+--------+--------+--------+------------+------------------+ ICA Prox  82      21              heterogenous                   +----------+--------+--------+--------+------------+------------------+ ICA Mid   107     21                                             +----------+--------+--------+--------+------------+------------------+ ICA Distal85      20                                             +----------+--------+--------+--------+------------+------------------+ ECA       41      3                                               +----------+--------+--------+--------+------------+------------------+ +----------+--------+-------+--------+------------+           PSV cm/sEDV cmsDescribeArm Pressure +----------+--------+-------+--------+------------+ Subclavian78                                  +----------+--------+-------+--------+------------+ +---------+--------+--+--------+--+  VertebralPSV cm/s55EDV cm/s16 +---------+--------+--+--------+--+ Left Carotid Findings: +----------+--------+--------+--------+------------+------------------+           PSV cm/sEDV cm/sStenosisDescribe    Comments           +----------+--------+--------+--------+------------+------------------+ CCA Prox  135     26                          intimal thickening +----------+--------+--------+--------+------------+------------------+ CCA Distal115     23                          intimal thickening +----------+--------+--------+--------+------------+------------------+ ICA Prox  75      20              heterogenous                   +----------+--------+--------+--------+------------+------------------+ ICA Mid   62      19                                             +----------+--------+--------+--------+------------+------------------+ ICA Distal66      20                                             +----------+--------+--------+--------+------------+------------------+ ECA       101     16                                             +----------+--------+--------+--------+------------+------------------+  +----------+--------+--------+--------+------------+ SubclavianPSV cm/sEDV cm/sDescribeArm Pressure +----------+--------+--------+--------+------------+           71                                   +----------+--------+--------+--------+------------+ +---------+--------+--+--------+-+ VertebralPSV cm/s32EDV cm/s3 +---------+--------+--+--------+-+  ABI Findings:  +--------+------------------+-----+-----------+--------+ Right   Rt Pressure (mmHg)IndexWaveform   Comment  +--------+------------------+-----+-----------+--------+ [id number]                    multiphasic         +--------+------------------+-----+-----------+--------+ PTA                            multiphasic         +--------+------------------+-----+-----------+--------+ DP                             multiphasic         +--------+------------------+-----+-----------+--------+ +--------+------------------+-----+-----------+-------+ Left    Lt Pressure (mmHg)IndexWaveform   Comment +--------+------------------+-----+-----------+-------+ [id number]                    multiphasic        +--------+------------------+-----+-----------+-------+ PTA                            multiphasic        +--------+------------------+-----+-----------+-------+ DP  multiphasic        +--------+------------------+-----+-----------+-------+  Right Doppler Findings: +--------+--------+-----+-----------+--------+ Site    PressureIndexDoppler    Comments +--------+--------+-----+-----------+--------+ [id number]          multiphasic         +--------+--------+-----+-----------+--------+ Radial               multiphasic         +--------+--------+-----+-----------+--------+ Ulnar                multiphasic         +--------+--------+-----+-----------+--------+  Left Doppler Findings: +--------+--------+-----+-----------+--------+ Site    PressureIndexDoppler    Comments +--------+--------+-----+-----------+--------+ [id number]          multiphasic         +--------+--------+-----+-----------+--------+ Radial               multiphasic         +--------+--------+-----+-----------+--------+ Ulnar                multiphasic         +--------+--------+-----+-----------+--------+  Summary: Right Carotid: Velocities  in the right ICA are consistent with a 1-39% stenosis. Left Carotid: Velocities in the left ICA are consistent with a 1-39% stenosis. Vertebrals:  Bilateral vertebral arteries demonstrate antegrade flow. Subclavians: Normal flow hemodynamics were seen in bilateral subclavian              arteries. Right Upper Extremity: Doppler waveforms decrease <50% with right radial compression. Doppler waveforms decrease <50% with right ulnar compression. Left Upper Extremity: Doppler waveforms remain within normal limits with left radial compression. Doppler waveform obliterate with left ulnar compression.   Electronically signed by Jamelle Haring on 05/03/2022 at 8:14:30 PM.    Final    DG Chest 2 View  Result Date: 05/03/2022 CLINICAL DATA:  Preadmit coronary bypass surgery EXAM: CHEST - 2 VIEW COMPARISON:  05/16/2020 FINDINGS: No acute airspace disease or effusion. Normal cardiac size. Aortic atherosclerosis. No pneumothorax. IMPRESSION: No active cardiopulmonary disease. Electronically Signed   By: Donavan Foil M.D.   On: 05/03/2022 19:34    Labs:  CBC: Recent Labs    05/19/22 0045 05/20/22 0056 05/20/22 0749 05/20/22 1636 05/21/22 0354  WBC 15.8* 22.4*  --  24.6* 26.9*  HGB 12.5* 13.2 12.2* 11.6* 11.6*  HCT 37.4* 39.2 36.0* 33.5* 35.0*  PLT 312 328  --  282 267    COAGS: Recent Labs    05/03/22 0857 05/07/22 1336  INR 1.0 1.4*  APTT 30 32    BMP: Recent Labs    05/19/22 0045 05/20/22 0056 05/20/22 0749 05/20/22 1636 05/21/22 0354  NA 136 138 139 138 137  K 3.1* 3.5 3.4* 3.2* 3.1*  CL 104 104  --  103 105  CO2 23 23  --  25 24  GLUCOSE 94 129*  --  221* 258*  BUN 26* 25*  --  19 15  CALCIUM 8.4* 8.9  --  8.6* 8.4*  CREATININE 1.58* 1.66*  --  1.27* 1.23  GFRNONAA 44* 42*  --  58* >60    LIVER FUNCTION TESTS: Recent Labs    07/19/21 0919 01/22/22 0935 05/03/22 0857 05/21/22 0354  BILITOT 1.7* 2.5* 2.8* 4.1*  AST 32 33 28 106*  ALT 28 30 20  61*  ALKPHOS 62 65 46 104   PROT 6.2 6.3 6.1* 5.7*  ALBUMIN 3.7 3.8 3.1* 2.4*     Assessment and Plan: Acute cholecystitis. Imaging reviewed with Dr. Earleen Newport. Amenable to percutaneous cholecystostomy tube  placement. Will make NPO p MN. Risks and benefits discussed with the patient including, but not limited to bleeding, infection, gallbladder perforation, bile leak, sepsis or even death.  All of the patient's questions were answered, patient is agreeable to proceed. Consent signed and in chart.    Electronically Signed: Ascencion Dike, PA-C 05/21/2022, 4:43 PM   I spent a total of 20 in face to face in clinical consultation, greater than 50% of which was counseling/coordinating care for perc chole drain

## 2022-05-22 ENCOUNTER — Inpatient Hospital Stay (HOSPITAL_COMMUNITY): Payer: Medicare HMO

## 2022-05-22 DIAGNOSIS — L03115 Cellulitis of right lower limb: Secondary | ICD-10-CM | POA: Diagnosis not present

## 2022-05-22 HISTORY — PX: IR PERC CHOLECYSTOSTOMY: IMG2326

## 2022-05-22 LAB — COMPREHENSIVE METABOLIC PANEL
ALT: 53 U/L — ABNORMAL HIGH (ref 0–44)
AST: 71 U/L — ABNORMAL HIGH (ref 15–41)
Albumin: 2.2 g/dL — ABNORMAL LOW (ref 3.5–5.0)
Alkaline Phosphatase: 93 U/L (ref 38–126)
Anion gap: 8 (ref 5–15)
BUN: 17 mg/dL (ref 8–23)
CO2: 23 mmol/L (ref 22–32)
Calcium: 8.4 mg/dL — ABNORMAL LOW (ref 8.9–10.3)
Chloride: 103 mmol/L (ref 98–111)
Creatinine, Ser: 1.35 mg/dL — ABNORMAL HIGH (ref 0.61–1.24)
GFR, Estimated: 54 mL/min — ABNORMAL LOW (ref >60)
Glucose, Bld: 121 mg/dL — ABNORMAL HIGH (ref 70–99)
Potassium: 3.5 mmol/L (ref 3.5–5.1)
Sodium: 134 mmol/L — ABNORMAL LOW (ref 135–145)
Total Bilirubin: 4.3 mg/dL — ABNORMAL HIGH (ref 0.3–1.2)
Total Protein: 5.4 g/dL — ABNORMAL LOW (ref 6.5–8.1)

## 2022-05-22 LAB — GLUCOSE, CAPILLARY
Glucose-Capillary: 105 mg/dL — ABNORMAL HIGH (ref 70–99)
Glucose-Capillary: 117 mg/dL — ABNORMAL HIGH (ref 70–99)
Glucose-Capillary: 90 mg/dL (ref 70–99)
Glucose-Capillary: 96 mg/dL (ref 70–99)
Glucose-Capillary: 96 mg/dL (ref 70–99)
Glucose-Capillary: 97 mg/dL (ref 70–99)
Glucose-Capillary: 99 mg/dL (ref 70–99)

## 2022-05-22 LAB — CBC
HCT: 35.2 % — ABNORMAL LOW (ref 39.0–52.0)
Hemoglobin: 11.9 g/dL — ABNORMAL LOW (ref 13.0–17.0)
MCH: 32.5 pg (ref 26.0–34.0)
MCHC: 33.8 g/dL (ref 30.0–36.0)
MCV: 96.2 fL (ref 80.0–100.0)
Platelets: 284 10*3/uL (ref 150–400)
RBC: 3.66 MIL/uL — ABNORMAL LOW (ref 4.22–5.81)
RDW: 16.3 % — ABNORMAL HIGH (ref 11.5–15.5)
WBC: 33.4 10*3/uL — ABNORMAL HIGH (ref 4.0–10.5)
nRBC: 0 % (ref 0.0–0.2)

## 2022-05-22 LAB — TYPE AND SCREEN
ABO/RH(D): A POS
Antibody Screen: NEGATIVE

## 2022-05-22 LAB — PROTIME-INR
INR: 2.1 — ABNORMAL HIGH (ref 0.8–1.2)
Prothrombin Time: 23.3 seconds — ABNORMAL HIGH (ref 11.4–15.2)

## 2022-05-22 MED ORDER — LIDOCAINE HCL 1 % IJ SOLN
INTRAMUSCULAR | Status: AC
  Filled 2022-05-22: qty 20

## 2022-05-22 MED ORDER — IOHEXOL 300 MG/ML  SOLN
100.0000 mL | Freq: Once | INTRAMUSCULAR | Status: AC | PRN
  Administered 2022-05-22: 15 mL

## 2022-05-22 MED ORDER — SODIUM CHLORIDE 0.9% IV SOLUTION
Freq: Once | INTRAVENOUS | Status: AC
Start: 2022-05-22 — End: 2022-05-22

## 2022-05-22 MED ORDER — MIDAZOLAM HCL 2 MG/2ML IJ SOLN
INTRAMUSCULAR | Status: AC | PRN
  Administered 2022-05-22: .5 mg via INTRAVENOUS

## 2022-05-22 MED ORDER — MIDAZOLAM HCL 2 MG/2ML IJ SOLN
INTRAMUSCULAR | Status: AC
  Filled 2022-05-22: qty 2

## 2022-05-22 MED ORDER — FENTANYL CITRATE (PF) 100 MCG/2ML IJ SOLN
INTRAMUSCULAR | Status: AC | PRN
  Administered 2022-05-22: 25 ug via INTRAVENOUS

## 2022-05-22 MED ORDER — CEFAZOLIN SODIUM-DEXTROSE 2-4 GM/100ML-% IV SOLN
2.0000 g | INTRAVENOUS | Status: AC
Start: 2022-05-23 — End: 2022-05-24
  Filled 2022-05-22: qty 100

## 2022-05-22 MED ORDER — ORAL CARE MOUTH RINSE: 15.0000 mL | OROMUCOSAL | Status: DC | PRN

## 2022-05-22 MED ORDER — FENTANYL CITRATE (PF) 100 MCG/2ML IJ SOLN
INTRAMUSCULAR | Status: AC
  Filled 2022-05-22: qty 2

## 2022-05-22 MED ORDER — SODIUM CHLORIDE 0.9% FLUSH
5.0000 mL | Freq: Three times a day (TID) | INTRAVENOUS | Status: DC
  Administered 2022-05-22 – 2022-05-27 (×15): 5 mL

## 2022-05-22 MED ORDER — CEFAZOLIN SODIUM-DEXTROSE 2-4 GM/100ML-% IV SOLN: 2.0000 g | Freq: Three times a day (TID) | INTRAVENOUS | Status: DC

## 2022-05-22 NOTE — Progress Notes (Signed)
Subjective/Chief Complaint: Pt stable this AM Abd pain cont For IR procedure this AM   Objective: Vital signs in last 24 hours: Temp:  [97.4 F (36.3 C)-98.8 F (37.1 C)] 98.8 F (37.1 C) (08/08 0727) Pulse Rate:  [91-104] 92 (08/08 0700) Resp:  [16-34] 27 (08/08 0700) BP: (88-140)/(50-84) 119/66 (08/08 0700) SpO2:  [92 %-96 %] 93 % (08/08 0700) Weight:  [110.4 kg] 110.4 kg (08/08 0500) Last BM Date : 05/31/2022  Intake/Output from previous day: 08/07 0701 - 08/08 0700 In: 1485 [P.O.:480; I.V.:670.9; IV Piggyback:334] Out: 925 [Urine:925] Intake/Output this shift: No intake/output data recorded.  PE:  Constitutional: No acute distress, conversant, appears states age. Eyes: Anicteric sclerae, moist conjunctiva, no lid lag Lungs: Clear to auscultation bilaterally, normal respiratory effort CV: regular rate and rhythm, no murmurs, no peripheral edema, pedal pulses 2+ GI: Soft, no masses or hepatosplenomegaly, tender to palpation RUQ Skin: No rashes, palpation reveals normal turgor Psychiatric: appropriate judgment and insight, oriented to person, place, and time   Lab Results:  Recent Labs    05/21/22 0354 05/22/22 0424  WBC 26.9* 33.4*  HGB 11.6* 11.9*  HCT 35.0* 35.2*  PLT 267 284   BMET Recent Labs    05/21/22 0354 05/22/22 0424  NA 137 134*  K 3.1* 3.5  CL 105 103  CO2 24 23  GLUCOSE 258* 121*  BUN 15 17  CREATININE 1.23 1.35*  CALCIUM 8.4* 8.4*   PT/INR No results for input(s): "LABPROT", "INR" in the last 72 hours. ABG Recent Labs    05/20/22 0749  PHART 7.478*  HCO3 23.9    Studies/Results: DG Chest Port 1 View  Result Date: 05/22/2022 CLINICAL DATA:  20802; this of breath EXAM: PORTABLE CHEST 1 VIEW COMPARISON:  Radiograph dated May 20, 2022 FINDINGS: The cardiomediastinal silhouette is unchanged in contour.Status post median sternotomy and CABG. Elevation of the RIGHT hemidiaphragm, unchanged. RIGHT upper extremity PICC tip  terminates over the superior cavoatrial junction. Atherosclerotic calcifications of the aorta. Small bilateral pleural effusions. No pneumothorax. LEFT retrocardiac opacity, likely atelectasis. Visualized abdomen is unremarkable. IMPRESSION: Small bilateral pleural effusions and favored LEFT basilar atelectasis. Electronically Signed   By: Valentino Saxon M.D.   On: 05/22/2022 08:02   CT ABDOMEN PELVIS W CONTRAST  Result Date: 05/21/2022 CLINICAL DATA:  Right-sided abdominal pain EXAM: CT ABDOMEN AND PELVIS WITH CONTRAST TECHNIQUE: Multidetector CT imaging of the abdomen and pelvis was performed using the standard protocol following bolus administration of intravenous contrast. RADIATION DOSE REDUCTION: This exam was performed according to the departmental dose-optimization program which includes automated exposure control, adjustment of the mA and/or kV according to patient size and/or use of iterative reconstruction technique. CONTRAST:  133m OMNIPAQUE IOHEXOL 350 MG/ML SOLN COMPARISON:  05/27/2022 FINDINGS: Lower chest: Small bilateral pleural effusions are seen with interval increase in size. There are patchy infiltrates in the posterior lower lung fields with interval worsening. Coronary artery calcifications are seen. Metallic suture is seen in sternum suggest previous coronary bypass surgery. Small to moderate pericardial effusion is seen. Hepatobiliary: There is no dilation of bile ducts. Gallbladder is distended. There is wall thickening in gallbladder. There is mild edema in the pericholecystic region. There is calcified gallbladder stone. Pancreas: No focal abnormalities are seen. Spleen: Unremarkable. Adrenals/Urinary Tract: Adrenals are unremarkable. There is no hydronephrosis. There are multiple smooth marginated fluid density lesions in both kidneys measuring up to 10 mm in maximum diameter with no significant change. There is 1 mm calcific density in the  lower pole of left kidney. Ureters are  not dilated. Urinary bladder is not distended. Stomach/Bowel: Stomach is not distended. Small bowel loops are not dilated. The appendix is not seen. There is no significant wall thickening and colon. Scattered diverticula are seen in the colon without signs of focal acute diverticulitis. Vascular/Lymphatic: Arterial calcifications are seen in aorta and its major branches. Reproductive: Prostate is not seen, possibly suggesting previous intervention. Other: Small ascites is present. There is no pneumoperitoneum. There is previous ventral hernia repair. Musculoskeletal: Lumbar spondylosis with spinal stenosis and encroachment of neural foramina at multiple levels. IMPRESSION: Gallbladder stone. Gallbladder is distended along with wall thickening and pericholecystic stranding. Possibility of acute cholecystitis is not excluded. Follow-up gallbladder sonogram should be considered. There is no evidence of intestinal obstruction or pneumoperitoneum. There is no hydronephrosis. Small bilateral pleural effusions with interval increase. Small to moderate pericardial effusion. There are patchy infiltrates with air bronchogram in the lower lung fields suggesting atelectasis/pneumonia with interval worsening. Severe coronary artery disease. Diverticulosis of colon without signs of focal diverticulitis. Bilateral renal cysts with no significant interval change. No follow-up studies are recommended. Lumbar spondylosis. Other findings as described in the body of the report. These results will be called to the ordering clinician or representative by the Radiologist Assistant, and communication documented in the PACS or [employer]. Electronically Signed   By: Elmer Picker M.D.   On: 05/21/2022 14:04   DG Abd Portable 1V  Result Date: 05/21/2022 CLINICAL DATA:  Abdominal pain EXAM: PORTABLE ABDOMEN - 1 VIEW COMPARISON:  05/20/2022 FINDINGS: The bowel gas pattern is nonobstructive. No gross free intraperitoneal air. No  radio-opaque calculi or other significant radiographic abnormality are seen. IMPRESSION: Nonobstructive bowel gas pattern. Electronically Signed   By: Davina Poke D.O.   On: 05/21/2022 08:49    Anti-infectives: Anti-infectives (From admission, onward)    Start     Dose/Rate Route Frequency Ordered Stop   05/20/22 1700  vancomycin (VANCOREADY) IVPB 1250 mg/250 mL  Status:  Discontinued        1,250 mg 166.7 mL/hr over 90 Minutes Intravenous Every 48 hours 05/23/2022 1643 05/20/22 1012   05/20/22 1400  piperacillin-tazobactam (ZOSYN) IVPB 3.375 g        3.375 g 12.5 mL/hr over 240 Minutes Intravenous Every 8 hours 05/20/22 1003     05/20/22 1100  vancomycin (VANCOREADY) IVPB 750 mg/150 mL        750 mg 150 mL/hr over 60 Minutes Intravenous Every 24 hours 05/20/22 1012     05/23/2022 1645  vancomycin (VANCOREADY) IVPB 1750 mg/350 mL        1,750 mg 175 mL/hr over 120 Minutes Intravenous  Once 05/27/2022 1643 06/09/2022 2000   06/07/2022 1630  ceFAZolin (ANCEF) IVPB 1 g/50 mL premix        1 g 100 mL/hr over 30 Minutes Intravenous  Once 05/31/2022 1616 06/14/2022 1720       Assessment/Plan: Acute calculous cholecystitis - afebrile, WBC 33.4; he currently is hemodynamically stable.  - for perc chole tube today.  TB is elev today could be d/t acute ccy.  No diltation of the CBD on imaging.  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

## 2022-05-22 NOTE — Progress Notes (Signed)
CT surgery PM rounds  Patient resting quietly without complaint For gallbladder drainage procedure by IR tomorrow  Blood pressure 108/69, pulse 97, temperature 97.8 F (36.6 C), temperature source Oral, resp. rate (!) 27, height 5' 8"  (1.727 m), weight 103.5 kg, SpO2 92 %.

## 2022-05-22 NOTE — Sedation Documentation (Signed)
Vital signs stable. 

## 2022-05-22 NOTE — Progress Notes (Signed)
  Subjective: Feels a little better this morning.  Still some abdominal discomfort.  Objective: Vital signs in last 24 hours: Temp:  [97.4 F (36.3 C)-98.9 F (37.2 C)] 98.4 F (36.9 C) (08/08 1244) Pulse Rate:  [90-104] 92 (08/08 1244) Cardiac Rhythm: Normal sinus rhythm (08/08 0800) Resp:  [16-32] 25 (08/08 1244) BP: (84-138)/(50-84) 117/64 (08/08 1244) SpO2:  [92 %-97 %] 97 % (08/08 1244) Weight:  [110.4 kg] 110.4 kg (08/08 0500)  Hemodynamic parameters for last 24 hours:    Intake/Output from previous day: 08/07 0701 - 08/08 0700 In: 1485 [P.O.:480; I.V.:670.9; IV Piggyback:334] Out: 925 [Urine:925] Intake/Output this shift: Total I/O In: 530.7 [I.V.:66.6; Blood:309.2; IV Piggyback:155] Out: 150 [Urine:150]  General appearance: alert, cooperative, and no distress Neurologic: intact Heart: regular rate and rhythm Lungs: diminished breath sounds bibasilar Abdomen: Mildly distended, right upper and lower quadrant tenderness Seroma right leg with mild erythema  Lab Results: Recent Labs    05/21/22 0354 05/22/22 0424  WBC 26.9* 33.4*  HGB 11.6* 11.9*  HCT 35.0* 35.2*  PLT 267 284   BMET:  Recent Labs    05/21/22 0354 05/22/22 0424  NA 137 134*  K 3.1* 3.5  CL 105 103  CO2 24 23  GLUCOSE 258* 121*  BUN 15 17  CREATININE 1.23 1.35*  CALCIUM 8.4* 8.4*    PT/INR:  Recent Labs    05/22/22 0757  LABPROT 23.3*  INR 2.1*   ABG    Component Value Date/Time   PHART 7.478 (H) 05/20/2022 0749   HCO3 23.9 05/20/2022 0749   TCO2 25 05/20/2022 0749   ACIDBASEDEF 6.0 (H) 05/07/2022 2302   O2SAT 95 05/20/2022 0749   CBG (last 3)  Recent Labs    05/22/22 0425 05/22/22 0725 05/22/22 1042  GLUCAP 117* 96 99    Assessment/Plan: ID-admitted with fevers, elevated white blood cell count, possible sepsis. White blood cell count up to 33 K.  On Vanco and Zosyn.  Cholecystitis is likely source.  On Zosyn Continue Vanco for right leg wound infection to  complete 7-day course.  GI - cholecystitis-for IR drain placement today.  They requested FFP for an INR of 2.1.  Transfusion was ordered.  Neuro-intact.  Cardiovascular-in sinus rhythm.  Hemodynamics improved with volume resuscitation.  Eliquis on hold for procedure.  Will resume once safe from the standpoint of gallbladder drain.  Respiratory-continue I-S.  Renal-creatinine up slightly to 1.35.  Weight is up since admission and I/O's positive yesterday.  Monitor.      LOS: 4 days    Melrose Nakayama 05/22/2022

## 2022-05-22 NOTE — Procedures (Signed)
Interventional Radiology Procedure Note  Procedure: Cholecystostomy (10.2 fr)  Indication: Acute cholecystitis  Findings: Please refer to procedural dictation for full description.  Complications: None  EBL: < 10 mL  Miachel Roux, MD [phone]

## 2022-05-22 NOTE — H&P (Signed)
EVENING ROUNDS NOTE :     [address]       [place] [address]             [phone]                      Total Length of Stay:  LOS: 4 days  Events:   No events Up to chair    BP 116/73   Pulse 97   Temp (!) 97.5 F (36.4 C) (Oral)   Resp (!) 24   Ht 5' 8"  (1.727 m)   Wt 110.4 kg   SpO2 96%   BMI 37.01 kg/m          0.45 % NaCl with KCl 20 mEq / L 75 mL/hr at 05/22/22 1600   sodium chloride Stopped (05/22/22 0433)   amiodarone 30 mg/hr (05/22/22 1600)   [START ON 05/23/2022]  ceFAZolin (ANCEF) IV     piperacillin-tazobactam (ZOSYN)  IV 12.5 mL/hr at 05/22/22 1600   vancomycin Stopped (05/22/22 1113)    I/O last 3 completed shifts: In: 2868.4 [P.O.:480; I.V.:1771.3; IV Piggyback:617.1] Out: 1425 [Urine:1425]      Latest Ref Rng & Units 05/22/2022    4:24 AM 05/21/2022    3:54 AM 05/20/2022    4:36 PM  CBC  WBC 4.0 - 10.5 K/uL 33.4  26.9  24.6   Hemoglobin 13.0 - 17.0 g/dL 11.9  11.6  11.6   Hematocrit 39.0 - 52.0 % 35.2  35.0  33.5   Platelets 150 - 400 K/uL 284  267  282        Latest Ref Rng & Units 05/22/2022    4:24 AM 05/21/2022    3:54 AM 05/20/2022    4:36 PM  BMP  Glucose 70 - 99 mg/dL 121  258  221   BUN 8 - 23 mg/dL 17  15  19    Creatinine 0.61 - 1.24 mg/dL 1.35  1.23  1.27   Sodium 135 - 145 mmol/L 134  137  138   Potassium 3.5 - 5.1 mmol/L 3.5  3.1  3.2   Chloride 98 - 111 mmol/L 103  105  103   CO2 22 - 32 mmol/L 23  24  25    Calcium 8.9 - 10.3 mg/dL 8.4  8.4  8.6     ABG    Component Value Date/Time   PHART 7.478 (H) 05/20/2022 0749   PCO2ART 32.4 05/20/2022 0749   PO2ART 71 (L) 05/20/2022 0749   HCO3 23.9 05/20/2022 0749   TCO2 25 05/20/2022 0749   ACIDBASEDEF 6.0 (H) 05/07/2022 2302   O2SAT 95 05/20/2022 0749       Melodie Bouillon, MD 05/22/2022 5:51 PM

## 2022-05-23 ENCOUNTER — Ambulatory Visit (HOSPITAL_COMMUNITY): Payer: Medicare HMO | Admitting: Nurse Practitioner

## 2022-05-23 ENCOUNTER — Ambulatory Visit: Payer: Medicare HMO

## 2022-05-23 DIAGNOSIS — L03115 Cellulitis of right lower limb: Secondary | ICD-10-CM | POA: Diagnosis not present

## 2022-05-23 LAB — COMPREHENSIVE METABOLIC PANEL
ALT: 41 U/L (ref 0–44)
AST: 62 U/L — ABNORMAL HIGH (ref 15–41)
Albumin: 1.9 g/dL — ABNORMAL LOW (ref 3.5–5.0)
Alkaline Phosphatase: 93 U/L (ref 38–126)
Anion gap: 9 (ref 5–15)
BUN: 21 mg/dL (ref 8–23)
CO2: 23 mmol/L (ref 22–32)
Calcium: 8.2 mg/dL — ABNORMAL LOW (ref 8.9–10.3)
Chloride: 102 mmol/L (ref 98–111)
Creatinine, Ser: 1.47 mg/dL — ABNORMAL HIGH (ref 0.61–1.24)
GFR, Estimated: 49 mL/min — ABNORMAL LOW (ref >60)
Glucose, Bld: 94 mg/dL (ref 70–99)
Potassium: 3.4 mmol/L — ABNORMAL LOW (ref 3.5–5.1)
Sodium: 134 mmol/L — ABNORMAL LOW (ref 135–145)
Total Bilirubin: 3.3 mg/dL — ABNORMAL HIGH (ref 0.3–1.2)
Total Protein: 5.4 g/dL — ABNORMAL LOW (ref 6.5–8.1)

## 2022-05-23 LAB — CBC
HCT: 34.1 % — ABNORMAL LOW (ref 39.0–52.0)
Hemoglobin: 11.3 g/dL — ABNORMAL LOW (ref 13.0–17.0)
MCH: 31.9 pg (ref 26.0–34.0)
MCHC: 33.1 g/dL (ref 30.0–36.0)
MCV: 96.3 fL (ref 80.0–100.0)
Platelets: 299 10*3/uL (ref 150–400)
RBC: 3.54 MIL/uL — ABNORMAL LOW (ref 4.22–5.81)
RDW: 16.4 % — ABNORMAL HIGH (ref 11.5–15.5)
WBC: 20.9 10*3/uL — ABNORMAL HIGH (ref 4.0–10.5)
nRBC: 0 % (ref 0.0–0.2)

## 2022-05-23 LAB — PREPARE FRESH FROZEN PLASMA: Unit division: 0

## 2022-05-23 LAB — BPAM FFP
Blood Product Expiration Date: 202308122359
ISSUE DATE / TIME: 202308081114
Unit Type and Rh: 6200

## 2022-05-23 LAB — CULTURE, BLOOD (ROUTINE X 2)
Culture: NO GROWTH
Culture: NO GROWTH

## 2022-05-23 LAB — GLUCOSE, CAPILLARY
Glucose-Capillary: 92 mg/dL (ref 70–99)
Glucose-Capillary: 85 mg/dL (ref 70–99)
Glucose-Capillary: 84 mg/dL (ref 70–99)
Glucose-Capillary: 94 mg/dL (ref 70–99)

## 2022-05-23 MED ORDER — AMIODARONE HCL 200 MG PO TABS
200.0000 mg | ORAL_TABLET | Freq: Two times a day (BID) | ORAL | Status: DC
  Administered 2022-05-23 – 2022-05-24 (×4): 200 mg via ORAL
  Filled 2022-05-23 (×4): qty 1

## 2022-05-23 MED ORDER — ASPIRIN 81 MG PO TBEC
81.0000 mg | DELAYED_RELEASE_TABLET | Freq: Every day | ORAL | Status: DC
  Administered 2022-05-23 – 2022-05-27 (×5): 81 mg via ORAL
  Filled 2022-05-23 (×5): qty 1

## 2022-05-23 MED ORDER — SODIUM CHLORIDE 0.9 % IV SOLN: Freq: Once | INTRAVENOUS | Status: AC

## 2022-05-23 MED ORDER — PHENYLEPHRINE HCL-NACL 20-0.9 MG/250ML-% IV SOLN
0.0000 ug/min | INTRAVENOUS | Status: DC
  Administered 2022-05-23: 10 ug/min via INTRAVENOUS
  Administered 2022-05-24: 17 ug/min via INTRAVENOUS
  Filled 2022-05-23 (×2): qty 250

## 2022-05-23 MED ORDER — POTASSIUM CHLORIDE 10 MEQ/50ML IV SOLN
10.0000 meq | INTRAVENOUS | Status: AC
  Administered 2022-05-23 (×3): 10 meq via INTRAVENOUS
  Filled 2022-05-23 (×3): qty 50

## 2022-05-23 MED ORDER — PANTOPRAZOLE SODIUM 40 MG PO TBEC
40.0000 mg | DELAYED_RELEASE_TABLET | Freq: Every day | ORAL | Status: DC
  Administered 2022-05-23 – 2022-05-27 (×5): 40 mg via ORAL
  Filled 2022-05-23 (×5): qty 1

## 2022-05-23 MED ORDER — SODIUM CHLORIDE 0.9 % IV SOLN: INTRAVENOUS | Status: DC | PRN

## 2022-05-23 NOTE — Progress Notes (Signed)
Pharmacy Antibiotic Note  Luis Deleon is a 81 y.o. male admitted on 05/17/2022 with cellulitis.  PT presented with shortness of breath, s/p recent CABG, and concern for worsening of cellulitis on PTA prescription for Augmentin. Pharmacy has been consulted for vancomycin and Zosyn dosing.   WBC decreasing appropriately to 20.9, afebrile. S/p cholecystostomy 8/8. Plan for 7 days of vancomycin for cellulitis (stop date on 8/10). Per surgery notes plan to continue zosyn with augmentin for 7-10 days at discharge.   Plan: Continue Zosyn 3.375 g every 8 hours  Continue vancomycin to 750 mg every 24 hours - no levels planned given stop date Will adjust based on renal function, clinical progress, and cultures  Height: 5' 8"  (172.7 cm) Weight: 102.5 kg (225 lb 15.5 oz) IBW/kg (Calculated) : 68.4  Temp (24hrs), Avg:98 F (36.7 C), Min:97.5 F (36.4 C), Max:98.8 F (37.1 C)  Recent Labs  Lab 05/19/2022 1328 05/21/2022 1441 06/01/2022 1653 05/20/22 0056 05/20/22 1636 05/21/22 0354 05/21/22 0948 05/21/22 1256 05/22/22 0424 05/23/22 0425 05/23/22 0918  WBC  --   --    < > 22.4* 24.6* 26.9*  --   --  33.4*  --  20.9*  CREATININE  --   --    < > 1.66* 1.27* 1.23  --   --  1.35* 1.47*  --   LATICACIDVEN 2.0* 2.4*  --   --   --   --  1.4 1.5  --   --   --    < > = values in this interval not displayed.     Estimated Creatinine Clearance: 48 mL/min (A) (by C-G formula based on SCr of 1.47 mg/dL (H)).    Allergies  Allergen Reactions   Colchicine Diarrhea, Nausea And Vomiting and Other (See Comments)    REACTION: nausea, diarrhea and interactions with 21m   Protamine Other (See Comments)    Likely mild protamine reaction with hypotension during CABG 2023    Antimicrobials this admission: Vanc 8/4 >>  Zosyn 8/6 >>  Ancef 8/4  Dose adjustments this admission: Vanc 1250 mg q48h to 750 mg q24h  Microbiology results: 8/4 BCx: NGTD  Thank you for allowing pharmacy to participate in this  patient's care.  KAntonietta Jewel PharmD, BBluff CityClinical Pharmacist  Phone: [phone]/06/2022 11:40 AM  Please check AMION for all [place] numbers After 10:00 PM, call [phone]

## 2022-05-23 NOTE — TOC Initial Note (Signed)
Transition of Care [facility]) - Initial/Assessment Note    Patient Details  Name: ADRIN Deleon MRN: [id number] Date of Birth: 10/04/1941  Transition [facility]) CM/SW Contact:    Luis Roys, RN Phone Number: 05/23/2022, 3:34 PM  Clinical Narrative: Risk for readmission assessment completed. Recent CABG-now s/p cholecystostomy with drain placement on 05/22/22. PTA patient was from home with family support. Patient is active with Luis Deleon for PT services. Office will follow for services post hospitalization.               Expected Discharge Plan: Luis Deleon Barriers to Discharge: Continued Medical Work up  Expected Discharge Plan and Services Expected Discharge Plan: Luis Deleon: Home Health, Resumption of Svcs/PTA Provider Living arrangements for the past 2 months: Single Family Home                   HH Arranged: PT HH Agency: Luis Deleon Date [place]: 05/23/22   Representative spoke with at [place]: Luis Deleon  Prior Living Arrangements/Services Living arrangements for the past 2 months: [place] Lives with:: Spouse (has support of daughter.) Patient language and need for interpreter reviewed:: Yes Do you feel safe going back to the place where you live?: Yes      Need for Family Participation in Patient Care: Yes (Comment) Care giver support system in place?: Yes (comment)   Criminal Activity/Legal Deleon Pertinent to Current Situation/Hospitalization: No - Comment as needed  Activities of Daily Living Home Assistive Devices/Equipment: Cane (specify quad or straight) ADL Screening (condition at time of admission) Patient's cognitive ability adequate to safely complete daily activities?: Yes Is the patient deaf or have difficulty hearing?: Yes Does the patient have difficulty seeing, even when wearing glasses/contacts?: No Does the patient have  difficulty concentrating, remembering, or making decisions?: No Patient able to express need for assistance with ADLs?: No Does the patient have difficulty dressing or bathing?: No Independently performs ADLs?: Yes (appropriate for developmental age) Does the patient have difficulty walking or climbing stairs?: Yes Weakness of Legs: Both Weakness of Arms/Hands: None  Permission Sought/Granted Permission sought to share information with : Family Supports, Case Freight forwarder, Chartered certified accountant granted to share information with : Yes, Verbal Permission Granted         Alcohol / Substance Use: Not Applicable Luis Deleon: No (comment)  Admission diagnosis:  Cellulitis of right leg [L03.115] AKI (acute kidney injury) ([place]) [N17.9] Hypotension, unspecified hypotension type [I95.9] Patient Active Problem List   Diagnosis Date Noted   Cellulitis of right leg 05/30/2022   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   CAD (coronary artery disease) 04/23/2022   Bilateral lower extremity edema 03/02/2022   PSVT (paroxysmal supraventricular tachycardia) ([place]) 05/20/2020   Atypical chest pain 12/19/2017   Abdominal aortic atherosclerosis ([place]) 62/83/1517   Metabolic syndrome 61/60/7371   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   Hyperlipidemia 07/28/2008   Gout, unspecified 07/28/2008   HTN (hypertension) 07/28/2008   Crohn disease ([place]) 07/28/2008   DIVERTICULOSIS, COLON 07/28/2008   History of prostate cancer 07/28/2008   SMALL BOWEL OBSTRUCTION, HX OF 07/28/2008   NEPHROLITHIASIS, HX OF 07/28/2008   PCP:  Dettinger, Luis Kaufmann, MD Pharmacy:   [id number] [id number]: [phone]: [phone]  Readmission Risk Interventions    05/17/2022  2:19 PM  Readmission Risk Prevention Plan  Post Dischage Appt Complete  Medication Screening Complete  Transportation Screening Complete

## 2022-05-23 NOTE — Progress Notes (Signed)
Referring Physician(s): Dr. Rosendo Gros  Supervising Physician: Daryll Brod  Patient Status:  [facility] - In-pt  Chief Complaint:  Acute cholecystitis s/p cholecystostomy tube placement   Subjective:  Pt sitting upright in recliner. He is A&O. He states he is feeling much better. He denies fever, chills, N/V. He endorses severe fatigue. RN at bedsides states pt is able to ambulate in hall with walker.   Allergies: Colchicine and Protamine  Medications: Prior to Admission medications   Medication Sig Start Date End Date Taking? Authorizing Provider  allopurinol (ZYLOPRIM) 100 MG tablet Take 1 tablet (100 mg total) by mouth 2 (two) times daily. 01/15/22  Yes Dettinger, Fransisca Kaufmann, MD  amiodarone (PACERONE) 200 MG tablet Take 1 tablet (200 mg total) by mouth daily. 05/17/22  Yes Barrett, Erin R, PA-C  amoxicillin-clavulanate (AUGMENTIN) 500-125 MG tablet Take 1 tablet (500 mg total) by mouth every 8 (eight) hours. 05/17/22  Yes Barrett, Erin R, PA-C  apixaban (ELIQUIS) 5 MG TABS tablet Take 1 tablet (5 mg total) by mouth 2 (two) times daily. 05/15/22  Yes Barrett, Erin R, PA-C  baclofen (LIORESAL) 10 MG tablet TAKE 1 TABLET BY MOUTH AT BEDTIME AS NEEDED FOR MUSCLE SPASMS. Patient taking differently: Take 10 mg by mouth at bedtime as needed for muscle spasms. 01/15/22  Yes Dettinger, Fransisca Kaufmann, MD  Cholecalciferol (VITAMIN D3) 125 MCG (5000 UT) CAPS Take 5,000 Units by mouth See admin instructions. Take 1 capsule (5000 units) by mouth every day except on Sundays.   Yes [provider]  gemfibrozil (LOPID) 600 MG tablet Take 1 tablet (600 mg total) by mouth 3 (three) times a week. Monday , Wednesday, Friday Patient taking differently: Take 600 mg by mouth every Monday, Wednesday, and Friday. 01/15/22  Yes Dettinger, Fransisca Kaufmann, MD  hydrochlorothiazide (HYDRODIURIL) 12.5 MG tablet Take 12.5 mg by mouth daily.   Yes [provider]  losartan (COZAAR) 25 MG tablet Take 25 mg by mouth daily.    Yes [provider]  metoprolol tartrate (LOPRESSOR) 100 MG tablet Take 1 tablet (100 mg total) by mouth 2 (two) times daily. 05/15/22  Yes Barrett, Erin R, PA-C  nystatin-triamcinolone (MYCOLOG II) cream Apply 1 application topically 2 (two) times daily. Patient taking differently: Apply 1 application  topically 2 (two) times daily as needed (rash). 03/05/18  Yes Chipper Herb, MD  Omega-3 Fatty Acids (OMEGA-3 FISH OIL) 1200 MG CAPS Take 2,400 mg by mouth in the morning and at bedtime.   Yes [provider]  potassium chloride SA (KLOR-CON M20) 20 MEQ tablet Take 2 tablets (40 mEq total) by mouth daily. 10/02/21  Yes Dettinger, Fransisca Kaufmann, MD  traMADol (ULTRAM) 50 MG tablet Take 1 tablet (50 mg total) by mouth every 6 (six) hours as needed for moderate pain. 05/15/22  Yes Barrett, Erin R, PA-C  aspirin 81 MG EC tablet Take 81 mg by mouth every evening. Patient not taking: Reported on 06/12/2022    [provider]  atorvastatin (LIPITOR) 40 MG tablet TAKE 1 TABLET BY MOUTH DAILY AT 6 PM. Patient not taking: Reported on 05/25/2022 09/20/21   Dettinger, Fransisca Kaufmann, MD     Vital Signs: BP (!) 80/54 (BP Location: Left Arm)   Pulse 80   Temp 97.6 F (36.4 C) (Oral)   Resp 20   Ht 5' 8"  (1.727 m)   Wt 225 lb 15.5 oz (102.5 kg)   SpO2 93%   BMI 34.36 kg/m   Physical Exam Vitals  reviewed.  Constitutional:      General: He is not in acute distress.    Appearance: Normal appearance. He is ill-appearing.  HENT:     Head: Normocephalic and atraumatic.  Eyes:     Extraocular Movements: Extraocular movements intact.     Pupils: Pupils are equal, round, and reactive to light.  Pulmonary:     Effort: Pulmonary effort is normal. No respiratory distress.  Abdominal:     Comments: Drain flushes/aspirates easily. Site unremarkable with sutures/statlock in place. Scant amt dark red, bloody OP in gravity bag. Dressing C/D/I.    Skin:    General: Skin is warm and dry.  Neurological:      Mental Status: He is alert and oriented to person, place, and time.  Psychiatric:        Mood and Affect: Mood normal.        Behavior: Behavior normal.        Thought Content: Thought content normal.        Judgment: Judgment normal.     Imaging: IR Perc Cholecystostomy  Result Date: 05/22/2022 INDICATION: 81 year old gentleman with acute calculus cholecystitis presents to IR for cholecystostomy drain placement. EXAM: Ultrasound and fluoroscopy guided cholecystostomy drain placement MEDICATIONS: Patient currently on inpatient regimen of broad-spectrum antibiotics. ANESTHESIA/SEDATION: Moderate (conscious) sedation was employed during this procedure. A total of Versed 0.5 mg and Fentanyl 25 mcg was administered intravenously. Moderate Sedation Time: 10 minutes. The patient's level of consciousness and vital signs were monitored continuously by radiology nursing throughout the procedure under my direct supervision. FLUOROSCOPY TIME:  Radiation Exposure Index (as provided by the fluoroscopic device): 8 mGy Kerma COMPLICATIONS: None immediate. PROCEDURE: Informed written consent was obtained from the patient after a thorough discussion of the procedural risks, benefits and alternatives. All questions were addressed. Maximal Sterile Barrier Technique was utilized including caps, mask, sterile gowns, sterile gloves, sterile drape, hand hygiene and skin antiseptic. A timeout was performed prior to the initiation of the procedure. Patient positioned supine on the angiography table. Right upper quadrant abdominal skin prepped and draped in the usual sterile fashion. Sterile probe cover and sterile gel utilized throughout the procedure. Using ultrasound and fluoroscoping guidance, the gallbladder was accessed was accessed with a 21 ga needle. Contrast administered through the needle confirmed appropriate access within the gallbladder lumen. 0.018 inch guidewire advanced into the gallbladder lumen and the  needle was exchanged for a transitional dilator set. Percutaneous contrast administration demonstrated appropriate access the gallbladder lumen. Filling defect consistent with gallstone. 10.2 fr multipurpose pigtail drain was inserted over the guidewire. The catheter tip was positioned in the gallbladder lumen. The catheter was secured to skin with suture and connected to bag. IMPRESSION: 10.2 French cholecystostomy drain placed using fluoroscopic and ultrasound guidance. PLAN: Follow-up in 6-8 weeks in IR clinic for cholangiogram. Electronically Signed   By: Miachel Roux M.D.   On: 05/22/2022 16:16   DG Chest Port 1 View  Result Date: 05/22/2022 CLINICAL DATA:  35329; this of breath EXAM: PORTABLE CHEST 1 VIEW COMPARISON:  Radiograph dated May 20, 2022 FINDINGS: The cardiomediastinal silhouette is unchanged in contour.Status post median sternotomy and CABG. Elevation of the RIGHT hemidiaphragm, unchanged. RIGHT upper extremity PICC tip terminates over the superior cavoatrial junction. Atherosclerotic calcifications of the aorta. Small bilateral pleural effusions. No pneumothorax. LEFT retrocardiac opacity, likely atelectasis. Visualized abdomen is unremarkable. IMPRESSION: Small bilateral pleural effusions and favored LEFT basilar atelectasis. Electronically Signed   By: Valentino Saxon M.D.   On:  05/22/2022 08:02   CT ABDOMEN PELVIS W CONTRAST  Result Date: 05/21/2022 CLINICAL DATA:  Right-sided abdominal pain EXAM: CT ABDOMEN AND PELVIS WITH CONTRAST TECHNIQUE: Multidetector CT imaging of the abdomen and pelvis was performed using the standard protocol following bolus administration of intravenous contrast. RADIATION DOSE REDUCTION: This exam was performed according to the departmental dose-optimization program which includes automated exposure control, adjustment of the mA and/or kV according to patient size and/or use of iterative reconstruction technique. CONTRAST:  184m OMNIPAQUE IOHEXOL 350 MG/ML  SOLN COMPARISON:  06/09/2022 FINDINGS: Lower chest: Small bilateral pleural effusions are seen with interval increase in size. There are patchy infiltrates in the posterior lower lung fields with interval worsening. Coronary artery calcifications are seen. Metallic suture is seen in sternum suggest previous coronary bypass surgery. Small to moderate pericardial effusion is seen. Hepatobiliary: There is no dilation of bile ducts. Gallbladder is distended. There is wall thickening in gallbladder. There is mild edema in the pericholecystic region. There is calcified gallbladder stone. Pancreas: No focal abnormalities are seen. Spleen: Unremarkable. Adrenals/Urinary Tract: Adrenals are unremarkable. There is no hydronephrosis. There are multiple smooth marginated fluid density lesions in both kidneys measuring up to 10 mm in maximum diameter with no significant change. There is 1 mm calcific density in the lower pole of left kidney. Ureters are not dilated. Urinary bladder is not distended. Stomach/Bowel: Stomach is not distended. Small bowel loops are not dilated. The appendix is not seen. There is no significant wall thickening and colon. Scattered diverticula are seen in the colon without signs of focal acute diverticulitis. Vascular/Lymphatic: Arterial calcifications are seen in aorta and its major branches. Reproductive: Prostate is not seen, possibly suggesting previous intervention. Other: Small ascites is present. There is no pneumoperitoneum. There is previous ventral hernia repair. Musculoskeletal: Lumbar spondylosis with spinal stenosis and encroachment of neural foramina at multiple levels. IMPRESSION: Gallbladder stone. Gallbladder is distended along with wall thickening and pericholecystic stranding. Possibility of acute cholecystitis is not excluded. Follow-up gallbladder sonogram should be considered. There is no evidence of intestinal obstruction or pneumoperitoneum. There is no hydronephrosis. Small  bilateral pleural effusions with interval increase. Small to moderate pericardial effusion. There are patchy infiltrates with air bronchogram in the lower lung fields suggesting atelectasis/pneumonia with interval worsening. Severe coronary artery disease. Diverticulosis of colon without signs of focal diverticulitis. Bilateral renal cysts with no significant interval change. No follow-up studies are recommended. Lumbar spondylosis. Other findings as described in the body of the report. These results will be called to the ordering clinician or representative by the Radiologist Assistant, and communication documented in the PACS or [employer] Electronically Signed   By: PElmer PickerM.D.   On: 05/21/2022 14:04   DG Abd Portable 1V  Result Date: 05/21/2022 CLINICAL DATA:  Abdominal pain EXAM: PORTABLE ABDOMEN - 1 VIEW COMPARISON:  05/20/2022 FINDINGS: The bowel gas pattern is nonobstructive. No gross free intraperitoneal air. No radio-opaque calculi or other significant radiographic abnormality are seen. IMPRESSION: Nonobstructive bowel gas pattern. Electronically Signed   By: NDavina PokeD.O.   On: 05/21/2022 08:49   [place]KG SITE RITE  Result Date: 05/20/2022 If Site Rite image not attached, placement could not be confirmed due to current cardiac rhythm.  DG CHEST PORT 1 VIEW  Result Date: 05/20/2022 CLINICAL DATA:  Abdominal pain EXAM: PORTABLE CHEST 1 VIEW COMPARISON:  Two days ago FINDINGS: Chronic cardiomegaly. Prior CABG. Under penetrated bases due to soft tissue attenuation. Blunting at the lateral left costophrenic  sulcus from scarring/fat based on recent abdominal CT. There is no edema, consolidation, or pneumothorax. IMPRESSION: Limited portable study without acute finding. Electronically Signed   By: Jorje Guild M.D.   On: 05/20/2022 07:29   DG Abd 1 View  Result Date: 05/20/2022 CLINICAL DATA:  Abdominal pain. EXAM: ABDOMEN - 1 VIEW COMPARISON:  05/20/2022 gas noted  FINDINGS: The bowel gas pattern is normal. Throughout the colon up to the level of the rectum. Signs of previous ventral abdominal wall herniorrhaphy. No radio-opaque calculi or other significant radiographic abnormality are seen. IMPRESSION: Nonobstructive bowel gas pattern. Electronically Signed   By: Kerby Moors M.D.   On: 05/20/2022 07:29    Labs:  CBC: Recent Labs    05/20/22 1636 05/21/22 0354 05/22/22 0424 05/23/22 0918  WBC 24.6* 26.9* 33.4* 20.9*  HGB 11.6* 11.6* 11.9* 11.3*  HCT 33.5* 35.0* 35.2* 34.1*  PLT 282 267 284 299    COAGS: Recent Labs    05/03/22 0857 05/07/22 1336 05/22/22 0757  INR 1.0 1.4* 2.1*  APTT 30 32  --     BMP: Recent Labs    05/20/22 1636 05/21/22 0354 05/22/22 0424 05/23/22 0425  NA 138 137 134* 134*  K 3.2* 3.1* 3.5 3.4*  CL 103 105 103 102  CO2 25 24 23 23   GLUCOSE 221* 258* 121* 94  BUN 19 15 17 21   CALCIUM 8.6* 8.4* 8.4* 8.2*  CREATININE 1.27* 1.23 1.35* 1.47*  GFRNONAA 58* >60 54* 49*    LIVER FUNCTION TESTS: Recent Labs    05/03/22 0857 05/21/22 0354 05/22/22 0424 05/23/22 0425  BILITOT 2.8* 4.1* 4.3* 3.3*  AST 28 106* 71* 62*  ALT 20 61* 53* 41  ALKPHOS 46 104 93 93  PROT 6.1* 5.7* 5.4* 5.4*  ALBUMIN 3.1* 2.4* 2.2* 1.9*    Assessment and Plan:   Acute cholecystitis s/p cholecystostomy tube placement with Dr. Dwaine Gale 05/22/22.  Pt sitting upright in recliner. He is A&O. He states he is feeling much better. He denies fever, chills, N/V. He endorses severe fatigue. RN at bedsides states pt is able to ambulate in hall with walker.   Drain flushes/aspirates easily. Site unremarkable with sutures/statlock in place. Scant amt dark red, bloody OP in gravity bag. Dressing C/D/I.    WBC 20.9 (33.4), afebrile T bili 3.3 (4.3)  Continue to flush drain with 5 cc NS TID. Record OP q shift.   Change dressing q shift or as needed to keep clean and dry.  Contact IR if difficulty flushing or if there is a sudden change in  OP.  Pt to f/u in IR clinic 10-14 days after d/c for evaluation with repeat imaging or drain injection.     Electronically Signed: Tyson Alias, NP 05/23/2022, 3:15 PM   I spent a total of 15 Minutes at the the patient's bedside AND on the patient's hospital floor or unit, greater than 50% of which was counseling/coordinating care for acute cholecystitis.

## 2022-05-23 NOTE — Progress Notes (Signed)
Patient ID: Luis Deleon, male   DOB: [birth year], 81 y.o.   MRN: [id number] TCTS Evening Rounds:  Hemodynamically stable on low dose neo.  Sats 93% on 2L [place].  UO ok  Feels better since GB drained.

## 2022-05-23 NOTE — Evaluation (Signed)
Physical Therapy Evaluation Patient Details Name: Luis Deleon MRN: [id number] DOB: 28-Jan-1941 Today's Date: 05/23/2022  History of Present Illness  Pt is a 81 y.o. male s/p recent CABG (05/07/22; d/c home 05/17/22), now admitted 8/4/823 with weakness, hypotension, SOB. Workup for R thigh cellulitis, small L pleural effusion. Rapid response 8/5 with hypotension and chest pain; transfer to ICU 8/6 for closer observation. Pt with RUQ pain; abdominal CT 8/7 with acute calculous cholecystitis. S/p cholecystostomy with drain placement 8/8. PMH includes CAD, CKD, HTN, arthritis, gout, Crohn disease, prostate CA.   Clinical Impression  Pt presents with an overall decrease in functional mobility secondary to above. At baseline, pt mod indep ambulating with SPC, lives with wife who he assists with ADL/iADLs; since recent CABG, pt ambulating with RW and has hired assist to help him and wife at home while he recovers. Today, pt moving fairly well with up to minA for mobility, intermittent cues for sternal precautions. Pt would benefit from continued acute PT services to maximize functional mobility and independence prior to d/c with HHPT services.      Recommendations for follow up therapy are one component of a multi-disciplinary discharge planning process, led by the attending physician.  Recommendations may be updated based on patient status, additional functional criteria and insurance authorization.  Follow Up Recommendations Home health PT      Assistance Recommended at Discharge Intermittent Supervision/Assistance  Patient can return home with the following  A little help with bathing/dressing/bathroom;Assistance with cooking/housework;Assist for transportation;Help with stairs or ramp for entrance    Equipment Recommendations None recommended by PT  Recommendations for Other Services   Occupational Therapy   Functional Status Assessment Patient has had a recent decline in their functional status  and demonstrates the ability to make significant improvements in function in a reasonable and predictable amount of time.     Precautions / Restrictions Precautions Precautions: Sternal;Fall;Other (comment) Precaution Comments: R perc chole drain Restrictions RUE Weight Bearing: Partial weight bearing RUE Partial Weight Bearing Percentage or Pounds: Sternal pre4cautions LUE Weight Bearing: Partial weight bearing LUE Partial Weight Bearing Percentage or Pounds: sternal precautions      Mobility  Bed Mobility Overal bed mobility: Needs Assistance Bed Mobility: Rolling, Sidelying to Sit Rolling: Min assist Sidelying to sit: Min assist       General bed mobility comments: pt with good awareness of sternal precautions with min cues; minA for trunk elevation    Transfers Overall transfer level: Needs assistance Equipment used: Rolling walker (2 wheels) Transfers: Sit to/from Stand Sit to Stand: Min assist, Min guard           General transfer comment: cues for hand placement and use of momentum, pt requiring initial minA for trunk elevation and stability standing from EOB; additional sit<>stand from recliner with min guard, improved technique    Ambulation/Gait Ambulation/Gait assistance: Min guard Gait Distance (Feet): 150 Feet Assistive device: Rolling walker (2 wheels) Gait Pattern/deviations: Step-through pattern, Decreased stride length, Trunk flexed Gait velocity: Decreased     General Gait Details: slow, mostly steady gait with RW and min guard for balance; pt endorses LE soreness and fatigue  Stairs            Wheelchair Mobility    Modified Rankin (Stroke Patients Only)       Balance Overall balance assessment: Needs assistance Sitting-balance support: No upper extremity supported, Feet supported Sitting balance-Leahy Scale: Fair     Standing balance support: No upper extremity supported, During functional  activity Standing balance-Leahy Scale:  Fair Standing balance comment: can static stand without UE support; static and dynamic stability improved with RW                             Pertinent Vitals/Pain Pain Assessment Pain Assessment: Faces Faces Pain Scale: Hurts little more Pain Location: RLE, sternal incision Pain Descriptors / Indicators: Sore, Discomfort Pain Intervention(s): Monitored during session, Limited activity within patient's tolerance    Home Living Family/patient expects to be discharged to:: Private residence Living Arrangements: Spouse/significant other Available Help at Discharge: Family;Personal care attendant;Available PRN/intermittently   Home Access: Stairs to enter       Home Layout: One level Home Equipment: Conservation officer, nature (2 wheels);Cane - single point Additional Comments: Lives with wife who ambulates with RW, but pt performs household tasks and helps care for wife; reports hiring woman to assist while he recovers    Prior Function Prior Level of Function : Independent/Modified Independent             Mobility Comments: typically mod indep with SPC; since CABG, has been ambulating with RW. ADLs Comments: baseline stands to shower, but has done sponge bathing since CABG. baseline assists wife with ADL/iADLs     Hand Dominance        Extremity/Trunk Assessment   Upper Extremity Assessment Upper Extremity Assessment: Overall WFL for tasks assessed    Lower Extremity Assessment Lower Extremity Assessment: Generalized weakness    Cervical / Trunk Assessment Cervical / Trunk Assessment: Other exceptions Cervical / Trunk Exceptions: s/p recent CABG  Communication   Communication: HOH  Cognition Arousal/Alertness: Awake/alert Behavior During Therapy: WFL for tasks assessed/performed Overall Cognitive Status: Within Functional Limits for tasks assessed                                 General Comments: WFL for simple tasks; took a while to wake up and  process discussion, but more alert and cognitively intact once mobilizing; good awareness of sternal precautions from prior education with intermittent cues        General Comments General comments (skin integrity, edema, etc.): unable to get reliable pulse ox reading with ambulation; SpO2 90-94% on RA at rest, HR 94, post-mobility BP 105/80    Exercises     Assessment/Plan    PT Assessment Patient needs continued PT services  PT Problem List Decreased strength;Decreased activity tolerance;Decreased balance;Decreased mobility;Decreased knowledge of precautions;Cardiopulmonary status limiting activity;Pain       PT Treatment Interventions DME instruction;Gait training;Stair training;Functional mobility training;Therapeutic activities;Therapeutic exercise;Balance training;Patient/family education    PT Goals (Current goals can be found in the Care Plan section)  Acute Rehab PT Goals Patient Stated Goal: increased strength, get better PT Goal Formulation: With patient Time For Goal Achievement: 06/06/22 Potential to Achieve Goals: Good    Frequency Min 3X/week     Co-evaluation               AM-PAC PT "6 Clicks" Mobility  Outcome Measure Help needed turning from your back to your side while in a flat bed without using bedrails?: A Little Help needed moving from lying on your back to sitting on the side of a flat bed without using bedrails?: A Little Help needed moving to and from a bed to a chair (including a wheelchair)?: A Little Help needed standing up from a chair using your  arms (e.g., wheelchair or bedside chair)?: A Little Help needed to walk in hospital room?: A Little Help needed climbing 3-5 steps with a railing? : A Little 6 Click Score: 18    End of Session Equipment Utilized During Treatment: Gait belt Activity Tolerance: Patient tolerated treatment well Patient left: in chair;with call bell/phone within reach;with nursing/sitter in room Nurse  Communication: Mobility status PT Visit Diagnosis: Other abnormalities of gait and mobility (R26.89);Muscle weakness (generalized) (M62.81)    Time: 4171-2787 PT Time Calculation (min) (ACUTE ONLY): 24 min   Charges:   PT Evaluation $PT Eval Moderate Complexity: [place], PT, DPT Acute Rehabilitation Services  Personal: [place] [facility] Office: [place] 05/23/2022, 3:29 PM

## 2022-05-23 NOTE — Progress Notes (Signed)
  Subjective: Abdominal pain better Tired after getting up to chair and bathroom  Objective: Vital signs in last 24 hours: Temp:  [97.5 F (36.4 C)-98.9 F (37.2 C)] 98 F (36.7 C) (08/09 0724) Pulse Rate:  [82-98] 87 (08/09 0700) Cardiac Rhythm: Sinus tachycardia (08/09 0600) Resp:  [16-31] 26 (08/09 0700) BP: (74-129)/(48-73) 115/61 (08/09 0700) SpO2:  [92 %-97 %] 95 % (08/09 0700) Weight:  [102.5 kg] 102.5 kg (08/09 0600)  Hemodynamic parameters for last 24 hours:    Intake/Output from previous day: 08/08 0701 - 08/09 0700 In: 2718.5 [P.O.:240; I.V.:1841.7; Blood:309.2; IV Piggyback:312.6] Out: 950 [Urine:550; Drains:400] Intake/Output this shift: Total I/O In: 240 [P.O.:240] Out: -   General appearance: alert, cooperative, and no distress Neurologic: intact Heart: regular rate and rhythm Lungs: diminished breath sounds bibasilar Abdomen: nontender Wound: right thigh with mi Bile in chole drain Right thigh- mild erythema, seroma mildly decreased Lab Results: Recent Labs    05/21/22 0354 05/22/22 0424  WBC 26.9* 33.4*  HGB 11.6* 11.9*  HCT 35.0* 35.2*  PLT 267 284   BMET:  Recent Labs    05/22/22 0424 05/23/22 0425  NA 134* 134*  K 3.5 3.4*  CL 103 102  CO2 23 23  GLUCOSE 121* 94  BUN 17 21  CREATININE 1.35* 1.47*  CALCIUM 8.4* 8.2*    PT/INR:  Recent Labs    05/22/22 0757  LABPROT 23.3*  INR 2.1*   ABG    Component Value Date/Time   PHART 7.478 (H) 05/20/2022 0749   HCO3 23.9 05/20/2022 0749   TCO2 25 05/20/2022 0749   ACIDBASEDEF 6.0 (H) 05/07/2022 2302   O2SAT 95 05/20/2022 0749   CBG (last 3)  Recent Labs    05/22/22 2311 05/23/22 0326 05/23/22 0715  GLUCAP 97 92 85    Assessment/Plan: S/P  NEURO- intact CV- in Sr on IV amiodarone, convert to PO  Resume Eliquis tomorrow if Ok with GS RESP- IS for atelectasis, recheck CXR tomorrow RENAL- creatinine up slightly  Continue IVF, check PVR to r/o retention   Monitor Uo  and labs  Supplement K ENDO- CBG well controlled GI- abdominal pain resolved with perc chole drain  Tolerating POs ID- on Vanco for cellulitis right thigh  Zosyn for acute cholecystitis Deconditioning - PT consult  LOS: 5 days    Luis Deleon 05/23/2022

## 2022-05-23 NOTE — Progress Notes (Addendum)
Subjective/Chief Complaint: Abd pain improved s/p perc drain. Tolerating PO but not eating much. Denies nausea/vomiting.   Objective: Vital signs in last 24 hours: Temp:  [97.5 F (36.4 C)-98.9 F (37.2 C)] 98 F (36.7 C) (08/09 0724) Pulse Rate:  [82-98] 87 (08/09 0700) Resp:  [16-31] 26 (08/09 0700) BP: (74-129)/(48-73) 115/61 (08/09 0700) SpO2:  [92 %-97 %] 95 % (08/09 0700) Weight:  [102.5 kg] 102.5 kg (08/09 0600) Last BM Date : 05/21/22  Intake/Output from previous day: 08/08 0701 - 08/09 0700 In: 2718.5 [P.O.:240; I.V.:1841.7; Blood:309.2; IV Piggyback:312.6] Out: 950 [Urine:550; Drains:400] Intake/Output this shift: Total I/O In: 240 [P.O.:240] Out: -   PE:  Constitutional: No acute distress, conversant, appears states age. Eyes: Anicteric sclerae, moist conjunctiva, no lid lag Lungs: slightly labored on [place] CV: regular rate and rhythm GI: Soft, nontender, RUQ perc drain with dark bilious and sanguinous effluent. Skin: No rashes, palpation reveals normal turgor Psychiatric: appropriate judgment and insight, oriented to person, place, and time   Lab Results:  Recent Labs    05/21/22 0354 05/22/22 0424  WBC 26.9* 33.4*  HGB 11.6* 11.9*  HCT 35.0* 35.2*  PLT 267 284   BMET Recent Labs    05/22/22 0424 05/23/22 0425  NA 134* 134*  K 3.5 3.4*  CL 103 102  CO2 23 23  GLUCOSE 121* 94  BUN 17 21  CREATININE 1.35* 1.47*  CALCIUM 8.4* 8.2*   PT/INR Recent Labs    05/22/22 0757  LABPROT 23.3*  INR 2.1*   ABG No results for input(s): "PHART", "HCO3" in the last 72 hours.  Invalid input(s): "PCO2", "PO2"   Studies/Results: IR Perc Cholecystostomy  Result Date: 05/22/2022 INDICATION: 81 year old gentleman with acute calculus cholecystitis presents to IR for cholecystostomy drain placement. EXAM: Ultrasound and fluoroscopy guided cholecystostomy drain placement MEDICATIONS: Patient currently on inpatient regimen of broad-spectrum antibiotics.  ANESTHESIA/SEDATION: Moderate (conscious) sedation was employed during this procedure. A total of Versed 0.5 mg and Fentanyl 25 mcg was administered intravenously. Moderate Sedation Time: 10 minutes. The patient's level of consciousness and vital signs were monitored continuously by radiology nursing throughout the procedure under my direct supervision. FLUOROSCOPY TIME:  Radiation Exposure Index (as provided by the fluoroscopic device): 8 mGy Kerma COMPLICATIONS: None immediate. PROCEDURE: Informed written consent was obtained from the patient after a thorough discussion of the procedural risks, benefits and alternatives. All questions were addressed. Maximal Sterile Barrier Technique was utilized including caps, mask, sterile gowns, sterile gloves, sterile drape, hand hygiene and skin antiseptic. A timeout was performed prior to the initiation of the procedure. Patient positioned supine on the angiography table. Right upper quadrant abdominal skin prepped and draped in the usual sterile fashion. Sterile probe cover and sterile gel utilized throughout the procedure. Using ultrasound and fluoroscoping guidance, the gallbladder was accessed was accessed with a 21 ga needle. Contrast administered through the needle confirmed appropriate access within the gallbladder lumen. 0.018 inch guidewire advanced into the gallbladder lumen and the needle was exchanged for a transitional dilator set. Percutaneous contrast administration demonstrated appropriate access the gallbladder lumen. Filling defect consistent with gallstone. 10.2 fr multipurpose pigtail drain was inserted over the guidewire. The catheter tip was positioned in the gallbladder lumen. The catheter was secured to skin with suture and connected to bag. IMPRESSION: 10.2 French cholecystostomy drain placed using fluoroscopic and ultrasound guidance. PLAN: Follow-up in 6-8 weeks in IR clinic for cholangiogram. Electronically Signed   By: Miachel Roux M.D.   On:  05/22/2022  16:16   DG Chest Port 1 View  Result Date: 05/22/2022 CLINICAL DATA:  21224; this of breath EXAM: PORTABLE CHEST 1 VIEW COMPARISON:  Radiograph dated May 20, 2022 FINDINGS: The cardiomediastinal silhouette is unchanged in contour.Status post median sternotomy and CABG. Elevation of the RIGHT hemidiaphragm, unchanged. RIGHT upper extremity PICC tip terminates over the superior cavoatrial junction. Atherosclerotic calcifications of the aorta. Small bilateral pleural effusions. No pneumothorax. LEFT retrocardiac opacity, likely atelectasis. Visualized abdomen is unremarkable. IMPRESSION: Small bilateral pleural effusions and favored LEFT basilar atelectasis. Electronically Signed   By: Valentino Saxon M.D.   On: 05/22/2022 08:02   CT ABDOMEN PELVIS W CONTRAST  Result Date: 05/21/2022 CLINICAL DATA:  Right-sided abdominal pain EXAM: CT ABDOMEN AND PELVIS WITH CONTRAST TECHNIQUE: Multidetector CT imaging of the abdomen and pelvis was performed using the standard protocol following bolus administration of intravenous contrast. RADIATION DOSE REDUCTION: This exam was performed according to the departmental dose-optimization program which includes automated exposure control, adjustment of the mA and/or kV according to patient size and/or use of iterative reconstruction technique. CONTRAST:  110m OMNIPAQUE IOHEXOL 350 MG/ML SOLN COMPARISON:  05/23/2022 FINDINGS: Lower chest: Small bilateral pleural effusions are seen with interval increase in size. There are patchy infiltrates in the posterior lower lung fields with interval worsening. Coronary artery calcifications are seen. Metallic suture is seen in sternum suggest previous coronary bypass surgery. Small to moderate pericardial effusion is seen. Hepatobiliary: There is no dilation of bile ducts. Gallbladder is distended. There is wall thickening in gallbladder. There is mild edema in the pericholecystic region. There is calcified gallbladder stone.  Pancreas: No focal abnormalities are seen. Spleen: Unremarkable. Adrenals/Urinary Tract: Adrenals are unremarkable. There is no hydronephrosis. There are multiple smooth marginated fluid density lesions in both kidneys measuring up to 10 mm in maximum diameter with no significant change. There is 1 mm calcific density in the lower pole of left kidney. Ureters are not dilated. Urinary bladder is not distended. Stomach/Bowel: Stomach is not distended. Small bowel loops are not dilated. The appendix is not seen. There is no significant wall thickening and colon. Scattered diverticula are seen in the colon without signs of focal acute diverticulitis. Vascular/Lymphatic: Arterial calcifications are seen in aorta and its major branches. Reproductive: Prostate is not seen, possibly suggesting previous intervention. Other: Small ascites is present. There is no pneumoperitoneum. There is previous ventral hernia repair. Musculoskeletal: Lumbar spondylosis with spinal stenosis and encroachment of neural foramina at multiple levels. IMPRESSION: Gallbladder stone. Gallbladder is distended along with wall thickening and pericholecystic stranding. Possibility of acute cholecystitis is not excluded. Follow-up gallbladder sonogram should be considered. There is no evidence of intestinal obstruction or pneumoperitoneum. There is no hydronephrosis. Small bilateral pleural effusions with interval increase. Small to moderate pericardial effusion. There are patchy infiltrates with air bronchogram in the lower lung fields suggesting atelectasis/pneumonia with interval worsening. Severe coronary artery disease. Diverticulosis of colon without signs of focal diverticulitis. Bilateral renal cysts with no significant interval change. No follow-up studies are recommended. Lumbar spondylosis. Other findings as described in the body of the report. These results will be called to the ordering clinician or representative by the Radiologist  Assistant, and communication documented in the PACS or [employer] Electronically Signed   By: PElmer PickerM.D.   On: 05/21/2022 14:04    Anti-infectives: Anti-infectives (From admission, onward)    Start     Dose/Rate Route Frequency Ordered Stop   05/23/22 0600  ceFAZolin (ANCEF) IVPB 2g/100 mL premix  2 g 200 mL/hr over 30 Minutes Intravenous On call to O.R. 05/22/22 1417 05/30/2022 0559   05/22/22 1400  ceFAZolin (ANCEF) IVPB 2g/100 mL premix  Status:  Discontinued        2 g 200 mL/hr over 30 Minutes Intravenous Every 8 hours 05/22/22 1202 05/22/22 1417   05/20/22 1700  vancomycin (VANCOREADY) IVPB 1250 mg/250 mL  Status:  Discontinued        1,250 mg 166.7 mL/hr over 90 Minutes Intravenous Every 48 hours 06/08/2022 1643 05/20/22 1012   05/20/22 1400  piperacillin-tazobactam (ZOSYN) IVPB 3.375 g        3.375 g 12.5 mL/hr over 240 Minutes Intravenous Every 8 hours 05/20/22 1003     05/20/22 1100  vancomycin (VANCOREADY) IVPB 750 mg/150 mL        750 mg 150 mL/hr over 60 Minutes Intravenous Every 24 hours 05/20/22 1012 05/29/2022 2359   05/23/2022 1645  vancomycin (VANCOREADY) IVPB 1750 mg/350 mL        1,750 mg 175 mL/hr over 120 Minutes Intravenous  Once 05/22/2022 1643 06/05/2022 2000   05/15/2022 1630  ceFAZolin (ANCEF) IVPB 1 g/50 mL premix        1 g 100 mL/hr over 30 Minutes Intravenous  Once 05/20/2022 1616 06/14/2022 1720       Assessment/Plan: Acute calculous cholecystitis - afebrile, he currently is hemodynamically stable, WBC pending - s/p IR perc chole tube 8/8, GS w/ NGTD. Currently on Zosyn. Would recommend Augmentin for 7-10 days on discharge. - LFTs downtrending, including bilirubin, no role for MRCP  - CCS will sign off, I will arrange outpatient follow up with Dr. Rosendo Gros in about 4 weeks to discuss interval cholecystectomy.      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

## 2022-05-24 ENCOUNTER — Inpatient Hospital Stay (HOSPITAL_COMMUNITY): Payer: Medicare HMO

## 2022-05-24 DIAGNOSIS — L03115 Cellulitis of right lower limb: Secondary | ICD-10-CM | POA: Diagnosis not present

## 2022-05-24 LAB — CBC
HCT: 33.2 % — ABNORMAL LOW (ref 39.0–52.0)
Hemoglobin: 11 g/dL — ABNORMAL LOW (ref 13.0–17.0)
MCH: 31.8 pg (ref 26.0–34.0)
MCHC: 33.1 g/dL (ref 30.0–36.0)
MCV: 96 fL (ref 80.0–100.0)
Platelets: 335 10*3/uL (ref 150–400)
RBC: 3.46 MIL/uL — ABNORMAL LOW (ref 4.22–5.81)
RDW: 16.2 % — ABNORMAL HIGH (ref 11.5–15.5)
WBC: 12.2 10*3/uL — ABNORMAL HIGH (ref 4.0–10.5)
nRBC: 0 % (ref 0.0–0.2)

## 2022-05-24 LAB — BASIC METABOLIC PANEL
Anion gap: 8 (ref 5–15)
BUN: 21 mg/dL (ref 8–23)
CO2: 22 mmol/L (ref 22–32)
Calcium: 8.3 mg/dL — ABNORMAL LOW (ref 8.9–10.3)
Chloride: 103 mmol/L (ref 98–111)
Creatinine, Ser: 1.4 mg/dL — ABNORMAL HIGH (ref 0.61–1.24)
GFR, Estimated: 51 mL/min — ABNORMAL LOW (ref >60)
Glucose, Bld: 79 mg/dL (ref 70–99)
Potassium: 3.4 mmol/L — ABNORMAL LOW (ref 3.5–5.1)
Sodium: 133 mmol/L — ABNORMAL LOW (ref 135–145)

## 2022-05-24 LAB — GLUCOSE, CAPILLARY
Glucose-Capillary: 104 mg/dL — ABNORMAL HIGH (ref 70–99)
Glucose-Capillary: 112 mg/dL — ABNORMAL HIGH (ref 70–99)
Glucose-Capillary: 124 mg/dL — ABNORMAL HIGH (ref 70–99)
Glucose-Capillary: 71 mg/dL (ref 70–99)
Glucose-Capillary: 81 mg/dL (ref 70–99)
Glucose-Capillary: 91 mg/dL (ref 70–99)
Glucose-Capillary: 96 mg/dL (ref 70–99)

## 2022-05-24 LAB — C-REACTIVE PROTEIN: CRP: 18.6 mg/dL — ABNORMAL HIGH (ref <1.0)

## 2022-05-24 MED ORDER — MIDODRINE HCL 5 MG PO TABS
5.0000 mg | ORAL_TABLET | Freq: Three times a day (TID) | ORAL | Status: DC
  Administered 2022-05-24 – 2022-05-27 (×11): 5 mg via ORAL
  Filled 2022-05-24 (×11): qty 1

## 2022-05-24 MED ORDER — PROSOURCE PLUS PO LIQD
30.0000 mL | Freq: Two times a day (BID) | ORAL | Status: DC
Start: 2022-05-24 — End: 2022-05-28
  Administered 2022-05-24 – 2022-05-27 (×7): 30 mL via ORAL
  Filled 2022-05-24 (×8): qty 30

## 2022-05-24 MED ORDER — POTASSIUM CHLORIDE 10 MEQ/50ML IV SOLN
10.0000 meq | INTRAVENOUS | Status: AC
  Administered 2022-05-24 (×4): 10 meq via INTRAVENOUS
  Filled 2022-05-24 (×4): qty 50

## 2022-05-24 MED ORDER — ZINC SULFATE 220 (50 ZN) MG PO CAPS
220.0000 mg | ORAL_CAPSULE | Freq: Every day | ORAL | Status: DC
  Administered 2022-05-24 – 2022-05-27 (×4): 220 mg via ORAL
  Filled 2022-05-24 (×4): qty 1

## 2022-05-24 MED ORDER — APIXABAN 5 MG PO TABS
5.0000 mg | ORAL_TABLET | Freq: Two times a day (BID) | ORAL | Status: DC
  Administered 2022-05-24 – 2022-05-27 (×7): 5 mg via ORAL
  Filled 2022-05-24 (×7): qty 1

## 2022-05-24 NOTE — Evaluation (Signed)
Occupational Therapy Evaluation Patient Details Name: Luis Deleon MRN: [id number] DOB: [birth year] Today's Date: 05/20/2022   History of Present Illness Pt is a 81 y.o. male s/p recent CABG (05/07/22; d/c home 05/17/22), now admitted 8/4/823 with weakness, hypotension, SOB. Workup for R thigh cellulitis, small L pleural effusion. Rapid response 8/5 with hypotension and chest pain; transfer to ICU 8/6 for closer observation. Pt with RUQ pain; abdominal CT 8/7 with acute calculous cholecystitis. S/p cholecystostomy with drain placement 8/8. PMH includes CAD, CKD, HTN, arthritis, gout, Crohn disease, prostate CA.   Clinical Impression   Patient admitted for the diagnosis above.  PTA he lives at home with his spouse, whom he cares for, and does have support from family.  Patient with recent CABG, and needing cues for precautions.  Currently he is is needing assist with mobility, up to [place] and Mod A for lower body ADL.  OT can follow in the acute setting with [facility] OT recommended once discharged.        Recommendations for follow up therapy are one component of a multi-disciplinary discharge planning process, led by the attending physician.  Recommendations may be updated based on patient status, additional functional criteria and insurance authorization.   Follow Up Recommendations  Home health OT    Assistance Recommended at Discharge Intermittent Supervision/Assistance  Patient can return home with the following A little help with bathing/dressing/bathroom;Assist for transportation;Assistance with cooking/housework    Functional Status Assessment  Patient has had a recent decline in their functional status and demonstrates the ability to make significant improvements in function in a reasonable and predictable amount of time.  Equipment Recommendations  None recommended by OT    Recommendations for Other Services       Precautions / Restrictions Precautions Precautions: Sternal;Fall;Other  (comment) Precaution Comments: R perc chole drain Restrictions Weight Bearing Restrictions: Yes RUE Weight Bearing: Partial weight bearing LUE Weight Bearing: Partial weight bearing      Mobility Bed Mobility Overal bed mobility: Needs Assistance Bed Mobility: Supine to Sit, Sit to Supine     Supine to sit: Min assist Sit to supine: Min assist, Mod assist        Transfers Overall transfer level: Needs assistance Equipment used: Rolling walker (2 wheels) Transfers: Sit to/from Stand Sit to Stand: Min assist                  Balance Overall balance assessment: Needs assistance Sitting-balance support: No upper extremity supported, Feet supported Sitting balance-Leahy Scale: Good     Standing balance support: Reliant on assistive device for balance Standing balance-Leahy Scale: Fair                             ADL either performed or assessed with clinical judgement   ADL       Grooming: Wash/dry hands;Wash/dry face;Set up;Sitting           Upper Body Dressing : Minimal assistance;Sitting   Lower Body Dressing: Moderate assistance;Sit to/from stand   Toilet Transfer: Min guard;Rolling walker (2 wheels);Ambulation                   Vision Baseline Vision/History: 1 Wears glasses Patient Visual Report: No change from baseline       Perception Perception Perception: Within Functional Limits   Praxis Praxis Praxis: Intact    Pertinent Vitals/Pain Pain Assessment Pain Assessment: Faces Faces Pain Scale: Hurts little more Pain Location:  RLE, sternal incision Pain Descriptors / Indicators: Discomfort, Radiating Pain Intervention(s): Monitored during session     Hand Dominance Right   Extremity/Trunk Assessment Upper Extremity Assessment Upper Extremity Assessment: Overall WFL for tasks assessed   Lower Extremity Assessment Lower Extremity Assessment: Defer to PT evaluation       Communication  Communication Communication: HOH   Cognition Arousal/Alertness: Awake/alert Behavior During Therapy: WFL for tasks assessed/performed Overall Cognitive Status: Within Functional Limits for tasks assessed                                                        Home Living Family/patient expects to be discharged to:: Private residence Living Arrangements: Spouse/significant other Available Help at Discharge: Family;Personal care attendant;Available PRN/intermittently Type of Home: House Home Access: Stairs to enter     Home Layout: One level     Bathroom Shower/Tub: Teacher, early years/pre: Standard     Home Equipment: Conservation officer, nature (2 wheels);Cane - single point          Prior Functioning/Environment Prior Level of Function : Independent/Modified Independent             Mobility Comments: typically mod indep with SPC; since CABG, has been ambulating with RW. ADLs Comments: baseline stands to shower, but has done sponge bathing since CABG. baseline assists wife with ADL/iADLs, continues to drive.  No longer cuts his grass.        OT Problem List: Decreased activity tolerance;Impaired balance (sitting and/or standing);Decreased strength;Pain      OT Treatment/Interventions: Self-care/ADL training;Therapeutic exercise;Therapeutic activities;Balance training;DME and/or AE instruction;Patient/family education    OT Goals(Current goals can be found in the care plan section) Acute Rehab OT Goals Patient Stated Goal: Return home OT Goal Formulation: With patient Time For Goal Achievement: 06/07/22 Potential to Achieve Goals: Good ADL Goals Pt Will Perform Grooming: with set-up;standing Pt Will Perform Upper Body Dressing: with set-up;sitting Pt Will Perform Lower Body Dressing: with min assist;sit to/from stand Pt Will Transfer to Toilet: with modified independence;ambulating;regular height toilet Pt/caregiver will Perform Home  Exercise Program: Increased strength;Both right and left upper extremity;With Supervision  OT Frequency: Min 2X/week    Co-evaluation              AM-PAC OT "6 Clicks" Daily Activity     Outcome Measure Help from another person eating meals?: None Help from another person taking care of personal grooming?: None Help from another person toileting, which includes using toliet, bedpan, or urinal?: A Little Help from another person bathing (including washing, rinsing, drying)?: A Lot Help from another person to put on and taking off regular upper body clothing?: A Little Help from another person to put on and taking off regular lower body clothing?: A Lot 6 Click Score: 18   End of Session Equipment Utilized During Treatment: Rolling walker (2 wheels) Nurse Communication: Mobility status  Activity Tolerance: Patient tolerated treatment well Patient left: in bed;with call bell/phone within reach  OT Visit Diagnosis: Unsteadiness on feet (R26.81)                Time: 1335-1405 OT Time Calculation (min): 30 min Charges:  OT General Charges $OT Visit: 1 Visit OT Evaluation $OT Eval Moderate Complexity: 1 Mod OT Treatments $Self Care/Home Management : 8-22 mins  06/08/2022  RP, OTR/L  Acute Rehabilitation Services  Office:  [phone]   Metta Clines 05/23/2022, 2:17 PM

## 2022-05-24 NOTE — Progress Notes (Addendum)
  Subjective: Feels well, ambulated yesterday Denies abdominal pain  Objective: Vital signs in last 24 hours: Temp:  [97.5 F (36.4 C)-98.6 F (37 C)] 98.6 F (37 C) (08/10 0728) Pulse Rate:  [75-99] 81 (08/10 0600) Cardiac Rhythm: Normal sinus rhythm (08/10 0400) Resp:  [9-40] 24 (08/10 0600) BP: (64-128)/(15-85) 112/55 (08/10 0600) SpO2:  [91 %-98 %] 97 % (08/10 0600) Weight:  [108 kg] 108 kg (08/10 0500)  Hemodynamic parameters for last 24 hours:    Intake/Output from previous day: 08/09 0701 - 08/10 0700 In: 2832.1 [P.O.:360; I.V.:2024.7; IV Piggyback:437.4] Out: 655 [Urine:500; Drains:155] Intake/Output this shift: No intake/output data recorded.  General appearance: alert, cooperative, and no distress Neurologic: intact and generally weak/ deconditioned Heart: regular rate and rhythm Lungs: diminished breath sounds bibasilar Abdomen: normal findings: soft, non-tender and bilious drainage Extremities: RLE cellulitis/ seroma Wound: sternal wound intact, sternum stable  Lab Results: Recent Labs    05/23/22 0918 06/09/2022 0520  WBC 20.9* 12.2*  HGB 11.3* 11.0*  HCT 34.1* 33.2*  PLT 299 335   BMET:  Recent Labs    05/23/22 0425 06/05/2022 0520  NA 134* 133*  K 3.4* 3.4*  CL 102 103  CO2 23 22  GLUCOSE 94 79  BUN 21 21  CREATININE 1.47* 1.40*  CALCIUM 8.2* 8.3*    PT/INR:  Recent Labs    05/22/22 0757  LABPROT 23.3*  INR 2.1*   ABG    Component Value Date/Time   PHART 7.478 (H) 05/20/2022 0749   HCO3 23.9 05/20/2022 0749   TCO2 25 05/20/2022 0749   ACIDBASEDEF 6.0 (H) 05/07/2022 2302   O2SAT 95 05/20/2022 0749   CBG (last 3)  Recent Labs    06/08/2022 0017 05/17/2022 0446 06/05/2022 0726  GLUCAP 81 91 71    Assessment/Plan: NEURO- intact CV- in Sr, on low dose neosynephrine drip for hypotension  Will add midodrine, wean neo as tolerated  Bp meds on hold RESP- continue IS RENAL- creatinine stable  Hypokalemia- supplement K ENDO- CBG  well controlled GI- PO intake still relatively low  Abdominal pain resolved  Protein calorie malnutrition- severe- diet + Ensure ID- on vanco for cellulitis RLE  Improved from admissions but still some fluid and redness  Zosyn for acute cholecystitis, s/p perc drainage Deconditioning- continue PT/OT   LOS: 6 days    Melrose Nakayama 05/27/2022 Albumin 1.8

## 2022-05-24 NOTE — Progress Notes (Signed)
Nutrition Follow-up  DOCUMENTATION CODES:   Obesity unspecified  INTERVENTION:   Liberalized diet to REGULAR to allow pt more food options to encourage po intake given pt with no appetite and altered taste.    30 ml ProSource Plus BID, each supplement provides 100 kcals and 15 grams protein.   Ensure Enlive po BID, each supplement provides 350 kcal and 20 grams of protein. OK for RN/patient to mix with ice cream to create a "milkshake"  if this improves palatability and intake of supplement   Recommend checking vitamin C and zinc levels with CRP. Plan to add Zinc 220 mg daily for 14 days. Further recommendations to follow pending lab results  NUTRITION DIAGNOSIS:   Inadequate oral intake related to decreased appetite as evidenced by meal completion < 25%.  Being addressed  GOAL:   Patient will meet greater than or equal to 90% of their needs  Not met   MONITOR:   PO intake, Supplement acceptance  REASON FOR ASSESSMENT:   Malnutrition Screening Tool    ASSESSMENT:   Pt with a past history of hypertension, dyslipidemia, Crohn's disease, prostate cancer status post radical prostatectomy, stage III chronic kidney disease, and remote history of tobacco abuse.  He was recently discovered to have coronary artery disease and underwent CABG x3 by Dr. Roxan Hockey on 05/07/2022.  8/04 Admitted with acute abd pain, cellulitis to right thigh post vein harvest for CABG 8/08 IR performed cholecystotomy for acute cholecystitis  Pt alert, eating lunch on visit today. Pt remains on neo-synephrine  Appetite remains poor, pt reports altered taste is a big barrier to adequate po intake as well. Pt reports he previously enjoyed the food at the hospital on previous admissions but reports everything tastes "off" or "bad". Pt reports this was also the case at home after discharge post CABG. Food he normally enjoys did not taste good. Pt denies any N/V. +BM  At lunch today, pt only ordered  tomato soup, ice cream and water for lunch. Pt only able to eat the tomato soup.    Limited documentation of po intake but recorded po intake has been 0-10%. Nursing staff also report pt has not been eating well.   Pt reports he does not really like the Ensure supplements. RN made Ensure into milkshake this AM and pt enjoyed this and stated it made the Ensure much more palatable. Discussed with RN: ok to mix Ensure when ice cream in future if this improves pt intake of supplement. Placed in admin instruction  Net +10 L per I/O flow sheet. Noted pt with moderate edema on exam. Weight up JP drain with 155 mL output  Labs: reviewed Meds: 1/2 NS with KCl at 75 ml/hr, MVI, midodrine  Diet Order:  HEART HEALTHY   EDUCATION NEEDS:   Education needs have been addressed (foucsed on importance of adequate nutrition, adequate protein)  Skin:  Skin Assessment: Skin Integrity Issues: Skin Integrity Issues:: Incisions Incisions: closed chest, closed rt leg  Last BM:  8/9  Height:   Ht Readings from Last 1 Encounters:  05/23/22 5' 8"  (1.727 m)    Weight:   Wt Readings from Last 1 Encounters:  06/01/2022 108 kg    Ideal Body Weight:  70 kg  BMI:  Body mass index is 36.2 kg/m.  Estimated Nutritional Needs:   Kcal:  1900-2100  Protein:  105-120 grams  Fluid:  > 1.9 L    Kerman Passey MS, RDN, LDN, CNSC Registered Dietitian 3 Clinical Nutrition RD  Comptroller Number Located in [place]

## 2022-05-24 NOTE — Progress Notes (Signed)
Referring Physician(s): Dr. Rosendo Gros  Supervising Physician: Corrie Mckusick  Patient Status:  [facility] - In-pt  Chief Complaint:  Acute cholecystitis s/p cholecystostomy tube placement     Subjective:  Pt sitting upright in bed, working with PT. He is A&O. Pt states he is feeling better. He denies fever, chills, N/V. He denies pain/tenderness to insertion site.   Allergies: Colchicine and Protamine  Medications: Prior to Admission medications   Medication Sig Start Date End Date Taking? Authorizing Provider  allopurinol (ZYLOPRIM) 100 MG tablet Take 1 tablet (100 mg total) by mouth 2 (two) times daily. 01/15/22  Yes Dettinger, Fransisca Kaufmann, MD  amiodarone (PACERONE) 200 MG tablet Take 1 tablet (200 mg total) by mouth daily. 05/17/22  Yes Barrett, Erin R, PA-C  amoxicillin-clavulanate (AUGMENTIN) 500-125 MG tablet Take 1 tablet (500 mg total) by mouth every 8 (eight) hours. 05/17/22  Yes Barrett, Erin R, PA-C  apixaban (ELIQUIS) 5 MG TABS tablet Take 1 tablet (5 mg total) by mouth 2 (two) times daily. 05/15/22  Yes Barrett, Erin R, PA-C  baclofen (LIORESAL) 10 MG tablet TAKE 1 TABLET BY MOUTH AT BEDTIME AS NEEDED FOR MUSCLE SPASMS. Patient taking differently: Take 10 mg by mouth at bedtime as needed for muscle spasms. 01/15/22  Yes Dettinger, Fransisca Kaufmann, MD  Cholecalciferol (VITAMIN D3) 125 MCG (5000 UT) CAPS Take 5,000 Units by mouth See admin instructions. Take 1 capsule (5000 units) by mouth every day except on Sundays.   Yes [provider]  gemfibrozil (LOPID) 600 MG tablet Take 1 tablet (600 mg total) by mouth 3 (three) times a week. Monday , Wednesday, Friday Patient taking differently: Take 600 mg by mouth every Monday, Wednesday, and Friday. 01/15/22  Yes Dettinger, Fransisca Kaufmann, MD  hydrochlorothiazide (HYDRODIURIL) 12.5 MG tablet Take 12.5 mg by mouth daily.   Yes [provider]  losartan (COZAAR) 25 MG tablet Take 25 mg by mouth daily.   Yes [provider]   metoprolol tartrate (LOPRESSOR) 100 MG tablet Take 1 tablet (100 mg total) by mouth 2 (two) times daily. 05/15/22  Yes Barrett, Erin R, PA-C  nystatin-triamcinolone (MYCOLOG II) cream Apply 1 application topically 2 (two) times daily. Patient taking differently: Apply 1 application  topically 2 (two) times daily as needed (rash). 03/05/18  Yes Chipper Herb, MD  Omega-3 Fatty Acids (OMEGA-3 FISH OIL) 1200 MG CAPS Take 2,400 mg by mouth in the morning and at bedtime.   Yes [provider]  potassium chloride SA (KLOR-CON M20) 20 MEQ tablet Take 2 tablets (40 mEq total) by mouth daily. 10/02/21  Yes Dettinger, Fransisca Kaufmann, MD  traMADol (ULTRAM) 50 MG tablet Take 1 tablet (50 mg total) by mouth every 6 (six) hours as needed for moderate pain. 05/15/22  Yes Barrett, Erin R, PA-C  aspirin 81 MG EC tablet Take 81 mg by mouth every evening. Patient not taking: Reported on 06/12/2022    [provider]  atorvastatin (LIPITOR) 40 MG tablet TAKE 1 TABLET BY MOUTH DAILY AT 6 PM. Patient not taking: Reported on 05/16/2022 09/20/21   Dettinger, Fransisca Kaufmann, MD     Vital Signs: BP (!) 112/55   Pulse 81   Temp 98.6 F (37 C)   Resp (!) 24   Ht 5' 8"  (1.727 m)   Wt 238 lb 1.6 oz (108 kg)   SpO2 97%   BMI 36.20 kg/m   Physical Exam Vitals reviewed.  Constitutional:      General: He is  not in acute distress.    Appearance: Normal appearance. He is ill-appearing.  HENT:     Head: Normocephalic and atraumatic.  Eyes:     Extraocular Movements: Extraocular movements intact.     Pupils: Pupils are equal, round, and reactive to light.  Pulmonary:     Effort: Pulmonary effort is normal. No respiratory distress.  Abdominal:     Tenderness: There is no abdominal tenderness.     Comments: Drain flushes/aspirates easily. Site unremarkable with sutures/statlock in place. ~ 75 cc red, bloody OP in gravity bag. Dressing moist with small amount of blood. No active bleeding, oozing noted around insertion  site. Clean dressing applied.   Musculoskeletal:     Right lower leg: Edema present.     Left lower leg: Edema present.  Skin:    General: Skin is warm and dry.     Findings: Bruising present.     Comments: Pt has RUE, RLE ecchymosis in the pattern of the SCD's.   Neurological:     Mental Status: He is alert and oriented to person, place, and time.  Psychiatric:        Mood and Affect: Mood normal.        Behavior: Behavior normal.        Thought Content: Thought content normal.        Judgment: Judgment normal.     Imaging: DG Chest Port 1 View  Result Date: 06/04/2022 CLINICAL DATA:  Sore chest today after CABG. EXAM: PORTABLE CHEST 1 VIEW COMPARISON:  05/22/2022 FINDINGS: Status post median sternotomy and CABG. RIGHT sided PICC line tip overlies the superior vena cava. Heart is enlarged but stable in configuration. There is persistent opacity at the LEFT lung base, obscuring the LEFT hemidiaphragm and similar to prior studies. No pneumothorax. No pulmonary edema. IMPRESSION: Similar appearance of LEFT LOWER lobe opacity.  Stable cardiomegaly. Electronically Signed   By: Nolon Nations M.D.   On: 06/03/2022 08:10   IR Perc Cholecystostomy  Result Date: 05/22/2022 INDICATION: 81 year old gentleman with acute calculus cholecystitis presents to IR for cholecystostomy drain placement. EXAM: Ultrasound and fluoroscopy guided cholecystostomy drain placement MEDICATIONS: Patient currently on inpatient regimen of broad-spectrum antibiotics. ANESTHESIA/SEDATION: Moderate (conscious) sedation was employed during this procedure. A total of Versed 0.5 mg and Fentanyl 25 mcg was administered intravenously. Moderate Sedation Time: 10 minutes. The patient's level of consciousness and vital signs were monitored continuously by radiology nursing throughout the procedure under my direct supervision. FLUOROSCOPY TIME:  Radiation Exposure Index (as provided by the fluoroscopic device): 8 mGy Kerma  COMPLICATIONS: None immediate. PROCEDURE: Informed written consent was obtained from the patient after a thorough discussion of the procedural risks, benefits and alternatives. All questions were addressed. Maximal Sterile Barrier Technique was utilized including caps, mask, sterile gowns, sterile gloves, sterile drape, hand hygiene and skin antiseptic. A timeout was performed prior to the initiation of the procedure. Patient positioned supine on the angiography table. Right upper quadrant abdominal skin prepped and draped in the usual sterile fashion. Sterile probe cover and sterile gel utilized throughout the procedure. Using ultrasound and fluoroscoping guidance, the gallbladder was accessed was accessed with a 21 ga needle. Contrast administered through the needle confirmed appropriate access within the gallbladder lumen. 0.018 inch guidewire advanced into the gallbladder lumen and the needle was exchanged for a transitional dilator set. Percutaneous contrast administration demonstrated appropriate access the gallbladder lumen. Filling defect consistent with gallstone. 10.2 fr multipurpose pigtail drain was inserted over the guidewire. The  catheter tip was positioned in the gallbladder lumen. The catheter was secured to skin with suture and connected to bag. IMPRESSION: 10.2 French cholecystostomy drain placed using fluoroscopic and ultrasound guidance. PLAN: Follow-up in 6-8 weeks in IR clinic for cholangiogram. Electronically Signed   By: Miachel Roux M.D.   On: 05/22/2022 16:16   DG Chest Port 1 View  Result Date: 05/22/2022 CLINICAL DATA:  36629; this of breath EXAM: PORTABLE CHEST 1 VIEW COMPARISON:  Radiograph dated May 20, 2022 FINDINGS: The cardiomediastinal silhouette is unchanged in contour.Status post median sternotomy and CABG. Elevation of the RIGHT hemidiaphragm, unchanged. RIGHT upper extremity PICC tip terminates over the superior cavoatrial junction. Atherosclerotic calcifications of the  aorta. Small bilateral pleural effusions. No pneumothorax. LEFT retrocardiac opacity, likely atelectasis. Visualized abdomen is unremarkable. IMPRESSION: Small bilateral pleural effusions and favored LEFT basilar atelectasis. Electronically Signed   By: Valentino Saxon M.D.   On: 05/22/2022 08:02   CT ABDOMEN PELVIS W CONTRAST  Result Date: 05/21/2022 CLINICAL DATA:  Right-sided abdominal pain EXAM: CT ABDOMEN AND PELVIS WITH CONTRAST TECHNIQUE: Multidetector CT imaging of the abdomen and pelvis was performed using the standard protocol following bolus administration of intravenous contrast. RADIATION DOSE REDUCTION: This exam was performed according to the departmental dose-optimization program which includes automated exposure control, adjustment of the mA and/or kV according to patient size and/or use of iterative reconstruction technique. CONTRAST:  18m OMNIPAQUE IOHEXOL 350 MG/ML SOLN COMPARISON:  05/30/2022 FINDINGS: Lower chest: Small bilateral pleural effusions are seen with interval increase in size. There are patchy infiltrates in the posterior lower lung fields with interval worsening. Coronary artery calcifications are seen. Metallic suture is seen in sternum suggest previous coronary bypass surgery. Small to moderate pericardial effusion is seen. Hepatobiliary: There is no dilation of bile ducts. Gallbladder is distended. There is wall thickening in gallbladder. There is mild edema in the pericholecystic region. There is calcified gallbladder stone. Pancreas: No focal abnormalities are seen. Spleen: Unremarkable. Adrenals/Urinary Tract: Adrenals are unremarkable. There is no hydronephrosis. There are multiple smooth marginated fluid density lesions in both kidneys measuring up to 10 mm in maximum diameter with no significant change. There is 1 mm calcific density in the lower pole of left kidney. Ureters are not dilated. Urinary bladder is not distended. Stomach/Bowel: Stomach is not distended.  Small bowel loops are not dilated. The appendix is not seen. There is no significant wall thickening and colon. Scattered diverticula are seen in the colon without signs of focal acute diverticulitis. Vascular/Lymphatic: Arterial calcifications are seen in aorta and its major branches. Reproductive: Prostate is not seen, possibly suggesting previous intervention. Other: Small ascites is present. There is no pneumoperitoneum. There is previous ventral hernia repair. Musculoskeletal: Lumbar spondylosis with spinal stenosis and encroachment of neural foramina at multiple levels. IMPRESSION: Gallbladder stone. Gallbladder is distended along with wall thickening and pericholecystic stranding. Possibility of acute cholecystitis is not excluded. Follow-up gallbladder sonogram should be considered. There is no evidence of intestinal obstruction or pneumoperitoneum. There is no hydronephrosis. Small bilateral pleural effusions with interval increase. Small to moderate pericardial effusion. There are patchy infiltrates with air bronchogram in the lower lung fields suggesting atelectasis/pneumonia with interval worsening. Severe coronary artery disease. Diverticulosis of colon without signs of focal diverticulitis. Bilateral renal cysts with no significant interval change. No follow-up studies are recommended. Lumbar spondylosis. Other findings as described in the body of the report. These results will be called to the ordering clinician or representative by the Radiologist Assistant, and  communication documented in the PACS or [employer]. Electronically Signed   By: Elmer Picker M.D.   On: 05/21/2022 14:04   DG Abd Portable 1V  Result Date: 05/21/2022 CLINICAL DATA:  Abdominal pain EXAM: PORTABLE ABDOMEN - 1 VIEW COMPARISON:  05/20/2022 FINDINGS: The bowel gas pattern is nonobstructive. No gross free intraperitoneal air. No radio-opaque calculi or other significant radiographic abnormality are seen.  IMPRESSION: Nonobstructive bowel gas pattern. Electronically Signed   By: Davina Poke D.O.   On: 05/21/2022 08:49    Labs:  CBC: Recent Labs    05/21/22 0354 05/22/22 0424 05/23/22 0918 05/26/2022 0520  WBC 26.9* 33.4* 20.9* 12.2*  HGB 11.6* 11.9* 11.3* 11.0*  HCT 35.0* 35.2* 34.1* 33.2*  PLT 267 284 299 335    COAGS: Recent Labs    05/03/22 0857 05/07/22 1336 05/22/22 0757  INR 1.0 1.4* 2.1*  APTT 30 32  --     BMP: Recent Labs    05/21/22 0354 05/22/22 0424 05/23/22 0425 05/26/2022 0520  NA 137 134* 134* 133*  K 3.1* 3.5 3.4* 3.4*  CL 105 103 102 103  CO2 24 23 23 22   GLUCOSE 258* 121* 94 79  BUN 15 17 21 21   CALCIUM 8.4* 8.4* 8.2* 8.3*  CREATININE 1.23 1.35* 1.47* 1.40*  GFRNONAA >60 54* 49* 51*    LIVER FUNCTION TESTS: Recent Labs    05/03/22 0857 05/21/22 0354 05/22/22 0424 05/23/22 0425  BILITOT 2.8* 4.1* 4.3* 3.3*  AST 28 106* 71* 62*  ALT 20 61* 53* 41  ALKPHOS 46 104 93 93  PROT 6.1* 5.7* 5.4* 5.4*  ALBUMIN 3.1* 2.4* 2.2* 1.9*    Assessment and Plan:  Acute cholecystitis s/p cholecystostomy tube placement with Dr. Dwaine Gale 05/22/22.  Drain flushes/aspirates easily. Site unremarkable with sutures/statlock in place. ~ 75 cc red, bloody OP in gravity bag. Dressing moist with small amount of blood. No active bleeding, oozing noted around insertion site. Clean dressing applied.   WBC trending down 12.2 (20.9), afebrile T bili unchanged at 3.3  Continue to flush drain with 5 cc NS TID. Record OP q shift.  Change dressing q shift or as needed to keep clean and dry.   Contact IR if difficulty flushing or if there is a sudden change in OP.   Pt to f/u in IR clinic 10-14 days after d/c for evaluation with repeat imaging or drain injection.        Electronically Signed: Tyson Alias, NP 05/15/2022, 2:25 PM   I spent a total of 15 Minutes at the the patient's bedside AND on the patient's hospital floor or unit, greater than 50% of which was  counseling/coordinating care for acute cholecystitis.

## 2022-05-24 NOTE — Progress Notes (Signed)
      [address]       [place] [address]             [phone]      Stable day  BP 112/70   Pulse 81   Temp 97.7 F (36.5 C) (Oral)   Resp 20   Ht 5' 8"  (1.727 m)   Wt 108 kg   SpO2 94%   BMI 36.20 kg/m   Intake/Output Summary (Last 24 hours) at 05/25/2022 1727 Last data filed at 06/09/2022 1600 Gross per 24 hour  Intake 1191.88 ml  Output 985 ml  Net 206.88 ml   Continue IV vanco and Zosyn Continue PT/OT  Remo Lipps C. Roxan Hockey, MD [facility] [phone]

## 2022-05-25 DIAGNOSIS — L03115 Cellulitis of right lower limb: Secondary | ICD-10-CM | POA: Diagnosis not present

## 2022-05-25 LAB — GLUCOSE, CAPILLARY
Glucose-Capillary: 100 mg/dL — ABNORMAL HIGH (ref 70–99)
Glucose-Capillary: 104 mg/dL — ABNORMAL HIGH (ref 70–99)
Glucose-Capillary: 124 mg/dL — ABNORMAL HIGH (ref 70–99)
Glucose-Capillary: 126 mg/dL — ABNORMAL HIGH (ref 70–99)
Glucose-Capillary: 137 mg/dL — ABNORMAL HIGH (ref 70–99)
Glucose-Capillary: 99 mg/dL (ref 70–99)

## 2022-05-25 LAB — BASIC METABOLIC PANEL
Anion gap: 8 (ref 5–15)
BUN: 20 mg/dL (ref 8–23)
CO2: 21 mmol/L — ABNORMAL LOW (ref 22–32)
Calcium: 8.4 mg/dL — ABNORMAL LOW (ref 8.9–10.3)
Chloride: 103 mmol/L (ref 98–111)
Creatinine, Ser: 1.17 mg/dL (ref 0.61–1.24)
GFR, Estimated: >60 mL/min (ref >60)
Glucose, Bld: 99 mg/dL (ref 70–99)
Potassium: 3.8 mmol/L (ref 3.5–5.1)
Sodium: 132 mmol/L — ABNORMAL LOW (ref 135–145)

## 2022-05-25 LAB — CBC
HCT: 33.2 % — ABNORMAL LOW (ref 39.0–52.0)
Hemoglobin: 11 g/dL — ABNORMAL LOW (ref 13.0–17.0)
MCH: 31.6 pg (ref 26.0–34.0)
MCHC: 33.1 g/dL (ref 30.0–36.0)
MCV: 95.4 fL (ref 80.0–100.0)
Platelets: 340 10*3/uL (ref 150–400)
RBC: 3.48 MIL/uL — ABNORMAL LOW (ref 4.22–5.81)
RDW: 16.2 % — ABNORMAL HIGH (ref 11.5–15.5)
WBC: 8.4 10*3/uL (ref 4.0–10.5)
nRBC: 0 % (ref 0.0–0.2)

## 2022-05-25 LAB — PROTIME-INR
INR: 1.5 — ABNORMAL HIGH (ref 0.8–1.2)
Prothrombin Time: 17.5 seconds — ABNORMAL HIGH (ref 11.4–15.2)

## 2022-05-25 MED ORDER — AMIODARONE LOAD VIA INFUSION
150.0000 mg | Freq: Once | INTRAVENOUS | Status: AC
  Administered 2022-05-25: 150 mg via INTRAVENOUS
  Filled 2022-05-25: qty 83.34

## 2022-05-25 MED ORDER — OXYCODONE HCL 5 MG PO TABS
5.0000 mg | ORAL_TABLET | ORAL | Status: DC | PRN
  Administered 2022-05-25 – 2022-05-26 (×2): 10 mg via ORAL
  Administered 2022-05-26: 5 mg via ORAL
  Administered 2022-05-27 (×2): 10 mg via ORAL
  Filled 2022-05-25 (×5): qty 2

## 2022-05-25 MED ORDER — AMIODARONE HCL 200 MG PO TABS
200.0000 mg | ORAL_TABLET | Freq: Every day | ORAL | Status: DC
  Administered 2022-05-25: 200 mg via ORAL
  Filled 2022-05-25: qty 1

## 2022-05-25 MED ORDER — FUROSEMIDE 40 MG PO TABS
40.0000 mg | ORAL_TABLET | Freq: Every day | ORAL | Status: DC
  Administered 2022-05-25 – 2022-05-27 (×3): 40 mg via ORAL
  Filled 2022-05-25 (×3): qty 1

## 2022-05-25 MED ORDER — AMIODARONE HCL IN DEXTROSE 360-4.14 MG/200ML-% IV SOLN
30.0000 mg/h | INTRAVENOUS | Status: AC
  Administered 2022-05-25 (×2): 30 mg/h via INTRAVENOUS
  Filled 2022-05-25: qty 200

## 2022-05-25 MED ORDER — AMIODARONE HCL IN DEXTROSE 360-4.14 MG/200ML-% IV SOLN
INTRAVENOUS | Status: AC
  Filled 2022-05-25: qty 200

## 2022-05-25 MED ORDER — VANCOMYCIN HCL 750 MG/150ML IV SOLN
750.0000 mg | INTRAVENOUS | Status: AC
  Administered 2022-05-25 – 2022-05-26 (×2): 750 mg via INTRAVENOUS
  Filled 2022-05-25 (×2): qty 150

## 2022-05-25 MED ORDER — AMIODARONE IV BOLUS ONLY 150 MG/100ML
INTRAVENOUS | Status: AC
  Filled 2022-05-25: qty 100

## 2022-05-25 MED ORDER — AMIODARONE HCL IN DEXTROSE 360-4.14 MG/200ML-% IV SOLN: 30.0000 mg/h | INTRAVENOUS | Status: DC

## 2022-05-25 MED ORDER — AMIODARONE HCL 200 MG PO TABS
200.0000 mg | ORAL_TABLET | Freq: Two times a day (BID) | ORAL | Status: DC
  Administered 2022-05-25 – 2022-05-27 (×5): 200 mg via ORAL
  Filled 2022-05-25 (×5): qty 1

## 2022-05-25 NOTE — Progress Notes (Signed)
Referring Physician(s): Dr. Rosendo Gros  Supervising Physician: Jacqulynn Cadet  Patient Status:  [facility] - In-pt  Chief Complaint:  Acute cholecystitis s/p cholecystostomy tube placement on 8/8 by Dr. Dwaine Gale  Subjective: Pt sitting in bed, sleeping.  States that he is feeling well today, was able to move around and sit in a chair for a while this morning.  Able to eat solid food, but does not have much appetite.    Allergies: Colchicine and Protamine  Medications: Prior to Admission medications   Medication Sig Start Date End Date Taking? Authorizing Provider  allopurinol (ZYLOPRIM) 100 MG tablet Take 1 tablet (100 mg total) by mouth 2 (two) times daily. 01/15/22  Yes Dettinger, Fransisca Kaufmann, MD  amiodarone (PACERONE) 200 MG tablet Take 1 tablet (200 mg total) by mouth daily. 05/17/22  Yes Barrett, Erin R, PA-C  amoxicillin-clavulanate (AUGMENTIN) 500-125 MG tablet Take 1 tablet (500 mg total) by mouth every 8 (eight) hours. 05/17/22  Yes Barrett, Erin R, PA-C  apixaban (ELIQUIS) 5 MG TABS tablet Take 1 tablet (5 mg total) by mouth 2 (two) times daily. 05/15/22  Yes Barrett, Erin R, PA-C  baclofen (LIORESAL) 10 MG tablet TAKE 1 TABLET BY MOUTH AT BEDTIME AS NEEDED FOR MUSCLE SPASMS. Patient taking differently: Take 10 mg by mouth at bedtime as needed for muscle spasms. 01/15/22  Yes Dettinger, Fransisca Kaufmann, MD  Cholecalciferol (VITAMIN D3) 125 MCG (5000 UT) CAPS Take 5,000 Units by mouth See admin instructions. Take 1 capsule (5000 units) by mouth every day except on Sundays.   Yes [provider]  gemfibrozil (LOPID) 600 MG tablet Take 1 tablet (600 mg total) by mouth 3 (three) times a week. Monday , Wednesday, Friday Patient taking differently: Take 600 mg by mouth every Monday, Wednesday, and Friday. 01/15/22  Yes Dettinger, Fransisca Kaufmann, MD  hydrochlorothiazide (HYDRODIURIL) 12.5 MG tablet Take 12.5 mg by mouth daily.   Yes [provider]  losartan (COZAAR) 25 MG tablet Take 25 mg by  mouth daily.   Yes [provider]  metoprolol tartrate (LOPRESSOR) 100 MG tablet Take 1 tablet (100 mg total) by mouth 2 (two) times daily. 05/15/22  Yes Barrett, Erin R, PA-C  nystatin-triamcinolone (MYCOLOG II) cream Apply 1 application topically 2 (two) times daily. Patient taking differently: Apply 1 application  topically 2 (two) times daily as needed (rash). 03/05/18  Yes Chipper Herb, MD  Omega-3 Fatty Acids (OMEGA-3 FISH OIL) 1200 MG CAPS Take 2,400 mg by mouth in the morning and at bedtime.   Yes [provider]  potassium chloride SA (KLOR-CON M20) 20 MEQ tablet Take 2 tablets (40 mEq total) by mouth daily. 10/02/21  Yes Dettinger, Fransisca Kaufmann, MD  traMADol (ULTRAM) 50 MG tablet Take 1 tablet (50 mg total) by mouth every 6 (six) hours as needed for moderate pain. 05/15/22  Yes Barrett, Erin R, PA-C  aspirin 81 MG EC tablet Take 81 mg by mouth every evening. Patient not taking: Reported on 05/22/2022    [provider]  atorvastatin (LIPITOR) 40 MG tablet TAKE 1 TABLET BY MOUTH DAILY AT 6 PM. Patient not taking: Reported on 05/26/2022 09/20/21   Dettinger, Fransisca Kaufmann, MD     Vital Signs: BP 105/77 (BP Location: Left Arm)   Pulse 87   Temp 97.8 F (36.6 C) (Oral)   Resp (!) 26   Ht 5' 8"  (1.727 m)   Wt 243 lb 13.3 oz (110.6 kg)   SpO2 96%   BMI  37.07 kg/m   Physical Exam Vitals reviewed.  Constitutional:      General: He is not in acute distress.    Appearance: Normal appearance.  HENT:     Head: Normocephalic and atraumatic.  Eyes:     Extraocular Movements: Extraocular movements intact.     Pupils: Pupils are equal, round, and reactive to light.  Pulmonary:     Effort: Pulmonary effort is normal. No respiratory distress.  Skin:    General: Skin is warm and dry.     Comments: Positive RUQ drain to gravity bag. Site with small amount of blood on the dressing, no active bleeding noted. Otherwise unremarkable with no erythema, edema, tenderness, or  drainage. Suture and stat lock in place. Dressing is intact, mix of dried and wet blood on the dressing. Trace of  bloody fluid noted in the bag. Drain aspirates and flushes well.    Neurological:     Mental Status: He is alert.  Psychiatric:        Mood and Affect: Mood normal.        Behavior: Behavior normal.        Thought Content: Thought content normal.        Judgment: Judgment normal.     Imaging: DG Chest Port 1 View  Result Date: 05/25/2022 CLINICAL DATA:  Sore chest today after CABG. EXAM: PORTABLE CHEST 1 VIEW COMPARISON:  05/22/2022 FINDINGS: Status post median sternotomy and CABG. RIGHT sided PICC line tip overlies the superior vena cava. Heart is enlarged but stable in configuration. There is persistent opacity at the LEFT lung base, obscuring the LEFT hemidiaphragm and similar to prior studies. No pneumothorax. No pulmonary edema. IMPRESSION: Similar appearance of LEFT LOWER lobe opacity.  Stable cardiomegaly. Electronically Signed   By: Nolon Nations M.D.   On: 05/16/2022 08:10   IR Perc Cholecystostomy  Result Date: 05/22/2022 INDICATION: 81 year old gentleman with acute calculus cholecystitis presents to IR for cholecystostomy drain placement. EXAM: Ultrasound and fluoroscopy guided cholecystostomy drain placement MEDICATIONS: Patient currently on inpatient regimen of broad-spectrum antibiotics. ANESTHESIA/SEDATION: Moderate (conscious) sedation was employed during this procedure. A total of Versed 0.5 mg and Fentanyl 25 mcg was administered intravenously. Moderate Sedation Time: 10 minutes. The patient's level of consciousness and vital signs were monitored continuously by radiology nursing throughout the procedure under my direct supervision. FLUOROSCOPY TIME:  Radiation Exposure Index (as provided by the fluoroscopic device): 8 mGy Kerma COMPLICATIONS: None immediate. PROCEDURE: Informed written consent was obtained from the patient after a thorough discussion of the  procedural risks, benefits and alternatives. All questions were addressed. Maximal Sterile Barrier Technique was utilized including caps, mask, sterile gowns, sterile gloves, sterile drape, hand hygiene and skin antiseptic. A timeout was performed prior to the initiation of the procedure. Patient positioned supine on the angiography table. Right upper quadrant abdominal skin prepped and draped in the usual sterile fashion. Sterile probe cover and sterile gel utilized throughout the procedure. Using ultrasound and fluoroscoping guidance, the gallbladder was accessed was accessed with a 21 ga needle. Contrast administered through the needle confirmed appropriate access within the gallbladder lumen. 0.018 inch guidewire advanced into the gallbladder lumen and the needle was exchanged for a transitional dilator set. Percutaneous contrast administration demonstrated appropriate access the gallbladder lumen. Filling defect consistent with gallstone. 10.2 fr multipurpose pigtail drain was inserted over the guidewire. The catheter tip was positioned in the gallbladder lumen. The catheter was secured to skin with suture and connected to bag. IMPRESSION: 10.2 [place]  cholecystostomy drain placed using fluoroscopic and ultrasound guidance. PLAN: Follow-up in 6-8 weeks in IR clinic for cholangiogram. Electronically Signed   By: Miachel Roux M.D.   On: 05/22/2022 16:16   DG Chest Port 1 View  Result Date: 05/22/2022 CLINICAL DATA:  [id number]; this of breath EXAM: PORTABLE CHEST 1 VIEW COMPARISON:  Radiograph dated May 20, 2022 FINDINGS: The cardiomediastinal silhouette is unchanged in contour.Status post median sternotomy and CABG. Elevation of the RIGHT hemidiaphragm, unchanged. RIGHT upper extremity PICC tip terminates over the superior cavoatrial junction. Atherosclerotic calcifications of the aorta. Small bilateral pleural effusions. No pneumothorax. LEFT retrocardiac opacity, likely atelectasis. Visualized abdomen is  unremarkable. IMPRESSION: Small bilateral pleural effusions and favored LEFT basilar atelectasis. Electronically Signed   By: Valentino Saxon M.D.   On: 05/22/2022 08:02   CT ABDOMEN PELVIS W CONTRAST  Result Date: 05/21/2022 CLINICAL DATA:  Right-sided abdominal pain EXAM: CT ABDOMEN AND PELVIS WITH CONTRAST TECHNIQUE: Multidetector CT imaging of the abdomen and pelvis was performed using the standard protocol following bolus administration of intravenous contrast. RADIATION DOSE REDUCTION: This exam was performed according to the departmental dose-optimization program which includes automated exposure control, adjustment of the mA and/or kV according to patient size and/or use of iterative reconstruction technique. CONTRAST:  172m OMNIPAQUE IOHEXOL 350 MG/ML SOLN COMPARISON:  06/10/2022 FINDINGS: Lower chest: Small bilateral pleural effusions are seen with interval increase in size. There are patchy infiltrates in the posterior lower lung fields with interval worsening. Coronary artery calcifications are seen. Metallic suture is seen in sternum suggest previous coronary bypass surgery. Small to moderate pericardial effusion is seen. Hepatobiliary: There is no dilation of bile ducts. Gallbladder is distended. There is wall thickening in gallbladder. There is mild edema in the pericholecystic region. There is calcified gallbladder stone. Pancreas: No focal abnormalities are seen. Spleen: Unremarkable. Adrenals/Urinary Tract: Adrenals are unremarkable. There is no hydronephrosis. There are multiple smooth marginated fluid density lesions in both kidneys measuring up to 10 mm in maximum diameter with no significant change. There is 1 mm calcific density in the lower pole of left kidney. Ureters are not dilated. Urinary bladder is not distended. Stomach/Bowel: Stomach is not distended. Small bowel loops are not dilated. The appendix is not seen. There is no significant wall thickening and colon. Scattered  diverticula are seen in the colon without signs of focal acute diverticulitis. Vascular/Lymphatic: Arterial calcifications are seen in aorta and its major branches. Reproductive: Prostate is not seen, possibly suggesting previous intervention. Other: Small ascites is present. There is no pneumoperitoneum. There is previous ventral hernia repair. Musculoskeletal: Lumbar spondylosis with spinal stenosis and encroachment of neural foramina at multiple levels. IMPRESSION: Gallbladder stone. Gallbladder is distended along with wall thickening and pericholecystic stranding. Possibility of acute cholecystitis is not excluded. Follow-up gallbladder sonogram should be considered. There is no evidence of intestinal obstruction or pneumoperitoneum. There is no hydronephrosis. Small bilateral pleural effusions with interval increase. Small to moderate pericardial effusion. There are patchy infiltrates with air bronchogram in the lower lung fields suggesting atelectasis/pneumonia with interval worsening. Severe coronary artery disease. Diverticulosis of colon without signs of focal diverticulitis. Bilateral renal cysts with no significant interval change. No follow-up studies are recommended. Lumbar spondylosis. Other findings as described in the body of the report. These results will be called to the ordering clinician or representative by the Radiologist Assistant, and communication documented in the PACS or [employer] Electronically Signed   By: PElmer PickerM.D.   On: 05/21/2022 14:04  Labs:  CBC: Recent Labs    05/22/22 0424 05/23/22 0918 05/17/2022 0520 05/25/22 0506  WBC 33.4* 20.9* 12.2* 8.4  HGB 11.9* 11.3* 11.0* 11.0*  HCT 35.2* 34.1* 33.2* 33.2*  PLT 284 299 335 340     COAGS: Recent Labs    05/03/22 0857 05/07/22 1336 05/22/22 0757  INR 1.0 1.4* 2.1*  APTT 30 32  --      BMP: Recent Labs    05/22/22 0424 05/23/22 0425 05/31/2022 0520 05/25/22 0506  NA 134* 134* 133*  132*  K 3.5 3.4* 3.4* 3.8  CL 103 102 103 103  CO2 23 23 22  21*  GLUCOSE 121* 94 79 99  BUN 17 21 21 20   CALCIUM 8.4* 8.2* 8.3* 8.4*  CREATININE 1.35* 1.47* 1.40* 1.17  GFRNONAA 54* 49* 51* >60     LIVER FUNCTION TESTS: Recent Labs    05/03/22 0857 05/21/22 0354 05/22/22 0424 05/23/22 0425  BILITOT 2.8* 4.1* 4.3* 3.3*  AST 28 106* 71* 62*  ALT 20 61* 53* 41  ALKPHOS 46 104 93 93  PROT 6.1* 5.7* 5.4* 5.4*  ALBUMIN 3.1* 2.4* 2.2* 1.9*     Assessment and Plan:   Acute cholecystitis with calculus s/p cholecystostomy tube placement with Dr. Dwaine Gale 05/22/22.  WBC wnl today, hgb stable VS tachypenic, tachycardic and BP soft at times (105/77) Cx no growth x 2 days  OP 125 mL overnight, bloody output today.    Drain Location: RUQ Size: Fr size: 10 Fr Date of placement: 8/8  Currently to: Drain collection device: gravity 24 hour output:  Output by Drain (mL) 05/23/22 0701 - 05/23/22 1900 05/23/22 1901 - 05/19/2022 0700 05/16/2022 0701 - 05/25/2022 1900 05/31/2022 1901 - 05/25/22 0700 05/25/22 0701 - 05/25/22 0853  Biliary Tube Cook slip-coat 10.2 Fr. RUQ 100 55  125     Interval imaging/drain manipulation:  None   Current examination: Flushes/aspirates easily.  Insertion site unremarkable. Suture and stat lock in place. Dressed appropriately.   Plan: Continue TID flushes with 5 cc NS. Record output Q shift. Dressing changes QD or PRN if soiled.  Call IR APP or on call IR MD if difficulty flushing or sudden change in drain output.  Repeat imaging/possible drain injection once output < 10 mL/QD (excluding flush material). Consideration for drain removal if output is < 10 mL/QD (excluding flush material), pending discussion with the providing surgical service.  Discharge planning: Please contact IR APP or on call IR MD prior to patient d/c to ensure appropriate follow up plans are in place. Typically patient will follow up with IR clinic 6-8 weeks post d/c for repeat  imaging/possible drain injection. IR scheduler will contact patient with date/time of appointment. Patient will need to flush drain QD with 5 cc NS, record output QD, dressing changes every 2-3 days or earlier if soiled.   IR will continue to follow - please call with questions or concerns.      Electronically Signed: Tera Mater, PA-C 05/25/2022, 8:52 AM   I spent a total of 15 Minutes at the the patient's bedside AND on the patient's hospital floor or unit, greater than 50% of which was counseling/coordinating care for acute cholecystitis.

## 2022-05-25 NOTE — Progress Notes (Signed)
CT surgery PM rounds  Patient had recurrent rapid atrial fibrillation and was given a course of IV amiodarone.  Continuing oral amiodarone as ordered.  Now back in sinus rhythm.  No more bleeding around the gallbladder drain.  Appreciate assessment by IR. Repeat prothrombin time 1.5.  No DDAVP administered.  Blood pressure 114/62, pulse 86, temperature 98.1 F (36.7 C), temperature source Oral, resp. rate (!) 25, height 5' 8"  (1.727 m), weight 110.6 kg, SpO2 98 %.

## 2022-05-25 NOTE — Progress Notes (Signed)
Interventional Radiology Brief Note:  Patient assessed earlier this afternoon and found to be stable, perc cholecystostomy tube intact without issue.  Called by RN this afternoon stating patient had excessive drainage and bleeding around his tube insertion site.   PA to bedside.  There are saturated bloody gauze in the trash with thin, bilious drainage mixed with blood on the bedside sheets and pillows.         Site intact.  No oozing or bleeding from the site even with manipulation. Suture remains intact. Drain flushes and aspirates well without issue and without leakage around the tract site.   Discussed with Dr. Laurence Ferrari who states drain is not transhepatic and is a direct placement. Patient's INR at the time of placement was 2.1. Have ordered repeat INR.  If INR remains elevated about 2, recommend administration of DDAVP per discretion of primary team.  RN notified. Continue with current drain and site care for now.   Brynda Greathouse, MS RD PA-C 5:15 PM

## 2022-05-25 NOTE — Progress Notes (Signed)
  Subjective: C/o sciatica  Objective: Vital signs in last 24 hours: Temp:  [97.6 F (36.4 C)-98.6 F (37 C)] 97.8 F (36.6 C) (08/11 0700) Pulse Rate:  [80-93] 87 (08/11 0700) Cardiac Rhythm: Normal sinus rhythm (08/10 2000) Resp:  [16-37] 24 (08/11 0700) BP: (92-139)/(49-115) 121/67 (08/11 0700) SpO2:  [80 %-97 %] 97 % (08/11 0700) Weight:  [110.6 kg] 110.6 kg (08/11 0500)  Hemodynamic parameters for last 24 hours:    Intake/Output from previous day: 08/10 0701 - 08/11 0700 In: 2755.9 [P.O.:360; I.V.:2004.3; IV Piggyback:391.6] Out: 725 [Urine:600; Drains:125] Intake/Output this shift: No intake/output data recorded.  General appearance: alert, cooperative, and mild distress Neurologic: intact Heart: regular rate and rhythm Lungs: diminished breath sounds bibasilar Abdomen: normal findings: soft, non-tender  Lab Results: Recent Labs    05/26/2022 0520 05/25/22 0506  WBC 12.2* 8.4  HGB 11.0* 11.0*  HCT 33.2* 33.2*  PLT 335 340   BMET:  Recent Labs    06/05/2022 0520 05/25/22 0506  NA 133* 132*  K 3.4* 3.8  CL 103 103  CO2 22 21*  GLUCOSE 79 99  BUN 21 20  CREATININE 1.40* 1.17  CALCIUM 8.3* 8.4*    PT/INR:  Recent Labs    05/22/22 0757  LABPROT 23.3*  INR 2.1*   ABG    Component Value Date/Time   PHART 7.478 (H) 05/20/2022 0749   HCO3 23.9 05/20/2022 0749   TCO2 25 05/20/2022 0749   ACIDBASEDEF 6.0 (H) 05/07/2022 2302   O2SAT 95 05/20/2022 0749   CBG (last 3)  Recent Labs    05/25/22 0035 05/25/22 0518 05/25/22 0714  GLUCAP 137* 100* 104*    Assessment/Plan: NEURO- intact CV- remains in SR on amiodarone- convert amiodarone to PO  On Eliquis  BP improved with midodrine  Beta blocker on hold until Bp can tolerate RESP- IS RENAL- creatinine normal, AKI resolved  Volume overload- will start PO Lasix now that BP improved GI- Tolerating PO  Perc chole drain functioning well ID- on vanco for cellulitis RLE- slowly improving  Zosyn  for acute cholecystitis  Will complete 7 days IV antibiotics tomorrow  Will cover with another week of PO antibiotics ENDO- CBG Ok Deconditioning- continue PT   LOS: 7 days    Luis Deleon 05/25/2022

## 2022-05-25 NOTE — Progress Notes (Signed)
Physical Therapy Treatment Patient Details Name: Luis Deleon MRN: [id number] DOB: February 11, 1941 Today's Date: 05/25/2022   History of Present Illness Pt is a 81 y.o. male s/p recent CABG (05/07/22; d/c home 05/17/22), now admitted 8/4/823 with weakness, hypotension, SOB. Workup for R thigh cellulitis, small L pleural effusion. Rapid response 8/5 with hypotension and chest pain; transfer to ICU 8/6 for closer observation. Pt with RUQ pain; abdominal CT 8/7 with acute calculous cholecystitis. S/p cholecystostomy with drain placement 8/8. PMH includes CAD, CKD, HTN, arthritis, gout, Crohn disease, prostate CA.    PT Comments    Pt pleasant, able to state sternal precautions but requires min cues to maintain with transfers. Pt noted to have short shallow breaths throughout mobility with SPO2 briefly dropping to 89% on RA with gait and 98% on RA at rest. Pt with rapid shallow breaths with IS use barely achieving 500cc and educated for proper use with pt performing 10 reps without significant change in how he uses IS. Pt educated for continued gait performance and need for IS. Will continue to follow.     Recommendations for follow up therapy are one component of a multi-disciplinary discharge planning process, led by the attending physician.  Recommendations may be updated based on patient status, additional functional criteria and insurance authorization.  Follow Up Recommendations  Home health PT     Assistance Recommended at Discharge Intermittent Supervision/Assistance  Patient can return home with the following A little help with bathing/dressing/bathroom;Assistance with cooking/housework;Assist for transportation;Help with stairs or ramp for entrance   Equipment Recommendations  None recommended by PT    Recommendations for Other Services       Precautions / Restrictions Precautions Precautions: Sternal;Fall;Other (comment) Precaution Comments: R perc chole drain Restrictions Weight  Bearing Restrictions: No     Mobility  Bed Mobility Overal bed mobility: Needs Assistance Bed Mobility: Sit to Supine       Sit to supine: Mod assist   General bed mobility comments: mod assist to lift legs to surface, min assist to achieve midline. Pt has been sleeping in recliner recently    Transfers Overall transfer level: Needs assistance   Transfers: Sit to/from Stand Sit to Stand: Supervision           General transfer comment: supervision with cues not to push to scoot to edge of surface, pt able to rise from chair and bed without physical assist    Ambulation/Gait Ambulation/Gait assistance: Min guard Gait Distance (Feet): 140 Feet Assistive device: Rolling walker (2 wheels) Gait Pattern/deviations: Step-through pattern, Decreased stride length, Trunk flexed   Gait velocity interpretation: <1.8 ft/sec, indicate of risk for recurrent falls   General Gait Details: cues for posture and proximity to rW. Pt walked 140' then 120' with seated rest between trials   Stairs             Wheelchair Mobility    Modified Rankin (Stroke Patients Only)       Balance Overall balance assessment: Needs assistance Sitting-balance support: Feet supported, No upper extremity supported Sitting balance-Leahy Scale: Good     Standing balance support: Reliant on assistive device for balance Standing balance-Leahy Scale: Poor Standing balance comment: Rw for standing and gait                            Cognition Arousal/Alertness: Awake/alert Behavior During Therapy: WFL for tasks assessed/performed Overall Cognitive Status: Within Functional Limits for tasks assessed  General Comments: able to state sternal precautions but needs cue to attend to and maintain with mobility        Exercises      General Comments        Pertinent Vitals/Pain Pain Assessment Pain Score: 3  Pain Location: back Pain  Descriptors / Indicators: Aching Pain Intervention(s): Limited activity within patient's tolerance, Monitored during session, Repositioned    Home Living                          Prior Function            PT Goals (current goals can now be found in the care plan section) Progress towards PT goals: Progressing toward goals    Frequency    Min 3X/week      PT Plan Current plan remains appropriate    Co-evaluation              AM-PAC PT "6 Clicks" Mobility   Outcome Measure  Help needed turning from your back to your side while in a flat bed without using bedrails?: A Little Help needed moving from lying on your back to sitting on the side of a flat bed without using bedrails?: A Little Help needed moving to and from a bed to a chair (including a wheelchair)?: A Little Help needed standing up from a chair using your arms (e.g., wheelchair or bedside chair)?: A Little Help needed to walk in hospital room?: A Little Help needed climbing 3-5 steps with a railing? : A Little 6 Click Score: 18    End of Session Equipment Utilized During Treatment: Gait belt Activity Tolerance: Patient tolerated treatment well Patient left: in bed;with call bell/phone within reach Nurse Communication: Mobility status PT Visit Diagnosis: Other abnormalities of gait and mobility (R26.89);Muscle weakness (generalized) (M62.81)     Time: 2297-9892 PT Time Calculation (min) (ACUTE ONLY): 31 min  Charges:  $Gait Training: 8-22 mins $Therapeutic Activity: 8-22 mins                     Bayard Males, PT Acute Rehabilitation Services Office: [place] 05/25/2022, 10:39 AM

## 2022-05-26 ENCOUNTER — Inpatient Hospital Stay (HOSPITAL_COMMUNITY): Payer: Medicare HMO

## 2022-05-26 LAB — GLUCOSE, CAPILLARY
Glucose-Capillary: 104 mg/dL — ABNORMAL HIGH (ref 70–99)
Glucose-Capillary: 105 mg/dL — ABNORMAL HIGH (ref 70–99)
Glucose-Capillary: 114 mg/dL — ABNORMAL HIGH (ref 70–99)
Glucose-Capillary: 123 mg/dL — ABNORMAL HIGH (ref 70–99)

## 2022-05-26 MED ORDER — SODIUM CHLORIDE 0.9 % IV SOLN: 250.0000 mL | INTRAVENOUS | Status: DC | PRN

## 2022-05-26 MED ORDER — AMOXICILLIN-POT CLAVULANATE 875-125 MG PO TABS: 1.0000 | ORAL_TABLET | Freq: Two times a day (BID) | ORAL | Status: DC

## 2022-05-26 MED ORDER — MAGNESIUM HYDROXIDE 400 MG/5ML PO SUSP: 30.0000 mL | Freq: Every day | ORAL | Status: DC | PRN

## 2022-05-26 MED ORDER — SODIUM CHLORIDE 0.9% FLUSH
3.0000 mL | INTRAVENOUS | Status: DC | PRN
Start: 2022-05-26 — End: 2022-05-28

## 2022-05-26 MED ORDER — ALUM & MAG HYDROXIDE-SIMETH 200-200-20 MG/5ML PO SUSP: 15.0000 mL | Freq: Four times a day (QID) | ORAL | Status: DC | PRN

## 2022-05-26 MED ORDER — ATORVASTATIN CALCIUM 40 MG PO TABS: 40.0000 mg | ORAL_TABLET | Freq: Every day | ORAL | Status: DC

## 2022-05-26 MED ORDER — ~~LOC~~ CARDIAC SURGERY, PATIENT & FAMILY EDUCATION: Freq: Once | Status: AC

## 2022-05-26 MED ORDER — SODIUM CHLORIDE 0.9% FLUSH
3.0000 mL | Freq: Two times a day (BID) | INTRAVENOUS | Status: DC
  Administered 2022-05-26 – 2022-05-27 (×2): 3 mL via INTRAVENOUS

## 2022-05-26 MED ORDER — POTASSIUM CHLORIDE CRYS ER 20 MEQ PO TBCR
20.0000 meq | EXTENDED_RELEASE_TABLET | Freq: Two times a day (BID) | ORAL | Status: DC
  Administered 2022-05-26 – 2022-05-27 (×3): 20 meq via ORAL
  Filled 2022-05-26 (×3): qty 1

## 2022-05-26 MED ORDER — AMOXICILLIN-POT CLAVULANATE 875-125 MG PO TABS
1.0000 | ORAL_TABLET | Freq: Two times a day (BID) | ORAL | Status: DC
  Administered 2022-05-27 (×2): 1 via ORAL
  Filled 2022-05-26 (×3): qty 1

## 2022-05-26 NOTE — Progress Notes (Signed)
  Subjective: Patient resting comfortably back in sinus rhythm for several hours Gallbladder drain was 400 cc yesterday No more bleeding around the catheter site Ready for transfer to 4 E. progressive care  Objective: Vital signs in last 24 hours: Temp:  [97.4 F (36.3 C)-98.6 F (37 C)] 97.9 F (36.6 C) (08/12 0700) Pulse Rate:  [75-101] 77 (08/12 0700) Cardiac Rhythm: Normal sinus rhythm (08/11 2000) Resp:  [17-30] 19 (08/12 0700) BP: (98-126)/(53-83) 104/63 (08/12 0700) SpO2:  [74 %-99 %] 99 % (08/12 0700) Weight:  [110.5 kg] 110.5 kg (08/12 0500)  Hemodynamic parameters for last 24 hours:    Intake/Output from previous day: 08/11 0701 - 08/12 0700 In: 1062.7 [P.O.:480; I.V.:262.2; IV Piggyback:280.5] Out: 4097 [Urine:1300; Drains:480] Intake/Output this shift: No intake/output data recorded.  Exam  GEN comfortably soft supine position Normal sinus rhythm Abdomen soft nontender Bladder drain in place with some serosanguineous drainage Lungs clear  Lab Results: Recent Labs    06/04/2022 0520 05/25/22 0506  WBC 12.2* 8.4  HGB 11.0* 11.0*  HCT 33.2* 33.2*  PLT 335 340   BMET:  Recent Labs    06/10/2022 0520 05/25/22 0506  NA 133* 132*  K 3.4* 3.8  CL 103 103  CO2 22 21*  GLUCOSE 79 99  BUN 21 20  CREATININE 1.40* 1.17  CALCIUM 8.3* 8.4*    PT/INR:  Recent Labs    05/25/22 1744  LABPROT 17.5*  INR 1.5*   ABG    Component Value Date/Time   PHART 7.478 (H) 05/20/2022 0749   HCO3 23.9 05/20/2022 0749   TCO2 25 05/20/2022 0749   ACIDBASEDEF 6.0 (H) 05/07/2022 2302   O2SAT 95 05/20/2022 0749   CBG (last 3)  Recent Labs    05/25/22 2017 05/26/22 0011 05/26/22 0701  GLUCAP 99 123* 105*    Assessment/Plan: S/P  Patient remains in sinus rhythm afebrile with normal white count with gallbladder drain for acute cholecystitis continue Eliquis for A-fib Transition to oral Augmentin when IV antibiotic therapy ends Transfer to stepdown  LOS: 8  days    Dahlia Byes 05/26/2022

## 2022-05-27 ENCOUNTER — Inpatient Hospital Stay (HOSPITAL_COMMUNITY): Payer: Medicare HMO

## 2022-05-27 LAB — BASIC METABOLIC PANEL
Anion gap: 8 (ref 5–15)
BUN: 17 mg/dL (ref 8–23)
CO2: 23 mmol/L (ref 22–32)
Calcium: 8.1 mg/dL — ABNORMAL LOW (ref 8.9–10.3)
Chloride: 103 mmol/L (ref 98–111)
Creatinine, Ser: 1.25 mg/dL — ABNORMAL HIGH (ref 0.61–1.24)
GFR, Estimated: 59 mL/min — ABNORMAL LOW (ref >60)
Glucose, Bld: 101 mg/dL — ABNORMAL HIGH (ref 70–99)
Potassium: 3.6 mmol/L (ref 3.5–5.1)
Sodium: 134 mmol/L — ABNORMAL LOW (ref 135–145)

## 2022-05-27 LAB — CBC
HCT: 26.8 % — ABNORMAL LOW (ref 39.0–52.0)
Hemoglobin: 8.8 g/dL — ABNORMAL LOW (ref 13.0–17.0)
MCH: 31.3 pg (ref 26.0–34.0)
MCHC: 32.8 g/dL (ref 30.0–36.0)
MCV: 95.4 fL (ref 80.0–100.0)
Platelets: 387 10*3/uL (ref 150–400)
RBC: 2.81 MIL/uL — ABNORMAL LOW (ref 4.22–5.81)
RDW: 16.2 % — ABNORMAL HIGH (ref 11.5–15.5)
WBC: 13.1 10*3/uL — ABNORMAL HIGH (ref 4.0–10.5)
nRBC: 0 % (ref 0.0–0.2)

## 2022-05-27 LAB — AEROBIC/ANAEROBIC CULTURE W GRAM STAIN (SURGICAL/DEEP WOUND)
Culture: NO GROWTH
Gram Stain: NONE SEEN

## 2022-05-27 MED ORDER — AMIODARONE HCL IN DEXTROSE 360-4.14 MG/200ML-% IV SOLN
30.0000 mg/h | INTRAVENOUS | Status: DC
  Administered 2022-05-27: 30 mg/h via INTRAVENOUS
  Filled 2022-05-27: qty 200

## 2022-05-27 MED ORDER — IOHEXOL 350 MG/ML SOLN
100.0000 mL | Freq: Once | INTRAVENOUS | Status: AC | PRN
  Administered 2022-05-27: 100 mL via INTRAVENOUS

## 2022-05-27 MED ORDER — ENOXAPARIN SODIUM 60 MG/0.6ML IJ SOSY
50.0000 mg | PREFILLED_SYRINGE | INTRAMUSCULAR | Status: DC
  Filled 2022-05-27: qty 0.5

## 2022-05-27 MED ORDER — POTASSIUM CHLORIDE CRYS ER 20 MEQ PO TBCR
20.0000 meq | EXTENDED_RELEASE_TABLET | Freq: Once | ORAL | Status: AC
  Administered 2022-05-27: 20 meq via ORAL
  Filled 2022-05-27: qty 1

## 2022-05-27 NOTE — Progress Notes (Signed)
Referring Physician(s): Dr. Rosendo Gros  Supervising Physician: Aletta Edouard  Patient Status:  [facility] - In-pt  Chief Complaint:  Acute cholecystitis s/p cholecystostomy tube placement on 8/8 by Dr. Dwaine Gale - started bleeding around the drain on 8/11, also has bloody output in the gravity bag since placement.   Subjective:  Patient sitting in bed, uncomfortable due to prolonged sitting in the bed.  Denies abdominal pain, N/V.  Reports persistent shortness of breath at baseline, lightheadedness at times.    Allergies: Colchicine and Protamine  Medications: Prior to Admission medications   Medication Sig Start Date End Date Taking? Authorizing Provider  allopurinol (ZYLOPRIM) 100 MG tablet Take 1 tablet (100 mg total) by mouth 2 (two) times daily. 01/15/22  Yes Dettinger, Fransisca Kaufmann, MD  amiodarone (PACERONE) 200 MG tablet Take 1 tablet (200 mg total) by mouth daily. 05/17/22  Yes Barrett, Erin R, PA-C  amoxicillin-clavulanate (AUGMENTIN) 500-125 MG tablet Take 1 tablet (500 mg total) by mouth every 8 (eight) hours. 05/17/22  Yes Barrett, Erin R, PA-C  apixaban (ELIQUIS) 5 MG TABS tablet Take 1 tablet (5 mg total) by mouth 2 (two) times daily. 05/15/22  Yes Barrett, Erin R, PA-C  baclofen (LIORESAL) 10 MG tablet TAKE 1 TABLET BY MOUTH AT BEDTIME AS NEEDED FOR MUSCLE SPASMS. Patient taking differently: Take 10 mg by mouth at bedtime as needed for muscle spasms. 01/15/22  Yes Dettinger, Fransisca Kaufmann, MD  Cholecalciferol (VITAMIN D3) 125 MCG (5000 UT) CAPS Take 5,000 Units by mouth See admin instructions. Take 1 capsule (5000 units) by mouth every day except on Sundays.   Yes [provider]  gemfibrozil (LOPID) 600 MG tablet Take 1 tablet (600 mg total) by mouth 3 (three) times a week. Monday , Wednesday, Friday Patient taking differently: Take 600 mg by mouth every Monday, Wednesday, and Friday. 01/15/22  Yes Dettinger, Fransisca Kaufmann, MD  hydrochlorothiazide (HYDRODIURIL) 12.5 MG tablet Take 12.5 mg by  mouth daily.   Yes [provider]  losartan (COZAAR) 25 MG tablet Take 25 mg by mouth daily.   Yes [provider]  metoprolol tartrate (LOPRESSOR) 100 MG tablet Take 1 tablet (100 mg total) by mouth 2 (two) times daily. 05/15/22  Yes Barrett, Erin R, PA-C  nystatin-triamcinolone (MYCOLOG II) cream Apply 1 application topically 2 (two) times daily. Patient taking differently: Apply 1 application  topically 2 (two) times daily as needed (rash). 03/05/18  Yes Chipper Herb, MD  Omega-3 Fatty Acids (OMEGA-3 FISH OIL) 1200 MG CAPS Take 2,400 mg by mouth in the morning and at bedtime.   Yes [provider]  potassium chloride SA (KLOR-CON M20) 20 MEQ tablet Take 2 tablets (40 mEq total) by mouth daily. 10/02/21  Yes Dettinger, Fransisca Kaufmann, MD  traMADol (ULTRAM) 50 MG tablet Take 1 tablet (50 mg total) by mouth every 6 (six) hours as needed for moderate pain. 05/15/22  Yes Barrett, Erin R, PA-C  aspirin 81 MG EC tablet Take 81 mg by mouth every evening. Patient not taking: Reported on 06/12/2022    [provider]  atorvastatin (LIPITOR) 40 MG tablet TAKE 1 TABLET BY MOUTH DAILY AT 6 PM. Patient not taking: Reported on 06/01/2022 09/20/21   Dettinger, Fransisca Kaufmann, MD     Vital Signs: BP 101/68 (BP Location: Left Arm)   Pulse (!) 109   Temp (!) 97.4 F (36.3 C) (Oral)   Resp 20   Ht 5' 8"  (1.727 m)   Wt 243 lb 9.7 oz (  110.5 kg)   SpO2 97%   BMI 37.04 kg/m   Physical Exam Vitals reviewed.  Constitutional:      General: He is not in acute distress.    Appearance: Normal appearance.  HENT:     Head: Normocephalic.  Pulmonary:     Effort: Pulmonary effort is normal. No respiratory distress.  Abdominal:     Palpations: Abdomen is soft.  Skin:    General: Skin is warm and dry.     Comments: Positive RUQ drain to gravity bag. Dressing is saturated in blood, active bleeding around th drain noted. Dressing changed. Bloody output in the gravity bag as well. Drain flushes  and aspirated well.   Neurological:     Mental Status: He is alert and oriented to person, place, and time.  Psychiatric:        Mood and Affect: Mood normal.        Behavior: Behavior normal.        Thought Content: Thought content normal.        Judgment: Judgment normal.     Imaging: DG Chest 2 View  Result Date: 05/26/2022 CLINICAL DATA:  Difficulty breathing EXAM: CHEST - 2 VIEW COMPARISON:  Previous studies including the examination of 06/13/2022 FINDINGS: Transverse diameter of heart is increased. 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 Increased density in left lower lung field may suggest pleural effusion and possibly underlying atelectasis. There is minimal blunting of right lateral CP angle. There is no pneumothorax. Tip of right PICC line is seen in superior vena cava. IMPRESSION: Cardiomegaly.  There are no signs of pulmonary edema. Increased density in left lower lung field may be due to small pleural effusion and possibly underlying atelectasis/pneumonia. There is minimal right pleural effusion. Electronically Signed   By: Elmer Picker M.D.   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 EXAM: PORTABLE CHEST 1 VIEW COMPARISON:  05/22/2022 FINDINGS: Status post median sternotomy and CABG. RIGHT sided PICC line tip overlies the superior vena cava. Heart is enlarged but stable in configuration. There is persistent opacity at the LEFT lung base, obscuring the LEFT hemidiaphragm and similar to prior studies. No pneumothorax. No pulmonary edema. IMPRESSION: Similar appearance of LEFT LOWER lobe opacity.  Stable cardiomegaly. Electronically Signed   By: Nolon Nations M.D.   On: 05/17/2022 08:10    Labs:  CBC: Recent Labs    06/07/2022 0520 05/25/22 0506 05/27/22 0500 05/27/22 0850  WBC 12.2* 8.4 QUESTIONABLE IDENTIFICATION / INCORRECTLY LABELED SPECIMEN 13.1*  HGB 11.0* 11.0* QUESTIONABLE  IDENTIFICATION / INCORRECTLY LABELED SPECIMEN 8.8*  HCT 33.2* 33.2* QUESTIONABLE IDENTIFICATION / INCORRECTLY LABELED SPECIMEN 26.8*  PLT 335 340 QUESTIONABLE IDENTIFICATION / INCORRECTLY LABELED SPECIMEN 387    COAGS: Recent Labs    05/03/22 0857 05/07/22 1336 05/22/22 0757 05/25/22 1744  INR 1.0 1.4* 2.1* 1.5*  APTT 30 32  --   --     BMP: Recent Labs    05/23/22 0425 06/10/2022 0520 05/25/22 0506 05/27/22 0850  NA 134* 133* 132* 134*  K 3.4* 3.4* 3.8 3.6  CL 102 103 103 103  CO2 23 22 21* 23  GLUCOSE 94 79 99 101*  BUN 21 21 20 17   CALCIUM 8.2* 8.3* 8.4* 8.1*  CREATININE 1.47* 1.40* 1.17 1.25*  GFRNONAA 49* 51* >60 59*    LIVER FUNCTION TESTS: Recent Labs    05/03/22 0857 05/21/22 0354 05/22/22  0424 05/23/22 0425  BILITOT 2.8* 4.1* 4.3* 3.3*  AST 28 106* 71* 62*  ALT 20 61* 53* 41  ALKPHOS 46 104 93 93  PROT 6.1* 5.7* 5.4* 5.4*  ALBUMIN 3.1* 2.4* 2.2* 1.9*    Assessment and Plan:   Acute cholecystitis with calculus s/p cholecystostomy tube placement with Dr. Dwaine Gale 05/22/22.  Patient has had bloody output in the gravity bag, and he started bleeding around the drain on 8/11.  8/11: PA called to the bedside for eval, no active bleeding even with manipulation noted. Rechecked INR as it was elevated at the time of placement (2.1) Recheck INR was 1.5, no intervention needed.  8/12: chart checked only, no further bleeding from cardiology note. Hgb stable 8/13: drop in hgb from 11.0 to 8.8. Dressing saturated in blood, active bleeding around the drain also noted. Patient reports SOB at baseline, lightheadedness at times. Became tachycardic. Discussed with Dr. Kathlene Cote, STAT CTA A/P ordered.   Drain Location: RUQ Size: Fr size: 10 Fr Date of placement: 8/8  Currently to: Drain collection device: gravity 24 hour output:  Output by Drain (mL) 05/25/22 0701 - 05/25/22 1900 05/25/22 1901 - 05/26/22 0700 05/26/22 0701 - 05/26/22 1900 05/26/22 1901 - 05/27/22 0700  05/27/22 0701 - 05/27/22 0942  Biliary Tube Cook slip-coat 10.2 Fr. RUQ 170 310 350 400 130    Interval imaging/drain manipulation:  8/13: STAT CTA A/P ordered.   Current examination: Flushes/aspirates easily.  Insertion site with excessive, active bleeding.  Suture and stat lock in place. Dressed appropriately.   Plan:  Will review CTA A/P once done.   Continue TID flushes with 5 cc NS. Record output Q shift. Dressing changes QD or PRN if soiled.  Call IR APP or on call IR MD if difficulty flushing or sudden change in drain output.  Repeat imaging/possible drain injection once output < 10 mL/QD (excluding flush material). Consideration for drain removal if output is < 10 mL/QD (excluding flush material), pending discussion with the providing surgical service.  Discharge planning: Please contact IR APP or on call IR MD prior to patient d/c to ensure appropriate follow up plans are in place. Typically patient will follow up with IR clinic 6-8 weeks post d/c for repeat imaging/possible drain injection. IR scheduler will contact patient with date/time of appointment. Patient will need to flush drain QD with 5 cc NS, record output QD, dressing changes every 2-3 days or earlier if soiled.   IR will continue to follow - please call with questions or concerns.    Electronically Signed: Tera Mater, PA-C 05/27/2022, 9:42 AM   I spent a total of 15 Minutes at the the patient's bedside AND on the patient's hospital floor or unit, greater than 50% of which was counseling/coordinating care for acute cholecystitis.

## 2022-05-27 NOTE — Progress Notes (Signed)
PHARMACIST - PHYSICIAN COMMUNICATION  CONCERNING:  Enoxaparin (Lovenox) for DVT Prophylaxis    RECOMMENDATION: Patient was prescribed enoxaprin 47m q24 hours for VTE prophylaxis.   Filed Weights   05/20/2022 0500 05/25/22 0500 05/26/22 0500  Weight: 108 kg (238 lb 1.6 oz) 110.6 kg (243 lb 13.3 oz) 110.5 kg (243 lb 9.7 oz)    Body mass index is 37.04 kg/m.  Estimated Creatinine Clearance: 58.7 mL/min (A) (by C-G formula based on SCr of 1.25 mg/dL (H)).   Based on CMalintapatient is candidate for enoxaparin 0.562mkg TBW SQ every 24 hours based on BMI being >30  DESCRIPTION: [facility]  Patient is now receiving enoxaparin 50 mg every 24 hours    SuBerta MinorPharmD Clinical Pharmacist  05/27/2022 12:09 PM

## 2022-05-27 NOTE — Progress Notes (Addendum)
[address]       [place] [address]             [phone]         Subjective: Feels poorly , generalized and sacral /buttock discomfort from immobility- did walk some yesterday  Objective: Vital signs in last 24 hours: Temp:  [97.4 F (36.3 C)-98.6 F (37 C)] 97.4 F (36.3 C) (08/13 0820) Pulse Rate:  [77-112] 109 (08/13 0830) Cardiac Rhythm: Normal sinus rhythm;Atrial fibrillation;Other (Comment) (08/13 0405) Resp:  [16-29] 20 (08/13 0820) BP: (97-121)/(55-71) 101/68 (08/13 0820) SpO2:  [94 %-100 %] 97 % (08/13 0820)  Hemodynamic parameters for last 24 hours:    Intake/Output from previous day: 08/12 0701 - 08/13 0700 In: 288.8 [IV Piggyback:228.8] Out: 2000 [Urine:1250; Drains:750] Intake/Output this shift: Total I/O In: 20 [I.V.:20] Out: 130 [Drains:130]  General appearance: alert, cooperative, distracted, fatigued, and no distress Heart: irregularly irregular rhythm Lungs: mildly dim in bases Abdomen: soft, non-tender, drain with some drainage on dressing, bilious Extremities: + pitting edema- echymosis and induration mid thigh, minimal erethema Wound: incis healing well  Lab Results: Recent Labs    05/25/22 0506 05/27/22 0500  WBC 8.4 10.7*  HGB 11.0* 8.6*  HCT 33.2* 25.6*  PLT 340 338   BMET:  Recent Labs    05/25/22 0506  NA 132*  K 3.8  CL 103  CO2 21*  GLUCOSE 99  BUN 20  CREATININE 1.17  CALCIUM 8.4*    PT/INR:  Recent Labs    05/25/22 1744  LABPROT 17.5*  INR 1.5*   ABG    Component Value Date/Time   PHART 7.478 (H) 05/20/2022 0749   HCO3 23.9 05/20/2022 0749   TCO2 25 05/20/2022 0749   ACIDBASEDEF 6.0 (H) 05/07/2022 2302   O2SAT 95 05/20/2022 0749   CBG (last 3)  Recent Labs    05/26/22 0701 05/26/22 1140 05/26/22 1557  GLUCAP 105* 114* 104*    Meds Scheduled Meds:  (feeding supplement) PROSource Plus  30 mL Oral BID WC   allopurinol  100 mg Oral Daily   amiodarone  200 mg Oral BID    amoxicillin-clavulanate  1 tablet Oral Q12H   apixaban  5 mg Oral BID   aspirin EC  81 mg Oral Daily   atorvastatin  40 mg Oral Daily   Chlorhexidine Gluconate Cloth  6 each Topical Daily   docusate sodium  100 mg Oral BID   feeding supplement  237 mL Oral TID BM   furosemide  40 mg Oral Daily   midodrine  5 mg Oral TID WC   multivitamin with minerals  1 tablet Oral Daily   pantoprazole  40 mg Oral Q1200   potassium chloride  20 mEq Oral BID   sodium chloride flush  10-40 mL Intracatheter Q12H   sodium chloride flush  3 mL Intravenous Q12H   sodium chloride flush  5 mL Intracatheter Q8H   zinc sulfate  220 mg Oral Daily   Continuous Infusions:  sodium chloride     PRN Meds:.sodium chloride, acetaminophen **OR** acetaminophen, alum & mag hydroxide-simeth, baclofen, magnesium hydroxide, ondansetron **OR** ondansetron (ZOFRAN) IV, mouth rinse, oxyCODONE, polyethylene glycol, sodium chloride flush, sodium chloride flush  Xrays DG Chest 2 View  Result Date: 05/26/2022 CLINICAL DATA:  Difficulty breathing EXAM: CHEST - 2 VIEW COMPARISON:  Previous studies including the examination of 06/10/2022 FINDINGS: Transverse diameter of heart is increased. There is evidence of previous coronary bypass  surgery. Increased density in left lower lung field may suggest pleural effusion and possibly underlying atelectasis. There is minimal blunting of right lateral CP angle. There is no pneumothorax. Tip of right PICC line is seen in superior vena cava. IMPRESSION: Cardiomegaly.  There are no signs of pulmonary edema. Increased density in left lower lung field may be due to small pleural effusion and possibly underlying atelectasis/pneumonia. There is minimal right pleural effusion. Electronically Signed   By: Elmer Picker M.D.   On: 05/26/2022 20:07    Assessment/Plan:  1 afeb, VSS,  afib, some RVR, PVC's , some runs of WCT- on amio/eliquis, holding beta blocker while on midodrine/low relative BP 2  sats good on RA but currently on 4 liters 3 gall bladder drain 750cc, IR and gen surgery plans as outlined- current abx 4 Good uop 5 bmet pending 6 no leukocytosis 7 H/H has dropped from 11/33 on 8/11  to 8.6/ 25.6 today- did have some recent bleeding assoc with tube placement and elev INR 8 slow but steady improvement right thigh cellulitis,echymosis 9 push rehab/mobility as able    LOS: 9 days    John Giovanni PA-C Pager [phone] 05/27/2022   Problem with bleeding around the gallbladder drain and bloody drainage as well.  Hemoglobin has dropped from 11 g to 9 g over the past 3 days. Patient had a repeat CT scan showing a drain to be in good position.  Radiology has the drain and covered within the dressing to help stop further bleeding.\ Eliquis has been stopped and the patient resume a prophylactic dose of Lovenox tomorrow.  Repeat CBC in AM.  Type and screen renewed.  Patient is in a controlled atrial fib with stable blood pressure No abdominal pain or tenderness on exam  Back in atrial fibrillation with potassium 3.6. Will give extra potassium and start low-dose IV amiodarone and continue oral dosing.   patient examined and medical record reviewed,agree with above note. Dahlia Byes 05/27/2022

## 2022-05-27 NOTE — Progress Notes (Signed)
CTA reviewed by Dr. Kathlene Cote, no bleeding source evident by CTA and tube in good position. No hematoma.   Recommendations: - Cap the drain as GB decompressed to see if it will minimize bleeding.  - Stop flushing - monitor hgb and CMP   Drain was capped at bedside, informed RN to attach the drain to a gravity bag if patient develops abd pain, n/v, fever, chills.  She verbalized understanding.   IR will continue to follow.   Chalmers Iddings H Lindora Alviar PA-C 05/27/2022 1:00 PM

## 2022-05-28 LAB — GLUCOSE, CAPILLARY: Glucose-Capillary: 114 mg/dL — ABNORMAL HIGH (ref 70–99)

## 2022-05-28 LAB — COMPREHENSIVE METABOLIC PANEL
ALT: 126 U/L — ABNORMAL HIGH (ref 0–44)
AST: 166 U/L — ABNORMAL HIGH (ref 15–41)
Albumin: 1.8 g/dL — ABNORMAL LOW (ref 3.5–5.0)
Alkaline Phosphatase: 68 U/L (ref 38–126)
Anion gap: 20 — ABNORMAL HIGH (ref 5–15)
BUN: 25 mg/dL — ABNORMAL HIGH (ref 8–23)
CO2: 12 mmol/L — ABNORMAL LOW (ref 22–32)
Calcium: 8.5 mg/dL — ABNORMAL LOW (ref 8.9–10.3)
Chloride: 102 mmol/L (ref 98–111)
Creatinine, Ser: 2.04 mg/dL — ABNORMAL HIGH (ref 0.61–1.24)
GFR, Estimated: 33 mL/min — ABNORMAL LOW (ref >60)
Glucose, Bld: 160 mg/dL — ABNORMAL HIGH (ref 70–99)
Potassium: 4.9 mmol/L (ref 3.5–5.1)
Sodium: 134 mmol/L — ABNORMAL LOW (ref 135–145)
Total Bilirubin: 1.6 mg/dL — ABNORMAL HIGH (ref 0.3–1.2)
Total Protein: 4.9 g/dL — ABNORMAL LOW (ref 6.5–8.1)

## 2022-05-28 LAB — CBC
HCT: 23.3 % — ABNORMAL LOW (ref 39.0–52.0)
Hemoglobin: 7.4 g/dL — ABNORMAL LOW (ref 13.0–17.0)
MCH: 31.4 pg (ref 26.0–34.0)
MCHC: 31.8 g/dL (ref 30.0–36.0)
MCV: 98.7 fL (ref 80.0–100.0)
Platelets: 528 10*3/uL — ABNORMAL HIGH (ref 150–400)
RBC: 2.36 MIL/uL — ABNORMAL LOW (ref 4.22–5.81)
RDW: 17.1 % — ABNORMAL HIGH (ref 11.5–15.5)
WBC: 21.9 10*3/uL — ABNORMAL HIGH (ref 4.0–10.5)
nRBC: 0.9 % — ABNORMAL HIGH (ref 0.0–0.2)

## 2022-05-28 LAB — MAGNESIUM: Magnesium: 2.3 mg/dL (ref 1.7–2.4)

## 2022-05-28 MED ORDER — SODIUM CHLORIDE 0.9% IV SOLUTION: Freq: Once | INTRAVENOUS | Status: DC

## 2022-05-30 LAB — ZINC: Zinc: 64 ug/dL (ref 44–115)

## 2022-05-30 LAB — VITAMIN C: Vitamin C: 0.5 mg/dL (ref 0.4–2.0)

## 2022-06-05 ENCOUNTER — Ambulatory Visit: Payer: Medicare HMO

## 2022-06-08 ENCOUNTER — Ambulatory Visit: Payer: Medicare HMO | Admitting: General Practice

## 2022-06-15 NOTE — Code Documentation (Signed)
  Patient Name: Luis Deleon   MRN: [id number]   Date of Birth/ Sex: 11/17/1940 , male      Admission Date: 05/21/2022  Attending Provider: Melrose Nakayama, MD  Primary Diagnosis: Cellulitis of right leg [L03.115] AKI (acute kidney injury) ([place]) [N17.9] Hypotension, unspecified hypotension type [I95.9]   Indication: Pt was in his usual state of health until this AM, when he was noted to be asystole on telemetry and was found unresponsive. Code blue was subsequently called. At the time of arrival on scene, ACLS protocol was underway.   Technical Description:  - CPR performance duration:  23 minutes ROSC was obtained however patient coded again while in the ICU and CPR was continued for 26 min  - Was defibrillation or cardioversion used? No   - Was external pacer placed? No  - Was patient intubated pre/post CPR? Yes   Medications Administered: Y = Yes; Blank = No Amiodarone    Atropine  Y  Calcium  Y  Epinephrine  Y  Lidocaine    Magnesium    Norepinephrine  Y  Phenylephrine    Sodium bicarbonate  Y  Vasopressin    Other    Post CPR evaluation:  - Final Status - Was patient successfully resuscitated ? No   Miscellaneous Information:  - Time of death:  341 AM  - Primary team notified?  In the process of trying to reach team as of this note.   - Family Notified? Yes     Rick Duff, MD   06/08/2022, 3:59 AM

## 2022-06-15 NOTE — Death Summary Note (Addendum)
[address]       [place] [address]             [phone]    Physician Discharge Summary  Patient ID: Luis Deleon MRN: [id number] DOB/AGE: Sep 14, 1941 81 y.o.  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   Hyperlipidemia 07/28/2008   Gout, unspecified 07/28/2008   HTN (hypertension) 07/28/2008   Crohn disease ([place]) 07/28/2008   DIVERTICULOSIS, COLON 07/28/2008   History of prostate cancer 07/28/2008   SMALL BOWEL OBSTRUCTION, HX OF 07/28/2008   NEPHROLITHIASIS, HX OF 07/28/2008   Discharge Diagnoses:  Acute cholecystitis with sepsis Patient Active Problem List   Diagnosis Date Noted   Cellulitis of right leg 05/27/2022   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   CAD (coronary artery disease) 04/23/2022   Bilateral lower extremity edema 03/02/2022   PSVT (paroxysmal supraventricular tachycardia) ([place]) 05/20/2020   Atypical chest pain 12/19/2017   Abdominal aortic atherosclerosis ([place]) 58/59/2924   Metabolic syndrome 46/28/6381   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   Hyperlipidemia 07/28/2008   Gout, unspecified 07/28/2008   HTN (hypertension) 07/28/2008   Crohn disease ([place]) 07/28/2008   DIVERTICULOSIS, COLON 07/28/2008   History of prostate cancer 07/28/2008   SMALL BOWEL OBSTRUCTION, HX OF 07/28/2008   NEPHROLITHIASIS, HX OF 07/28/2008   Discharged Condition: deceased  History of Present Illness:    Luis Deleon is a 81 year old gentleman with a past history  of hypertension, dyslipidemia, Crohn's disease, prostate cancer status post radical prostatectomy, stage III chronic kidney disease, and remote history of tobacco abuse.  He was recently discovered to have coronary artery disease and underwent CABG x3 by Dr. Roxan Hockey on 05/07/2022.  His postoperative course was significant for atrial fibrillation that was treated with amiodarone and initially converted to normal sinus rhythm.  He went on to have a few more episodes of atrial fibrillation.  After consultation with cardiology, the decision was made to start him on Eliquis.  He otherwise made a progressive and satisfactory recovery.  He developed a cellulitis in his right arm apparently associated with an IV infiltration.  This was initially treated with Keflex.  By the time of discharge, he continued to have the induration and was noted to have slight increase in his white blood count.  It was decided to transition to oral Augmentin prior to discharge. Home health nursing was arranged prior to discharge and on the first nurse visit this morning, the patient was noted to have systolic blood pressure in the 60s and heart rate in the high 50s.  He felt poorly in general with weakness and shortness of breath.  He was advised to come to the emergency room.  The patient's daughter, on further inquiry, reported that she had given him all of his usual home medications this morning which included the Dilaudid, losartan, and hydrochlorothiazide which had been discontinued on the discharge instructions. Work-up in the emergency room has included CBC showing white blood count increasing further to 19,000, lactic acid of 2.0, and creatinine 2.0.  Chest x-ray shows expected postoperative changes with no significant pleural effusion or infiltrates.  CT scan of the abdomen and pelvis to rule out exacerbation of his Crohn's disease was unrevealing.  Blood cultures were obtained with results pending at this time. He denies having  any fever since his discharge home yesterday.  He said he was feeling well, was walking around, and was eating okay until after taking his medications this morning. Since admission to the emergency room this morning, he has received 2 L of IV fluid.  His blood pressure has improved to around 100/60.  Heart rate is 63.  He is awake and comfortable resting on the ED stretcher.  He reports having some chest soreness but denies any chest pain.  He also denies any pain in his arm at the site of IV infiltration.  He does report pain in his right thigh over the vein harvest tunnel.  He said he first noticed this while in the hospital that it has become more intense since his discharge yesterday morning.  Hospital Course: Luis Deleon was admitted to [facility].  He was started on empiric IV vancomycin along with his discharge medications for atrial fibrillation.  By the second hospital day, his white blood count had decreased from 19,000-15,000.  The erythema over the right medial thigh was gradually improving but the induration persisted.  On 05/20/2022, he complained of abdominal discomfort which he described as severe at times.  He was in atrial fibrillation with a rate in the 120s at that time.  He was transferred to the ICU for closer observation.  Chest x-ray showed small left pleural effusion with mild left basilar atelectasis which was essentially unchanged.  A KUB was obtained that showed air throughout the intestine with no evidence of obstruction.  ABG was unremarkable with the base dose of 1.  His white blood count had increased to 22,000.  Blood cultures from admission remained negative.  Antibiotic coverage was with the addition of Zosyn to vancomycin.  He was kept NPO and started on IV fluids.  His abdominal pain improved but did not resolve.  General surgery consult was obtained repeat CT scan which showed gallstone with cholecystitis.  They recommended IR placement of percutaneous chole tube with  cholecystectomy 6-8 weeks down the road.  He required transfusion of FFP prior to procedure due to elevated INR of 2.1.  A tube has been placed by IR and he has had some difficulty with bloody drainage and hemoglobin/hematocrit has shown a steady decrease.  A CTA of the chest has been ordered for 05/27/2022 and the results showed no evidence arterial injury.  Normally positioned cholecystectomy tube with decompressed gallbladder.  He was transitioned off IV amiodarone on 8/9.  His diet was advanced as tolerated.  He was hypotensive and started on Neo-synephrine for BP support.  He required initiation of Midodrine for BP support.  He has completed a 7 day course of IV antibiotics and will receive an additional 7 days of oral antibiotics.  The patient had some bleeding around gallbladder this resolved without further difficulty.  He was felt stable for transfer to the progressive care unit on 05/26/2022.  The patient overall did not feel great.  He was resumed on his Eliquis.  He was hypokalemic and supplemented accordingly.  He was experiencing some episodes of wide complex tachycardia which was not new for him.  The patient unfortunately developed cardiac arrest the morning of 8/14.  Initially ROSC was obtained, he was  intubated and transferred to the SICU.  He again suffered an additional cardiac arrest and unfortunately was unable to be resuscitated and passed away.     Consults: general surgery and IR  Significant Diagnostic Studies: radiology: CT scan:    Gallbladder stone. Gallbladder is distended along with wall thickening and pericholecystic stranding. Possibility of acute cholecystitis is not excluded. Follow-up gallbladder sonogram should be considered.   There is no evidence of intestinal obstruction or pneumoperitoneum. There is no hydronephrosis.   Small bilateral pleural effusions with interval increase. Small to moderate pericardial effusion. There are patchy infiltrates with  air bronchogram in the lower lung fields suggesting atelectasis/pneumonia with interval worsening.   Severe coronary artery disease. Diverticulosis of colon without signs of focal diverticulitis.   Bilateral renal cysts with no significant interval change. No follow-up studies are recommended.   Lumbar spondylosis. Other findings as described in the body of the report.   These results will be called to the ordering clinician or representative by the Radiologist Assistant, and communication documented in the PACS or [employer].    Electronically Signed   By: Elmer Picker M.D.   On: 05/21/2022 14:04   Treatments:   Signed                      Interventional Radiology Procedure Note   Procedure: Cholecystostomy (10.2 fr)   Indication: Acute cholecystitis   Findings: Please refer to procedural dictation for full description.   Complications: None   EBL: < 10 mL   Miachel Roux, MD [phone]       Signed:  Ellwood Handler, PA-C  05/29/2022, 11:54 AM

## 2022-06-15 NOTE — Progress Notes (Signed)
   2022-06-20 0300  Clinical Encounter Type  Visited With Patient not available;Family  Visit Type Initial;Code;Critical Care  Referral From Nurse   [place] responded to [place] on 4E at approx. 2:40am; when [facility] arrived medical team were attending to pt., performing CPR.  [place] contacted pt.'s dtr. [name] and coordinated update from providers running the code; pt. initially regained a pulse and was transported to [facility] for additional support but shortly after arriving went into cardiac arrest again; pt. did not regain pulse despite several rounds of CPR.  [place] remained present at awaited family's arrival to [facility].  [place] provided support to [facility] and her husband when they arrived to see pt.

## 2022-06-15 DEATH — deceased

## 2022-06-17 MED FILL — Medication: Qty: 1 | Status: AC

## 2022-07-18 ENCOUNTER — Ambulatory Visit: Payer: Medicare HMO | Admitting: Family Medicine
